# Patient Record
Sex: Female | Born: 1945
Health system: Southern US, Community
[De-identification: ages and names within clinical notes are randomized; demographics above are authoritative.]

## PROBLEM LIST (undated history)

## (undated) DIAGNOSIS — M1990 Unspecified osteoarthritis, unspecified site: Secondary | ICD-10-CM

## (undated) DIAGNOSIS — J449 Chronic obstructive pulmonary disease, unspecified: Secondary | ICD-10-CM

## (undated) DIAGNOSIS — F0150 Vascular dementia without behavioral disturbance: Secondary | ICD-10-CM

## (undated) DIAGNOSIS — I720 Aneurysm of carotid artery: Secondary | ICD-10-CM

## (undated) DIAGNOSIS — K219 Gastro-esophageal reflux disease without esophagitis: Secondary | ICD-10-CM

## (undated) DIAGNOSIS — F32A Depression, unspecified: Secondary | ICD-10-CM

## (undated) DIAGNOSIS — I639 Cerebral infarction, unspecified: Secondary | ICD-10-CM

## (undated) DIAGNOSIS — I10 Essential (primary) hypertension: Secondary | ICD-10-CM

## (undated) DIAGNOSIS — F329 Major depressive disorder, single episode, unspecified: Secondary | ICD-10-CM

## (undated) HISTORY — DX: Unspecified osteoarthritis, unspecified site: M19.90

## (undated) HISTORY — DX: Gastro-esophageal reflux disease without esophagitis: K21.9

## (undated) HISTORY — DX: Aneurysm of carotid artery: I72.0

## (undated) HISTORY — DX: Chronic obstructive pulmonary disease, unspecified: J44.9

## (undated) HISTORY — DX: Cerebral infarction, unspecified: I63.9

## (undated) HISTORY — PX: CATARACT EXTRACTION: SUR2

## (undated) HISTORY — PX: TUBAL LIGATION: SHX77

## (undated) HISTORY — PX: TONSILLECTOMY: SUR1361

## (undated) HISTORY — DX: Essential (primary) hypertension: I10

---

## 1997-05-21 ENCOUNTER — Ambulatory Visit (HOSPITAL_COMMUNITY): Admission: RE | Admit: 1997-05-21 | Discharge: 1997-05-21 | Payer: Self-pay | Admitting: *Deleted

## 1998-06-08 ENCOUNTER — Ambulatory Visit (HOSPITAL_COMMUNITY): Admission: RE | Admit: 1998-06-08 | Discharge: 1998-06-08 | Payer: Self-pay | Admitting: *Deleted

## 1998-11-07 ENCOUNTER — Ambulatory Visit (HOSPITAL_COMMUNITY): Admission: RE | Admit: 1998-11-07 | Discharge: 1998-11-07 | Payer: Self-pay | Admitting: Internal Medicine

## 1998-11-07 ENCOUNTER — Encounter: Payer: Self-pay | Admitting: Internal Medicine

## 1998-11-21 ENCOUNTER — Encounter: Payer: Self-pay | Admitting: Gastroenterology

## 1998-11-21 ENCOUNTER — Ambulatory Visit (HOSPITAL_COMMUNITY): Admission: RE | Admit: 1998-11-21 | Discharge: 1998-11-21 | Payer: Self-pay | Admitting: Gastroenterology

## 1998-12-02 ENCOUNTER — Encounter (INDEPENDENT_AMBULATORY_CARE_PROVIDER_SITE_OTHER): Payer: Self-pay | Admitting: Specialist

## 1998-12-02 ENCOUNTER — Ambulatory Visit (HOSPITAL_COMMUNITY): Admission: RE | Admit: 1998-12-02 | Discharge: 1998-12-02 | Payer: Self-pay | Admitting: Gastroenterology

## 1998-12-08 ENCOUNTER — Ambulatory Visit (HOSPITAL_COMMUNITY): Admission: RE | Admit: 1998-12-08 | Discharge: 1998-12-08 | Payer: Self-pay | Admitting: Gastroenterology

## 1998-12-08 ENCOUNTER — Encounter: Payer: Self-pay | Admitting: Gastroenterology

## 1998-12-20 ENCOUNTER — Encounter: Payer: Self-pay | Admitting: Gastroenterology

## 1998-12-20 ENCOUNTER — Ambulatory Visit (HOSPITAL_COMMUNITY): Admission: RE | Admit: 1998-12-20 | Discharge: 1998-12-20 | Payer: Self-pay | Admitting: Gastroenterology

## 1999-10-02 ENCOUNTER — Ambulatory Visit (HOSPITAL_COMMUNITY): Admission: RE | Admit: 1999-10-02 | Discharge: 1999-10-02 | Payer: Self-pay | Admitting: Gastroenterology

## 1999-10-02 ENCOUNTER — Encounter (INDEPENDENT_AMBULATORY_CARE_PROVIDER_SITE_OTHER): Payer: Self-pay

## 2000-04-01 ENCOUNTER — Inpatient Hospital Stay (HOSPITAL_COMMUNITY): Admission: EM | Admit: 2000-04-01 | Discharge: 2000-04-11 | Payer: Self-pay | Admitting: Psychiatry

## 2002-01-06 ENCOUNTER — Encounter: Payer: Self-pay | Admitting: Internal Medicine

## 2002-01-06 ENCOUNTER — Ambulatory Visit (HOSPITAL_COMMUNITY): Admission: RE | Admit: 2002-01-06 | Discharge: 2002-01-06 | Payer: Self-pay | Admitting: Internal Medicine

## 2002-03-02 ENCOUNTER — Ambulatory Visit (HOSPITAL_COMMUNITY): Admission: RE | Admit: 2002-03-02 | Discharge: 2002-03-02 | Payer: Self-pay | Admitting: Internal Medicine

## 2002-03-02 ENCOUNTER — Encounter: Payer: Self-pay | Admitting: Internal Medicine

## 2002-03-14 ENCOUNTER — Emergency Department (HOSPITAL_COMMUNITY): Admission: EM | Admit: 2002-03-14 | Discharge: 2002-03-14 | Payer: Self-pay | Admitting: Emergency Medicine

## 2002-03-14 ENCOUNTER — Encounter: Payer: Self-pay | Admitting: *Deleted

## 2002-03-26 HISTORY — PX: COLONOSCOPY: SHX174

## 2002-04-07 ENCOUNTER — Encounter (INDEPENDENT_AMBULATORY_CARE_PROVIDER_SITE_OTHER): Payer: Self-pay | Admitting: Cardiology

## 2002-04-07 ENCOUNTER — Ambulatory Visit: Admission: RE | Admit: 2002-04-07 | Discharge: 2002-04-07 | Payer: Self-pay | Admitting: Neurology

## 2002-05-26 ENCOUNTER — Inpatient Hospital Stay (HOSPITAL_COMMUNITY): Admission: EM | Admit: 2002-05-26 | Discharge: 2002-05-28 | Payer: Self-pay | Admitting: Emergency Medicine

## 2002-05-26 ENCOUNTER — Encounter: Payer: Self-pay | Admitting: *Deleted

## 2002-05-26 ENCOUNTER — Encounter: Payer: Self-pay | Admitting: Neurology

## 2002-06-02 ENCOUNTER — Encounter: Admission: RE | Admit: 2002-06-02 | Discharge: 2002-08-31 | Payer: Self-pay | Admitting: Neurology

## 2003-06-10 ENCOUNTER — Encounter: Admission: RE | Admit: 2003-06-10 | Discharge: 2003-09-08 | Payer: Self-pay | Admitting: Psychiatry

## 2004-10-30 ENCOUNTER — Ambulatory Visit (HOSPITAL_COMMUNITY): Admission: RE | Admit: 2004-10-30 | Discharge: 2004-10-30 | Payer: Self-pay | Admitting: Internal Medicine

## 2005-01-12 ENCOUNTER — Emergency Department (HOSPITAL_COMMUNITY): Admission: EM | Admit: 2005-01-12 | Discharge: 2005-01-12 | Payer: Self-pay | Admitting: Family Medicine

## 2005-06-17 ENCOUNTER — Encounter: Admission: RE | Admit: 2005-06-17 | Discharge: 2005-06-17 | Payer: Self-pay | Admitting: Neurosurgery

## 2005-06-20 ENCOUNTER — Ambulatory Visit (HOSPITAL_COMMUNITY): Admission: RE | Admit: 2005-06-20 | Discharge: 2005-06-20 | Payer: Self-pay | Admitting: Internal Medicine

## 2005-09-14 ENCOUNTER — Encounter: Admission: RE | Admit: 2005-09-14 | Discharge: 2005-09-14 | Payer: Self-pay | Admitting: Neurosurgery

## 2005-10-11 ENCOUNTER — Encounter
Admission: RE | Admit: 2005-10-11 | Discharge: 2005-11-01 | Payer: Self-pay | Admitting: Physical Medicine and Rehabilitation

## 2005-11-30 ENCOUNTER — Encounter: Admission: RE | Admit: 2005-11-30 | Discharge: 2005-12-07 | Payer: Self-pay | Admitting: Oncology

## 2005-12-03 ENCOUNTER — Encounter
Admission: RE | Admit: 2005-12-03 | Discharge: 2005-12-03 | Payer: Self-pay | Admitting: Physical Medicine and Rehabilitation

## 2006-02-08 ENCOUNTER — Ambulatory Visit (HOSPITAL_COMMUNITY): Admission: RE | Admit: 2006-02-08 | Discharge: 2006-02-08 | Payer: Self-pay | Admitting: Internal Medicine

## 2006-03-25 ENCOUNTER — Ambulatory Visit (HOSPITAL_COMMUNITY): Admission: RE | Admit: 2006-03-25 | Discharge: 2006-03-25 | Payer: Self-pay | Admitting: Internal Medicine

## 2006-06-18 ENCOUNTER — Ambulatory Visit (HOSPITAL_COMMUNITY): Admission: RE | Admit: 2006-06-18 | Discharge: 2006-06-18 | Payer: Self-pay | Admitting: Internal Medicine

## 2006-07-31 ENCOUNTER — Encounter
Admission: RE | Admit: 2006-07-31 | Discharge: 2006-07-31 | Payer: Self-pay | Admitting: Physical Medicine and Rehabilitation

## 2006-08-02 ENCOUNTER — Encounter
Admission: RE | Admit: 2006-08-02 | Discharge: 2006-08-02 | Payer: Self-pay | Admitting: Physical Medicine and Rehabilitation

## 2006-09-07 ENCOUNTER — Emergency Department (HOSPITAL_COMMUNITY): Admission: EM | Admit: 2006-09-07 | Discharge: 2006-09-07 | Payer: Self-pay | Admitting: *Deleted

## 2006-10-26 ENCOUNTER — Ambulatory Visit: Payer: Self-pay | Admitting: Internal Medicine

## 2006-10-26 ENCOUNTER — Inpatient Hospital Stay (HOSPITAL_COMMUNITY): Admission: EM | Admit: 2006-10-26 | Discharge: 2006-10-28 | Payer: Self-pay | Admitting: Emergency Medicine

## 2006-11-04 ENCOUNTER — Ambulatory Visit: Payer: Self-pay | Admitting: Pulmonary Disease

## 2006-11-05 ENCOUNTER — Ambulatory Visit: Payer: Self-pay

## 2006-12-05 ENCOUNTER — Ambulatory Visit: Payer: Self-pay | Admitting: Pulmonary Disease

## 2007-04-07 ENCOUNTER — Ambulatory Visit (HOSPITAL_COMMUNITY): Admission: RE | Admit: 2007-04-07 | Discharge: 2007-04-07 | Payer: Self-pay | Admitting: Internal Medicine

## 2007-07-18 ENCOUNTER — Ambulatory Visit: Payer: Self-pay | Admitting: Vascular Surgery

## 2007-07-18 ENCOUNTER — Ambulatory Visit: Admission: RE | Admit: 2007-07-18 | Discharge: 2007-07-18 | Payer: Self-pay | Admitting: Orthopaedic Surgery

## 2007-07-18 ENCOUNTER — Encounter (INDEPENDENT_AMBULATORY_CARE_PROVIDER_SITE_OTHER): Payer: Self-pay | Admitting: Internal Medicine

## 2008-01-08 ENCOUNTER — Encounter: Admission: RE | Admit: 2008-01-08 | Discharge: 2008-01-08 | Payer: Self-pay | Admitting: Neurology

## 2008-02-12 ENCOUNTER — Ambulatory Visit (HOSPITAL_COMMUNITY): Admission: RE | Admit: 2008-02-12 | Discharge: 2008-02-12 | Payer: Self-pay | Admitting: Internal Medicine

## 2008-03-03 ENCOUNTER — Ambulatory Visit (HOSPITAL_COMMUNITY): Admission: RE | Admit: 2008-03-03 | Discharge: 2008-03-03 | Payer: Self-pay | Admitting: Internal Medicine

## 2008-03-08 ENCOUNTER — Other Ambulatory Visit: Admission: RE | Admit: 2008-03-08 | Discharge: 2008-03-08 | Payer: Self-pay | Admitting: Internal Medicine

## 2008-04-05 ENCOUNTER — Ambulatory Visit (HOSPITAL_COMMUNITY): Admission: RE | Admit: 2008-04-05 | Discharge: 2008-04-05 | Payer: Self-pay | Admitting: Neurosurgery

## 2009-02-16 ENCOUNTER — Other Ambulatory Visit: Admission: RE | Admit: 2009-02-16 | Discharge: 2009-02-16 | Payer: Self-pay | Admitting: Internal Medicine

## 2009-07-21 ENCOUNTER — Encounter (HOSPITAL_BASED_OUTPATIENT_CLINIC_OR_DEPARTMENT_OTHER): Admission: RE | Admit: 2009-07-21 | Discharge: 2009-09-23 | Payer: Self-pay | Admitting: Internal Medicine

## 2009-07-29 ENCOUNTER — Ambulatory Visit: Payer: Self-pay | Admitting: Vascular Surgery

## 2009-08-02 ENCOUNTER — Ambulatory Visit (HOSPITAL_COMMUNITY): Admission: RE | Admit: 2009-08-02 | Discharge: 2009-08-02 | Payer: Self-pay | Admitting: Internal Medicine

## 2009-08-26 ENCOUNTER — Ambulatory Visit (HOSPITAL_COMMUNITY): Admission: RE | Admit: 2009-08-26 | Discharge: 2009-08-26 | Payer: Self-pay | Admitting: Internal Medicine

## 2009-09-19 ENCOUNTER — Ambulatory Visit (HOSPITAL_COMMUNITY): Admission: RE | Admit: 2009-09-19 | Discharge: 2009-09-19 | Payer: Self-pay | Admitting: Internal Medicine

## 2009-10-24 ENCOUNTER — Encounter (HOSPITAL_BASED_OUTPATIENT_CLINIC_OR_DEPARTMENT_OTHER): Admission: RE | Admit: 2009-10-24 | Discharge: 2010-01-22 | Payer: Self-pay | Admitting: General Surgery

## 2009-10-27 ENCOUNTER — Ambulatory Visit (HOSPITAL_COMMUNITY): Admission: RE | Admit: 2009-10-27 | Discharge: 2009-10-27 | Payer: Self-pay | Admitting: Internal Medicine

## 2009-12-28 ENCOUNTER — Ambulatory Visit (HOSPITAL_COMMUNITY): Admission: RE | Admit: 2009-12-28 | Discharge: 2009-12-28 | Payer: Self-pay | Admitting: Internal Medicine

## 2010-02-07 ENCOUNTER — Encounter (HOSPITAL_BASED_OUTPATIENT_CLINIC_OR_DEPARTMENT_OTHER)
Admission: RE | Admit: 2010-02-07 | Discharge: 2010-04-25 | Payer: Self-pay | Source: Home / Self Care | Attending: General Surgery | Admitting: General Surgery

## 2010-02-10 ENCOUNTER — Ambulatory Visit (HOSPITAL_COMMUNITY): Admission: RE | Admit: 2010-02-10 | Discharge: 2010-02-10 | Payer: Self-pay | Admitting: General Surgery

## 2010-02-24 ENCOUNTER — Ambulatory Visit (HOSPITAL_COMMUNITY)
Admission: RE | Admit: 2010-02-24 | Discharge: 2010-02-24 | Payer: Self-pay | Source: Home / Self Care | Admitting: General Surgery

## 2010-03-01 ENCOUNTER — Inpatient Hospital Stay (HOSPITAL_COMMUNITY): Admission: EM | Admit: 2010-03-01 | Discharge: 2010-03-05 | Payer: Self-pay | Source: Home / Self Care

## 2010-03-09 ENCOUNTER — Inpatient Hospital Stay (HOSPITAL_COMMUNITY)
Admission: EM | Admit: 2010-03-09 | Discharge: 2010-03-11 | Payer: Self-pay | Source: Home / Self Care | Attending: Internal Medicine | Admitting: Internal Medicine

## 2010-03-10 ENCOUNTER — Encounter (INDEPENDENT_AMBULATORY_CARE_PROVIDER_SITE_OTHER): Payer: Self-pay | Admitting: Internal Medicine

## 2010-03-14 ENCOUNTER — Ambulatory Visit (HOSPITAL_COMMUNITY): Admission: RE | Admit: 2010-03-14 | Payer: Self-pay | Source: Home / Self Care | Admitting: Orthopedic Surgery

## 2010-04-05 ENCOUNTER — Encounter
Admission: RE | Admit: 2010-04-05 | Discharge: 2010-04-25 | Payer: Self-pay | Source: Home / Self Care | Attending: Physical Medicine & Rehabilitation | Admitting: Physical Medicine & Rehabilitation

## 2010-04-07 ENCOUNTER — Ambulatory Visit: Admit: 2010-04-07 | Payer: Self-pay | Admitting: Physical Medicine & Rehabilitation

## 2010-04-17 ENCOUNTER — Encounter: Payer: Self-pay | Admitting: Neurology

## 2010-04-28 ENCOUNTER — Encounter (HOSPITAL_BASED_OUTPATIENT_CLINIC_OR_DEPARTMENT_OTHER): Payer: Medicare Other | Attending: General Surgery

## 2010-04-28 DIAGNOSIS — L97309 Non-pressure chronic ulcer of unspecified ankle with unspecified severity: Secondary | ICD-10-CM | POA: Insufficient documentation

## 2010-05-26 ENCOUNTER — Encounter (HOSPITAL_BASED_OUTPATIENT_CLINIC_OR_DEPARTMENT_OTHER): Payer: Medicare Other | Attending: General Surgery

## 2010-05-26 DIAGNOSIS — L97309 Non-pressure chronic ulcer of unspecified ankle with unspecified severity: Secondary | ICD-10-CM | POA: Insufficient documentation

## 2010-06-05 LAB — DIFFERENTIAL
Basophils Absolute: 0.1 10*3/uL (ref 0.0–0.1)
Basophils Relative: 0 % (ref 0–1)
Basophils Relative: 1 % (ref 0–1)
Eosinophils Absolute: 0.2 10*3/uL (ref 0.0–0.7)
Eosinophils Absolute: 0.4 10*3/uL (ref 0.0–0.7)
Eosinophils Relative: 1 % (ref 0–5)
Eosinophils Relative: 5 % (ref 0–5)
Eosinophils Relative: 4 % (ref 0–5)
Lymphocytes Relative: 30 % (ref 12–46)
Lymphocytes Relative: 29 % (ref 12–46)
Lymphs Abs: 1.4 10*3/uL (ref 0.7–4.0)
Monocytes Absolute: 1.3 10*3/uL — ABNORMAL HIGH (ref 0.1–1.0)
Monocytes Absolute: 1.6 10*3/uL — ABNORMAL HIGH (ref 0.1–1.0)
Monocytes Relative: 11 % (ref 3–12)
Monocytes Relative: 14 % — ABNORMAL HIGH (ref 3–12)
Neutro Abs: 4.7 10*3/uL (ref 1.7–7.7)
Neutrophils Relative %: 79 % — ABNORMAL HIGH (ref 43–77)

## 2010-06-05 LAB — BASIC METABOLIC PANEL
BUN: 3 mg/dL — ABNORMAL LOW (ref 6–23)
BUN: <1 mg/dL — ABNORMAL LOW (ref 6–23)
CO2: 29 mEq/L (ref 19–32)
Calcium: 8 mg/dL — ABNORMAL LOW (ref 8.4–10.5)
Calcium: 8 mg/dL — ABNORMAL LOW (ref 8.4–10.5)
Chloride: 104 mEq/L (ref 96–112)
Chloride: 102 mEq/L (ref 96–112)
Creatinine, Ser: 0.59 mg/dL (ref 0.4–1.2)
Creatinine, Ser: 0.66 mg/dL (ref 0.4–1.2)
GFR calc Af Amer: >60 mL/min (ref >60)
GFR calc non Af Amer: 22 mL/min — ABNORMAL LOW (ref >60)
GFR calc non Af Amer: >60 mL/min (ref >60)
Glucose, Bld: 94 mg/dL (ref 70–99)
Glucose, Bld: 98 mg/dL (ref 70–99)
Potassium: 3.2 mEq/L — ABNORMAL LOW (ref 3.5–5.1)
Sodium: 135 mEq/L (ref 135–145)
Sodium: 134 mEq/L — ABNORMAL LOW (ref 135–145)

## 2010-06-05 LAB — CK TOTAL AND CKMB (NOT AT ARMC)
Relative Index: INVALID (ref 0.0–2.5)
Total CK: 86 U/L (ref 7–177)

## 2010-06-05 LAB — CBC
HCT: 27.6 % — ABNORMAL LOW (ref 36.0–46.0)
HCT: 29.2 % — ABNORMAL LOW (ref 36.0–46.0)
Hemoglobin: 9.5 g/dL — ABNORMAL LOW (ref 12.0–15.0)
Hemoglobin: 12.1 g/dL (ref 12.0–15.0)
MCH: 31.5 pg (ref 26.0–34.0)
MCH: 31 pg (ref 26.0–34.0)
MCH: 31.3 pg (ref 26.0–34.0)
MCH: 31.5 pg (ref 26.0–34.0)
MCHC: 34.4 g/dL (ref 30.0–36.0)
MCHC: 34.5 g/dL (ref 30.0–36.0)
MCHC: 35.3 g/dL (ref 30.0–36.0)
MCHC: 34.6 g/dL (ref 30.0–36.0)
MCHC: 34.8 g/dL (ref 30.0–36.0)
MCV: 90.2 fL (ref 78.0–100.0)
MCV: 91.1 fL (ref 78.0–100.0)
MCV: 88.9 fL (ref 78.0–100.0)
MCV: 89.7 fL (ref 78.0–100.0)
MCV: 89.7 fL (ref 78.0–100.0)
Platelets: 380 10*3/uL (ref 150–400)
Platelets: 399 10*3/uL (ref 150–400)
Platelets: 404 10*3/uL — ABNORMAL HIGH (ref 150–400)
Platelets: 410 10*3/uL — ABNORMAL HIGH (ref 150–400)
Platelets: 430 10*3/uL — ABNORMAL HIGH (ref 150–400)
RBC: 3.02 MIL/uL — ABNORMAL LOW (ref 3.87–5.11)
RBC: 3.65 MIL/uL — ABNORMAL LOW (ref 3.87–5.11)
RBC: 3.78 MIL/uL — ABNORMAL LOW (ref 3.87–5.11)
RBC: 3.87 MIL/uL (ref 3.87–5.11)
RDW: 13.5 % (ref 11.5–15.5)
RDW: 14.1 % (ref 11.5–15.5)
RDW: 13.2 % (ref 11.5–15.5)
RDW: 13.3 % (ref 11.5–15.5)
RDW: 13.4 % (ref 11.5–15.5)
WBC: 14.7 10*3/uL — ABNORMAL HIGH (ref 4.0–10.5)
WBC: 9.1 10*3/uL (ref 4.0–10.5)
WBC: 10.3 10*3/uL (ref 4.0–10.5)
WBC: 6.5 10*3/uL (ref 4.0–10.5)

## 2010-06-05 LAB — CARDIAC PANEL(CRET KIN+CKTOT+MB+TROPI)
CK, MB: 5.9 ng/mL — ABNORMAL HIGH (ref 0.3–4.0)
CK, MB: 3.8 ng/mL (ref 0.3–4.0)
CK, MB: 5.3 ng/mL — ABNORMAL HIGH (ref 0.3–4.0)
Relative Index: 3.5 — ABNORMAL HIGH (ref 0.0–2.5)
Relative Index: INVALID (ref 0.0–2.5)
Total CK: 132 U/L (ref 7–177)
Total CK: 170 U/L (ref 7–177)
Troponin I: 0.02 ng/mL (ref 0.00–0.06)
Troponin I: 0.02 ng/mL (ref 0.00–0.06)

## 2010-06-05 LAB — URINALYSIS, ROUTINE W REFLEX MICROSCOPIC
Bilirubin Urine: NEGATIVE
Bilirubin Urine: NEGATIVE
Bilirubin Urine: NEGATIVE
Glucose, UA: NEGATIVE mg/dL
Glucose, UA: NEGATIVE mg/dL
Hgb urine dipstick: NEGATIVE
Hgb urine dipstick: NEGATIVE
Ketones, ur: 15 mg/dL — AB
Ketones, ur: NEGATIVE mg/dL
Nitrite: NEGATIVE
Protein, ur: NEGATIVE mg/dL
Protein, ur: NEGATIVE mg/dL
Protein, ur: NEGATIVE mg/dL
Urobilinogen, UA: 0.2 mg/dL (ref 0.0–1.0)
Urobilinogen, UA: 0.2 mg/dL (ref 0.0–1.0)

## 2010-06-05 LAB — LIPID PANEL
Cholesterol: 104 mg/dL (ref 0–200)
HDL: 65 mg/dL (ref >39)
LDL Cholesterol: 27 mg/dL (ref 0–99)
Total CHOL/HDL Ratio: 1.6 RATIO

## 2010-06-05 LAB — COMPREHENSIVE METABOLIC PANEL
ALT: 13 U/L (ref 0–35)
ALT: 10 U/L (ref 0–35)
AST: 20 U/L (ref 0–37)
AST: 23 U/L (ref 0–37)
AST: 19 U/L (ref 0–37)
AST: 24 U/L (ref 0–37)
Albumin: 2.4 g/dL — ABNORMAL LOW (ref 3.5–5.2)
Albumin: 3.2 g/dL — ABNORMAL LOW (ref 3.5–5.2)
Alkaline Phosphatase: 64 U/L (ref 39–117)
BUN: 7 mg/dL (ref 6–23)
CO2: 21 mEq/L (ref 19–32)
Calcium: 7.9 mg/dL — ABNORMAL LOW (ref 8.4–10.5)
Calcium: 8.9 mg/dL (ref 8.4–10.5)
Chloride: 94 mEq/L — ABNORMAL LOW (ref 96–112)
Creatinine, Ser: 1.91 mg/dL — ABNORMAL HIGH (ref 0.4–1.2)
Creatinine, Ser: 0.77 mg/dL (ref 0.4–1.2)
Creatinine, Ser: 1.28 mg/dL — ABNORMAL HIGH (ref 0.4–1.2)
GFR calc Af Amer: 25 mL/min — ABNORMAL LOW (ref >60)
GFR calc Af Amer: 32 mL/min — ABNORMAL LOW (ref >60)
GFR calc Af Amer: 50 mL/min — ABNORMAL LOW (ref >60)
GFR calc non Af Amer: 42 mL/min — ABNORMAL LOW (ref >60)
GFR calc non Af Amer: >60 mL/min (ref >60)
Glucose, Bld: 114 mg/dL — ABNORMAL HIGH (ref 70–99)
Potassium: 3.7 mEq/L (ref 3.5–5.1)
Potassium: 4.1 mEq/L (ref 3.5–5.1)
Sodium: 133 mEq/L — ABNORMAL LOW (ref 135–145)
Sodium: 135 mEq/L (ref 135–145)
Total Bilirubin: 0.4 mg/dL (ref 0.3–1.2)
Total Bilirubin: 0.5 mg/dL (ref 0.3–1.2)
Total Protein: 5.7 g/dL — ABNORMAL LOW (ref 6.0–8.3)

## 2010-06-05 LAB — POCT I-STAT, CHEM 8
Calcium, Ion: 1.02 mmol/L — ABNORMAL LOW (ref 1.12–1.32)
Glucose, Bld: 91 mg/dL (ref 70–99)
HCT: 38 % (ref 36.0–46.0)
Hemoglobin: 12.9 g/dL (ref 12.0–15.0)
Potassium: 3.4 mEq/L — ABNORMAL LOW (ref 3.5–5.1)
TCO2: 23 mmol/L (ref 0–100)

## 2010-06-05 LAB — MAGNESIUM
Magnesium: 1.8 mg/dL (ref 1.5–2.5)
Magnesium: 2.2 mg/dL (ref 1.5–2.5)

## 2010-06-05 LAB — URINE MICROSCOPIC-ADD ON

## 2010-06-05 LAB — LIPASE, BLOOD: Lipase: 17 U/L (ref 11–59)

## 2010-06-05 LAB — T4, FREE: Free T4: 0.73 ng/dL — ABNORMAL LOW (ref 0.80–1.80)

## 2010-06-05 LAB — GLUCOSE, CAPILLARY: Glucose-Capillary: 112 mg/dL — ABNORMAL HIGH (ref 70–99)

## 2010-06-05 LAB — PHOSPHORUS: Phosphorus: 3.7 mg/dL (ref 2.3–4.6)

## 2010-06-23 ENCOUNTER — Ambulatory Visit: Payer: Medicare Other | Admitting: Physical Medicine & Rehabilitation

## 2010-06-23 ENCOUNTER — Encounter: Payer: Medicare Other | Attending: Physical Medicine & Rehabilitation

## 2010-06-23 DIAGNOSIS — M479 Spondylosis, unspecified: Secondary | ICD-10-CM | POA: Insufficient documentation

## 2010-06-23 DIAGNOSIS — M47814 Spondylosis without myelopathy or radiculopathy, thoracic region: Secondary | ICD-10-CM

## 2010-06-23 DIAGNOSIS — F3289 Other specified depressive episodes: Secondary | ICD-10-CM | POA: Insufficient documentation

## 2010-06-23 DIAGNOSIS — M5124 Other intervertebral disc displacement, thoracic region: Secondary | ICD-10-CM | POA: Insufficient documentation

## 2010-06-23 DIAGNOSIS — M5380 Other specified dorsopathies, site unspecified: Secondary | ICD-10-CM | POA: Insufficient documentation

## 2010-06-23 DIAGNOSIS — M546 Pain in thoracic spine: Secondary | ICD-10-CM

## 2010-06-23 DIAGNOSIS — F329 Major depressive disorder, single episode, unspecified: Secondary | ICD-10-CM | POA: Insufficient documentation

## 2010-06-23 DIAGNOSIS — M549 Dorsalgia, unspecified: Secondary | ICD-10-CM | POA: Insufficient documentation

## 2010-06-23 DIAGNOSIS — Z79899 Other long term (current) drug therapy: Secondary | ICD-10-CM | POA: Insufficient documentation

## 2010-06-26 ENCOUNTER — Encounter (HOSPITAL_BASED_OUTPATIENT_CLINIC_OR_DEPARTMENT_OTHER): Payer: Medicare Other | Attending: General Surgery

## 2010-06-26 DIAGNOSIS — L97309 Non-pressure chronic ulcer of unspecified ankle with unspecified severity: Secondary | ICD-10-CM | POA: Insufficient documentation

## 2010-08-08 NOTE — H&P (Signed)
NAME:  [name], [name] NO.:  [id number]   MEDICAL RECORD NO.:  [id number]          Bol[place] TYPE:  EMS   LOCATION:  MAJO                         FACILITY:  [facility]   PHYSICIAN:  Marcellus Scott, MD     DATE OF BIRTH:  12/18/1945   DATE OF ADMISSION:  10/26/2006  DATE OF DISCHARGE:                              HISTORY & PHYSICAL   PRIMARY MEDICAL DOCTOR:  Lovenia Kim, D.O.   NEUROLOGISTMarlan Palau, M.D.   PSYCHIATRIST:  Andee Poles, M.D.   CHIEF COMPLAINT:  Chest pain.   HISTORY OF PRESENTING ILLNESS:  [name] is a pleasant 65 year old  Caucasian female Bolton with a past medical history as indicated below.  She was in her usual state of health until approximately 5 a.m. today  when chest pain woke her up.  The chest pain was fairly sudden in onset.  It is like a band across the lower chest with a pressure-like sensation.  She says it gives her a feeling of a gorilla sitting on her chest.  The  pain was 9/10 at its worst with radiation to the left side of her neck  and the left upper extremity.  The pain was also worsened by taking a  deep breath.  She had associated dyspnea and a sense of doom.  There was  no history of coughing, fever, nausea, vomiting, dizziness,  lightheadedness.  She took some tablet with no real improvement.  She  subsequently after a few hours when the pain persisted decided to call  EMS, was brought to the emergency room.  She has received IV morphine  x2.  She has received a full dose of aspirin en route to the hospital.  She says only the last 30 minutes the pain has subsided to a 6/10 but  still has ongoing chest pain.   PAST MEDICAL HISTORY:  1. Left-sided cerebrovascular accident/infarct in 2004 with no      residual deficits.  2. Hypertension.  3. Chronic obstructive pulmonary disease.  4. Gastroesophageal reflux disease.  5. Chronic low back pain.   PAST SURGICAL HISTORY:  None.   PSYCHIATRIC HISTORY:  1.  Depression.  2. Anxiety.   ALLERGIES:  The Bolton has multiple allergies:   1. AMOXICILLIN.  2. CEPHALEXIN.  3. ERYTHROMYCIN BASE.  4. LIDOCAINE.  5. PENICILLIN.  6. THIAZIDES.  7. ZITHROMAX.  8. SULFA.   CURRENT MEDICATIONS:  1. Accupril 40 mg p.o. daily.  2. Amlodipine 10 mg p.o. daily.  3. Enteric-coated aspirin 81 mg p.o. daily.  4. Boniva one tablet p.o. monthly.  5. Gabapentin 200 mg p.o. q.h.s.  6. Vicodin 7.5/500 mg one p.o. q.6h.  7. Lorazepam 0.5 mg p.o. q.i.d.  8. Plavix 75 mg p.o. daily.  9. Prevacid 30 mg p.o. daily.  10.Vesicare 10 mg p.o. daily.  11.Remeron 30 mg p.o. q.h.s.  12.Seroquel 300 mg p.o. q.h.s.   FAMILY HISTORY:  1. Mother died at 50 of a heart attack.  She also had cancer of the      lung.  2. Father died at  age 51 from alcohol and related complications,      including pancreatitis.  3. A sister at age 85 years is alive and was diagnosed with breast      cancer 6 months ago.   SOCIAL HISTORY:  The Bolton is widowed.  She works as a Corporate investment banker.  She is a current smoker of 2-3 cigarettes per day but prior  to two months she used to be a more heavy smoker.  She has done this for  decades.  Socially drinks alcohol.  No drug abuse.   REVIEW OF SYSTEMS:  There were 10 systems reviewed and noncontributory.   PHYSICAL EXAMINATION:  [name] is a moderately-built and nourished  female Bolton in obvious distress.  VITAL SIGNS:  Temperature 98.4 degrees Fahrenheit, blood pressure  105/54, which was 84/48 on arrival, pulse 106 per minute, respirations  40 per minute.  Saturating at 98% on room air.  HEAD, EYE, ENT:  Bilateral cataracts.  Nontraumatic, normocephalic.  Pupils equally reacting to light and accommodation.  Oral cavity with  missing teeth but no oropharyngeal erythema or thrush.  NECK:  Without JVD, carotid bruit, lymphadenopathy or goiter.  Supple.  LYMPHATIC:  No lymphadenopathy.  RESPIRATORY SYSTEM:  Clear to  auscultation.  No chest wall tenderness.  CARDIOVASCULAR:  First and second heart sounds are heard.  No third or  fourth heart sounds.  No murmurs, rubs, gallops or clicks.  ABDOMEN:  Nondistended, nontender, no organomegaly or mass appreciated.  Bowel sounds are normally heard.  CENTRAL NERVOUS SYSTEM:  The Bolton is awake, alert and oriented x3  with no focal neurologic deficits.  EXTREMITIES:  Peripheral pulses symmetric.  There is no cyanosis,  clubbing or edema.  Peripheries are warm.  SKIN:  Without any rashes.   LAB RESULTS:  Point of care cardiac markers x2 negative.  D-dimer  negative at 0.45.  An I-STAT with pH of 7.406, pCO2 36.8, bicarb 28,  hemoglobin 14.3, hematocrit 42, sodium 133, potassium 3.8, chloride 103,  glucose of 161, BUN of 8.   EKG with sinus tachycardia at 104 beats per minute.  Otherwise no acute  changes.   A chest x-ray which has been removed by me and reported as no acute  cardiopulmonary findings.   ASSESSMENT AND PLAN:  1. Chest pain/unstable angina in a middle-aged female Bolton with      risk factors of hypertension, current smoking, prior      cerebrovascular accident, and a family history positive for      myocardial infarction.  We will admit the Bolton to a step-down      unit.  We will cycle the Bolton's cardiac enzymes.  We will place      the Bolton on oxygen and continue aspirin and Plavix, IV      nitroglycerin drip and unfractionated heparin.  Will use beta      blockers if blood pressure tolerates.  Cardiology has been      consulted.  2. Hypertension.  She had low-ish blood pressure on arrival.  We will      hold her Norvasc at this time and continue other medications.  3. History of cerebrovascular accident.  Will continue aspirin and      Plavix.  4. Chronic obstructive pulmonary disease, stable.  5. Tobacco abuse.  We will obtain a tobacco cessation consult.      Bolton not desirous of nicotine patch.      Marcellus Scott, MD  Electronically  Signed     AH/MEDQ  D:  10/26/2006  T:  10/26/2006  Job:  [id number]   cc:   Lovenia Kim, D.O.  Marlan Palau, M.D.  Andee Poles, M.D.

## 2010-08-08 NOTE — Consult Note (Signed)
[name], [name] NO.:  [id number]   MEDICAL RECORD NO.:  [id number]          PATIENT TYPE:  INP   LOCATION:  4708                         FACILITY:  [facility]   PHYSICIAN:  Doylene Canning. Ladona Ridgel, MD    DATE OF BIRTH:  Nov 21, 1945   DATE OF CONSULTATION:  10/26/2006  DATE OF DISCHARGE:                                 CONSULTATION   REQUESTING PHYSICIAN:  Dr. Donnetta Hutching.   INDICATION FOR CONSULTATION:  Evaluation of chest pain.   HISTORY OF PRESENT ILLNESS:  The patient is a 65 year old woman with  multiple medical problems including hypertension, a history of a stroke,  depression, anxiety and a history of gastroesophageal reflux disease.  Her history dates back approximately 1 month ago when she had  unexplained nausea, which eventually resolved.  The etiology of this was  unknown.  The patient states that she woke up this morning with  substernal chest pain and shortness of breath.  There was no nausea.  There is no vomiting.  There was radiation of pain to the left neck.  It  did not go to the arm or the shoulder.  She described her pain as being  like a band around my chest, but she subsequently called EMS and was  given nitroglycerin which did not help her symptoms.  Ultimately,  morphine and Zofran, in conjunction with nitroglycerin in the emergency  room, reduced her pain.  She states that the pain was worse with deep  inspiration and with certain movements, specifically if she moved  forward.  There was nothing that she could do, to make her pain  relieved.  She is admitted for additional evaluation.  She has had no  syncope.  She denies palpitations.   PAST MEDICAL HISTORY:  Is notable for hypertension, stroke and  gastroesophageal reflux disease, depression and a history of  hemorrhoids.  She also had a history of migraine headaches.   MEDICATIONS:  Include amlodipine 10 a day, aspirin 81 a day, Boniva,  gabapentin 100, two tablets in the evening;  hydrocodone, Lorazepam,  Mirtazapine, Plavix 75 a day, Prevacid 30, quinapril 40, Seroquel and  Vesicare.   FAMILY HISTORY:  Notable for mother dying of coronary disease in her 76s  and father dying of complications of alcohol and pancreatitis.   SOCIAL HISTORY:  The patient is widowed and a smoker.  She has stopped  smoking for awhile but now has started back.  She does note that she  drinks one alcoholic beverage per day on average.   REVIEW OF SYSTEMS:  The rest of review of systems was notable for  anxiety, as previously noted with depression.  She has chronic back  pain.  She has chronic reflux symptoms.  She has had vision loss.   PHYSICAL EXAMINATION:  GENERAL:  On the physical exam, she is a pleasant  middle-aged woman in no acute distress.  VITAL SIGNS:  The temperature was 101.4, blood pressure was 90/50, pulse  was 81 and regular, the respirations were 24.  The patient was on well-  appearing in middle-aged woman  in no acute distress.  HEENT:  Normocephalic, atraumatic.  Pupils equal, round.  Oropharynx was  moist.  Sclerae anicteric.  The patient did wear glasses.  NECK:  There  is a very soft bruit on the right, otherwise the neck exam was normal.  CARDIAC:  Revealed a regular rate and rhythm with normal S1-S2.  There  are no murmurs, rubs, gallops appreciated.  PMI was not enlarged, nor  was it laterally displaced.  LUNGS:  Clear bilaterally auscultation, except for rales in bases  bilaterally.  SKIN:  Exam was normal.  NEUROLOGIC:  Alert and oriented x3, cranial nerves intact.  The strength  is 5/5 and symmetric.  I could not detect any weakness in her right  hand.   LABORATORY:  EKG demonstrates sinus tachycardia with nonspecific T-wave  abnormality.  Hemoglobin was 14.  The rest of her labs were normal.  Her  point care markers were negative.  Her D-dimer was 0.45.  EKG  demonstrates sinus rhythm with no acute ST-T wave abnormalities.   IMPRESSION:  1. Atypical  chest pain with all ongoing symptoms.  He had no EKG or      enzyme abnormalities.  2. Hypertension.  3. History of stroke.  4. Chronic obstructive pulmonary disease.  5. Tobacco abuse.   DISCUSSION:  While the patient has multiple cardiac risk factors, her  symptoms are very atypical, and she has continued have pain with no EKG  changes or enzyme abnormalities, making coronary ischemia very unlikely.  For now, I recommended that she be given morphine and other narcotics as  needed, as well as NSAIDs to help control her pain.  Outpatient stress  test will be a consideration, unless her cardiac enzymes were positive.  Also, would consider ABG make sure she is not having evidence of  hypoxemia.  Will plan to watch the patient with you on telemetry.      Doylene Canning. Ladona Ridgel, MD  Electronically Signed     GWT/MEDQ  D:  10/26/2006  T:  10/27/2006  Job:  [id number]   cc:   Lovenia Kim, D.O.

## 2010-08-08 NOTE — Discharge Summary (Signed)
[name], [name] NO.:  [id number]   MEDICAL RECORD NO.:  [id number]          PATIENT TYPE:  INP   LOCATION:  4708                         FACILITY:  [facility]   PHYSICIAN:  Lonia Blood, M.D.       DATE OF BIRTH:  Feb 05, 1946   DATE OF ADMISSION:  10/26/2006  DATE OF DISCHARGE:  10/28/2006                               DISCHARGE SUMMARY   PRIMARY CARE PHYSICIAN:  Dr. Marisue Brooklyn.   DISCHARGE DIAGNOSES:  1. Chest pain - unclear etiology - resolving.  2. Hypertension.  3. Minor stroke,  4. Gastroesophageal reflux disease.  5. Depression and anxiety.  6. Migraine headaches.  7. Probable pneumonitis - question chemical in nature.   DISCHARGE MEDICATIONS:  1. Prevacid 30 mg daily.  2. Quinapril 40 mg daily.  3. Seroquel 100 mg at bedtime.  4. Lorazepam 0.5 mg 2 times a day.  5. Remeron 30 mg at bedtime.  6. Aspirin 81 mg daily.  7. Plavix 75 mg daily.  8. Neurontin 100 mg at bedtime.  9. Avelox 400 mg for 5 days.  10.Mobic 7.5 mg for 10 days.  11.Hydrocodone as needed for pain.   CONDITION AT DISCHARGE:  [name] was discharged in good condition.  At the time of discharge, the patient was set up for an outpatient  stress test with the [facility].  The patient was also  told to follow up with her primary care physician, Dr. Marisue Brooklyn.   PROCEDURES DURING THIS ADMISSION:  October 26, 2006, the patient underwent  a portable chest x-ray.  Underwent portable chest x-ray with no acute  cardiopulmonary findings.   CONSULTATIONS:  The patient was seen in consultation by Dr. Ladona Ridgel from  cardiology.   HOSPITAL COURSE:  Chest pain.  [name] presented to the emergency  room with complaints of chest pain, which initially sounded like clear  cut angina.  After further evaluation, the patient's chest pain was  classified as atypical.  Her first set of cardiac enzymes were normal  and initially the patient was observed on heparin drip and  nitroglycerin  drip in the CCU.  As it became clearer that the patient's chest pain was  noncardiac in nature, [name] was switched to nonsteroidal  antiinflammatory drugs and her heparin and nitroglycerin drip were  discontinued.  [name]'s chest pain was not fully explained, but it  could have been related to chemical pneumonitis that the patient  suffered after spray painting her bowls.  We did treat with antibiotics  and nonsteroidal antiinflammatory drugs and the patient's chest pain  improved and her white blood cell count normalized to 9.1.  Repeat D-  dimer testing was within normal limits.  The patient was set up for an  outpatient cardiac stress test and was provided with a prescription for  antibiotics and nonsteroidal antiinflammatory drugs and was instructed  to follow up with her primary care physician.      Lonia Blood, M.D.  Electronically Signed     SL/MEDQ  D:  10/29/2006  T:  10/29/2006  Job:  [id number]   cc:   Lovenia Kim, D.O.

## 2010-08-08 NOTE — Letter (Signed)
November 04, 2006    Lovenia Kim, D.O.  [address]  [place], [place] [address]   RE:  [name], [name]  MRN:  [id number]  /  DOB:  January 19, 1946   Dear Dr. Elisabeth Bolton:   As you are aware, [name] was recently hospitalized for acute onset  chest pain.  Three sets of cardiac enzymes were normal.  A chest x-ray  did not show any infiltrates or effusions.  It was felt that her pain  may have been due to chemical pneumonitis; hence, she was referred to  [place].   She describes the pain as substernal, acute constricting kind, like a  gorilla sitting over her chest, that woke her up from sleep.  Aspirin  and nitroglycerin did not seem to relieve this.  Multiple doses of  morphine did not offer any relief and nonsteroidals seemed to help  somewhat.  She states she is now 50% better and is scheduled for a  nuclear stress test tomorrow.  She describes dyspnea associated with the  chest pain which has still not improved.  She report some dyspnea on  walking her dog, denies cough, sputum production and occasional wheezing  with chest colds.   PAST MEDICAL HISTORY:  1. Hypertension.  2. Hyperlipidemia.  3. CVA with no residual deficits (Dr. Anne Hahn).  A recent EEG and MRI      has been done.  4. GERD.  5. Depression.  6. Anxiety (Dr. Nolen Mu).   PAST SURGICAL HISTORY:  None.   ALLERGIES:  None.   SOCIAL HISTORY:  1. She works as a Consulting civil engineer at [employer].  2. She lives with herself with a dog at home.  3. She smokes about 5 cigarettes a day and quit about a year and half      on Wellbutrin but relapsed about a month ago, total of about 30      pack years.  4. She drinks a beer a day.   FAMILY HISTORY:  1. Heart disease in the mother.  2. Lung cancer in her mother.  3. Pancreatic cancer in her father.   REVIEW OF SYSTEMS:  Reports occasional heartburn relieved by Prevacid,  depressive symptoms, shortness of breath with activity.   PHYSICAL EXAM:  Height 5  feet 3 inches, weight 128 pounds, temperature  98.2, blood pressure 112/70, heart rate 80 per minute, oxygen saturation  97% on room air.   CURRENT MEDICATIONS:  1. Accupril 40 mg daily.  2. VESIcare 10 mg daily.  3. Amlodipine 10 mg daily.  4. Plavix 75 mg daily.  5. Gabapentin 100 mg p.o. b.i.d.  6. Senokot 100 mg three tablets daily.  7. Mirtazapine 30 mg q.h.s.  8. Prevacid 30 mg daily.  9. Lorazepam 0.5 mg four times a day.   PHYSICAL EXAM:  HEENT:  No postnasal drip.  NECK:  Supple, no JVD, no lymphadenopathy.  CVS:  S1 S2.  CHEST:  Normal, decreased breath sounds in both bases.  Coarse breath  sounds, no rhonchi.  ABDOMEN:  Soft, nontender.  NEUROLOGICALLY:  Nonfocal.  EXTREMITIES:  No edema.   IMPRESSION:  1. Likely chronic obstructive pulmonary disease in this active smoker.  2. I doubt that the chest pain was related to a pulmonary condition.      She does not seem to have had an asthma exacerbation.  I think a      cardiac etiology is Bolton likely and a stress test has been  scheduled for risk stratification tomorrow.  If her test is      negative I would consider obtaining a spiral CT to rule out      pulmonary embolism.  I do note that the D-dimer testing was within      normal limits during her hospital admission.  3. Spirometry will be obtained on future visits.  4. Smoking cessation was discussed extensively.  She will consult with      Dr. Nolen Mu regarding restarting Wellbutrin since this seems to      have worked before.  I advised her against using a nicotine patch      while her cardiac workup was in progress.    Sincerely,      Oretha Milch, MD  Electronically Signed    RVA/MedQ  DD: 11/04/2006  DT: 11/05/2006  Job #: [id number]   CC:    Doylene Canning. Ladona Ridgel, MD

## 2010-08-08 NOTE — Procedures (Signed)
DUPLEX DEEP VENOUS EXAM - LOWER EXTREMITY   INDICATION:  Questionable venous insufficiency.   HISTORY:  Edema:  No.  Trauma/Surgery:  No.  Pain:  No.  PE:  No.  Previous DVT:  No.  Anticoagulants:  No.  Other:  Right leg ulcer.   DUPLEX EXAM:                CFV   SFV   PopV  PTV    GSV                R  L  R  L  R  L  R   L  R  L  Thrombosis    o  o  o  o  o  o  o   o  o  o  Spontaneous   +  +  +  +  +  +  +   +  +  +  Phasic        +  +  +  +  +  +  +   +  +  +  Augmentation  +  +  +  +  +  +  +   +  +  +  Compressible  +  +  +  +  +  +  +   +  +  +  Competent     +  +  +  +  +  +  +   +  +  +   Legend:  + - yes  o - no  p - partial  D - decreased   IMPRESSION:  1. There did not appear to be any deep venous thrombus noted in      bilateral legs.  2. There is no evidence of venous insufficiency in bilateral legs.    _____________________________  Larina Earthly, M.D.   CB/MEDQ  D:  07/29/2009  T:  07/29/2009  Job:  [id number]

## 2010-08-11 NOTE — Procedures (Signed)
[facility]  Patient:    [name], [name]                        MRN: [id number] Proc. Date: 10/02/99 Attending:  Fayrene Fearing L. Randa Evens, M.D. CC:         Nicky Pugh. Lenon Ahmadi, M.D.                           Procedure Report  PROCEDURE:  Colonoscopy with biopsy.  MEDICATIONS:  Fentanyl 125 mcg, versed 10 mg IV.  SCOPE:  Olympus pediatric video colonoscope.  INDICATIONS FOR PROCEDURE:  Heme positive stool and diarrhea.  DESCRIPTION OF PROCEDURE:  The procedure had been explained to the patient and consent obtained. With the patient in the left lateral decubitus position, the pediatric video colonoscope was inserted and advanced under direct vision. The prep was quite good. We were able to advance to the cecum. The terminal ileum was seen and was entered for a short distance and was normal. The scope was withdrawn and the colonic mucosa carefully examined upon withdrawal. No polyps were seen throughout the entire colon. The mucosa appeared normal with a good vascular pattern. Several biopsies were obtained. No other gross lesions were seen in the colon. The scope was withdrawn. The patient tolerated the procedure well.  ASSESSMENT: 1. Heme positive stool with no obvious explanation other than small internal    hemorrhoids. 2. Diarrhea cause questionable. We have taken biopsies to rule out collagenous    colitis. 3. Small internal hemorrhoids.  PLAN:  Will check path and will follow-up in the office in 6-8 weeks. DD:  10/02/99 TD:  10/02/99 Job: [id number]

## 2010-09-07 ENCOUNTER — Other Ambulatory Visit (HOSPITAL_COMMUNITY): Payer: Self-pay | Admitting: Internal Medicine

## 2010-09-07 DIAGNOSIS — Z1231 Encounter for screening mammogram for malignant neoplasm of breast: Secondary | ICD-10-CM

## 2010-09-19 ENCOUNTER — Ambulatory Visit (HOSPITAL_COMMUNITY)
Admission: RE | Admit: 2010-09-19 | Discharge: 2010-09-19 | Disposition: A | Discharge status: Home / Self Care | Payer: Medicare Other | Source: Ambulatory Visit | Attending: Internal Medicine | Admitting: Internal Medicine

## 2010-09-19 DIAGNOSIS — Z1231 Encounter for screening mammogram for malignant neoplasm of breast: Secondary | ICD-10-CM | POA: Insufficient documentation

## 2010-10-11 ENCOUNTER — Other Ambulatory Visit (HOSPITAL_COMMUNITY): Payer: Self-pay | Admitting: Internal Medicine

## 2010-10-11 ENCOUNTER — Ambulatory Visit (HOSPITAL_COMMUNITY)
Admission: RE | Admit: 2010-10-11 | Discharge: 2010-10-11 | Disposition: A | Discharge status: Home / Self Care | Payer: Medicare Other | Source: Ambulatory Visit | Attending: Internal Medicine | Admitting: Internal Medicine

## 2010-10-11 DIAGNOSIS — S22009A Unspecified fracture of unspecified thoracic vertebra, initial encounter for closed fracture: Secondary | ICD-10-CM | POA: Insufficient documentation

## 2010-10-11 DIAGNOSIS — R071 Chest pain on breathing: Secondary | ICD-10-CM

## 2010-10-11 DIAGNOSIS — J449 Chronic obstructive pulmonary disease, unspecified: Secondary | ICD-10-CM | POA: Insufficient documentation

## 2010-10-11 DIAGNOSIS — I10 Essential (primary) hypertension: Secondary | ICD-10-CM | POA: Insufficient documentation

## 2010-10-11 DIAGNOSIS — J4489 Other specified chronic obstructive pulmonary disease: Secondary | ICD-10-CM | POA: Insufficient documentation

## 2010-10-11 DIAGNOSIS — M549 Dorsalgia, unspecified: Secondary | ICD-10-CM

## 2011-01-08 LAB — URINALYSIS, ROUTINE W REFLEX MICROSCOPIC
Ketones, ur: NEGATIVE
Nitrite: NEGATIVE
Protein, ur: NEGATIVE
pH: 5.5

## 2011-01-08 LAB — COMPREHENSIVE METABOLIC PANEL
ALT: 8
Alkaline Phosphatase: 53
BUN: 8
CO2: 26
Chloride: 105
GFR calc non Af Amer: 55 — ABNORMAL LOW
Glucose, Bld: 110 — ABNORMAL HIGH
Potassium: 4.3
Total Bilirubin: 0.8

## 2011-01-08 LAB — CBC
HCT: 37
Hemoglobin: 12.6
MCHC: 34.1
MCV: 92.9
Platelets: 299
RDW: 12.5
WBC: 9.1

## 2011-01-08 LAB — I-STAT 8, (EC8 V) (CONVERTED LAB)
Acid-base deficit: 1
Chloride: 102
Hemoglobin: 14.3
Potassium: 3.8
pH, Ven: 7.406 — ABNORMAL HIGH

## 2011-01-08 LAB — CARDIAC PANEL(CRET KIN+CKTOT+MB+TROPI)
CK, MB: 1.3
Relative Index: INVALID
Total CK: 33
Troponin I: 0.01

## 2011-01-08 LAB — D-DIMER, QUANTITATIVE
D-Dimer, Quant: 0.37
D-Dimer, Quant: 0.45

## 2011-01-08 LAB — HEMOGLOBIN A1C: Mean Plasma Glucose: 122

## 2011-01-08 LAB — LIPID PANEL
HDL: 58
Triglycerides: 116
VLDL: 23

## 2011-01-08 LAB — DIFFERENTIAL
Basophils Absolute: 0.1
Basophils Relative: 0
Eosinophils Absolute: 0
Neutro Abs: 12.8 — ABNORMAL HIGH
Neutrophils Relative %: 80 — ABNORMAL HIGH

## 2011-01-08 LAB — POCT CARDIAC MARKERS
CKMB, poc: <1 — ABNORMAL LOW
Troponin i, poc: <0.05

## 2011-01-08 LAB — BASIC METABOLIC PANEL
BUN: 7
Creatinine, Ser: 0.9
GFR calc non Af Amer: >60

## 2011-01-08 LAB — CK TOTAL AND CKMB (NOT AT ARMC): Total CK: 48

## 2011-01-08 LAB — PROTIME-INR: Prothrombin Time: 13.5

## 2011-04-03 DIAGNOSIS — C44721 Squamous cell carcinoma of skin of unspecified lower limb, including hip: Secondary | ICD-10-CM | POA: Diagnosis not present

## 2011-04-06 DIAGNOSIS — I10 Essential (primary) hypertension: Secondary | ICD-10-CM | POA: Diagnosis not present

## 2011-04-06 DIAGNOSIS — M545 Low back pain: Secondary | ICD-10-CM | POA: Diagnosis not present

## 2011-04-18 DIAGNOSIS — E559 Vitamin D deficiency, unspecified: Secondary | ICD-10-CM | POA: Diagnosis not present

## 2011-04-18 DIAGNOSIS — I10 Essential (primary) hypertension: Secondary | ICD-10-CM | POA: Diagnosis not present

## 2011-04-18 DIAGNOSIS — R7309 Other abnormal glucose: Secondary | ICD-10-CM | POA: Diagnosis not present

## 2011-04-18 DIAGNOSIS — Z23 Encounter for immunization: Secondary | ICD-10-CM | POA: Diagnosis not present

## 2011-04-18 DIAGNOSIS — Z79899 Other long term (current) drug therapy: Secondary | ICD-10-CM | POA: Diagnosis not present

## 2011-04-18 DIAGNOSIS — E782 Mixed hyperlipidemia: Secondary | ICD-10-CM | POA: Diagnosis not present

## 2011-05-08 DIAGNOSIS — C44711 Basal cell carcinoma of skin of unspecified lower limb, including hip: Secondary | ICD-10-CM | POA: Diagnosis not present

## 2011-05-08 DIAGNOSIS — L570 Actinic keratosis: Secondary | ICD-10-CM | POA: Diagnosis not present

## 2011-05-08 DIAGNOSIS — L821 Other seborrheic keratosis: Secondary | ICD-10-CM | POA: Diagnosis not present

## 2011-05-08 DIAGNOSIS — Z85828 Personal history of other malignant neoplasm of skin: Secondary | ICD-10-CM | POA: Diagnosis not present

## 2011-05-14 DIAGNOSIS — J209 Acute bronchitis, unspecified: Secondary | ICD-10-CM | POA: Diagnosis not present

## 2011-05-17 DIAGNOSIS — M79609 Pain in unspecified limb: Secondary | ICD-10-CM | POA: Diagnosis not present

## 2011-05-17 DIAGNOSIS — M2040 Other hammer toe(s) (acquired), unspecified foot: Secondary | ICD-10-CM | POA: Diagnosis not present

## 2011-05-17 DIAGNOSIS — B351 Tinea unguium: Secondary | ICD-10-CM | POA: Diagnosis not present

## 2011-06-04 DIAGNOSIS — M549 Dorsalgia, unspecified: Secondary | ICD-10-CM | POA: Diagnosis not present

## 2011-06-19 DIAGNOSIS — Z85828 Personal history of other malignant neoplasm of skin: Secondary | ICD-10-CM | POA: Diagnosis not present

## 2011-06-19 DIAGNOSIS — M47814 Spondylosis without myelopathy or radiculopathy, thoracic region: Secondary | ICD-10-CM | POA: Diagnosis not present

## 2011-06-19 DIAGNOSIS — L570 Actinic keratosis: Secondary | ICD-10-CM | POA: Diagnosis not present

## 2011-07-11 DIAGNOSIS — L851 Acquired keratosis [keratoderma] palmaris et plantaris: Secondary | ICD-10-CM | POA: Diagnosis not present

## 2011-07-11 DIAGNOSIS — M2040 Other hammer toe(s) (acquired), unspecified foot: Secondary | ICD-10-CM | POA: Diagnosis not present

## 2011-07-13 DIAGNOSIS — F17200 Nicotine dependence, unspecified, uncomplicated: Secondary | ICD-10-CM | POA: Diagnosis not present

## 2011-07-13 DIAGNOSIS — M549 Dorsalgia, unspecified: Secondary | ICD-10-CM | POA: Diagnosis not present

## 2011-07-16 DIAGNOSIS — Z79899 Other long term (current) drug therapy: Secondary | ICD-10-CM | POA: Diagnosis not present

## 2011-07-16 DIAGNOSIS — R7309 Other abnormal glucose: Secondary | ICD-10-CM | POA: Diagnosis not present

## 2011-07-16 DIAGNOSIS — I10 Essential (primary) hypertension: Secondary | ICD-10-CM | POA: Diagnosis not present

## 2011-07-16 DIAGNOSIS — E782 Mixed hyperlipidemia: Secondary | ICD-10-CM | POA: Diagnosis not present

## 2011-07-16 DIAGNOSIS — E559 Vitamin D deficiency, unspecified: Secondary | ICD-10-CM | POA: Diagnosis not present

## 2011-07-20 DIAGNOSIS — M533 Sacrococcygeal disorders, not elsewhere classified: Secondary | ICD-10-CM | POA: Diagnosis not present

## 2011-07-31 DIAGNOSIS — M2040 Other hammer toe(s) (acquired), unspecified foot: Secondary | ICD-10-CM | POA: Diagnosis not present

## 2011-08-17 DIAGNOSIS — M2040 Other hammer toe(s) (acquired), unspecified foot: Secondary | ICD-10-CM | POA: Diagnosis not present

## 2011-08-17 DIAGNOSIS — D492 Neoplasm of unspecified behavior of bone, soft tissue, and skin: Secondary | ICD-10-CM | POA: Diagnosis not present

## 2011-08-23 DIAGNOSIS — M779 Enthesopathy, unspecified: Secondary | ICD-10-CM | POA: Diagnosis not present

## 2011-08-28 DIAGNOSIS — M2040 Other hammer toe(s) (acquired), unspecified foot: Secondary | ICD-10-CM | POA: Diagnosis not present

## 2011-09-05 DIAGNOSIS — R5383 Other fatigue: Secondary | ICD-10-CM | POA: Diagnosis not present

## 2011-09-05 DIAGNOSIS — I10 Essential (primary) hypertension: Secondary | ICD-10-CM | POA: Diagnosis not present

## 2011-09-05 DIAGNOSIS — R634 Abnormal weight loss: Secondary | ICD-10-CM | POA: Diagnosis not present

## 2011-09-05 DIAGNOSIS — R7309 Other abnormal glucose: Secondary | ICD-10-CM | POA: Diagnosis not present

## 2011-09-05 DIAGNOSIS — J449 Chronic obstructive pulmonary disease, unspecified: Secondary | ICD-10-CM | POA: Diagnosis not present

## 2011-09-05 DIAGNOSIS — Z79899 Other long term (current) drug therapy: Secondary | ICD-10-CM | POA: Diagnosis not present

## 2011-09-19 DIAGNOSIS — I10 Essential (primary) hypertension: Secondary | ICD-10-CM | POA: Diagnosis not present

## 2011-09-19 DIAGNOSIS — J449 Chronic obstructive pulmonary disease, unspecified: Secondary | ICD-10-CM | POA: Diagnosis not present

## 2011-09-24 ENCOUNTER — Other Ambulatory Visit (HOSPITAL_COMMUNITY): Payer: Self-pay | Admitting: Internal Medicine

## 2011-09-24 DIAGNOSIS — IMO0001 Reserved for inherently not codable concepts without codable children: Secondary | ICD-10-CM | POA: Diagnosis not present

## 2011-09-24 DIAGNOSIS — M549 Dorsalgia, unspecified: Secondary | ICD-10-CM | POA: Diagnosis not present

## 2011-09-24 DIAGNOSIS — Z1231 Encounter for screening mammogram for malignant neoplasm of breast: Secondary | ICD-10-CM

## 2011-09-24 DIAGNOSIS — F17200 Nicotine dependence, unspecified, uncomplicated: Secondary | ICD-10-CM | POA: Diagnosis not present

## 2011-10-03 DIAGNOSIS — L84 Corns and callosities: Secondary | ICD-10-CM | POA: Diagnosis not present

## 2011-10-10 DIAGNOSIS — R197 Diarrhea, unspecified: Secondary | ICD-10-CM | POA: Diagnosis not present

## 2011-10-10 DIAGNOSIS — N3000 Acute cystitis without hematuria: Secondary | ICD-10-CM | POA: Diagnosis not present

## 2011-10-16 ENCOUNTER — Other Ambulatory Visit (HOSPITAL_COMMUNITY): Payer: Self-pay | Admitting: Internal Medicine

## 2011-10-16 ENCOUNTER — Ambulatory Visit (HOSPITAL_COMMUNITY)
Admission: RE | Admit: 2011-10-16 | Discharge: 2011-10-16 | Disposition: A | Discharge status: Home / Self Care | Payer: Medicare Other | Source: Ambulatory Visit | Attending: Internal Medicine | Admitting: Internal Medicine

## 2011-10-16 DIAGNOSIS — R059 Cough, unspecified: Secondary | ICD-10-CM | POA: Insufficient documentation

## 2011-10-16 DIAGNOSIS — R05 Cough: Secondary | ICD-10-CM | POA: Diagnosis not present

## 2011-10-16 DIAGNOSIS — F17200 Nicotine dependence, unspecified, uncomplicated: Secondary | ICD-10-CM | POA: Diagnosis not present

## 2011-10-16 DIAGNOSIS — Z1231 Encounter for screening mammogram for malignant neoplasm of breast: Secondary | ICD-10-CM

## 2011-10-23 DIAGNOSIS — Q828 Other specified congenital malformations of skin: Secondary | ICD-10-CM | POA: Diagnosis not present

## 2011-11-02 DIAGNOSIS — D692 Other nonthrombocytopenic purpura: Secondary | ICD-10-CM | POA: Diagnosis not present

## 2011-11-02 DIAGNOSIS — D239 Other benign neoplasm of skin, unspecified: Secondary | ICD-10-CM | POA: Diagnosis not present

## 2011-11-02 DIAGNOSIS — C44721 Squamous cell carcinoma of skin of unspecified lower limb, including hip: Secondary | ICD-10-CM | POA: Diagnosis not present

## 2011-11-02 DIAGNOSIS — L570 Actinic keratosis: Secondary | ICD-10-CM | POA: Diagnosis not present

## 2011-11-13 DIAGNOSIS — M79609 Pain in unspecified limb: Secondary | ICD-10-CM | POA: Diagnosis not present

## 2011-11-13 DIAGNOSIS — B351 Tinea unguium: Secondary | ICD-10-CM | POA: Diagnosis not present

## 2011-11-13 DIAGNOSIS — Q828 Other specified congenital malformations of skin: Secondary | ICD-10-CM | POA: Diagnosis not present

## 2011-11-14 DIAGNOSIS — C44721 Squamous cell carcinoma of skin of unspecified lower limb, including hip: Secondary | ICD-10-CM | POA: Diagnosis not present

## 2011-12-18 DIAGNOSIS — B351 Tinea unguium: Secondary | ICD-10-CM | POA: Diagnosis not present

## 2011-12-18 DIAGNOSIS — M79609 Pain in unspecified limb: Secondary | ICD-10-CM | POA: Diagnosis not present

## 2011-12-20 DIAGNOSIS — Z23 Encounter for immunization: Secondary | ICD-10-CM | POA: Diagnosis not present

## 2011-12-20 DIAGNOSIS — Z79899 Other long term (current) drug therapy: Secondary | ICD-10-CM | POA: Diagnosis not present

## 2011-12-20 DIAGNOSIS — I10 Essential (primary) hypertension: Secondary | ICD-10-CM | POA: Diagnosis not present

## 2011-12-20 DIAGNOSIS — E782 Mixed hyperlipidemia: Secondary | ICD-10-CM | POA: Diagnosis not present

## 2011-12-20 DIAGNOSIS — R7309 Other abnormal glucose: Secondary | ICD-10-CM | POA: Diagnosis not present

## 2011-12-20 DIAGNOSIS — E559 Vitamin D deficiency, unspecified: Secondary | ICD-10-CM | POA: Diagnosis not present

## 2011-12-25 ENCOUNTER — Ambulatory Visit (HOSPITAL_COMMUNITY)
Admission: RE | Admit: 2011-12-25 | Discharge: 2011-12-25 | Disposition: A | Discharge status: Home / Self Care | Payer: Medicare Other | Source: Ambulatory Visit | Attending: Internal Medicine | Admitting: Internal Medicine

## 2011-12-25 ENCOUNTER — Other Ambulatory Visit (HOSPITAL_COMMUNITY): Payer: Self-pay | Admitting: Internal Medicine

## 2011-12-25 DIAGNOSIS — R072 Precordial pain: Secondary | ICD-10-CM

## 2011-12-25 DIAGNOSIS — R079 Chest pain, unspecified: Secondary | ICD-10-CM | POA: Diagnosis not present

## 2011-12-25 DIAGNOSIS — S298XXA Other specified injuries of thorax, initial encounter: Secondary | ICD-10-CM | POA: Diagnosis not present

## 2011-12-25 DIAGNOSIS — S20219A Contusion of unspecified front wall of thorax, initial encounter: Secondary | ICD-10-CM | POA: Diagnosis not present

## 2012-01-08 DIAGNOSIS — M2040 Other hammer toe(s) (acquired), unspecified foot: Secondary | ICD-10-CM | POA: Diagnosis not present

## 2012-01-11 DIAGNOSIS — S20219A Contusion of unspecified front wall of thorax, initial encounter: Secondary | ICD-10-CM | POA: Diagnosis not present

## 2012-01-14 ENCOUNTER — Other Ambulatory Visit (HOSPITAL_COMMUNITY): Payer: Self-pay | Admitting: Internal Medicine

## 2012-01-14 DIAGNOSIS — K469 Unspecified abdominal hernia without obstruction or gangrene: Secondary | ICD-10-CM

## 2012-01-15 DIAGNOSIS — IMO0001 Reserved for inherently not codable concepts without codable children: Secondary | ICD-10-CM | POA: Diagnosis not present

## 2012-01-15 DIAGNOSIS — M549 Dorsalgia, unspecified: Secondary | ICD-10-CM | POA: Diagnosis not present

## 2012-01-16 ENCOUNTER — Ambulatory Visit (HOSPITAL_COMMUNITY)
Admission: RE | Admit: 2012-01-16 | Discharge: 2012-01-16 | Disposition: A | Discharge status: Home / Self Care | Payer: Medicare Other | Source: Ambulatory Visit | Attending: Internal Medicine | Admitting: Internal Medicine

## 2012-01-16 DIAGNOSIS — K4090 Unilateral inguinal hernia, without obstruction or gangrene, not specified as recurrent: Secondary | ICD-10-CM | POA: Diagnosis not present

## 2012-01-16 DIAGNOSIS — K469 Unspecified abdominal hernia without obstruction or gangrene: Secondary | ICD-10-CM

## 2012-01-28 DIAGNOSIS — J209 Acute bronchitis, unspecified: Secondary | ICD-10-CM | POA: Diagnosis not present

## 2012-01-28 DIAGNOSIS — J0100 Acute maxillary sinusitis, unspecified: Secondary | ICD-10-CM | POA: Diagnosis not present

## 2012-01-31 DIAGNOSIS — M47814 Spondylosis without myelopathy or radiculopathy, thoracic region: Secondary | ICD-10-CM | POA: Diagnosis not present

## 2012-02-09 DIAGNOSIS — R0602 Shortness of breath: Secondary | ICD-10-CM | POA: Diagnosis not present

## 2012-02-09 DIAGNOSIS — B370 Candidal stomatitis: Secondary | ICD-10-CM | POA: Diagnosis not present

## 2012-02-09 DIAGNOSIS — R062 Wheezing: Secondary | ICD-10-CM | POA: Diagnosis not present

## 2012-02-09 DIAGNOSIS — R030 Elevated blood-pressure reading, without diagnosis of hypertension: Secondary | ICD-10-CM | POA: Diagnosis not present

## 2012-02-09 DIAGNOSIS — J029 Acute pharyngitis, unspecified: Secondary | ICD-10-CM | POA: Diagnosis not present

## 2012-02-11 ENCOUNTER — Other Ambulatory Visit (HOSPITAL_COMMUNITY): Payer: Self-pay | Admitting: Internal Medicine

## 2012-02-11 ENCOUNTER — Ambulatory Visit (HOSPITAL_COMMUNITY)
Admission: RE | Admit: 2012-02-11 | Discharge: 2012-02-11 | Disposition: A | Discharge status: Home / Self Care | Payer: Medicare Other | Source: Ambulatory Visit | Attending: Internal Medicine | Admitting: Internal Medicine

## 2012-02-11 DIAGNOSIS — J209 Acute bronchitis, unspecified: Secondary | ICD-10-CM | POA: Diagnosis not present

## 2012-02-11 DIAGNOSIS — R062 Wheezing: Secondary | ICD-10-CM

## 2012-02-11 DIAGNOSIS — R05 Cough: Secondary | ICD-10-CM | POA: Insufficient documentation

## 2012-02-11 DIAGNOSIS — R059 Cough, unspecified: Secondary | ICD-10-CM | POA: Diagnosis not present

## 2012-02-11 DIAGNOSIS — R0602 Shortness of breath: Secondary | ICD-10-CM | POA: Insufficient documentation

## 2012-02-11 DIAGNOSIS — J449 Chronic obstructive pulmonary disease, unspecified: Secondary | ICD-10-CM | POA: Diagnosis not present

## 2012-02-11 DIAGNOSIS — J4489 Other specified chronic obstructive pulmonary disease: Secondary | ICD-10-CM | POA: Diagnosis not present

## 2012-02-11 DIAGNOSIS — Z79899 Other long term (current) drug therapy: Secondary | ICD-10-CM | POA: Diagnosis not present

## 2012-02-12 DIAGNOSIS — M2040 Other hammer toe(s) (acquired), unspecified foot: Secondary | ICD-10-CM | POA: Diagnosis not present

## 2012-02-20 DIAGNOSIS — J449 Chronic obstructive pulmonary disease, unspecified: Secondary | ICD-10-CM | POA: Diagnosis not present

## 2012-02-20 DIAGNOSIS — J029 Acute pharyngitis, unspecified: Secondary | ICD-10-CM | POA: Diagnosis not present

## 2012-03-03 DIAGNOSIS — M549 Dorsalgia, unspecified: Secondary | ICD-10-CM | POA: Diagnosis not present

## 2012-03-03 DIAGNOSIS — F17200 Nicotine dependence, unspecified, uncomplicated: Secondary | ICD-10-CM | POA: Diagnosis not present

## 2012-03-04 ENCOUNTER — Encounter (INDEPENDENT_AMBULATORY_CARE_PROVIDER_SITE_OTHER): Payer: Self-pay | Admitting: Surgery

## 2012-03-04 ENCOUNTER — Ambulatory Visit (INDEPENDENT_AMBULATORY_CARE_PROVIDER_SITE_OTHER): Payer: Medicare Other | Admitting: Surgery

## 2012-03-04 VITALS — BP 160/80 | HR 76 | Resp 25 | Ht 63.0 in | Wt 93.0 lb

## 2012-03-04 DIAGNOSIS — I10 Essential (primary) hypertension: Secondary | ICD-10-CM | POA: Diagnosis not present

## 2012-03-04 DIAGNOSIS — K4090 Unilateral inguinal hernia, without obstruction or gangrene, not specified as recurrent: Secondary | ICD-10-CM

## 2012-03-04 DIAGNOSIS — R490 Dysphonia: Secondary | ICD-10-CM | POA: Diagnosis not present

## 2012-03-04 DIAGNOSIS — I720 Aneurysm of carotid artery: Secondary | ICD-10-CM

## 2012-03-04 DIAGNOSIS — F17200 Nicotine dependence, unspecified, uncomplicated: Secondary | ICD-10-CM

## 2012-03-04 DIAGNOSIS — Z720 Tobacco use: Secondary | ICD-10-CM

## 2012-03-04 DIAGNOSIS — M2040 Other hammer toe(s) (acquired), unspecified foot: Secondary | ICD-10-CM | POA: Diagnosis not present

## 2012-03-04 DIAGNOSIS — J40 Bronchitis, not specified as acute or chronic: Secondary | ICD-10-CM | POA: Diagnosis not present

## 2012-03-04 HISTORY — DX: Aneurysm of carotid artery: I72.0

## 2012-03-04 NOTE — Progress Notes (Signed)
Subjective:     Patient ID: [name], female   DOB: [birth year], 66 y.o.   MRN: [id number]  HPI  [name]  Feb 17, 1946 [id number]  Patient Care Team: Lucky Cowboy, MD as PCP - General (Internal Medicine)  This patient is a 66 y.o.female who presents today for surgical evaluation at the request of Dr. Oneta Rack.   Reason for evaluation: Painful lump in groin.  Probable hernia  Pleasant female.  History of stroke in distant past.  Has been on Plavix.  She is off for now.  She does not know why.  History of COPD but does not use oxygen.  Claims to be active in the summertime.  Noticed burning and a lump in her right groin about three months ago.  It has gotten worse.  It bothers her when she picks anything up.  Even straining to urinate is annoying.  His become frustrating to her.  She pointed this out to her primary care physician.  He sent the patient to me for surgical evaluation.  No problems with constipation.  No history of skin infections nor abscesses.  No MRSA.  She can walk about 10 minutes before she has to stop due to DOE.  She has a bowel movement about every other day.  Patient Active Problem List  Diagnosis  . Inguinal hernia, right  . Carotid artery aneurysm  . Hoarseness of voice  . Tobacco abuse    Past Medical History  Diagnosis Date  . Asthma   . Hypertension   . GERD (gastroesophageal reflux disease)   . Stroke     was on Plavix - not now  . Carotid artery aneurysm 03/04/2012    No past surgical history on file.  History   Social History  . Marital Status: Single    Spouse Name: N/A    Number of Children: N/A  . Years of Education: N/A   Occupational History  . Not on file.   Social History Main Topics  . Smoking status: Current Every Day Smoker -- 0.5 packs/day for 55 years  . Smokeless tobacco: Not on file  . Alcohol Use: No  . Drug Use: No  . Sexually Active: Not on file   Other Topics Concern  . Not on file   Social History  Narrative  . No narrative on file    Family History  Problem Relation Age of Onset  . Breast cancer Mother   . Breast cancer Sister     Current Outpatient Prescriptions  Medication Sig Dispense Refill  . albuterol (PROVENTIL) (2.5 MG/3ML) 0.083% nebulizer solution Take 2.5 mg by nebulization every 6 (six) hours as needed.      Marland Kitchen AMLODIPINE BESYLATE PO Take by mouth daily.      . cephALEXin (KEFLEX) 500 MG capsule Take 500 mg by mouth 4 (four) times daily.      . Fluticasone-Salmeterol (ADVAIR) 250-50 MCG/DOSE AEPB Inhale 1 puff into the lungs every 12 (twelve) hours.      . gabapentin (NEURONTIN) 300 MG capsule       . HYDROcodone-acetaminophen (NORCO/VICODIN) 5-325 MG per tablet       . lansoprazole (PREVACID) 30 MG capsule       . levofloxacin (LEVAQUIN) 500 MG tablet       . LORazepam (ATIVAN) 0.5 MG tablet       . montelukast (SINGULAIR) 10 MG tablet       . nystatin (MYCOSTATIN) 100000 UNIT/ML suspension       .  oxybutynin (DITROPAN) 5 MG tablet       . QUEtiapine (SEROQUEL) 400 MG tablet       . QUINAPRIL HCL PO Take by mouth.      . tiotropium (SPIRIVA) 18 MCG inhalation capsule Place 18 mcg into inhaler and inhale daily.      . traMADol (ULTRAM) 50 MG tablet          Allergies  Allergen Reactions  . Sulfa Antibiotics     BP 160/80  Pulse 76  Resp 25  Ht 5\' 3"  (1.6 m)  Wt 93 lb (42.185 kg)  BMI 16.47 kg/m2  Dg Chest 2 View  02/11/2012  *RADIOLOGY REPORT*  Clinical Data: Wheezing, cough, shortness of breath  CHEST - 2 VIEW  Comparison: Chest x-ray of 12/25/2011  Findings: The lungs are clear but hyperaerated consistent with COPD.  Mediastinal contours are stable.  The heart is within upper limits of normal and stable.  No bony abnormality is seen.  IMPRESSION: COPD.  No active lung disease   Original Report Authenticated By: Dwyane Dee, M.D.      Review of Systems  Constitutional: Negative for fever, chills, diaphoresis, activity change, appetite change, fatigue  and unexpected weight change.  HENT: Negative for ear pain, sore throat, rhinorrhea, drooling, mouth sores, trouble swallowing, neck pain, postnasal drip and ear discharge.   Eyes: Negative for photophobia, discharge and visual disturbance.  Respiratory: Negative for cough, choking, chest tightness and shortness of breath.   Cardiovascular: Negative for chest pain and palpitations.  Gastrointestinal: Negative for nausea, vomiting, abdominal pain, diarrhea, constipation, anal bleeding and rectal pain.  Genitourinary: Negative for dysuria, frequency and difficulty urinating.  Musculoskeletal: Negative for back pain and gait problem.  Skin: Negative for color change, pallor and rash.  Neurological: Negative for dizziness, speech difficulty, weakness and numbness.  Hematological: Negative for adenopathy.  Psychiatric/Behavioral: Negative for confusion and agitation. The patient is not nervous/anxious.        Objective:   Physical Exam  Constitutional: She is oriented to person, place, and time. She appears well-developed and well-nourished. No distress.  HENT:  Head: Normocephalic.  Mouth/Throat: Oropharynx is clear and moist. Mucous membranes are dry. She does not have dentures. Normal dentition. No uvula swelling. No oropharyngeal exudate.       Very scratchy/hoarse throat - claims this has been normal for years.  No choking, difficulty swallowing  Eyes: Conjunctivae normal and EOM are normal. Pupils are equal, round, and reactive to light. No scleral icterus.  Neck: Normal range of motion. Neck supple. No tracheal deviation present.  Cardiovascular: Normal rate, regular rhythm and intact distal pulses.   Pulmonary/Chest: Effort normal and breath sounds normal. No respiratory distress. She exhibits no tenderness.  Abdominal: Soft. She exhibits no distension and no mass. There is no tenderness. No hernia. Hernia confirmed negative in the left inguinal area.    Genitourinary: No vaginal  discharge found.  Musculoskeletal: Normal range of motion. She exhibits no tenderness.  Lymphadenopathy:    She has no cervical adenopathy.       Right: No inguinal adenopathy present.       Left: No inguinal adenopathy present.  Neurological: She is alert and oriented to person, place, and time. No cranial nerve deficit. She exhibits normal muscle tone. Coordination normal.  Skin: Skin is warm and dry. No rash noted. She is not diaphoretic. No erythema.  Psychiatric: She has a normal mood and affect. Her behavior is normal. Judgment and thought content normal.  Assessment:     Small RIH in smoking pt w COPD & h/o stroke but somewhat decent activity level    Plan:     This hernia seems rather symptomatic and bothersome to her.  Becoming more burning/larger..  I think he would benefit from repair.    The anatomy & physiology of the abdominal wall and pelvic floor was discussed.  The pathophysiology of hernias in the inguinal and pelvic region was discussed.  Natural history risks such as progressive enlargement, pain, incarceration & strangulation was discussed.   Contributors to complications such as smoking, obesity, diabetes, prior surgery, etc were discussed.    I feel the risks of no intervention will lead to serious problems that outweigh the operative risks; therefore, I recommended surgery to reduce and repair the hernia.  I explained laparoscopic techniques with possible need for an open approach.  I noted usual use of mesh to patch and/or buttress hernia repair  Risks such as bleeding, infection, abscess, need for further treatment, heart attack, death, and other risks were discussed.  I noted a good likelihood this will help address the problem.   Goals of post-operative recovery were discussed as well.  Possibility that this will not correct all symptoms was explained.  I stressed the importance of low-impact activity, aggressive pain control, avoiding constipation, & not  pushing through pain to minimize risk of post-operative chronic pain or injury. Possibility of reherniation was discussed.  We will work to minimize complications.     An educational handout further explaining the pathology & treatment options was given as well.  Questions were answered.  The patient expresses understanding & wishes to proceed with surgery.  However, I am concerned about her operative risk given her history of stroke and persistent smoking and COPD with DOE.  I am concerned about the health of the patient and the ability to tolerate the operation.  Therefore, we will request clearance by cardiology to better assess operative risk & see if a reevaluation, further workup, adjustment to medications, etc is needed.  I recommend she consider an ENT evaluation to make sure there is no surprises with her throat.  She tells me this has been stable for years.  Energy level and weight stable in good.  She declined suggested that at this time.

## 2012-03-04 NOTE — Patient Instructions (Signed)
See the Handout(s) we gave you.  Consider surgery.    We would like to have a cardiologist clear you to make sure we can do your surgery as safely as possible.  Please call our office at [phone] if you wish to schedule surgery or if you have further questions / concerns.    Hernia A hernia occurs when an internal organ pushes out through a weak spot in the abdominal wall. Hernias most commonly occur in the groin and around the navel. Hernias often can be pushed back into place (reduced). Most hernias tend to get worse over time. Some abdominal hernias can get stuck in the opening (irreducible or incarcerated hernia) and cannot be reduced. An irreducible abdominal hernia which is tightly squeezed into the opening is at risk for impaired blood supply (strangulated hernia). A strangulated hernia is a medical emergency. Because of the risk for an irreducible or strangulated hernia, surgery may be recommended to repair a hernia. CAUSES   Heavy lifting.  Prolonged coughing.  Straining to have a bowel movement.  A cut (incision) made during an abdominal surgery. HOME CARE INSTRUCTIONS   Bed rest is not required. You may continue your normal activities.  Avoid lifting more than 10 pounds (4.5 kg) or straining.  Cough gently. If you are a smoker it is best to stop. Even the best hernia repair can break down with the continual strain of coughing. Even if you do not have your hernia repaired, a cough will continue to aggravate the problem.  Do not wear anything tight over your hernia. Do not try to keep it in with an outside bandage or truss. These can damage abdominal contents if they are trapped within the hernia sac.  Eat a normal diet.  Avoid constipation. Straining over long periods of time will increase hernia size and encourage breakdown of repairs. If you cannot do this with diet alone, stool softeners may be used. SEEK IMMEDIATE MEDICAL CARE IF:   You have a fever.  You  develop increasing abdominal pain.  You feel nauseous or vomit.  Your hernia is stuck outside the abdomen, looks discolored, feels hard, or is tender.  You have any changes in your bowel habits or in the hernia that are unusual for you.  You have increased pain or swelling around the hernia.  You cannot push the hernia back in place by applying gentle pressure while lying down. MAKE SURE YOU:   Understand these instructions.  Will watch your condition.  Will get help right away if you are not doing well or get worse. Document Released: 03/12/2005 Document Revised: 06/04/2011 Document Reviewed: 10/30/2007 [facility] Patient Information 2013 [place], [place].  HERNIA REPAIR: POST OP INSTRUCTIONS  1. DIET: Follow a light bland diet the first 24 hours after arrival home, such as soup, liquids, crackers, etc.  Be sure to include lots of fluids daily.  Avoid fast food or heavy meals as your are more likely to get nauseated.  Eat a low fat the next few days after surgery. 2. Take your usually prescribed home medications unless otherwise directed. 3. PAIN CONTROL: a. Pain is best controlled by a usual combination of three different methods TOGETHER: i. Ice/Heat ii. Over the counter pain medication iii. Prescription pain medication b. Most patients will experience some swelling and bruising around the hernia(s) such as the bellybutton, groins, or old incisions.  Ice packs or heating pads (30-60 minutes up to 6 times a day) will help. Use ice for the first  few days to help decrease swelling and bruising, then switch to heat to help relax tight/sore spots and speed recovery.  Some people prefer to use ice alone, heat alone, alternating between ice & heat.  Experiment to what works for you.  Swelling and bruising can take several weeks to resolve.   c. It is helpful to take an over-the-counter pain medication regularly for the first few weeks.  Choose one of the following that works best for  you: i. Naproxen (Aleve, etc)  Two 220mg  tabs twice a day ii. Ibuprofen (Advil, etc) Three 200mg  tabs four times a day (every meal & bedtime) iii. Acetaminophen (Tylenol, etc) 325-650mg  four times a day (every meal & bedtime) d. A  prescription for pain medication should be given to you upon discharge.  Take your pain medication as prescribed.  i. If you are having problems/concerns with the prescription medicine (does not control pain, nausea, vomiting, rash, itching, etc), please call us [phone] to see if we need to switch you to a different pain medicine that will work better for you and/or control your side effect better. ii. If you need a refill on your pain medication, please contact your pharmacy.  They will contact our office to request authorization. Prescriptions will not be filled after 5 pm or on week-ends. 4. Avoid getting constipated.  Between the surgery and the pain medications, it is common to experience some constipation.  Increasing fluid intake and taking a fiber supplement (such as Metamucil, Citrucel, FiberCon, MiraLax, etc) 1-2 times a day regularly will usually help prevent this problem from occurring.  A mild laxative (prune juice, Milk of Magnesia, MiraLax, etc) should be taken according to package directions if there are no bowel movements after 48 hours.   5. Wash / shower every day.  You may shower over the dressings as they are waterproof.   6. Remove your waterproof bandages 5 days after surgery.  You may leave the incision open to air.  You may replace a dressing/Band-Aid to cover the incision for comfort if you wish.  Continue to shower over incision(s) after the dressing is off.    7. ACTIVITIES as tolerated:   a. You may resume regular (light) daily activities beginning the next day-such as daily self-care, walking, climbing stairs-gradually increasing activities as tolerated.  If you can walk 30 minutes without difficulty, it is safe to try more intense  activity such as jogging, treadmill, bicycling, low-impact aerobics, swimming, etc. b. Save the most intensive and strenuous activity for last such as sit-ups, heavy lifting, contact sports, etc  Refrain from any heavy lifting or straining until you are off narcotics for pain control.   c. DO NOT PUSH THROUGH PAIN.  Let pain be your guide: If it hurts to do something, don't do it.  Pain is your body warning you to avoid that activity for another week until the pain goes down. d. You may drive when you are no longer taking prescription pain medication, you can comfortably wear a seatbelt, and you can safely maneuver your car and apply brakes. e. Bonita Quin may have sexual intercourse when it is comfortable.  8. FOLLOW UP in our office a. Please call [facility] at [phone] to set up an appointment to see your surgeon in the office for a follow-up appointment approximately 2-3 weeks after your surgery. b. Make sure that you call for this appointment the day you arrive home to insure a convenient appointment time. 9.  IF YOU HAVE  DISABILITY OR FAMILY LEAVE FORMS, BRING THEM TO THE OFFICE FOR PROCESSING.  DO NOT GIVE THEM TO YOUR DOCTOR.  WHEN TO CALL us [phone]: 1. Poor pain control 2. Reactions / problems with new medications (rash/itching, nausea, etc)  3. Fever over 101.5 F (38.5 C) 4. Inability to urinate 5. Nausea and/or vomiting 6. Worsening swelling or bruising 7. Continued bleeding from incision. 8. Increased pain, redness, or drainage from the incision   The clinic staff is available to answer your questions during regular business hours (8:30am-5pm).  Please don't hesitate to call and ask to speak to one of our nurses for clinical concerns.   If you have a medical emergency, go to the nearest emergency room or call 911.  A surgeon from [facility] is always on call at the hospitals in [facility], [place] [address], [address], [place],  [place]  [address] ?  [address] [address], [place], [place]   [address] MAIN: [phone] ? TOLL FREE: [phone] ? FAX: [phone] [email or web address]

## 2012-03-04 NOTE — Addendum Note (Signed)
Addended byLiliana Cline on: 03/04/2012 04:08 PM   Modules accepted: Orders

## 2012-03-05 ENCOUNTER — Other Ambulatory Visit: Payer: Self-pay | Admitting: Internal Medicine

## 2012-03-05 DIAGNOSIS — J449 Chronic obstructive pulmonary disease, unspecified: Secondary | ICD-10-CM

## 2012-03-05 DIAGNOSIS — R0602 Shortness of breath: Secondary | ICD-10-CM

## 2012-03-06 ENCOUNTER — Telehealth (INDEPENDENT_AMBULATORY_CARE_PROVIDER_SITE_OTHER): Payer: Self-pay | Admitting: General Surgery

## 2012-03-06 DIAGNOSIS — R490 Dysphonia: Secondary | ICD-10-CM | POA: Diagnosis not present

## 2012-03-06 DIAGNOSIS — J029 Acute pharyngitis, unspecified: Secondary | ICD-10-CM | POA: Diagnosis not present

## 2012-03-06 NOTE — Telephone Encounter (Signed)
Left message on machine to call back and ask for me. To make her aware no cardiology groups have appts in December for new patients. Appt made with [facility] 04/04/11 @ 11:00 am.

## 2012-03-06 NOTE — Telephone Encounter (Signed)
The pt called back and I gave her the message about her appointment.

## 2012-03-07 ENCOUNTER — Other Ambulatory Visit: Payer: Medicare Other

## 2012-03-12 ENCOUNTER — Other Ambulatory Visit: Payer: Medicare Other

## 2012-03-13 ENCOUNTER — Other Ambulatory Visit: Payer: Medicare Other

## 2012-03-14 ENCOUNTER — Other Ambulatory Visit: Payer: Medicare Other

## 2012-03-17 ENCOUNTER — Ambulatory Visit
Admission: RE | Admit: 2012-03-17 | Discharge: 2012-03-17 | Disposition: A | Discharge status: Home / Self Care | Payer: Medicare Other | Source: Ambulatory Visit | Attending: Internal Medicine | Admitting: Internal Medicine

## 2012-03-17 DIAGNOSIS — I2510 Atherosclerotic heart disease of native coronary artery without angina pectoris: Secondary | ICD-10-CM | POA: Diagnosis not present

## 2012-03-17 DIAGNOSIS — J438 Other emphysema: Secondary | ICD-10-CM | POA: Diagnosis not present

## 2012-03-17 DIAGNOSIS — R0602 Shortness of breath: Secondary | ICD-10-CM

## 2012-03-17 DIAGNOSIS — J449 Chronic obstructive pulmonary disease, unspecified: Secondary | ICD-10-CM

## 2012-03-26 HISTORY — PX: HERNIA REPAIR: SHX51

## 2012-03-28 ENCOUNTER — Ambulatory Visit (INDEPENDENT_AMBULATORY_CARE_PROVIDER_SITE_OTHER): Payer: Medicare Other | Admitting: Pulmonary Disease

## 2012-03-28 ENCOUNTER — Encounter: Payer: Self-pay | Admitting: Pulmonary Disease

## 2012-03-28 VITALS — BP 150/82 | HR 100 | Temp 98.4°F | Ht 62.0 in | Wt 90.4 lb

## 2012-03-28 DIAGNOSIS — J398 Other specified diseases of upper respiratory tract: Secondary | ICD-10-CM

## 2012-03-28 DIAGNOSIS — J449 Chronic obstructive pulmonary disease, unspecified: Secondary | ICD-10-CM

## 2012-03-28 DIAGNOSIS — J383 Other diseases of vocal cords: Secondary | ICD-10-CM | POA: Insufficient documentation

## 2012-03-28 DIAGNOSIS — R222 Localized swelling, mass and lump, trunk: Secondary | ICD-10-CM | POA: Diagnosis not present

## 2012-03-28 NOTE — Progress Notes (Signed)
  Subjective:    Patient ID: [name], female    DOB: 07/16/1945, 67 y.o.   MRN: [id number]  HPI 67-year-old one pack per day smoker referred for evaluation of dyspnea and abnormal CT scan. She had a recent pharyngitis and bronchitic episode the was treated with prednisone, Levaquin And ENT referral was advised for throat pain. Chest x-ray showed hyperinflation Ct chest 12/23 showed Moderately severe diffuse centrilobular emphysema & Stable scarring in both lung apices.  A polypoid lesion was seen within the tracheal air shadow. Coronary artery calcifications were noted. Note, CT scan from 8/11 had shown a smaller polypoid lesion in the left wall of her trachea. She reports hoarseness all her life and dyspnea and exertion. She reports mostly dry cough with occasional clear sputum. Albuterol nebulizer 2-3 times daily is helping her breathing better. She's lost about 10 pounds in the last one year. Spirometry showed severe airway obstruction with FEV1 of 39%- 0.82 with FVC of 1.39 (52%) Labs showed WBC count of 10.1, hemoglobin 14, BUN/creatinine of 9/0.8, normal LFTs and TSH, albumin level is surprisingly 4.8.   Past Medical History  Diagnosis Date  . Asthma   . Hypertension   . GERD (gastroesophageal reflux disease)   . Stroke     was on Plavix - not now  . Carotid artery aneurysm 03/04/2012  . COPD (chronic obstructive pulmonary disease)   . DJD (degenerative joint disease)     No past surgical history on file.  Allergies  Allergen Reactions  . Sulfa Antibiotics     History   Social History  . Marital Status: Single    Spouse Name: N/A    Number of Children: N/A  . Years of Education: N/A   Occupational History  . retired    Social History Main Topics  . Smoking status: Current Every Day Smoker -- 0.5 packs/day for 30 years  . Smokeless tobacco: Not on file  . Alcohol Use: No  . Drug Use: No  . Sexually Active: Not on file   Other Topics Concern  . Not on file    Social History Narrative  . No narrative on file      Review of Systems  Constitutional: Negative for appetite change and unexpected weight change.  HENT: Negative for ear pain, congestion, sneezing, trouble swallowing and dental problem.   Respiratory: Positive for cough and shortness of breath.   Cardiovascular: Negative for chest pain, palpitations and leg swelling.  Gastrointestinal: Negative for abdominal pain.  Skin: Negative for rash.  Neurological: Negative for headaches.  Psychiatric/Behavioral: Negative for dysphoric mood. The patient is not nervous/anxious.        Objective:   Physical Exam        Assessment & Plan:   

## 2012-03-28 NOTE — Patient Instructions (Addendum)
You have severe COPD - lung capacity is at 39% You have to quit smoking  You have a polyp in your trachea We will set up for bronchoscopy on Wednesday 15 th am Stay on advair & spiriva

## 2012-03-28 NOTE — Assessment & Plan Note (Signed)
You have to quit smoking  Stay on advair & spiriva

## 2012-03-28 NOTE — Assessment & Plan Note (Addendum)
1/14 Larger compared to 8/11 Official diagnosis include cylindroma less likely lung malignancy. The various options of biopsy including bronchoscopy and surgical biopsy were discussed.The risks of each procedure including coughing, bleeding and the  chances of lung puncture requiring chest tube were discussed in great detail. The benefits & alternatives including serial follow up were also discussed. Based on appearance we will decide about referral to ENT or thoracic surgery

## 2012-04-02 ENCOUNTER — Encounter (HOSPITAL_COMMUNITY): Payer: Medicare Other

## 2012-04-02 DIAGNOSIS — I10 Essential (primary) hypertension: Secondary | ICD-10-CM | POA: Diagnosis not present

## 2012-04-03 ENCOUNTER — Encounter: Payer: Self-pay | Admitting: Cardiovascular Disease

## 2012-04-03 ENCOUNTER — Telehealth (INDEPENDENT_AMBULATORY_CARE_PROVIDER_SITE_OTHER): Payer: Self-pay | Admitting: General Surgery

## 2012-04-03 ENCOUNTER — Ambulatory Visit (INDEPENDENT_AMBULATORY_CARE_PROVIDER_SITE_OTHER): Payer: Medicare PPO | Admitting: Cardiovascular Disease

## 2012-04-03 VITALS — BP 152/94 | HR 91 | Ht 62.0 in | Wt 91.0 lb

## 2012-04-03 DIAGNOSIS — Z01810 Encounter for preprocedural cardiovascular examination: Secondary | ICD-10-CM

## 2012-04-03 NOTE — Patient Instructions (Addendum)
Your physician recommends that you schedule a follow-up appointment in: AS NEEDED BASIS  

## 2012-04-03 NOTE — Assessment & Plan Note (Addendum)
[name] is a 67 yo who was referred here for preoperative evaluation prior to having hernia surgery. She has end-stage COPD. She also has had a 10 pound weight loss and has been found to have a nodule on her trachea. She has been seen by the pulmonologist and is scheduled for bronchoscopy.  She does have a history of a remote TIA but has no residual ischemic problems.  She had an echocardiogram performed 2 years ago and had normal left ventricular systolic function.  In my opinion she is at significant risk from pulmonary issues but I really don't see any specific cardiology issues. She's not a candidate for stress testing-I don't think that she would be able to walk on a treadmill and she does not complain of any episodes of chest pain. She does have chronic dyspnea but her FEV1 is 39% of predicted.  In my opinion her significant risk factors surrounding surgery would involve her COPD and pulmonary issues.   She has a right inguinal hernia and states that it is not symptomatic at this time.  I think that her prognosis from a pulmonary standpoint is quite limited.   We will not schedule her for a return appointment but would be happy to see her in the future if needed.

## 2012-04-03 NOTE — Progress Notes (Signed)
Forde Dandy Date of Birth  [birth year]       [facility] [address], [address]  [address], [address] [place], [place]  [address]   [place], [place]  [address] [phone]     [phone]   Fax  [phone]    Fax [phone]  Problem List: 1. Hernia 2. Hypertension 3. COPD  History of Present Illness:  Ms Mentel is a 67 yo with a long hx of smoking - she has very severe COPD (Spirometry showed severe airway obstruction with FEV1 of 39%- 0.82 with FVC of 1.39 (52%) She denies any chest pain.  She has had 3 mini- strokes in the past.  She has no residual deficits.    She was recently found to have a right inguinal hernia and was asked to see [place] for pre-op evaluation.  She states it really hasn't bothered her much. She has a history of mild hypertension which is controlled by her medical doctor.  She's never had any episodes of angina. She's quite limited because of her severe, end-stage COPD.  Current Outpatient Prescriptions on File Prior to Visit  Medication Sig Dispense Refill  . albuterol (PROVENTIL HFA;VENTOLIN HFA) 108 (90 BASE) MCG/ACT inhaler Inhale 2 puffs into the lungs every 6 (six) hours as needed.      Marland Kitchen albuterol (PROVENTIL) (2.5 MG/3ML) 0.083% nebulizer solution Take 2.5 mg by nebulization every 6 (six) hours as needed.      Marland Kitchen AMLODIPINE BESYLATE PO Take 0.5 tablets by mouth daily.       . Fluticasone-Salmeterol (ADVAIR) 250-50 MCG/DOSE AEPB Inhale 1 puff into the lungs every 12 (twelve) hours.      . gabapentin (NEURONTIN) 300 MG capsule 1 tablet twice a day      . HYDROcodone-acetaminophen (NORCO/VICODIN) 5-325 MG per tablet Take 1 tablet by mouth every 6 (six) hours as needed.       . lansoprazole (PREVACID) 30 MG capsule Take 30 mg by mouth daily.       Marland Kitchen LORazepam (ATIVAN) 0.5 MG tablet 3-4 daily as needed      . MIRTAZAPINE PO Take by mouth as needed.      . montelukast (SINGULAIR) 10 MG tablet as needed.       Marland Kitchen  oxybutynin (DITROPAN) 5 MG tablet Take 5 mg by mouth daily.       . QUEtiapine (SEROQUEL) 400 MG tablet Take 400 mg by mouth at bedtime.       . QUINAPRIL HCL PO Take 0.5 tablets by mouth as needed.       . tiotropium (SPIRIVA) 18 MCG inhalation capsule Place 18 mcg into inhaler and inhale daily.      . traMADol (ULTRAM) 50 MG tablet as needed.         Allergies  Allergen Reactions  . Sulfa Antibiotics     Past Medical History  Diagnosis Date  . Asthma   . Hypertension   . GERD (gastroesophageal reflux disease)   . Stroke     was on Plavix - not now  . Carotid artery aneurysm 03/04/2012  . COPD (chronic obstructive pulmonary disease)   . DJD (degenerative joint disease)     No past surgical history on file.  History  Smoking status  . Current Every Day Smoker -- 0.5 packs/day for 30 years  Smokeless tobacco  . Not on file    History  Alcohol Use No    Family  History  Problem Relation Age of Onset  . Breast cancer Mother   . Breast cancer Sister     Reviw of Systems:  Reviewed in the HPI.  All other systems are negative.  Physical Exam: Blood pressure 152/94, pulse 91, height 5\' 2"  (1.575 m), weight 91 lb (41.277 kg). General: Well developed, she is very thin and chronically ill appearing  Head: Normocephalic, atraumatic, sclera non-icteric, mucus membranes are moist, her voice is very hoarse  Neck: Supple. Carotids are 2 + without bruits. No JVD   Lungs: reduced breath sounds  Heart: RR, no murmurs  Abdomen: Soft, non-tender, non-distended with normal bowel sounds.  Msk:  Strength and tone are normal   Extremities: No clubbing or cyanosis. No edema.  Distal pedal pulses are 2+ and equal    Neuro: CN II - XII intact.  Alert and oriented X 3.   Psych:  Normal   ECG: April 03, 2012:  NSR at 90.  No ST or T wave changes  Assessment / Plan:

## 2012-04-03 NOTE — Telephone Encounter (Signed)
Receptionist called because pt was seen by dr. Elease Hashimoto today for cardiac clearance and Dr. Elease Hashimoto is not sure why she needs it? Please call Dr. Elease Hashimoto at [phone] re this, Dr. Michaell Cowing was not available to speak with him/ thanks/gy

## 2012-04-04 DIAGNOSIS — M2040 Other hammer toe(s) (acquired), unspecified foot: Secondary | ICD-10-CM | POA: Diagnosis not present

## 2012-04-08 ENCOUNTER — Telehealth: Payer: Self-pay | Admitting: Pulmonary Disease

## 2012-04-08 MED ORDER — CEFAZOLIN SODIUM-DEXTROSE 2-3 GM-% IV SOLR
2.0000 g | INTRAVENOUS | Status: DC
  Filled 2012-04-08: qty 50

## 2012-04-08 NOTE — Telephone Encounter (Signed)
[email or web address].Jennifer Castillo, CMA  

## 2012-04-09 ENCOUNTER — Inpatient Hospital Stay (HOSPITAL_COMMUNITY): Admission: RE | Admit: 2012-04-09 | Payer: Medicare Other | Source: Ambulatory Visit

## 2012-04-09 ENCOUNTER — Ambulatory Visit (HOSPITAL_COMMUNITY): Admission: RE | Admit: 2012-04-09 | Payer: Medicare HMO | Source: Ambulatory Visit | Admitting: Pulmonary Disease

## 2012-04-09 ENCOUNTER — Encounter (HOSPITAL_COMMUNITY): Admission: RE | Payer: Self-pay | Source: Ambulatory Visit

## 2012-04-09 ENCOUNTER — Encounter (INDEPENDENT_AMBULATORY_CARE_PROVIDER_SITE_OTHER): Payer: Self-pay

## 2012-04-09 SURGERY — VIDEO BRONCHOSCOPY WITHOUT FLUORO
Anesthesia: Moderate Sedation | Laterality: Bilateral

## 2012-04-10 NOTE — Telephone Encounter (Signed)
LMTCB x2  

## 2012-04-11 NOTE — Telephone Encounter (Signed)
lmomtcb -- looks like bronch was cancelled but it hasn't been rescheduled.

## 2012-04-14 ENCOUNTER — Telehealth: Payer: Self-pay | Admitting: Pulmonary Disease

## 2012-04-14 NOTE — Telephone Encounter (Signed)
I agree that the tracheal lesion/nodule takes priority.  This is why he tried to encourage her to see ENT/pulmonary as well.  Hold off on any hernia surgery until that is addressed.

## 2012-04-14 NOTE — Telephone Encounter (Signed)
She has what appears to be a tracheal lesion that has grown in size. She cancelled her bronchoscopy to further evaluate this & decide whether she needs ENT evaluation for this. I cannot clear her for hernia surgery until we evaluate this airway problem. She needs to reschedule bronchoscopy or make another OV to discuss plan. Pl let her know. I am also forwarding this note to her surgeon.

## 2012-04-14 NOTE — Telephone Encounter (Signed)
LMTCB x 1 

## 2012-04-15 NOTE — Telephone Encounter (Signed)
Called, spoke with pt.  Pt states she would like to talk with Dr. Vassie Loll prior to have the bronch rescheduled.  Pt states she is "scared" about having the sedation and would like to know exactly what he is going to do.  Also, states she has a hard time getting up at 6 am and would like to know if the bronch could be done later in the day.  She would like to know if RA could call her about this (she doesn't have a pending appt).  Dr. Vassie Loll, pls advise.  Thank you.  Also, pt wants you to know that you made her day.  States you were the "Sweetest, nicest dr that I have ran into in years and years and years."

## 2012-04-15 NOTE — Telephone Encounter (Signed)
I spoke to pt - I have left message to reschedule bronchoscopy on Thursday at 11am, [place], no fluoro. Pl confirm in am

## 2012-04-16 ENCOUNTER — Telehealth: Payer: Self-pay | Admitting: Pulmonary Disease

## 2012-04-16 NOTE — Telephone Encounter (Signed)
I spoke with [name] and she did receive message and bronch is scheduled for tomorrow at 11 am. Pt is aware of this as well. Will forward to RA so he is aware as Lorain Childes

## 2012-04-16 NOTE — Telephone Encounter (Signed)
THANKS

## 2012-04-16 NOTE — Telephone Encounter (Signed)
Per pt's chart, the bronch was just scheduled this morning.  Documentation shows that pt was "scared" about the anesthesia.  ATC patient to discuss why the bronch needs to be rescheduled, no answer.  LMTCB x1, asked that she call back ASAP.  Due to the short notice, will forward to RA to make him aware that pt is wanting to reschedule the bronch.

## 2012-04-17 ENCOUNTER — Encounter (HOSPITAL_COMMUNITY): Admission: RE | Payer: Self-pay | Source: Ambulatory Visit

## 2012-04-17 ENCOUNTER — Inpatient Hospital Stay (HOSPITAL_COMMUNITY): Admission: RE | Admit: 2012-04-17 | Discharge: 2012-04-17 | Payer: Self-pay | Source: Ambulatory Visit

## 2012-04-17 ENCOUNTER — Ambulatory Visit (HOSPITAL_COMMUNITY): Admission: RE | Admit: 2012-04-17 | Payer: Medicare PPO | Source: Ambulatory Visit | Admitting: Pulmonary Disease

## 2012-04-17 SURGERY — VIDEO BRONCHOSCOPY WITHOUT FLUORO
Anesthesia: Moderate Sedation | Laterality: Bilateral

## 2012-04-17 NOTE — Telephone Encounter (Signed)
Can do on Thursday 1/30 /14 at 10 am - pl schedule at [facility] if she is willing

## 2012-04-17 NOTE — Telephone Encounter (Signed)
Per pt. She stated she just received a call the day before the bronch and we could not expect her to drop her plans she already had planned to do. Also she stated she is slightly scared to do the procedure. She stated if Dr. Vassie Loll still wants to do the procedure she is available 04/21/12, 04/24/12, 04/25/12 at 10 AM next week. I spoke with Dr. Vassie Loll and he will see. Please advise thanks

## 2012-04-17 NOTE — Telephone Encounter (Signed)
I spoke with pt. Will call and speak with RA regarding pt.

## 2012-04-18 NOTE — Telephone Encounter (Signed)
I spoke with pt. She stated this is a good date for her. I have called [phone] to schedule this. Had to leave message for michelle tcb.

## 2012-04-19 NOTE — Telephone Encounter (Signed)
ok 

## 2012-04-22 DIAGNOSIS — M2040 Other hammer toe(s) (acquired), unspecified foot: Secondary | ICD-10-CM | POA: Diagnosis not present

## 2012-04-22 DIAGNOSIS — L84 Corns and callosities: Secondary | ICD-10-CM | POA: Diagnosis not present

## 2012-04-22 NOTE — Telephone Encounter (Signed)
ok 

## 2012-04-22 NOTE — Telephone Encounter (Signed)
I spoke with [name] and confirmed appt for bronch is for 04/24/12 at 10:00 at [facility]. They will call pt the day before with instructions and directions. Pt is already aware of this. Will forward back to RA so he is aware of this.

## 2012-04-23 DIAGNOSIS — Z1212 Encounter for screening for malignant neoplasm of rectum: Secondary | ICD-10-CM | POA: Diagnosis not present

## 2012-04-23 DIAGNOSIS — R7309 Other abnormal glucose: Secondary | ICD-10-CM | POA: Diagnosis not present

## 2012-04-23 DIAGNOSIS — E782 Mixed hyperlipidemia: Secondary | ICD-10-CM | POA: Diagnosis not present

## 2012-04-23 DIAGNOSIS — E559 Vitamin D deficiency, unspecified: Secondary | ICD-10-CM | POA: Diagnosis not present

## 2012-04-23 DIAGNOSIS — I10 Essential (primary) hypertension: Secondary | ICD-10-CM | POA: Diagnosis not present

## 2012-04-23 MED ORDER — CHLORHEXIDINE GLUCONATE 4 % EX LIQD
1.0000 "application " | Freq: Once | CUTANEOUS | Status: DC
  Filled 2012-04-23: qty 15

## 2012-04-24 ENCOUNTER — Encounter (HOSPITAL_COMMUNITY)
Admission: RE | Disposition: A | Discharge status: Home / Self Care | Payer: Self-pay | Source: Ambulatory Visit | Attending: Pulmonary Disease

## 2012-04-24 ENCOUNTER — Ambulatory Visit (HOSPITAL_COMMUNITY)
Admission: RE | Admit: 2012-04-24 | Discharge: 2012-04-24 | Disposition: A | Discharge status: Home / Self Care | Payer: Medicare PPO | Source: Ambulatory Visit | Attending: Pulmonary Disease | Admitting: Pulmonary Disease

## 2012-04-24 DIAGNOSIS — J449 Chronic obstructive pulmonary disease, unspecified: Secondary | ICD-10-CM | POA: Insufficient documentation

## 2012-04-24 DIAGNOSIS — R0989 Other specified symptoms and signs involving the circulatory and respiratory systems: Secondary | ICD-10-CM | POA: Insufficient documentation

## 2012-04-24 DIAGNOSIS — J4489 Other specified chronic obstructive pulmonary disease: Secondary | ICD-10-CM | POA: Insufficient documentation

## 2012-04-24 DIAGNOSIS — R490 Dysphonia: Secondary | ICD-10-CM | POA: Insufficient documentation

## 2012-04-24 DIAGNOSIS — R222 Localized swelling, mass and lump, trunk: Secondary | ICD-10-CM

## 2012-04-24 DIAGNOSIS — I10 Essential (primary) hypertension: Secondary | ICD-10-CM | POA: Insufficient documentation

## 2012-04-24 DIAGNOSIS — R0609 Other forms of dyspnea: Secondary | ICD-10-CM | POA: Insufficient documentation

## 2012-04-24 DIAGNOSIS — J398 Other specified diseases of upper respiratory tract: Secondary | ICD-10-CM

## 2012-04-24 DIAGNOSIS — R942 Abnormal results of pulmonary function studies: Secondary | ICD-10-CM | POA: Insufficient documentation

## 2012-04-24 HISTORY — PX: VIDEO BRONCHOSCOPY: SHX5072

## 2012-04-24 SURGERY — VIDEO BRONCHOSCOPY WITHOUT FLUORO
Anesthesia: Moderate Sedation | Laterality: Bilateral

## 2012-04-24 MED ORDER — MIDAZOLAM HCL 10 MG/2ML IJ SOLN
INTRAMUSCULAR | Status: DC | PRN
  Administered 2012-04-24 (×3): 1 mg via INTRAVENOUS

## 2012-04-24 MED ORDER — SODIUM CHLORIDE 0.9 % IV SOLN
INTRAVENOUS | Status: DC
  Administered 2012-04-24: 10:00:00 via INTRAVENOUS

## 2012-04-24 MED ORDER — MIDAZOLAM HCL 5 MG/ML IJ SOLN
INTRAMUSCULAR | Status: AC
  Filled 2012-04-24: qty 2

## 2012-04-24 MED ORDER — LIDOCAINE HCL 2 % EX GEL
CUTANEOUS | Status: DC | PRN
  Administered 2012-04-24: 1

## 2012-04-24 MED ORDER — LIDOCAINE HCL 1 % IJ SOLN
INTRAMUSCULAR | Status: DC | PRN
  Administered 2012-04-24: 6 mL via RESPIRATORY_TRACT

## 2012-04-24 MED ORDER — FENTANYL CITRATE 0.05 MG/ML IJ SOLN
INTRAMUSCULAR | Status: DC | PRN
  Administered 2012-04-24 (×3): 25 ug via INTRAVENOUS

## 2012-04-24 MED ORDER — PHENYLEPHRINE HCL 0.25 % NA SOLN
NASAL | Status: DC | PRN
  Administered 2012-04-24: 1 via NASAL

## 2012-04-24 MED ORDER — FENTANYL CITRATE 0.05 MG/ML IJ SOLN
INTRAMUSCULAR | Status: AC
  Filled 2012-04-24: qty 4

## 2012-04-24 NOTE — Op Note (Signed)
Indication: Appearance of tracheal polyp in this 67 year old with chronic hoarseness of voice  Written informed consent was obtained from the patient prior to the procedure. The risks of the procedure including coughing, bleeding and a small chance of lung cancer requiring a chest tube were discussed with the patient in great detail and evidenced understanding.  3 mg of Versed and  of fentanyl were used in divided doses during the procedure. Bronchoscope was inserted from the right Nare. The upper airway appeared normal. Vocal cord showed bowing & complete occlusion of the aperture during expiration (see picture). The trachea bronchial tree was then examined to the subsegmental level. Mild white secretions were noted. No endotracheal or endobronchial lesions were noted.  She was awake and alert in the end of the procedure.  ALVA,RAKESH V.

## 2012-04-24 NOTE — H&P (View-Only) (Signed)
  Subjective:    Patient ID: [name], female    DOB: May 03, 1945, 67 y.o.   MRN: [id number]  HPI 67 year old one pack per day smoker referred for evaluation of dyspnea and abnormal CT scan. She had a recent pharyngitis and bronchitic episode the was treated with prednisone, Levaquin And ENT referral was advised for throat pain. Chest x-ray showed hyperinflation Ct chest 12/23 showed Moderately severe diffuse centrilobular emphysema & Stable scarring in both lung apices.  A polypoid lesion was seen within the tracheal air shadow. Coronary artery calcifications were noted. Note, CT scan from 8/11 had shown a smaller polypoid lesion in the left wall of her trachea. She reports hoarseness all her life and dyspnea and exertion. She reports mostly dry cough with occasional clear sputum. Albuterol nebulizer 2-3 times daily is helping her breathing better. She's lost about 10 pounds in the last one year. Spirometry showed severe airway obstruction with FEV1 of 39%- 0.82 with FVC of 1.39 (52%) Labs showed WBC count of 10.1, hemoglobin 14, BUN/creatinine of 9/0.8, normal LFTs and TSH, albumin level is surprisingly 4.8.   Past Medical History  Diagnosis Date  . Asthma   . Hypertension   . GERD (gastroesophageal reflux disease)   . Stroke     was on Plavix - not now  . Carotid artery aneurysm 03/04/2012  . COPD (chronic obstructive pulmonary disease)   . DJD (degenerative joint disease)     No past surgical history on file.  Allergies  Allergen Reactions  . Sulfa Antibiotics     History   Social History  . Marital Status: Single    Spouse Name: N/A    Number of Children: N/A  . Years of Education: N/A   Occupational History  . retired    Social History Main Topics  . Smoking status: Current Every Day Smoker -- 0.5 packs/day for 30 years  . Smokeless tobacco: Not on file  . Alcohol Use: No  . Drug Use: No  . Sexually Active: Not on file   Other Topics Concern  . Not on file    Social History Narrative  . No narrative on file      Review of Systems  Constitutional: Negative for appetite change and unexpected weight change.  HENT: Negative for ear pain, congestion, sneezing, trouble swallowing and dental problem.   Respiratory: Positive for cough and shortness of breath.   Cardiovascular: Negative for chest pain, palpitations and leg swelling.  Gastrointestinal: Negative for abdominal pain.  Skin: Negative for rash.  Neurological: Negative for headaches.  Psychiatric/Behavioral: Negative for dysphoric mood. The patient is not nervous/anxious.        Objective:   Physical Exam        Assessment & Plan:

## 2012-04-24 NOTE — Interval H&P Note (Signed)
Ms Sokolow has presented today for procedure, with the diagnosis of tracheal lesion. The various methods of treatment have been discussed with the patient and family. After consideration of risks, benefits and other options for treatment, the patient has consented to Procedure(s) :  Videobronchoscopy .  The patient's history has been reviewed, patient examined, no change in status, stable for surgery. I have reviewed the patient's chart and labs. Questions were answered to the patient's satisfaction

## 2012-04-24 NOTE — Progress Notes (Signed)
B/P taken again  144/95

## 2012-04-25 ENCOUNTER — Encounter (HOSPITAL_COMMUNITY): Payer: Self-pay | Admitting: Pulmonary Disease

## 2012-04-30 ENCOUNTER — Telehealth: Payer: Self-pay | Admitting: Pulmonary Disease

## 2012-04-30 NOTE — Telephone Encounter (Signed)
lmomtcb x1 for pt 

## 2012-04-30 NOTE — Telephone Encounter (Signed)
I have reviewed dr Allene Pyo note. SHe has vocal cord polyps - she should keep her appt with him & have this taken care of-  It will help her voice &  Breathing, in my opinion

## 2012-05-02 NOTE — Telephone Encounter (Signed)
I spoke with pt and is aware of RA Recs. She voiced her understanding and needed nothing further

## 2012-07-31 DIAGNOSIS — L819 Disorder of pigmentation, unspecified: Secondary | ICD-10-CM | POA: Diagnosis not present

## 2012-07-31 DIAGNOSIS — D239 Other benign neoplasm of skin, unspecified: Secondary | ICD-10-CM | POA: Diagnosis not present

## 2012-07-31 DIAGNOSIS — Z85828 Personal history of other malignant neoplasm of skin: Secondary | ICD-10-CM | POA: Diagnosis not present

## 2012-07-31 DIAGNOSIS — L820 Inflamed seborrheic keratosis: Secondary | ICD-10-CM | POA: Diagnosis not present

## 2012-07-31 DIAGNOSIS — L570 Actinic keratosis: Secondary | ICD-10-CM | POA: Diagnosis not present

## 2012-10-28 DIAGNOSIS — M2040 Other hammer toe(s) (acquired), unspecified foot: Secondary | ICD-10-CM | POA: Diagnosis not present

## 2012-10-28 DIAGNOSIS — M216X9 Other acquired deformities of unspecified foot: Secondary | ICD-10-CM | POA: Diagnosis not present

## 2012-10-31 DIAGNOSIS — M6530 Trigger finger, unspecified finger: Secondary | ICD-10-CM | POA: Diagnosis not present

## 2012-11-18 DIAGNOSIS — M2040 Other hammer toe(s) (acquired), unspecified foot: Secondary | ICD-10-CM | POA: Diagnosis not present

## 2012-11-25 ENCOUNTER — Encounter (INDEPENDENT_AMBULATORY_CARE_PROVIDER_SITE_OTHER): Payer: Medicare Other | Admitting: Surgery

## 2012-12-01 ENCOUNTER — Encounter (INDEPENDENT_AMBULATORY_CARE_PROVIDER_SITE_OTHER): Payer: Medicare Other | Admitting: Surgery

## 2012-12-02 DIAGNOSIS — R300 Dysuria: Secondary | ICD-10-CM | POA: Diagnosis not present

## 2012-12-02 DIAGNOSIS — R197 Diarrhea, unspecified: Secondary | ICD-10-CM | POA: Diagnosis not present

## 2012-12-03 ENCOUNTER — Encounter (HOSPITAL_COMMUNITY): Payer: Self-pay

## 2012-12-03 ENCOUNTER — Emergency Department (HOSPITAL_COMMUNITY)
Admission: EM | Admit: 2012-12-03 | Discharge: 2012-12-03 | Discharge status: Against Medical Advice | Payer: Medicare Other | Attending: Emergency Medicine | Admitting: Emergency Medicine

## 2012-12-03 DIAGNOSIS — J449 Chronic obstructive pulmonary disease, unspecified: Secondary | ICD-10-CM | POA: Diagnosis not present

## 2012-12-03 DIAGNOSIS — J4489 Other specified chronic obstructive pulmonary disease: Secondary | ICD-10-CM | POA: Insufficient documentation

## 2012-12-03 DIAGNOSIS — R5381 Other malaise: Secondary | ICD-10-CM | POA: Diagnosis not present

## 2012-12-03 DIAGNOSIS — R110 Nausea: Secondary | ICD-10-CM | POA: Insufficient documentation

## 2012-12-03 DIAGNOSIS — F17200 Nicotine dependence, unspecified, uncomplicated: Secondary | ICD-10-CM | POA: Diagnosis not present

## 2012-12-03 DIAGNOSIS — I10 Essential (primary) hypertension: Secondary | ICD-10-CM | POA: Insufficient documentation

## 2012-12-03 DIAGNOSIS — R300 Dysuria: Secondary | ICD-10-CM | POA: Diagnosis not present

## 2012-12-03 LAB — URINALYSIS, ROUTINE W REFLEX MICROSCOPIC
Bilirubin Urine: NEGATIVE
Glucose, UA: NEGATIVE mg/dL
Ketones, ur: NEGATIVE mg/dL
pH: 6 (ref 5.0–8.0)

## 2012-12-03 LAB — CBC WITH DIFFERENTIAL/PLATELET
Basophils Relative: 0 % (ref 0–1)
Eosinophils Absolute: 0 10*3/uL (ref 0.0–0.7)
Hemoglobin: 13.4 g/dL (ref 12.0–15.0)
Lymphs Abs: 0.3 10*3/uL — ABNORMAL LOW (ref 0.7–4.0)
MCH: 32 pg (ref 26.0–34.0)
MCHC: 35.6 g/dL (ref 30.0–36.0)
Neutro Abs: 7.1 10*3/uL (ref 1.7–7.7)
Neutrophils Relative %: 91 % — ABNORMAL HIGH (ref 43–77)
Platelets: 233 10*3/uL (ref 150–400)
RBC: 4.19 MIL/uL (ref 3.87–5.11)

## 2012-12-03 LAB — URINE MICROSCOPIC-ADD ON

## 2012-12-03 LAB — COMPREHENSIVE METABOLIC PANEL
ALT: 14 U/L (ref 0–35)
Albumin: 4 g/dL (ref 3.5–5.2)
Alkaline Phosphatase: 66 U/L (ref 39–117)
Chloride: 89 mEq/L — ABNORMAL LOW (ref 96–112)
Potassium: 3.6 mEq/L (ref 3.5–5.1)
Sodium: 127 mEq/L — ABNORMAL LOW (ref 135–145)
Total Bilirubin: 0.2 mg/dL — ABNORMAL LOW (ref 0.3–1.2)
Total Protein: 7.3 g/dL (ref 6.0–8.3)

## 2012-12-03 NOTE — ED Notes (Signed)
Pt reports she fell 0230 this am unable to get off the floor until 1100 this, pt reports her daughter called and she told her she fell. Pt reports tremors, nausea, weakness, burning with urination. Pt had a UA yesterday ather pcp, they gave her a prescription for ABX, UC is concerned for dehydration

## 2012-12-03 NOTE — ED Notes (Signed)
Daughter has returned and states pt does not want to wait any longer.  Pt c/o back pain from sitting and daughter states that pt will follow-up with PCP tomorrow.  Encouraged pt to stay to get lab results and see MD.  Pt asked if she can return tomorrow.  Extensive conversation with pt and daughter encouraging pt to stay for treatment.  Pt states she is leaving.

## 2012-12-03 NOTE — ED Notes (Addendum)
[name], Daughter, [phone]- states she is going to run errands and then come back

## 2012-12-07 ENCOUNTER — Encounter (HOSPITAL_COMMUNITY): Payer: Self-pay | Admitting: Nurse Practitioner

## 2012-12-07 ENCOUNTER — Emergency Department (HOSPITAL_COMMUNITY): Payer: Medicare Other

## 2012-12-07 ENCOUNTER — Emergency Department (HOSPITAL_COMMUNITY)
Admission: EM | Admit: 2012-12-07 | Discharge: 2012-12-07 | Disposition: A | Discharge status: Home / Self Care | Payer: Medicare Other | Attending: Emergency Medicine | Admitting: Emergency Medicine

## 2012-12-07 DIAGNOSIS — R5381 Other malaise: Secondary | ICD-10-CM | POA: Insufficient documentation

## 2012-12-07 DIAGNOSIS — J45909 Unspecified asthma, uncomplicated: Secondary | ICD-10-CM | POA: Diagnosis not present

## 2012-12-07 DIAGNOSIS — Z8739 Personal history of other diseases of the musculoskeletal system and connective tissue: Secondary | ICD-10-CM | POA: Insufficient documentation

## 2012-12-07 DIAGNOSIS — J438 Other emphysema: Secondary | ICD-10-CM | POA: Diagnosis not present

## 2012-12-07 DIAGNOSIS — Y92009 Unspecified place in unspecified non-institutional (private) residence as the place of occurrence of the external cause: Secondary | ICD-10-CM | POA: Insufficient documentation

## 2012-12-07 DIAGNOSIS — K219 Gastro-esophageal reflux disease without esophagitis: Secondary | ICD-10-CM | POA: Insufficient documentation

## 2012-12-07 DIAGNOSIS — J449 Chronic obstructive pulmonary disease, unspecified: Secondary | ICD-10-CM | POA: Insufficient documentation

## 2012-12-07 DIAGNOSIS — F17200 Nicotine dependence, unspecified, uncomplicated: Secondary | ICD-10-CM | POA: Diagnosis not present

## 2012-12-07 DIAGNOSIS — IMO0002 Reserved for concepts with insufficient information to code with codable children: Secondary | ICD-10-CM | POA: Insufficient documentation

## 2012-12-07 DIAGNOSIS — Z8673 Personal history of transient ischemic attack (TIA), and cerebral infarction without residual deficits: Secondary | ICD-10-CM | POA: Insufficient documentation

## 2012-12-07 DIAGNOSIS — I10 Essential (primary) hypertension: Secondary | ICD-10-CM | POA: Insufficient documentation

## 2012-12-07 DIAGNOSIS — J4489 Other specified chronic obstructive pulmonary disease: Secondary | ICD-10-CM | POA: Insufficient documentation

## 2012-12-07 DIAGNOSIS — R531 Weakness: Secondary | ICD-10-CM

## 2012-12-07 DIAGNOSIS — Y9389 Activity, other specified: Secondary | ICD-10-CM | POA: Insufficient documentation

## 2012-12-07 DIAGNOSIS — Z79899 Other long term (current) drug therapy: Secondary | ICD-10-CM | POA: Insufficient documentation

## 2012-12-07 DIAGNOSIS — R296 Repeated falls: Secondary | ICD-10-CM | POA: Insufficient documentation

## 2012-12-07 LAB — COMPREHENSIVE METABOLIC PANEL
AST: 151 U/L — ABNORMAL HIGH (ref 0–37)
BUN: 13 mg/dL (ref 6–23)
CO2: 28 mEq/L (ref 19–32)
Calcium: 8.9 mg/dL (ref 8.4–10.5)
Chloride: 89 mEq/L — ABNORMAL LOW (ref 96–112)
Creatinine, Ser: 0.99 mg/dL (ref 0.50–1.10)
GFR calc Af Amer: 66 mL/min — ABNORMAL LOW (ref >90)
GFR calc non Af Amer: 57 mL/min — ABNORMAL LOW (ref >90)
Glucose, Bld: 119 mg/dL — ABNORMAL HIGH (ref 70–99)
Total Bilirubin: 0.4 mg/dL (ref 0.3–1.2)

## 2012-12-07 LAB — CBC WITH DIFFERENTIAL/PLATELET
Eosinophils Relative: 2 % (ref 0–5)
HCT: 34.5 % — ABNORMAL LOW (ref 36.0–46.0)
Hemoglobin: 12.2 g/dL (ref 12.0–15.0)
Lymphocytes Relative: 15 % (ref 12–46)
Lymphs Abs: 0.8 10*3/uL (ref 0.7–4.0)
MCV: 86.7 fL (ref 78.0–100.0)
Monocytes Absolute: 1.7 10*3/uL — ABNORMAL HIGH (ref 0.1–1.0)
Monocytes Relative: 30 % — ABNORMAL HIGH (ref 3–12)
Neutro Abs: 3 10*3/uL (ref 1.7–7.7)
RDW: 12.9 % (ref 11.5–15.5)
WBC: 5.7 10*3/uL (ref 4.0–10.5)

## 2012-12-07 LAB — URINALYSIS, ROUTINE W REFLEX MICROSCOPIC
Bilirubin Urine: NEGATIVE
Ketones, ur: NEGATIVE mg/dL
Nitrite: NEGATIVE
Protein, ur: NEGATIVE mg/dL
pH: 6 (ref 5.0–8.0)

## 2012-12-07 LAB — TROPONIN I: Troponin I: <0.3 ng/mL (ref <0.30)

## 2012-12-07 LAB — POCT I-STAT, CHEM 8
Calcium, Ion: 1.14 mmol/L (ref 1.13–1.30)
Creatinine, Ser: 1.2 mg/dL — ABNORMAL HIGH (ref 0.50–1.10)
Glucose, Bld: 141 mg/dL — ABNORMAL HIGH (ref 70–99)
Hemoglobin: 12.2 g/dL (ref 12.0–15.0)
Potassium: 3.4 mEq/L — ABNORMAL LOW (ref 3.5–5.1)

## 2012-12-07 LAB — LIPASE, BLOOD: Lipase: 14 U/L (ref 11–59)

## 2012-12-07 MED ORDER — SODIUM CHLORIDE 0.9 % IV BOLUS (SEPSIS)
1000.0000 mL | Freq: Once | INTRAVENOUS | Status: AC
  Administered 2012-12-07: 1000 mL via INTRAVENOUS

## 2012-12-07 MED ORDER — PREDNISONE 20 MG PO TABS
40.0000 mg | ORAL_TABLET | Freq: Once | ORAL | Status: AC
  Administered 2012-12-07: 40 mg via ORAL
  Filled 2012-12-07: qty 2

## 2012-12-07 MED ORDER — PREDNISONE 20 MG PO TABS: 40.0000 mg | ORAL_TABLET | Freq: Every day | ORAL | Status: AC

## 2012-12-07 NOTE — ED Notes (Signed)
Family states pt fell at home Wednesday and had a very hard time getting up. Family took pt to PCP for eval on Wednesday and he sent her to ER for workup but the pt got here Wednesday and "didnt feel like waiting so she went home." family states she has been increasingly weak since and they want her checked out today. Pt reports pain in her back where she fell on it Wednesday. Denies any other complaints. A&Ox4, resp e/u.

## 2012-12-07 NOTE — ED Provider Notes (Signed)
CSN: [id number]     Arrival date & time 12/07/12  1130 History   First MD Initiated Contact with Patient 12/07/12 1217     Chief Complaint  Patient presents with  . Weakness   (Consider location/radiation/quality/duration/timing/severity/associated sxs/prior Treatment) HPI Patient presents with progressive weakness, and following a fall that occurred earlier this week. The fall occurred 4 days ago, without precipitant.  It seems to have been mechanical. The patient states that she was leaning against a countertop, fell to the floor with no head trauma, no loss of consciousness.  She did suffer multiple abrasions to her back. Since that time she has been ambulatory, with no new headache, confusion, disorientation or new falls. However, the patient notes that since the fall she has been progressively weak with pain in her lower back.  No unilateral weakness, numbness speech difficulty, no dyspnea, no abdominal pain. Patient denies to medications, new activities, new diet. There is a friend with the patient to corroborate this history of present illness.  Past Medical History  Diagnosis Date  . Asthma   . Hypertension   . GERD (gastroesophageal reflux disease)   . Stroke     was on Plavix - not now  . Carotid artery aneurysm 03/04/2012  . COPD (chronic obstructive pulmonary disease)   . DJD (degenerative joint disease)    Past Surgical History  Procedure Laterality Date  . Video bronchoscopy  04/24/2012    Procedure: VIDEO BRONCHOSCOPY WITHOUT FLUORO;  Surgeon: Oretha Milch, MD;  Location: [facility] ENDOSCOPY;  Service: Cardiopulmonary;  Laterality: Bilateral;   Family History  Problem Relation Age of Onset  . Breast cancer Mother   . Breast cancer Sister    History  Substance Use Topics  . Smoking status: Current Every Day Smoker -- 0.50 packs/day for 30 years  . Smokeless tobacco: Not on file  . Alcohol Use: No   OB History   Grav Para Term Preterm Abortions TAB SAB Ect Mult  Living                 Review of Systems  Constitutional:       Per HPI, otherwise negative  HENT:       Per HPI, otherwise negative  Respiratory:       Per HPI, otherwise negative  Cardiovascular:       Per HPI, otherwise negative  Gastrointestinal: Negative for nausea and vomiting.  Endocrine:       Negative aside from HPI  Genitourinary:       Neg aside from HPI   Musculoskeletal:       Per HPI, otherwise negative  Skin: Positive for pallor.  Neurological: Positive for speech difficulty (history of vocal cord polyp) and weakness. Negative for tremors, seizures and syncope.  Hematological: Bruises/bleeds easily.    Allergies  Sulfa antibiotics  Home Medications   Current Outpatient Rx  Name  Route  Sig  Dispense  Refill  . albuterol (PROVENTIL HFA;VENTOLIN HFA) 108 (90 BASE) MCG/ACT inhaler   Inhalation   Inhale 2 puffs into the lungs every 6 (six) hours as needed for shortness of breath.          Marland Kitchen albuterol (PROVENTIL) (2.5 MG/3ML) 0.083% nebulizer solution   Nebulization   Take 2.5 mg by nebulization every 6 (six) hours as needed.         Marland Kitchen AMLODIPINE BESYLATE PO   Oral   Take 0.5 tablets by mouth daily.          Marland Kitchen  Fluticasone-Salmeterol (ADVAIR) 250-50 MCG/DOSE AEPB   Inhalation   Inhale 1 puff into the lungs every 12 (twelve) hours.         . gabapentin (NEURONTIN) 300 MG capsule   Oral   Take 300 mg by mouth 3 (three) times daily. 1 tablet twice a day         . HYDROcodone-acetaminophen (NORCO/VICODIN) 5-325 MG per tablet   Oral   Take 1 tablet by mouth every 6 (six) hours as needed.          . lansoprazole (PREVACID) 30 MG capsule   Oral   Take 30 mg by mouth daily.          Marland Kitchen LORazepam (ATIVAN) 0.5 MG tablet   Oral   Take 0.5 mg by mouth every 6 (six) hours as needed for anxiety. 3-4 daily as needed         . MIRTAZAPINE PO   Oral   Take 1 tablet by mouth daily as needed. For dizzy         . montelukast (SINGULAIR) 10 MG  tablet   Oral   Take 10 mg by mouth daily.          . nitrofurantoin, macrocrystal-monohydrate, (MACROBID) 100 MG capsule   Oral   Take 100 mg by mouth 2 (two) times daily.         Marland Kitchen oxybutynin (DITROPAN) 5 MG tablet   Oral   Take 5 mg by mouth daily.          . QUEtiapine (SEROQUEL) 400 MG tablet   Oral   Take 400 mg by mouth at bedtime.          . QUINAPRIL HCL PO   Oral   Take 0.5 tablets by mouth daily as needed. High blood pressure         . tiotropium (SPIRIVA) 18 MCG inhalation capsule   Inhalation   Place 18 mcg into inhaler and inhale daily.         . traMADol (ULTRAM) 50 MG tablet   Oral   Take 50 mg by mouth 3 (three) times daily.           BP 116/66  Pulse 109  Temp(Src) 98.2 F (36.8 C) (Oral)  Resp 18  SpO2 100% Physical Exam  Nursing note and vitals reviewed. Constitutional: She is oriented to person, place, and time. She appears well-developed and well-nourished. No distress.  HENT:  Head: Normocephalic and atraumatic.  Eyes: Conjunctivae and EOM are normal.  Neck: Full passive range of motion without pain. Neck supple.  Cardiovascular: Normal rate and regular rhythm.   Pulmonary/Chest: Effort normal and breath sounds normal. No stridor. No respiratory distress.  Abdominal: She exhibits no distension. There is no tenderness. There is no rebound.  Musculoskeletal: She exhibits no edema.  No gross deformities, the patient has multiple areas of abrasion, ecchymosis, hematoma scattered  Neurological: She is alert and oriented to person, place, and time. No cranial nerve deficit. She exhibits abnormal muscle tone. Coordination normal.  Skin: Skin is warm and dry. There is pallor.  On the right lateral paraspinal surface there are multiple abrasions, no surrounding or confluent erythema. On both forearms are multiple areas of ecchymosis.   Psychiatric: She has a normal mood and affect.    ED Course  Procedures (including critical care  time) Labs Review Labs Reviewed  CBC WITH DIFFERENTIAL  COMPREHENSIVE METABOLIC PANEL  LIPASE, BLOOD  TROPONIN I  URINALYSIS, ROUTINE W REFLEX  MICROSCOPIC  CK   Imaging Review No results found.  Pulse ox 99% room air normal Cardiac 110 sinus tachycardia abnormal  3:41 PM Patient substantially better in appearance.  She is eating food.   EKG sinus rhythm, rate 83, unremarkable.    4:38 PM Patient is improved in appearance, states that she feels better. There is trace wheezing in the left lung. Patient now states that she does not take her respiratory therapy medication with any frequency.  MDM  No diagnosis found. This patient presents with weakness following a fall that occurred several days ago.  On exam she is neurologically intact, listless.  Patient presents initially with IV fluid rehydration.  Patient's labs suggest dehydration.  However, there is little suggestion of significant ongoing infection.  Following significant improvement, tolerance of oral intake, and with hemodynamic improvement, patient was discharged in stable condition to follow up with her primary care physician.    Gerhard Munch, MD 12/07/12 1640

## 2012-12-09 DIAGNOSIS — L578 Other skin changes due to chronic exposure to nonionizing radiation: Secondary | ICD-10-CM | POA: Diagnosis not present

## 2012-12-09 DIAGNOSIS — Z85828 Personal history of other malignant neoplasm of skin: Secondary | ICD-10-CM | POA: Diagnosis not present

## 2012-12-09 DIAGNOSIS — M2040 Other hammer toe(s) (acquired), unspecified foot: Secondary | ICD-10-CM | POA: Diagnosis not present

## 2012-12-09 DIAGNOSIS — L570 Actinic keratosis: Secondary | ICD-10-CM | POA: Diagnosis not present

## 2012-12-09 DIAGNOSIS — L820 Inflamed seborrheic keratosis: Secondary | ICD-10-CM | POA: Diagnosis not present

## 2012-12-10 ENCOUNTER — Encounter (INDEPENDENT_AMBULATORY_CARE_PROVIDER_SITE_OTHER): Payer: Medicare Other | Admitting: Surgery

## 2012-12-22 ENCOUNTER — Encounter (INDEPENDENT_AMBULATORY_CARE_PROVIDER_SITE_OTHER): Payer: Medicare Other | Admitting: Surgery

## 2012-12-22 ENCOUNTER — Encounter (INDEPENDENT_AMBULATORY_CARE_PROVIDER_SITE_OTHER): Payer: Self-pay

## 2012-12-23 DIAGNOSIS — B351 Tinea unguium: Secondary | ICD-10-CM | POA: Diagnosis not present

## 2012-12-23 DIAGNOSIS — L570 Actinic keratosis: Secondary | ICD-10-CM | POA: Diagnosis not present

## 2012-12-23 DIAGNOSIS — Z85828 Personal history of other malignant neoplasm of skin: Secondary | ICD-10-CM | POA: Diagnosis not present

## 2012-12-23 DIAGNOSIS — M79609 Pain in unspecified limb: Secondary | ICD-10-CM | POA: Diagnosis not present

## 2012-12-24 ENCOUNTER — Encounter: Payer: Self-pay | Admitting: General Surgery

## 2012-12-24 ENCOUNTER — Ambulatory Visit (INDEPENDENT_AMBULATORY_CARE_PROVIDER_SITE_OTHER): Payer: Medicare Other | Admitting: General Surgery

## 2012-12-24 VITALS — BP 140/82 | HR 80 | Resp 14 | Ht 62.0 in | Wt 90.0 lb

## 2012-12-24 DIAGNOSIS — K4090 Unilateral inguinal hernia, without obstruction or gangrene, not specified as recurrent: Secondary | ICD-10-CM

## 2012-12-24 NOTE — Patient Instructions (Addendum)

## 2012-12-24 NOTE — Progress Notes (Signed)
Patient ID: [name], female   DOB: May 08, 1945, 67 y.o.   MRN: [id number]  No chief complaint on file.   HPI [name] is a 67 y.o. female here today for an evaluation of an right inguinal hernia. Patient states she noticed it about an year and four months. Patient states last week had a lot of pain.  HPI  Past Medical History  Diagnosis Date  . Asthma   . Hypertension   . GERD (gastroesophageal reflux disease)   . Stroke     was on Plavix - not now  . Carotid artery aneurysm 03/04/2012  . COPD (chronic obstructive pulmonary disease)   . DJD (degenerative joint disease)     Past Surgical History  Procedure Laterality Date  . Video bronchoscopy  04/24/2012    Procedure: VIDEO BRONCHOSCOPY WITHOUT FLUORO;  Surgeon: Oretha Milch, MD;  Location: [facility] ENDOSCOPY;  Service: Cardiopulmonary;  Laterality: Bilateral;  . Tubal ligation  30 years  . Tonsillectomy    . Colonoscopy  2004    Family History  Problem Relation Age of Onset  . Breast cancer Mother   . Breast cancer Sister     Social History History  Substance Use Topics  . Smoking status: Current Every Day Smoker -- 0.50 packs/day for 30 years  . Smokeless tobacco: Never Used  . Alcohol Use: No    Allergies  Allergen Reactions  . Amoxapine And Related Nausea And Vomiting  . Ciprocinonide [Fluocinolone]   . Doxycycline Nausea And Vomiting  . Erythromycin Hives  . Sulfa Antibiotics Other (See Comments)    Unknown-reaction as a child  . Zofran [Ondansetron Hcl] Nausea And Vomiting    Current Outpatient Prescriptions  Medication Sig Dispense Refill  . albuterol (PROVENTIL HFA;VENTOLIN HFA) 108 (90 BASE) MCG/ACT inhaler Inhale 2 puffs into the lungs every 6 (six) hours as needed for shortness of breath.       Marland Kitchen albuterol (PROVENTIL) (2.5 MG/3ML) 0.083% nebulizer solution Take 2.5 mg by nebulization every 6 (six) hours as needed.      Marland Kitchen aspirin EC 81 MG tablet Take 81 mg by mouth daily.      .  Fluticasone-Salmeterol (ADVAIR) 250-50 MCG/DOSE AEPB Inhale 1 puff into the lungs every 12 (twelve) hours as needed. For asthma/copd      . gabapentin (NEURONTIN) 300 MG capsule Take 300 mg by mouth 3 (three) times daily.       . lansoprazole (PREVACID) 30 MG capsule Take 30 mg by mouth daily.       Marland Kitchen LORazepam (ATIVAN) 0.5 MG tablet Take 0.5 mg by mouth every 6 (six) hours as needed for anxiety. For anxiety      . metoCLOPramide (REGLAN) 5 MG tablet Take 1 tablet by mouth daily.      . mirtazapine (REMERON) 45 MG tablet Take 45 mg by mouth at bedtime.      . montelukast (SINGULAIR) 10 MG tablet Take 10 mg by mouth daily.       . nitrofurantoin, macrocrystal-monohydrate, (MACROBID) 100 MG capsule Take 100 mg by mouth 2 (two) times daily. Filled on 12/02/12 x7 days      . omeprazole (PRILOSEC) 20 MG capsule Take 1 capsule by mouth daily.      Marland Kitchen oxybutynin (DITROPAN) 5 MG tablet Take 5 mg by mouth daily.       . QUEtiapine (SEROQUEL) 400 MG tablet Take 400 mg by mouth at bedtime.       . quinapril (ACCUPRIL)  40 MG tablet Take 40 mg by mouth daily.      Marland Kitchen tiotropium (SPIRIVA) 18 MCG inhalation capsule Place 18 mcg into inhaler and inhale daily as needed. For copd      . traMADol (ULTRAM) 50 MG tablet Take 50 mg by mouth 3 (three) times daily as needed. For pain       No current facility-administered medications for this visit.    Review of Systems Review of Systems  Constitutional: Negative.   Respiratory: Negative.   Cardiovascular: Negative.   Gastrointestinal: Negative.     Blood pressure 140/82, pulse 80, resp. rate 14, height 5\' 2"  (1.575 m), weight 90 lb (40.824 kg).  Physical Exam Physical Exam  Constitutional: She is oriented to person, place, and time. She appears well-developed and well-nourished.  Eyes: Conjunctivae are normal. No scleral icterus.  Neck: Neck supple. No mass and no thyromegaly present.  Cardiovascular: Normal rate, regular rhythm and normal heart sounds.    Pulmonary/Chest: Breath sounds normal.  Abdominal: Soft. Normal appearance and bowel sounds are normal. There is no hepatomegaly. There is no tenderness. A hernia is present. Hernia confirmed positive in the right inguinal area.  Lymphadenopathy:    She has no cervical adenopathy.  Neurological: She is alert and oriented to person, place, and time.  Skin: Skin is warm and dry.    Data Reviewed None   Assessment   Right inguinal hernia, reducible, symptomatic. She has moderate to severe COPD., and has hx of strokes in past.     Plan    Patient to have an right inguinal hernia repair. Pt advised fully and she is agreeable. This can be done with local anesthesia and sedation.     This patient's surgery has been scheduled for 01-08-13 at [facility].  SANKAR,SEEPLAPUTHUR G 12/24/2012, 6:46 PM

## 2012-12-25 LAB — LAB REPORT - SCANNED
BUN: 9
Calcium: 8.7 mg/dL (ref 8.7–10.7)
Chloride: 102 mmol/L (ref 99–108)
Glucose: 85 mg/dL
HCT: 36.2
MCH: 31.4 pg (ref 26.0–34.0)
MCHC: 34 g/dL (ref 30–37)
Platelets: 253
Potassium: 3.2 mmol/L — AB (ref 3.4–5.3)
RDW: 14.7 % — AB (ref 11.5–14.5)
Sodium: 137 mmol/L (ref 137–147)

## 2012-12-29 ENCOUNTER — Other Ambulatory Visit: Payer: Self-pay | Admitting: General Surgery

## 2012-12-29 DIAGNOSIS — K4090 Unilateral inguinal hernia, without obstruction or gangrene, not specified as recurrent: Secondary | ICD-10-CM

## 2013-01-01 ENCOUNTER — Ambulatory Visit: Payer: Self-pay | Admitting: General Surgery

## 2013-01-01 DIAGNOSIS — K4090 Unilateral inguinal hernia, without obstruction or gangrene, not specified as recurrent: Secondary | ICD-10-CM | POA: Diagnosis not present

## 2013-01-01 DIAGNOSIS — I119 Hypertensive heart disease without heart failure: Secondary | ICD-10-CM | POA: Diagnosis not present

## 2013-01-01 DIAGNOSIS — Z01812 Encounter for preprocedural laboratory examination: Secondary | ICD-10-CM | POA: Diagnosis not present

## 2013-01-01 DIAGNOSIS — Z01810 Encounter for preprocedural cardiovascular examination: Secondary | ICD-10-CM | POA: Diagnosis not present

## 2013-01-01 LAB — CBC WITH DIFFERENTIAL/PLATELET
Basophil #: 0.1 10*3/uL (ref 0.0–0.1)
Eosinophil #: 0.2 10*3/uL (ref 0.0–0.7)
HGB: 12.4 g/dL (ref 12.0–16.0)
Lymphocyte #: 2.5 10*3/uL (ref 1.0–3.6)
Lymphocyte %: 28 %
MCH: 31.4 pg (ref 26.0–34.0)
MCV: 91 fL (ref 80–100)
Monocyte #: 1.2 x10 3/mm — ABNORMAL HIGH (ref 0.2–0.9)
Monocyte %: 13.9 %
Neutrophil #: 4.9 10*3/uL (ref 1.4–6.5)
Neutrophil %: 54.7 %
RBC: 3.96 10*6/uL (ref 3.80–5.20)
RDW: 14.7 % — ABNORMAL HIGH (ref 11.5–14.5)
WBC: 8.9 10*3/uL (ref 3.6–11.0)

## 2013-01-01 LAB — BASIC METABOLIC PANEL
Anion Gap: 4 — ABNORMAL LOW (ref 7–16)
Chloride: 102 mmol/L (ref 98–107)
Co2: 31 mmol/L (ref 21–32)
Creatinine: 0.85 mg/dL (ref 0.60–1.30)
EGFR (African American): >60
EGFR (Non-African Amer.): >60
Osmolality: 272 (ref 275–301)
Potassium: 3.2 mmol/L — ABNORMAL LOW (ref 3.5–5.1)

## 2013-01-02 ENCOUNTER — Other Ambulatory Visit: Payer: Self-pay | Admitting: General Surgery

## 2013-01-02 ENCOUNTER — Telehealth: Payer: Self-pay | Admitting: *Deleted

## 2013-01-02 NOTE — Progress Notes (Signed)
This encounter was created in error - please disregard.

## 2013-01-02 NOTE — Telephone Encounter (Signed)
Patient's potassium level was low when checked at [facility] so a prescription has been called in to the patient's pharmacy for potassium chloride 20 meq #15 one po BID (no refills). This patient is aware of all instructions.

## 2013-01-02 NOTE — Telephone Encounter (Signed)
Patient's daughter called wanting to see if patient could ride to [place] to be at the daughter's home immediately after surgery. Also, she is concerned because daughter has stairs at her house and patient needs to go up the stairs to eat. What kind of restrictions will she have after surgery? Thanks.

## 2013-01-03 ENCOUNTER — Telehealth: Payer: Self-pay | Admitting: General Surgery

## 2013-01-03 NOTE — Telephone Encounter (Signed)
The patient called to request information regarding continuation of aspirin prior to planned hernia surgery. In light of the low-dose and past history of TIAs/strokes, she was encouraged to continue this medication.

## 2013-01-05 ENCOUNTER — Encounter: Payer: Self-pay | Admitting: General Surgery

## 2013-01-08 ENCOUNTER — Ambulatory Visit: Payer: Self-pay | Admitting: General Surgery

## 2013-01-08 ENCOUNTER — Encounter: Payer: Self-pay | Admitting: General Surgery

## 2013-01-08 DIAGNOSIS — Z882 Allergy status to sulfonamides status: Secondary | ICD-10-CM | POA: Diagnosis not present

## 2013-01-08 DIAGNOSIS — Z8679 Personal history of other diseases of the circulatory system: Secondary | ICD-10-CM | POA: Diagnosis not present

## 2013-01-08 DIAGNOSIS — K219 Gastro-esophageal reflux disease without esophagitis: Secondary | ICD-10-CM | POA: Diagnosis not present

## 2013-01-08 DIAGNOSIS — I10 Essential (primary) hypertension: Secondary | ICD-10-CM | POA: Diagnosis not present

## 2013-01-08 DIAGNOSIS — M1990 Unspecified osteoarthritis, unspecified site: Secondary | ICD-10-CM | POA: Diagnosis not present

## 2013-01-08 DIAGNOSIS — Z888 Allergy status to other drugs, medicaments and biological substances status: Secondary | ICD-10-CM | POA: Diagnosis not present

## 2013-01-08 DIAGNOSIS — G43909 Migraine, unspecified, not intractable, without status migrainosus: Secondary | ICD-10-CM | POA: Diagnosis not present

## 2013-01-08 DIAGNOSIS — Z79899 Other long term (current) drug therapy: Secondary | ICD-10-CM | POA: Diagnosis not present

## 2013-01-08 DIAGNOSIS — Z803 Family history of malignant neoplasm of breast: Secondary | ICD-10-CM | POA: Diagnosis not present

## 2013-01-08 DIAGNOSIS — Z7982 Long term (current) use of aspirin: Secondary | ICD-10-CM | POA: Diagnosis not present

## 2013-01-08 DIAGNOSIS — Z8673 Personal history of transient ischemic attack (TIA), and cerebral infarction without residual deficits: Secondary | ICD-10-CM | POA: Diagnosis not present

## 2013-01-08 DIAGNOSIS — J449 Chronic obstructive pulmonary disease, unspecified: Secondary | ICD-10-CM | POA: Diagnosis not present

## 2013-01-08 DIAGNOSIS — K4090 Unilateral inguinal hernia, without obstruction or gangrene, not specified as recurrent: Secondary | ICD-10-CM | POA: Diagnosis not present

## 2013-01-08 DIAGNOSIS — Z881 Allergy status to other antibiotic agents status: Secondary | ICD-10-CM | POA: Diagnosis not present

## 2013-01-08 DIAGNOSIS — Z9981 Dependence on supplemental oxygen: Secondary | ICD-10-CM | POA: Diagnosis not present

## 2013-01-08 DIAGNOSIS — F17200 Nicotine dependence, unspecified, uncomplicated: Secondary | ICD-10-CM | POA: Diagnosis not present

## 2013-01-13 ENCOUNTER — Telehealth: Payer: Self-pay | Admitting: *Deleted

## 2013-01-13 ENCOUNTER — Ambulatory Visit: Payer: Self-pay | Admitting: Podiatry

## 2013-01-13 NOTE — Telephone Encounter (Signed)
I called and talked with the patient regarding her pain medications. (right inguinal hernia done 01-08-13) She has 6 tablets left.  She is willing to try tramadol and ibuprofen for pain as a supplement.  Aware unable to call in hydrocodone to pharmacy per Dr. Evette Cristal.  She will try using a heating pad as well for comfort. Follow up appt is 01-15-13.

## 2013-01-15 ENCOUNTER — Encounter: Payer: Self-pay | Admitting: General Surgery

## 2013-01-15 ENCOUNTER — Ambulatory Visit (INDEPENDENT_AMBULATORY_CARE_PROVIDER_SITE_OTHER): Payer: Medicare Other | Admitting: General Surgery

## 2013-01-15 VITALS — BP 120/78 | HR 78 | Resp 14 | Ht 62.0 in | Wt 91.0 lb

## 2013-01-15 DIAGNOSIS — K4090 Unilateral inguinal hernia, without obstruction or gangrene, not specified as recurrent: Secondary | ICD-10-CM

## 2013-01-15 NOTE — Patient Instructions (Addendum)
Patient to return in 1 month for follow up. Advance activity as tolerated after one more week.

## 2013-01-15 NOTE — Progress Notes (Signed)
Patient ID: [name], female   DOB: 1945/09/09, 67 y.o.   MRN: [id number]   The patient presents for a post op right inguinal hernia repair. The procedure was performed on 01/08/13.  The patient is still having occasional pain. She has not had to use any pain medications today for pain.    The incision site right inguinal area looks clean and well healing. Repair appears to be intact. No evidences of hematoma, seroma, or infection.   Patient to return in 1 month for follow up appointment.

## 2013-01-16 ENCOUNTER — Telehealth: Payer: Self-pay | Admitting: *Deleted

## 2013-01-16 NOTE — Telephone Encounter (Signed)
Just an FYI Pt called and just wanted to let you know that from the ride here she is pretty sure that it caused her to get chills and a fever, she also stated that she had leg cramps from 10:30 to 12:30 lastnight.

## 2013-01-20 ENCOUNTER — Encounter: Payer: Self-pay | Admitting: Podiatry

## 2013-01-20 ENCOUNTER — Ambulatory Visit (INDEPENDENT_AMBULATORY_CARE_PROVIDER_SITE_OTHER): Payer: Medicare Other | Admitting: Podiatry

## 2013-01-20 VITALS — BP 100/81 | HR 79 | Resp 16 | Ht 61.0 in | Wt 91.0 lb

## 2013-01-20 DIAGNOSIS — M2040 Other hammer toe(s) (acquired), unspecified foot: Secondary | ICD-10-CM

## 2013-01-20 NOTE — Progress Notes (Signed)
[name] presents today for followup of painful fifth digit of the right foot.  Objective: Pulses remain palpable bilateral lower extremity. Hammertoe deformity fifth right results and reactive hyperkeratosis right. No ulceration.  Assessment: Hammertoe deformity with distal clavus.  Plan: Debridement reactive hyperkeratosis reappoint for 3 weeks.

## 2013-02-02 ENCOUNTER — Other Ambulatory Visit: Payer: Self-pay | Admitting: Internal Medicine

## 2013-02-02 ENCOUNTER — Ambulatory Visit: Payer: Medicare Other | Admitting: Physician Assistant

## 2013-02-02 ENCOUNTER — Telehealth: Payer: Self-pay | Admitting: Physician Assistant

## 2013-02-02 ENCOUNTER — Encounter: Payer: Self-pay | Admitting: Physician Assistant

## 2013-02-02 VITALS — BP 122/70 | HR 96 | Temp 98.8°F | Resp 16 | Wt 92.2 lb

## 2013-02-02 DIAGNOSIS — J209 Acute bronchitis, unspecified: Secondary | ICD-10-CM

## 2013-02-02 DIAGNOSIS — Z1231 Encounter for screening mammogram for malignant neoplasm of breast: Secondary | ICD-10-CM

## 2013-02-02 DIAGNOSIS — J449 Chronic obstructive pulmonary disease, unspecified: Secondary | ICD-10-CM | POA: Diagnosis not present

## 2013-02-02 DIAGNOSIS — N3000 Acute cystitis without hematuria: Secondary | ICD-10-CM

## 2013-02-02 MED ORDER — MIRTAZAPINE 45 MG PO TABS: 45.0000 mg | ORAL_TABLET | Freq: Every day | ORAL | Status: DC

## 2013-02-02 MED ORDER — PROMETHAZINE-CODEINE 6.25-10 MG/5ML PO SYRP: 5.0000 mL | ORAL_SOLUTION | Freq: Four times a day (QID) | ORAL | Status: DC | PRN

## 2013-02-02 MED ORDER — PREDNISONE 10 MG PO TABS: 10.0000 mg | ORAL_TABLET | ORAL | Status: DC

## 2013-02-02 MED ORDER — AZITHROMYCIN 250 MG PO TABS: 250.0000 mg | ORAL_TABLET | Freq: Every day | ORAL | Status: DC

## 2013-02-02 NOTE — Patient Instructions (Signed)
Place upper respiratory infection patient instructions here.  

## 2013-02-02 NOTE — Progress Notes (Addendum)
Subjective:     [name] is a 67 y.o. female who presents for evaluation of symptoms of a URI. Symptoms include achiness, congestion, facial pain, headache described as across right eye, nasal congestion, post nasal drip, productive cough with  clear colored sputum, shortness of breath, sinus pressure and sore throat. Onset of symptoms was 3 days ago, and has been gradually worsening since that time. Treatment to date: antihistamines without relief.  Denies chest pain, nausea, abdominal pain, wheezing.   The following portions of the patient's history were reviewed and updated as appropriate: allergies, current medications, past medical history, past social history and problem list.  Review of Systems Pertinent items are noted in HPI.   Objective:    BP 122/70  Pulse 96  Temp(Src) 98.8 F (37.1 C) (Oral)  Resp 16  Wt 92 lb 3.2 oz (41.822 kg)  SpO2 98%  General Appearance:    Alert, cooperative, no distress, appears stated age  Head:    Normocephalic, without obvious abnormality, atraumatic  Eyes:    PERRL, conjunctiva/corneas clear, EOM's intact, fundi    benign, both eyes  Ears:    Normal TM's and external ear canals, both ears  Nose:   + Sinus tenderness on right side with clear drainage. Nares normal, septum midline, mucosa normal  Throat:   Lips, mucosa, and tongue normal; teeth and gums normal  Neck:   Supple, symmetrical, trachea midline    + cervical adenopathy with tenderness     Lungs:     Clear to auscultation bilaterally, respirations unlabored, decreased breath sounds  Chest Wall:    No tenderness or deformity   Heart:    Regular rate and rhythm, S1 and S2 normal, no murmur, rub   or gallop     Abdomen:     Soft, non-tender, bowel sounds active all four quadrants,    no masses, no organomegaly  Genitalia:    Normal female without lesion, discharge or tenderness  Rectal:    Normal tone, normal prostate, no masses or tenderness;   guaiac negative stool   Extremities:   Extremities normal, atraumatic, no cyanosis or edema  Pulses:   2+ and symmetric all extremities  Skin:   Skin color, texture, turgor normal, no rashes or lesions  Lymph nodes:   Cervical, supraclavicular, and axillary nodes normal  Neurologic:   CNII-XII intact, normal strength, sensation and reflexes    throughout     Assessment:    bronchitis and viral upper respiratory illness   Plan:    Discussed diagnosis and treatment of URI. Suggested symptomatic OTC remedies. Zithromax per orders. Prednisone per orders and Phenergan with codeine cough syrup per orders.  Follow up as needed.   While patient was walking out, she informed my nurse that she has been having burning with urination for 3 days. No CVA tenderness, nausea, vomiting. Will check a U/A and culture.

## 2013-02-02 NOTE — Telephone Encounter (Signed)
Pt needs refill on mirtazatine 45mg  1 daily

## 2013-02-02 NOTE — Addendum Note (Signed)
Addended by: Quentin Mulling R on: 02/02/2013 12:38 PM   Modules accepted: Orders

## 2013-02-03 ENCOUNTER — Telehealth: Payer: Self-pay | Admitting: *Deleted

## 2013-02-03 ENCOUNTER — Ambulatory Visit: Payer: Medicare Other | Admitting: Podiatry

## 2013-02-03 LAB — URINALYSIS, ROUTINE W REFLEX MICROSCOPIC
Bilirubin Urine: NEGATIVE
Glucose, UA: NEGATIVE mg/dL
Hgb urine dipstick: NEGATIVE
Protein, ur: NEGATIVE mg/dL
pH: 6 (ref 5.0–8.0)

## 2013-02-03 LAB — URINE CULTURE: Organism ID, Bacteria: NO GROWTH

## 2013-02-03 NOTE — Telephone Encounter (Signed)
I talked with the patient and she states she lifted some coke cans yesterday around 4pm and now she feels like " a knife stabbing" in her incision on the right groin area. She is voiding and bowels are moving as normal. She went to see her primary doctor yesterday and is being treated for bronchitis ( Zithromax, prednisone and cough syrup). Use ice to the area on and off for the rest of the evening.  Use Percocet as needed and call in the AM with status update per Dr. Lemar Livings.

## 2013-02-03 NOTE — Telephone Encounter (Signed)
Pt called and stated that she is in pain she said he lifted something heavy and now she thinks that she tore something on the inside. Pt sees Dr.Sankar and had surgery a month ago for inguinal hernia. The best number to reach her is [phone]

## 2013-02-04 ENCOUNTER — Telehealth: Payer: Self-pay | Admitting: *Deleted

## 2013-02-04 NOTE — Telephone Encounter (Signed)
Pt is calling says phenergan w/ codine med is causing her to itch. Pt states shes not any better is asking for specific rx that she says works for her, asking for tamiflu rx .  Pharm = [facility]  [place] ph = [phone]

## 2013-02-04 NOTE — Telephone Encounter (Signed)
daughterMarcelino Duster) states she felt like she was better when she talked to her last night but was still in pain. Pt to call back after 1:30.

## 2013-02-04 NOTE — Telephone Encounter (Signed)
appt with Dr Evette Cristal 02-12-13.  The patient feels like she is better than yesterday.  She did use an ice pack and she plans on using it some more today. Appreciates our office staff.

## 2013-02-05 NOTE — Telephone Encounter (Signed)
Patient advised per Marchelle Folks , will not prescribe Tamiflu, advised patient to try OTC Delsym

## 2013-02-08 ENCOUNTER — Encounter: Payer: Self-pay | Admitting: Internal Medicine

## 2013-02-09 ENCOUNTER — Ambulatory Visit: Payer: Medicare Other | Admitting: Emergency Medicine

## 2013-02-09 ENCOUNTER — Encounter: Payer: Self-pay | Admitting: Emergency Medicine

## 2013-02-09 VITALS — BP 138/80 | HR 100 | Temp 98.2°F | Resp 18 | Ht 61.5 in | Wt 91.0 lb

## 2013-02-09 DIAGNOSIS — I10 Essential (primary) hypertension: Secondary | ICD-10-CM | POA: Insufficient documentation

## 2013-02-09 DIAGNOSIS — R05 Cough: Secondary | ICD-10-CM | POA: Diagnosis not present

## 2013-02-09 DIAGNOSIS — R0602 Shortness of breath: Secondary | ICD-10-CM

## 2013-02-09 LAB — BASIC METABOLIC PANEL WITH GFR
CO2: 31 mEq/L (ref 19–32)
Calcium: 10 mg/dL (ref 8.4–10.5)
Creat: 1.04 mg/dL (ref 0.50–1.10)
GFR, Est African American: 63 mL/min
GFR, Est Non African American: 55 mL/min — ABNORMAL LOW
Glucose, Bld: 109 mg/dL — ABNORMAL HIGH (ref 70–99)
Sodium: 135 mEq/L (ref 135–145)

## 2013-02-09 LAB — CBC WITH DIFFERENTIAL/PLATELET
Basophils Absolute: 0 10*3/uL (ref 0.0–0.1)
Eosinophils Relative: 2 % (ref 0–5)
Lymphocytes Relative: 20 % (ref 12–46)
Lymphs Abs: 2.6 10*3/uL (ref 0.7–4.0)
MCV: 90.6 fL (ref 78.0–100.0)
Neutro Abs: 8.5 10*3/uL — ABNORMAL HIGH (ref 1.7–7.7)
Neutrophils Relative %: 65 % (ref 43–77)
Platelets: 404 10*3/uL — ABNORMAL HIGH (ref 150–400)
RBC: 4.58 MIL/uL (ref 3.87–5.11)
RDW: 14.4 % (ref 11.5–15.5)
WBC: 13 10*3/uL — ABNORMAL HIGH (ref 4.0–10.5)

## 2013-02-09 MED ORDER — CEPHALEXIN 250 MG PO CAPS: 250.0000 mg | ORAL_CAPSULE | Freq: Four times a day (QID) | ORAL | Status: AC

## 2013-02-09 MED ORDER — BENZONATATE 100 MG PO CAPS: 100.0000 mg | ORAL_CAPSULE | Freq: Three times a day (TID) | ORAL | Status: DC | PRN

## 2013-02-09 NOTE — Progress Notes (Addendum)
Subjective:    Patient ID: [name], female    DOB: 19-Jun-1945, 67 y.o.   MRN: [id number]  HPI Comments: 67 yo female wants to get re-eval for cough with occasional SOB, chest tightness, sweating  and production with cough. She was treated with Zpak, Pred, cough syrup at last OV but she denies filling ZPAK. She had CXR 2 months ago with some questionable consolidation recommended repeat 4 months. She is concerned because she feels sweaty, but has not been drinking any H2o only Coke. She has a long HX of abnormal labs due to this problem. She also wants to d/c Tramadol for pain currently on 3 tabs a day, she takes for her chronic back pain. She is uncertain if she is taking Neurontin TID. She has multiple intolerances to antibiotics and medications.  Gastrophageal Reflux She complains of coughing. Associated symptoms include fatigue.  Hypertension Associated symptoms include shortness of breath.  Hyperlipidemia Associated symptoms include shortness of breath.    Current Outpatient Prescriptions on File Prior to Visit  Medication Sig Dispense Refill  . albuterol (PROVENTIL HFA;VENTOLIN HFA) 108 (90 BASE) MCG/ACT inhaler Inhale 2 puffs into the lungs every 6 (six) hours as needed for shortness of breath.       Marland Kitchen albuterol (PROVENTIL) (2.5 MG/3ML) 0.083% nebulizer solution Take 2.5 mg by nebulization every 6 (six) hours as needed.      Marland Kitchen aspirin EC 81 MG tablet Take 81 mg by mouth daily.      . Fluticasone-Salmeterol (ADVAIR) 250-50 MCG/DOSE AEPB Inhale 1 puff into the lungs every 12 (twelve) hours as needed. For asthma/copd      . gabapentin (NEURONTIN) 300 MG capsule Take 300 mg by mouth 3 (three) times daily.       . lansoprazole (PREVACID) 30 MG capsule Take 30 mg by mouth daily.       Marland Kitchen LORazepam (ATIVAN) 0.5 MG tablet Take 0.5 mg by mouth every 6 (six) hours as needed for anxiety. For anxiety      . metoCLOPramide (REGLAN) 5 MG tablet Take 1 tablet by mouth daily.      . mirtazapine  (REMERON) 45 MG tablet Take 1 tablet (45 mg total) by mouth at bedtime.  90 tablet  1  . montelukast (SINGULAIR) 10 MG tablet Take 10 mg by mouth daily.       . nitrofurantoin, macrocrystal-monohydrate, (MACROBID) 100 MG capsule Take 100 mg by mouth 2 (two) times daily. Filled on 12/02/12 x7 days      . omeprazole (PRILOSEC) 20 MG capsule Take 1 capsule by mouth daily.      Marland Kitchen oxybutynin (DITROPAN) 5 MG tablet Take 5 mg by mouth daily.       Marland Kitchen oxyCODONE-acetaminophen (PERCOCET/ROXICET) 5-325 MG per tablet Take 0.5 tablets by mouth as needed.      . promethazine-codeine (PHENERGAN WITH CODEINE) 6.25-10 MG/5ML syrup Take 5 mLs by mouth every 6 (six) hours as needed for cough.  240 mL  0  . QUEtiapine (SEROQUEL) 400 MG tablet Take 400 mg by mouth at bedtime.       . quinapril (ACCUPRIL) 40 MG tablet Take 40 mg by mouth daily.      Marland Kitchen tiotropium (SPIRIVA) 18 MCG inhalation capsule Place 18 mcg into inhaler and inhale daily as needed. For copd      . traMADol (ULTRAM) 50 MG tablet Take 50 mg by mouth 3 (three) times daily as needed. For pain       No current facility-administered  medications on file prior to visit.   ALLERGIES Amoxapine and related; Amoxicillin; Ciprocinonide; Ciprofloxacin; Doxycycline; Erythromycin; Sulfa antibiotics; and Zofran  Past Medical History  Diagnosis Date  . Asthma   . Hypertension   . GERD (gastroesophageal reflux disease)   . Stroke     was on Plavix - not now  . Carotid artery aneurysm 03/04/2012  . DJD (degenerative joint disease)   . COPD (chronic obstructive pulmonary disease)     Review of Systems  Constitutional: Positive for fatigue.  Respiratory: Positive for cough, chest tightness and shortness of breath.   Musculoskeletal: Positive for back pain.  All other systems reviewed and are negative.   BP 138/80  Pulse 100  Temp(Src) 98.2 F (36.8 C) (Temporal)  Resp 18  Ht 5' 1.5" (1.562 m)  Wt 91 lb (41.277 kg)  BMI 16.92 kg/m2     Objective:    Physical Exam  Nursing note and vitals reviewed. Constitutional: She appears well-developed.  HENT:  Head: Normocephalic and atraumatic.  Right Ear: External ear normal.  Left Ear: External ear normal.  Nose: Nose normal.  Mouth/Throat: Oropharynx is clear and moist.  Eyes: Pupils are equal, round, and reactive to light.  Neck: Normal range of motion. Neck supple.  Cardiovascular: Normal rate, regular rhythm, normal heart sounds and intact distal pulses.   Pulmonary/Chest: Effort normal and breath sounds normal.  Abdominal: Soft. Bowel sounds are normal. She exhibits no distension. There is no tenderness.  Musculoskeletal: Normal range of motion.  Lymphadenopathy:    She has no cervical adenopathy.  Neurological: She is alert.  Skin: Skin is warm. No rash noted. No erythema.  Clammy,   Psychiatric: She has a normal mood and affect.  Question if judgement slightly impaired          Assessment & Plan:  1. Cough/ SOB/ Chest tightness with recent treatment, check labs, repeat CXR, if SX increase ER. Keflex 250 QID AD given advised to hold pending labs with multiple intolerances and chronic lung condition she needs to try to decrease ABX over use.  2. Chronic back pain- advised should f/u at ortho for pain management options, but can try to decrease Tramadol by 1 pill QD until Stops. She will verify if she is on Neurontin or not.  3. Difficulty to evaluate patient with multiple complaints, advised she needs to improve diet/ hydration/ exercise Over 30 minute OV

## 2013-02-09 NOTE — Patient Instructions (Signed)
Allergic Rhinitis Allergic rhinitis is when the mucous membranes in the nose respond to allergens. Allergens are particles in the air that cause your body to have an allergic reaction. This causes you to release allergic antibodies. Through a chain of events, these eventually cause you to release histamine into the blood stream (hence the use of antihistamines). Although meant to be protective to the body, it is this release that causes your discomfort, such as frequent sneezing, congestion and an itchy runny nose.  CAUSES  The pollen allergens may come from grasses, trees, and weeds. This is seasonal allergic rhinitis, or "hay fever." Other allergens cause year-round allergic rhinitis (perennial allergic rhinitis) such as house dust mite allergen, pet dander and mold spores.  SYMPTOMS   Nasal stuffiness (congestion).  Runny, itchy nose with sneezing and tearing of the eyes.  There is often an itching of the mouth, eyes and ears. It cannot be cured, but it can be controlled with medications. DIAGNOSIS  If you are unable to determine the offending allergen, skin or blood testing may find it. TREATMENT   Avoid the allergen.  Medications and allergy shots (immunotherapy) can help.  Hay fever may often be treated with antihistamines in pill or nasal spray forms. Antihistamines block the effects of histamine. There are over-the-counter medicines that may help with nasal congestion and swelling around the eyes. Check with your caregiver before taking or giving this medicine. If the treatment above does not work, there are many new medications your caregiver can prescribe. Stronger medications may be used if initial measures are ineffective. Desensitizing injections can be used if medications and avoidance fails. Desensitization is when a patient is given ongoing shots until the body becomes less sensitive to the allergen. Make sure you follow up with your caregiver if problems continue. SEEK MEDICAL  CARE IF:   You develop fever (more than 100.5 F (38.1 C).  You develop a cough that does not stop easily (persistent).  You have shortness of breath.  You start wheezing.  Symptoms interfere with normal daily activities. Document Released: 12/05/2000 Document Revised: 06/04/2011 Document Reviewed: 06/16/2008 [employer] Patient Information 2014 [employer].  

## 2013-02-10 ENCOUNTER — Other Ambulatory Visit: Payer: Self-pay | Admitting: Emergency Medicine

## 2013-02-10 ENCOUNTER — Emergency Department (HOSPITAL_COMMUNITY): Payer: Medicare Other

## 2013-02-10 ENCOUNTER — Ambulatory Visit: Payer: Medicare Other | Admitting: Podiatry

## 2013-02-10 ENCOUNTER — Encounter (HOSPITAL_COMMUNITY): Payer: Self-pay | Admitting: Emergency Medicine

## 2013-02-10 ENCOUNTER — Emergency Department (HOSPITAL_COMMUNITY)
Admission: EM | Admit: 2013-02-10 | Discharge: 2013-02-10 | Disposition: A | Discharge status: Home / Self Care | Payer: Medicare Other | Attending: Emergency Medicine | Admitting: Emergency Medicine

## 2013-02-10 DIAGNOSIS — Z880 Allergy status to penicillin: Secondary | ICD-10-CM | POA: Diagnosis not present

## 2013-02-10 DIAGNOSIS — IMO0002 Reserved for concepts with insufficient information to code with codable children: Secondary | ICD-10-CM | POA: Insufficient documentation

## 2013-02-10 DIAGNOSIS — J40 Bronchitis, not specified as acute or chronic: Secondary | ICD-10-CM

## 2013-02-10 DIAGNOSIS — J441 Chronic obstructive pulmonary disease with (acute) exacerbation: Secondary | ICD-10-CM | POA: Insufficient documentation

## 2013-02-10 DIAGNOSIS — J440 Chronic obstructive pulmonary disease with acute lower respiratory infection: Secondary | ICD-10-CM | POA: Diagnosis not present

## 2013-02-10 DIAGNOSIS — K219 Gastro-esophageal reflux disease without esophagitis: Secondary | ICD-10-CM | POA: Insufficient documentation

## 2013-02-10 DIAGNOSIS — J449 Chronic obstructive pulmonary disease, unspecified: Secondary | ICD-10-CM | POA: Diagnosis not present

## 2013-02-10 DIAGNOSIS — Z79899 Other long term (current) drug therapy: Secondary | ICD-10-CM | POA: Insufficient documentation

## 2013-02-10 DIAGNOSIS — I10 Essential (primary) hypertension: Secondary | ICD-10-CM | POA: Insufficient documentation

## 2013-02-10 DIAGNOSIS — Z7982 Long term (current) use of aspirin: Secondary | ICD-10-CM | POA: Diagnosis not present

## 2013-02-10 DIAGNOSIS — F17200 Nicotine dependence, unspecified, uncomplicated: Secondary | ICD-10-CM | POA: Insufficient documentation

## 2013-02-10 DIAGNOSIS — Z8673 Personal history of transient ischemic attack (TIA), and cerebral infarction without residual deficits: Secondary | ICD-10-CM | POA: Diagnosis not present

## 2013-02-10 LAB — COMPREHENSIVE METABOLIC PANEL
ALT: 9 U/L (ref 0–35)
Albumin: 4.1 g/dL (ref 3.5–5.2)
BUN: 6 mg/dL (ref 6–23)
CO2: 26 mEq/L (ref 19–32)
Calcium: 9.3 mg/dL (ref 8.4–10.5)
GFR calc Af Amer: 64 mL/min — ABNORMAL LOW (ref >90)
GFR calc non Af Amer: 55 mL/min — ABNORMAL LOW (ref >90)
Glucose, Bld: 112 mg/dL — ABNORMAL HIGH (ref 70–99)
Potassium: 3.9 mEq/L (ref 3.5–5.1)
Sodium: 132 mEq/L — ABNORMAL LOW (ref 135–145)
Total Protein: 6.9 g/dL (ref 6.0–8.3)

## 2013-02-10 LAB — BRAIN NATRIURETIC PEPTIDE: Brain Natriuretic Peptide: 38.4 pg/mL (ref 0.0–100.0)

## 2013-02-10 LAB — CBC
Hemoglobin: 13.1 g/dL (ref 12.0–15.0)
MCH: 31.4 pg (ref 26.0–34.0)
MCHC: 35.2 g/dL (ref 30.0–36.0)
MCV: 89.2 fL (ref 78.0–100.0)
RDW: 13.6 % (ref 11.5–15.5)
WBC: 11 10*3/uL — ABNORMAL HIGH (ref 4.0–10.5)

## 2013-02-10 LAB — PRO B NATRIURETIC PEPTIDE: Pro B Natriuretic peptide (BNP): 305.9 pg/mL — ABNORMAL HIGH (ref 0–125)

## 2013-02-10 LAB — POCT I-STAT TROPONIN I: Troponin i, poc: 0 ng/mL (ref 0.00–0.08)

## 2013-02-10 MED ORDER — IPRATROPIUM BROMIDE 0.02 % IN SOLN
0.5000 mg | Freq: Once | RESPIRATORY_TRACT | Status: AC
  Administered 2013-02-10: 0.5 mg via RESPIRATORY_TRACT
  Filled 2013-02-10: qty 2.5

## 2013-02-10 MED ORDER — ALBUTEROL SULFATE (5 MG/ML) 0.5% IN NEBU
5.0000 mg | INHALATION_SOLUTION | Freq: Once | RESPIRATORY_TRACT | Status: AC
  Administered 2013-02-10: 5 mg via RESPIRATORY_TRACT
  Filled 2013-02-10: qty 1

## 2013-02-10 MED ORDER — DEXTROSE 5 % IV SOLN
1.0000 g | Freq: Once | INTRAVENOUS | Status: AC
  Administered 2013-02-10: 1 g via INTRAVENOUS
  Filled 2013-02-10: qty 10

## 2013-02-10 MED ORDER — PREDNISONE 20 MG PO TABS: ORAL_TABLET | ORAL | Status: DC

## 2013-02-10 MED ORDER — METHYLPREDNISOLONE SODIUM SUCC 125 MG IJ SOLR
125.0000 mg | Freq: Once | INTRAMUSCULAR | Status: AC
  Administered 2013-02-10: 125 mg via INTRAVENOUS
  Filled 2013-02-10: qty 2

## 2013-02-10 MED ORDER — GABAPENTIN 300 MG PO CAPS: 300.0000 mg | ORAL_CAPSULE | Freq: Three times a day (TID) | ORAL | Status: DC

## 2013-02-10 NOTE — ED Notes (Signed)
Pt c/o lower-mid chest tightness 4/10 post breathing tx. EKG captured and given to Dr. Estell Harpin. Dr. Estell Harpin made aware of pt. Pain.

## 2013-02-10 NOTE — ED Notes (Addendum)
Pt reports shortness of breath and productive cough since Friday. Sent by Dr. Florina Ou office for chest x-ray.

## 2013-02-10 NOTE — ED Notes (Signed)
Pt sts does not have ride home- RN to assist pt to call family members

## 2013-02-10 NOTE — ED Provider Notes (Signed)
CSN: [id number]     Arrival date & time 02/10/13  1017 History   First MD Initiated Contact with Patient 02/10/13 1113     Chief Complaint  Patient presents with  . Shortness of Breath  . Cough   (Consider location/radiation/quality/duration/timing/severity/associated sxs/prior Treatment) Patient is a 67 y.o. female presenting with shortness of breath and cough. The history is provided by the patient (pt complains of a cough and sob).  Shortness of Breath Severity:  Moderate Onset quality:  Gradual Timing:  Intermittent Progression:  Waxing and waning Chronicity:  Recurrent Context: activity   Relieved by:  Inhaler Associated symptoms: cough   Associated symptoms: no abdominal pain, no chest pain, no headaches and no rash   Cough Associated symptoms: shortness of breath   Associated symptoms: no chest pain, no eye discharge, no headaches and no rash     Past Medical History  Diagnosis Date  . Asthma   . Hypertension   . GERD (gastroesophageal reflux disease)   . Stroke     was on Plavix - not now  . Carotid artery aneurysm 03/04/2012  . DJD (degenerative joint disease)   . COPD (chronic obstructive pulmonary disease)    Past Surgical History  Procedure Laterality Date  . Video bronchoscopy  04/24/2012    Procedure: VIDEO BRONCHOSCOPY WITHOUT FLUORO;  Surgeon: Oretha Milch, MD;  Location: [facility] ENDOSCOPY;  Service: Cardiopulmonary;  Laterality: Bilateral;  . Tubal ligation  30 years  . Tonsillectomy    . Colonoscopy  2004  . Hernia repair Right 2014    inguinal   Family History  Problem Relation Age of Onset  . Breast cancer Mother   . Breast cancer Sister    History  Substance Use Topics  . Smoking status: Current Every Day Smoker -- 0.50 packs/day for 30 years  . Smokeless tobacco: Never Used  . Alcohol Use: No   OB History   Grav Para Term Preterm Abortions TAB SAB Ect Mult Living                 Review of Systems  Constitutional: Negative for appetite  change and fatigue.  HENT: Negative for congestion, ear discharge and sinus pressure.   Eyes: Negative for discharge.  Respiratory: Positive for cough and shortness of breath.   Cardiovascular: Negative for chest pain.  Gastrointestinal: Negative for abdominal pain and diarrhea.  Genitourinary: Negative for frequency and hematuria.  Musculoskeletal: Negative for back pain.  Skin: Negative for rash.  Neurological: Negative for seizures and headaches.  Psychiatric/Behavioral: Negative for hallucinations.    Allergies  Amoxapine and related; Amoxicillin; Ciprocinonide; Ciprofloxacin; Doxycycline; Erythromycin; Sulfa antibiotics; and Zofran  Home Medications   Current Outpatient Rx  Name  Route  Sig  Dispense  Refill  . Aclidinium Bromide (TUDORZA PRESSAIR) 400 MCG/ACT AEPB   Inhalation   Inhale 1 application into the lungs.         Marland Kitchen albuterol (PROVENTIL HFA;VENTOLIN HFA) 108 (90 BASE) MCG/ACT inhaler   Inhalation   Inhale 2 puffs into the lungs every 6 (six) hours as needed for shortness of breath.          Marland Kitchen albuterol (PROVENTIL) (2.5 MG/3ML) 0.083% nebulizer solution   Nebulization   Take 2.5 mg by nebulization every 6 (six) hours as needed.         Marland Kitchen aspirin EC 81 MG tablet   Oral   Take 81 mg by mouth every other day.          Marland Kitchen  diltiazem (CARDIZEM CD) 240 MG 24 hr capsule   Oral   Take 240 mg by mouth daily.         . Fluticasone-Salmeterol (ADVAIR) 250-50 MCG/DOSE AEPB   Inhalation   Inhale 1 puff into the lungs every 12 (twelve) hours as needed. For asthma/copd         . gabapentin (NEURONTIN) 300 MG capsule   Oral   Take 300 mg by mouth 3 (three) times daily.          Marland Kitchen LORazepam (ATIVAN) 0.5 MG tablet   Oral   Take 0.5 mg by mouth every 6 (six) hours as needed for anxiety. For anxiety         . Magnesium 250 MG TABS   Oral   Take 250 mg by mouth daily.         . mirtazapine (REMERON) 45 MG tablet   Oral   Take 1 tablet (45 mg total)  by mouth at bedtime.   90 tablet   1   . montelukast (SINGULAIR) 10 MG tablet   Oral   Take 10 mg by mouth daily.          Marland Kitchen omeprazole (PRILOSEC) 40 MG capsule   Oral   Take 40 mg by mouth every other day.         . oxybutynin (DITROPAN) 5 MG tablet   Oral   Take 5 mg by mouth daily.          . QUEtiapine (SEROQUEL) 400 MG tablet   Oral   Take 400 mg by mouth at bedtime.          . quinapril (ACCUPRIL) 40 MG tablet   Oral   Take 20 mg by mouth daily.          Marland Kitchen tiotropium (SPIRIVA) 18 MCG inhalation capsule   Inhalation   Place 18 mcg into inhaler and inhale daily as needed. For copd         . traMADol (ULTRAM) 50 MG tablet   Oral   Take 50 mg by mouth 3 (three) times daily as needed. For pain         . vitamin B-12 (CYANOCOBALAMIN) 1000 MCG tablet   Oral   Take 1,000 mcg by mouth daily.         . benzonatate (TESSALON PERLES) 100 MG capsule   Oral   Take 1 capsule (100 mg total) by mouth 3 (three) times daily as needed for cough.   30 capsule   1   . cephALEXin (KEFLEX) 250 MG capsule   Oral   Take 1 capsule (250 mg total) by mouth 4 (four) times daily.   40 capsule   0   . predniSONE (DELTASONE) 20 MG tablet      1 po bid   10 tablet   0    BP 158/72  Pulse 88  Temp(Src) 98.4 F (36.9 C) (Oral)  Resp 18  Ht 5\' 2"  (1.575 m)  Wt 92 lb (41.731 kg)  BMI 16.82 kg/m2  SpO2 98% Physical Exam  Constitutional: She is oriented to person, place, and time. She appears well-developed.  HENT:  Head: Normocephalic.  Eyes: Conjunctivae and EOM are normal. No scleral icterus.  Neck: Neck supple. No thyromegaly present.  Cardiovascular: Normal rate and regular rhythm.  Exam reveals no gallop and no friction rub.   No murmur heard. Pulmonary/Chest: No stridor. She has no wheezes. She has no rales. She exhibits no tenderness.  Abdominal: She exhibits no distension. There is no tenderness. There is no rebound.  Musculoskeletal: Normal range of  motion. She exhibits no edema.  Lymphadenopathy:    She has no cervical adenopathy.  Neurological: She is oriented to person, place, and time. She exhibits normal muscle tone. Coordination normal.  Skin: No rash noted. No erythema.  Psychiatric: She has a normal mood and affect. Her behavior is normal.    ED Course  Procedures (including critical care time) Labs Review Labs Reviewed  PRO B NATRIURETIC PEPTIDE - Abnormal; Notable for the following:    Pro B Natriuretic peptide (BNP) 305.9 (*)    All other components within normal limits  CBC - Abnormal; Notable for the following:    WBC 11.0 (*)    All other components within normal limits  COMPREHENSIVE METABOLIC PANEL - Abnormal; Notable for the following:    Sodium 132 (*)    Chloride 94 (*)    Glucose, Bld 112 (*)    GFR calc non Af Amer 55 (*)    GFR calc Af Amer 64 (*)    All other components within normal limits  POCT I-STAT TROPONIN I   Imaging Review Dg Chest 2 View  02/10/2013   CLINICAL DATA:  Cough.  EXAM: CHEST  2 VIEW  COMPARISON:  12/07/2012.  FINDINGS: Mediastinum and hilar structures are normal. The lungs are clear. COPD cannot be excluded. No evidence of pneumothorax. Heart size normal with normal pulmonary vascularity. Stable calcific density projected over the left shoulder. The previously questioned parenchymal density of the posterior upper chest seen on lateral view only is less well visualized on today's exam and most likely represents overlapping shadows. Stable mild mid thoracic spine compression fracture. Osteopenia. Degenerative changes thoracic spine.  IMPRESSION: No active cardiopulmonary disease.  COPD.   Electronically Signed   By: Maisie Fus  Register   On: 02/10/2013 11:15    EKG Interpretation   None       MDM   1. COPD (chronic obstructive pulmonary disease)   2. Bronchitis        Benny Lennert, MD 02/10/13 [phone]

## 2013-02-12 ENCOUNTER — Ambulatory Visit: Payer: Medicare Other | Admitting: General Surgery

## 2013-02-16 ENCOUNTER — Ambulatory Visit: Payer: Medicare PPO | Admitting: Emergency Medicine

## 2013-02-16 ENCOUNTER — Ambulatory Visit: Payer: Self-pay | Admitting: Emergency Medicine

## 2013-02-17 ENCOUNTER — Encounter: Payer: Self-pay | Admitting: Podiatry

## 2013-02-17 ENCOUNTER — Ambulatory Visit (INDEPENDENT_AMBULATORY_CARE_PROVIDER_SITE_OTHER): Payer: Medicare Other | Admitting: Podiatry

## 2013-02-17 ENCOUNTER — Ambulatory Visit (HOSPITAL_COMMUNITY)
Admission: RE | Admit: 2013-02-17 | Discharge: 2013-02-17 | Disposition: A | Discharge status: Home / Self Care | Payer: Medicare Other | Source: Ambulatory Visit | Attending: Internal Medicine | Admitting: Internal Medicine

## 2013-02-17 VITALS — BP 117/72 | HR 97 | Resp 16

## 2013-02-17 DIAGNOSIS — M79609 Pain in unspecified limb: Secondary | ICD-10-CM

## 2013-02-17 DIAGNOSIS — B351 Tinea unguium: Secondary | ICD-10-CM

## 2013-02-17 DIAGNOSIS — Z1231 Encounter for screening mammogram for malignant neoplasm of breast: Secondary | ICD-10-CM | POA: Insufficient documentation

## 2013-02-17 NOTE — Progress Notes (Signed)
[name] presents today chief complaint of painful toenails one through 5 bilateral.  Objective: Pulses remain palpable bilateral. Hammertoe deformities are present bilateral. Nails are thick yellow dystrophic onychomycotic and painful palpation as well as debridement.  Assessment: Pain in limb secondary to onychomycosis 1 through 5 bilateral.  Plan: Debridement of nails in thickness and length as a covered service secondary to pain.

## 2013-02-18 ENCOUNTER — Ambulatory Visit: Payer: Medicare Other | Admitting: General Surgery

## 2013-02-24 ENCOUNTER — Encounter: Payer: Self-pay | Admitting: General Surgery

## 2013-02-24 ENCOUNTER — Ambulatory Visit (INDEPENDENT_AMBULATORY_CARE_PROVIDER_SITE_OTHER): Payer: Medicare Other | Admitting: General Surgery

## 2013-02-24 ENCOUNTER — Ambulatory Visit: Payer: Medicare Other | Admitting: General Surgery

## 2013-02-24 VITALS — BP 92/68 | HR 88 | Resp 20 | Ht 62.0 in | Wt 95.0 lb

## 2013-02-24 DIAGNOSIS — K4090 Unilateral inguinal hernia, without obstruction or gangrene, not specified as recurrent: Secondary | ICD-10-CM

## 2013-02-24 NOTE — Patient Instructions (Signed)
Patient to return as needed. No restrictions. She is advised to call with any new questions or concerns.

## 2013-02-24 NOTE — Progress Notes (Signed)
Patient ID: [name], female   DOB: 01-Aug-1945, 67 y.o.   MRN: [id number]  The patient presents for a 1 month post op right inguinal hernia repair. The procedure was performed on 01/08/13. She states she is still having a little bit of abdominal pain but overall is doing well.   Well healed incision site. Hernia repair is intact. No signs of infection, hematoma, or other problems.  Lungs are clear. Abdomen is soft, nontender. Advised f/u as needed.

## 2013-03-03 ENCOUNTER — Encounter: Payer: Self-pay | Admitting: Podiatry

## 2013-03-03 ENCOUNTER — Ambulatory Visit (INDEPENDENT_AMBULATORY_CARE_PROVIDER_SITE_OTHER): Payer: Medicare Other | Admitting: Podiatry

## 2013-03-03 VITALS — BP 113/66 | HR 89 | Resp 16 | Ht 62.0 in | Wt 97.6 lb

## 2013-03-03 DIAGNOSIS — M2040 Other hammer toe(s) (acquired), unspecified foot: Secondary | ICD-10-CM

## 2013-03-04 NOTE — Progress Notes (Signed)
[name] presents today for chief complaint of a painful hammertoe fifth digit right foot with reactive hyperkeratosis.  Objective: Pulses remain palpable right lower extremity I debrided reactive hyperkeratosis medial aspect of second digit right and the plantar lateral aspect of the fifth digit right.  Assessment: Reactive hyperkeratosis.  Plan: Debridement of reactive hyperkeratosis followup with her as needed.

## 2013-03-05 ENCOUNTER — Ambulatory Visit (INDEPENDENT_AMBULATORY_CARE_PROVIDER_SITE_OTHER): Payer: Medicare Other | Admitting: Physician Assistant

## 2013-03-05 ENCOUNTER — Encounter: Payer: Self-pay | Admitting: Physician Assistant

## 2013-03-05 VITALS — BP 110/62 | HR 80 | Temp 98.6°F | Resp 16 | Ht 61.5 in | Wt 99.6 lb

## 2013-03-05 DIAGNOSIS — J441 Chronic obstructive pulmonary disease with (acute) exacerbation: Secondary | ICD-10-CM

## 2013-03-05 DIAGNOSIS — N760 Acute vaginitis: Secondary | ICD-10-CM

## 2013-03-05 MED ORDER — FLUCONAZOLE 150 MG PO TABS: 150.0000 mg | ORAL_TABLET | Freq: Every day | ORAL | Status: DC

## 2013-03-05 NOTE — Patient Instructions (Signed)
VAGINAL DRYNESS OVERVIEW  Vaginal dryness, also known as atrophic vaginitis, is a common condition in postmenopausal women. This condition is also common in women who have had both ovaries removed at the time of hysterectomy.   Some women have uncomfortable symptoms of vaginal dryness, such as pain with sex, burning vaginal discomfort or itching, or abnormal vaginal discharge, while others have no symptoms at all.  VAGINAL DRYNESS CAUSES   Estrogen helps to keep the vagina moist and to maintain thickness of the vaginal lining. Vaginal dryness occurs when the ovaries produce a decreased amount of estrogen. This can occur at certain times in a woman's life, and may be permanent or temporary. Times when less estrogen is made include: ?At the time of menopause. ?After surgical removal of the ovaries, chemotherapy, or radiation therapy of the pelvis for cancer. ?After having a baby, particularly in women who breastfeed. ?While using certain medications, such as danazol, medroxyprogesterone (brand names: Provera or DepoProvera), leuprolide (brand name: Lupron), or nafarelin. When these medications are stopped, estrogen production resumes.  Women who smoke cigarettes have been shown to have an increased risk of an earlier menopause transition as compared to non-smokers. Therefore, atrophic vaginitis symptoms may appear at a younger age in this population.  VAGINAL DRYNESS TREATMENT   There are three treatment options for women with vaginal dryness:  Vaginal lubricants and moisturizers - Vaginal lubricants and moisturizers can be purchased without a prescription. These products do not contain any hormones and have virtually no side effects. - Albolene is found in the facial cleanser section at [facility], [facility], or [employer]. It is a large jar with a blue top. This is the best lubricant for women because it is hypoallergenic. -Natural lubricants, such as olive, avocado or peanut oil, are easily available  products that may be used as a lubricant with sex.  -Vaginal moisturizes (eg, Replens, Moist Again, Vagisil, K-Y Silk-E, and Feminease) are formulated to allow water to be retained in the vaginal tissues. Moisturizers are applied into the vagina three times weekly to allow a continued moisturizing effect. These should not be used just before having sex, as they can be irritating.  Vaginal estrogen - Vaginal estrogen is the most effective treatment option for women with vaginal dryness. Vaginal estrogen must be prescribed by a healthcare provider. Very low doses of vaginal estrogen can be used when it is put into the vagina to treat vaginal dryness. A small amount of estrogen is absorbed into the bloodstream, but only about 100 times less than when using estrogen pills or tablets. As a result, there is a much lower risk of side effects, such as blood clots, breast cancer, and heart attack, compared with other estrogen-containing products (birth control pills, menopausal hormone therapy).   Ospemifene - Ospemifene is a prescription medication that is similar to estrogen, but is not estrogen. In the vaginal tissue, it acts similarly to estrogen. In the breast tissue, it acts as an estrogen blocker. It comes in a pill, and is prescribed for women who want to use an estrogen-like medication for vaginal dryness or painful sex associated with vaginal dryness, but prefer not to use a vaginal medication. The medication may cause hot flashes as a side effect. This type of medication may increase the risk of blood clots or uterine cancer. Further study of ospemifene is needed to evaluate the risk of these complications. This medication has not been tested in women who have had breast cancer or are at a high risk of developing   breast cancer.    Sexual activity - Vaginal estrogen improves vaginal dryness quickly, usually within a few weeks. You may continue to have sex as you treat vaginal dryness because sex itself  can help to keep the vaginal tissues healthy. Vaginal intercourse may help the vaginal tissues by keeping them soft and stretchable and preventing the tissues from shrinking.  If sex continues to be painful despite treatment for vaginal dryness, talk to your healthcare provider.    

## 2013-03-05 NOTE — Progress Notes (Addendum)
   Subjective:    Patient ID: [name], female    DOB: February 21, 1946, 67 y.o.   MRN: [id number]  HPI Patient states for 1-2 weeks she has had stinging when she is wet/peeing posterior vagina and has used diaper cream on it which helped. She just recently completed an ABX for 10 days for bronchitis. Denies vaginal discharge, frequency, urgency.   Review of Systems  Constitutional: Negative.   HENT: Negative.   Respiratory: Positive for wheezing (chronic). Negative for chest tightness and shortness of breath.   Cardiovascular: Negative.   Genitourinary: Positive for dysuria and vaginal pain. Negative for urgency, frequency, hematuria, decreased urine volume, difficulty urinating, genital sores, pelvic pain and dyspareunia.       Objective:   Physical Exam  Constitutional: She is oriented to person, place, and time. She appears well-developed and well-nourished.  HENT:  Head: Normocephalic and atraumatic.  Right Ear: External ear normal.  Left Ear: External ear normal.  Mouth/Throat: Oropharynx is clear and moist.  Eyes: Conjunctivae and EOM are normal. Pupils are equal, round, and reactive to light.  Neck: Normal range of motion. Neck supple. No thyromegaly present.  Cardiovascular: Normal rate, regular rhythm and normal heart sounds.  Exam reveals no gallop and no friction rub.   No murmur heard. Pulmonary/Chest: Effort normal and breath sounds normal. No respiratory distress. She has no wheezes.  Abdominal: Soft. Bowel sounds are normal. She exhibits no distension and no mass. There is no tenderness. There is no rebound and no guarding.  Genitourinary: There is rash on the right labia. There is rash on the left labia. There is erythema around the vagina. No tenderness around the vagina. Vaginal discharge found.  Erythema, white thick discharge, stage 1 cystocele, likely vaginal atrophy.   Musculoskeletal: Normal range of motion.  Lymphadenopathy:    She has no cervical adenopathy.   Neurological: She is alert and oriented to person, place, and time. She displays normal reflexes. No cranial nerve deficit. Coordination normal.  Skin: Skin is warm and dry.  Psychiatric: She has a normal mood and affect.       Assessment & Plan:  Vaginitis and vulvovaginitis, unspecified - Plan: WET PREP BY MOLECULAR PROBE, fluconazole (DIFLUCAN) 150 MG tablet  Discussed possibility of the discomfort being from vaginal atrophy, information given to the patient. If the wet prep comes back negative we will discuss treating the vaginal atrophy.   Negative wet prep- called in estrogen cream for the patient for vaginal atropy.

## 2013-03-06 DIAGNOSIS — M542 Cervicalgia: Secondary | ICD-10-CM | POA: Diagnosis not present

## 2013-03-06 DIAGNOSIS — F17200 Nicotine dependence, unspecified, uncomplicated: Secondary | ICD-10-CM | POA: Diagnosis not present

## 2013-03-06 LAB — WET PREP BY MOLECULAR PROBE
Candida species: NEGATIVE
Trichomonas vaginosis: NEGATIVE

## 2013-03-06 MED ORDER — ESTROGENS, CONJUGATED 0.625 MG/GM VA CREA: TOPICAL_CREAM | VAGINAL | Status: DC

## 2013-03-06 NOTE — Addendum Note (Signed)
Addended by: Quentin Mulling R on: 03/06/2013 12:44 PM   Modules accepted: Orders

## 2013-03-17 ENCOUNTER — Encounter: Payer: Self-pay | Admitting: Podiatry

## 2013-03-17 ENCOUNTER — Ambulatory Visit (INDEPENDENT_AMBULATORY_CARE_PROVIDER_SITE_OTHER): Payer: Medicare Other | Admitting: Podiatry

## 2013-03-17 VITALS — BP 139/70 | HR 76 | Resp 19 | Ht 62.0 in | Wt 100.0 lb

## 2013-03-17 DIAGNOSIS — M79609 Pain in unspecified limb: Secondary | ICD-10-CM

## 2013-03-17 DIAGNOSIS — L84 Corns and callosities: Secondary | ICD-10-CM

## 2013-03-17 DIAGNOSIS — M2040 Other hammer toe(s) (acquired), unspecified foot: Secondary | ICD-10-CM

## 2013-03-17 NOTE — Progress Notes (Signed)
Pt states the areas are not as tender as they usually are but I would like to have been trimmed down if at all possible.  Objective: Vital signs are stable she is alert and oriented x3. She has hammertoe deformities with distal clavi noted. Pulses are palpable bilateral.  Assessment: Hammertoe deformities with reactive hyperkeratosis bilateral.  Plan: Debridement of reactive hyperkeratosis followup with me 3 weeks

## 2013-03-24 ENCOUNTER — Telehealth: Payer: Self-pay | Admitting: *Deleted

## 2013-03-24 MED ORDER — QUINAPRIL HCL 40 MG PO TABS: 40.0000 mg | ORAL_TABLET | Freq: Every day | ORAL | Status: DC

## 2013-03-24 NOTE — Telephone Encounter (Signed)
Patient called and requested refill on quinapril 40 mg and changing pharmacy to [facility] on [address]([phone])

## 2013-04-07 ENCOUNTER — Ambulatory Visit: Payer: Medicare Other | Admitting: Podiatry

## 2013-04-07 DIAGNOSIS — M47812 Spondylosis without myelopathy or radiculopathy, cervical region: Secondary | ICD-10-CM | POA: Diagnosis not present

## 2013-04-09 ENCOUNTER — Encounter: Payer: Self-pay | Admitting: Podiatry

## 2013-04-09 ENCOUNTER — Ambulatory Visit (INDEPENDENT_AMBULATORY_CARE_PROVIDER_SITE_OTHER): Payer: Medicare Other | Admitting: Podiatry

## 2013-04-09 VITALS — BP 116/76 | HR 80 | Resp 20

## 2013-04-09 DIAGNOSIS — M2040 Other hammer toe(s) (acquired), unspecified foot: Secondary | ICD-10-CM

## 2013-04-09 NOTE — Progress Notes (Signed)
He checks my toes and he can trim them if he wants to.  Objective: Vital signs are stable she is alert and oriented x3. Reactive hyperkeratosis to the plantar aspect of the fifth digit of the right foot is painful as well as a sharp incurvated nail margins to the fibular border of the hallux right.  Assessment: Ingrown nail and reactive hyperkeratosis right foot.  Plan: Debridement of all reactive hyperkeratosis and ingrown margin.

## 2013-04-29 ENCOUNTER — Ambulatory Visit (INDEPENDENT_AMBULATORY_CARE_PROVIDER_SITE_OTHER): Payer: Medicare Other | Admitting: Internal Medicine

## 2013-04-29 ENCOUNTER — Encounter: Payer: Self-pay | Admitting: Internal Medicine

## 2013-04-29 VITALS — BP 128/76 | HR 92 | Temp 98.1°F | Resp 20 | Ht 61.5 in | Wt 99.8 lb

## 2013-04-29 DIAGNOSIS — E559 Vitamin D deficiency, unspecified: Secondary | ICD-10-CM | POA: Insufficient documentation

## 2013-04-29 DIAGNOSIS — E782 Mixed hyperlipidemia: Secondary | ICD-10-CM | POA: Diagnosis not present

## 2013-04-29 DIAGNOSIS — I10 Essential (primary) hypertension: Secondary | ICD-10-CM | POA: Diagnosis not present

## 2013-04-29 DIAGNOSIS — R7309 Other abnormal glucose: Secondary | ICD-10-CM

## 2013-04-29 DIAGNOSIS — Z79899 Other long term (current) drug therapy: Secondary | ICD-10-CM | POA: Diagnosis not present

## 2013-04-29 DIAGNOSIS — Z0000 Encounter for general adult medical examination without abnormal findings: Secondary | ICD-10-CM

## 2013-04-29 DIAGNOSIS — Z1212 Encounter for screening for malignant neoplasm of rectum: Secondary | ICD-10-CM

## 2013-04-29 LAB — CBC WITH DIFFERENTIAL/PLATELET
BASOS PCT: 1 % (ref 0–1)
Basophils Absolute: 0.1 10*3/uL (ref 0.0–0.1)
EOS ABS: 0.4 10*3/uL (ref 0.0–0.7)
Eosinophils Relative: 4 % (ref 0–5)
HCT: 37 % (ref 36.0–46.0)
HEMOGLOBIN: 12.6 g/dL (ref 12.0–15.0)
LYMPHS ABS: 2 10*3/uL (ref 0.7–4.0)
Lymphocytes Relative: 21 % (ref 12–46)
MCH: 31.3 pg (ref 26.0–34.0)
MCHC: 34.1 g/dL (ref 30.0–36.0)
MCV: 91.8 fL (ref 78.0–100.0)
MONOS PCT: 13 % — AB (ref 3–12)
Monocytes Absolute: 1.2 10*3/uL — ABNORMAL HIGH (ref 0.1–1.0)
Neutro Abs: 5.8 10*3/uL (ref 1.7–7.7)
Neutrophils Relative %: 61 % (ref 43–77)
PLATELETS: 384 10*3/uL (ref 150–400)
RBC: 4.03 MIL/uL (ref 3.87–5.11)
RDW: 13.9 % (ref 11.5–15.5)
WBC: 9.5 10*3/uL (ref 4.0–10.5)

## 2013-04-29 NOTE — Patient Instructions (Signed)
Chronic Obstructive Pulmonary Disease Chronic obstructive pulmonary disease (COPD) is a common lung problem. In COPD, the flow of air from the lungs is limited. The way your lungs work will probably never return to normal, but there are things you can do to improve you lungs and make yourself feel better. HOME CARE  Take all medicines as told by your doctor.  Only take over-the-counter or prescription medicines as told by your doctor.  Avoid medicines or cough syrups that dry up your airway (such as antihistamines) and do not allow you to get rid of thick spit. You do not need to avoid them if told differently by your doctor.  If you smoke, stop. Smoking makes the problem worse.  Avoid being around things that make your breathing worse (like smoke, chemicals, and fumes).  Use oxygen therapy and therapy to help improve your lungs (pulmonary rehabilitation) if told by your doctor. If you need home oxygen therapy, ask your doctor if you should buy a tool to measure your oxygen level (oximeter).  Avoid people who have a sickness you can catch (contagious).  Avoid going outside when it is very hot, cold, or humid.  Eat healthy foods. Eat smaller meals more often. Rest before meals.  Stay active, but remember to also rest.  Make sure to get all the shots (vaccines) your doctor recommends. Ask your doctor if you need a pneumonia shot.  Learn and use tips on how to relax.  Learn and use tips on how to control your breathing as told by your doctor. Try:  Breathing in (inhaling) through your nose for 1 second. Then, pucker your lips and breath out (exhale) through your lips for 2 seconds.  Putting one hand on your belly (abdomen). Breathe in slowly through your nose for 1 second. Your hand on your belly should move out. Pucker your lips and breathe out slowly through your lips. Your hand on your belly should move in as you breathe out.  Learn and use controlled coughing to clear thick spit  from your lungs. 1. Lean your head a little forward. 2. Breathe in deeply. 3. Try to hold your breath for 3 seconds. 4. Keep your mouth slightly open while coughing 2 times. 5. Spit any thick spit out into a tissue. 6. Rest and do the steps again 1 or 2 times as needed. GET HELP IF:  You cough up more thick spit than usual.  There is a change in the color or thickness of the spit.  It is harder to breathe than usual.  Your breathing is faster than usual. GET HELP RIGHT AWAY IF:   You have shortness of breath while resting.  You have shortness of breath that stops you from:  Being able to talk.  Doing normal activities.  You chest hurts for longer than 5 minutes.  Your skin color is more blue than usual.  Your pulse oximeter shows that you have low oxygen for longer than 5 minutes. MAKE SURE YOU:   Understand these instructions.  Will watch your condition.  Will get help right away if you are not doing well or get worse. Document Released: 08/29/2007 Document Revised: 12/31/2012 Document Reviewed: 11/06/2012 [facility] Patient Information 2014 [place], [place].   Hypertension As your heart beats, it forces blood through your arteries. This force is your blood pressure. If the pressure is too high, it is called hypertension (HTN) or high blood pressure. HTN is dangerous because you may have it and not know it. High  blood pressure may mean that your heart has to work harder to pump blood. Your arteries may be narrow or stiff. The extra work puts you at risk for heart disease, stroke, and other problems.  Blood pressure consists of two numbers, a higher number over a lower, 110/72, for example. It is stated as "110 over 72." The ideal is below 120 for the top number (systolic) and under 80 for the bottom (diastolic). Write down your blood pressure today. You should pay close attention to your blood pressure if you have certain conditions such as:  Heart failure.  Prior heart  attack.  Diabetes  Chronic kidney disease.  Prior stroke.  Multiple risk factors for heart disease. To see if you have HTN, your blood pressure should be measured while you are seated with your arm held at the level of the heart. It should be measured at least twice. A one-time elevated blood pressure reading (especially in the Emergency Department) does not mean that you need treatment. There may be conditions in which the blood pressure is different between your right and left arms. It is important to see your caregiver soon for a recheck. Most people have essential hypertension which means that there is not a specific cause. This type of high blood pressure may be lowered by changing lifestyle factors such as:  Stress.  Smoking.  Lack of exercise.  Excessive weight.  Drug/tobacco/alcohol use.  Eating less salt. Most people do not have symptoms from high blood pressure until it has caused damage to the body. Effective treatment can often prevent, delay or reduce that damage. TREATMENT  When a cause has been identified, treatment for high blood pressure is directed at the cause. There are a large number of medications to treat HTN. These fall into several categories, and your caregiver will help you select the medicines that are best for you. Medications may have side effects. You should review side effects with your caregiver. If your blood pressure stays high after you have made lifestyle changes or started on medicines,   Your medication(s) may need to be changed.  Other problems may need to be addressed.  Be certain you understand your prescriptions, and know how and when to take your medicine.  Be sure to follow up with your caregiver within the time frame advised (usually within two weeks) to have your blood pressure rechecked and to review your medications.  If you are taking more than one medicine to lower your blood pressure, make sure you know how and at what times they  should be taken. Taking two medicines at the same time can result in blood pressure that is too low. SEEK IMMEDIATE MEDICAL CARE IF:  You develop a severe headache, blurred or changing vision, or confusion.  You have unusual weakness or numbness, or a faint feeling.  You have severe chest or abdominal pain, vomiting, or breathing problems. MAKE SURE YOU:   Understand these instructions.  Will watch your condition.  Will get help right away if you are not doing well or get worse.   Diabetes and Exercise Exercising regularly is important. It is not just about losing weight. It has many health benefits, such as:  Improving your overall fitness, flexibility, and endurance.  Increasing your bone density.  Helping with weight control.  Decreasing your body fat.  Increasing your muscle strength.  Reducing stress and tension.  Improving your overall health. People with diabetes who exercise gain additional benefits because exercise:  Reduces appetite.  Improves the body's use of blood sugar (glucose).  Helps lower or control blood glucose.  Decreases blood pressure.  Helps control blood lipids (such as cholesterol and triglycerides).  Improves the body's use of the hormone insulin by:  Increasing the body's insulin sensitivity.  Reducing the body's insulin needs.  Decreases the risk for heart disease because exercising:  Lowers cholesterol and triglycerides levels.  Increases the levels of good cholesterol (such as high-density lipoproteins [HDL]) in the body.  Lowers blood glucose levels. YOUR ACTIVITY PLAN  Choose an activity that you enjoy and set realistic goals. Your health care provider or diabetes educator can help you make an activity plan that works for you. You can break activities into 2 or 3 sessions throughout the day. Doing so is as good as one long session. Exercise ideas include:  Taking the dog for a walk.  Taking the stairs instead of the  elevator.  Dancing to your favorite song.  Doing your favorite exercise with a friend. RECOMMENDATIONS FOR EXERCISING WITH TYPE 1 OR TYPE 2 DIABETES   Check your blood glucose before exercising. If blood glucose levels are greater than 240 mg/dL, check for urine ketones. Do not exercise if ketones are present.  Avoid injecting insulin into areas of the body that are going to be exercised. For example, avoid injecting insulin into:  The arms when playing tennis.  The legs when jogging.  Keep a record of:  Food intake before and after you exercise.  Expected peak times of insulin action.  Blood glucose levels before and after you exercise.  The type and amount of exercise you have done.  Review your records with your health care provider. Your health care provider will help you to develop guidelines for adjusting food intake and insulin amounts before and after exercising.  If you take insulin or oral hypoglycemic agents, watch for signs and symptoms of hypoglycemia. They include:  Dizziness.  Shaking.  Sweating.  Chills.  Confusion.  Drink plenty of water while you exercise to prevent dehydration or heat stroke. Body water is lost during exercise and must be replaced.  Talk to your health care provider before starting an exercise program to make sure it is safe for you. Remember, almost any type of activity is better than none.    Cholesterol Cholesterol is a white, waxy, fat-like protein needed by your body in small amounts. The liver makes all the cholesterol you need. It is carried from the liver by the blood through the blood vessels. Deposits (plaque) may build up on blood vessel walls. This makes the arteries narrower and stiffer. Plaque increases the risk for heart attack and stroke. You cannot feel your cholesterol level even if it is very high. The only way to know is by a blood test to check your lipid (fats) levels. Once you know your cholesterol levels, you  should keep a record of the test results. Work with your caregiver to to keep your levels in the desired range. WHAT THE RESULTS MEAN:  Total cholesterol is a rough measure of all the cholesterol in your blood.  LDL is the so-called bad cholesterol. This is the type that deposits cholesterol in the walls of the arteries. You want this level to be low.  HDL is the good cholesterol because it cleans the arteries and carries the LDL away. You want this level to be high.  Triglycerides are fat that the body can either burn for energy or store. High levels are closely  linked to heart disease. DESIRED LEVELS:  Total cholesterol below 200.  LDL below 100 for people at risk, below 70 for very high risk.  HDL above 50 is good, above 60 is best.  Triglycerides below 150. HOW TO LOWER YOUR CHOLESTEROL:  Diet.  Choose fish or white meat chicken and [place], roasted or baked. Limit fatty cuts of red meat, fried foods, and processed meats, such as sausage and lunch meat.  Eat lots of fresh fruits and vegetables. Choose whole grains, beans, pasta, potatoes and cereals.  Use only small amounts of olive, corn or canola oils. Avoid butter, mayonnaise, shortening or palm kernel oils. Avoid foods with trans-fats.  Use skim/nonfat milk and low-fat/nonfat yogurt and cheeses. Avoid whole milk, cream, ice cream, egg yolks and cheeses. Healthy desserts include angel food cake, ginger snaps, animal crackers, hard candy, popsicles, and low-fat/nonfat frozen yogurt. Avoid pastries, cakes, pies and cookies.  Exercise.  A regular program helps decrease LDL and raises HDL.  Helps with weight control.  Do things that increase your activity level like gardening, walking, or taking the stairs.  Medication.  May be prescribed by your caregiver to help lowering cholesterol and the risk for heart disease.  You may need medicine even if your levels are normal if you have several risk factors. HOME CARE  INSTRUCTIONS   Follow your diet and exercise programs as suggested by your caregiver.  Take medications as directed.  Have blood work done when your caregiver feels it is necessary. MAKE SURE YOU:   Understand these instructions.  Will watch your condition.  Will get help right away if you are not doing well or get worse.      Vitamin D Deficiency Vitamin D is an important vitamin that your body needs. Having too little of it in your body is called a deficiency. A very bad deficiency can make your bones soft and can cause a condition called rickets.  Vitamin D is important to your body for different reasons, such as:   It helps your body absorb 2 minerals called calcium and phosphorus.  It helps make your bones healthy.  It may prevent some diseases, such as diabetes and multiple sclerosis.  It helps your muscles and heart. You can get vitamin D in several ways. It is a natural part of some foods. The vitamin is also added to some dairy products and cereals. Some people take vitamin D supplements. Also, your body makes vitamin D when you are in the sun. It changes the sun's rays into a form of the vitamin that your body can use. CAUSES   Not eating enough foods that contain vitamin D.  Not getting enough sunlight.  Having certain digestive system diseases that make it hard to absorb vitamin D. These diseases include Crohn's disease, chronic pancreatitis, and cystic fibrosis.  Having a surgery in which part of the stomach or small intestine is removed.  Being obese. Fat cells pull vitamin D out of your blood. That means that obese people may not have enough vitamin D left in their blood and in other body tissues.  Having chronic kidney or liver disease. RISK FACTORS Risk factors are things that make you more likely to develop a vitamin D deficiency. They include: 7. Being older. 8. Not being able to get outside very much. 9. Living in a nursing home. 10. Having had  broken bones. 11. Having weak or thin bones (osteoporosis). 12. Having a disease or condition that changes how your body  absorbs vitamin D. 13. Having dark skin. 14. Some medicines such as seizure medicines or steroids. 43. Being overweight or obese. SYMPTOMS Mild cases of vitamin D deficiency may not have any symptoms. If you have a very bad case, symptoms may include:  Bone pain.  Muscle pain.  Falling often.  Broken bones caused by a minor injury, due to osteoporosis. DIAGNOSIS A blood test is the best way to tell if you have a vitamin D deficiency. TREATMENT Vitamin D deficiency can be treated in different ways. Treatment for vitamin D deficiency depends on what is causing it. Options include:  Taking vitamin D supplements.  Taking a calcium supplement. Your caregiver will suggest what dose is best for you. HOME CARE INSTRUCTIONS  Take any supplements that your caregiver prescribes. Follow the directions carefully. Take only the suggested amount.  Have your blood tested 2 months after you start taking supplements.  Eat foods that contain vitamin D. Healthy choices include:  Fortified dairy products, cereals, or juices. Fortified means vitamin D has been added to the food. Check the label on the package to be sure.  Fatty fish like salmon or trout.  Eggs.  Oysters.  Do not use a tanning bed.  Keep your weight at a healthy level. Lose weight if you need to.  Keep all follow-up appointments. Your caregiver will need to perform blood tests to make sure your vitamin D deficiency is going away. SEEK MEDICAL CARE IF:  You have any questions about your treatment.  You continue to have symptoms of vitamin D deficiency.  You have nausea or vomiting.  You are constipated.  You feel confused.  You have severe abdominal or back pain. MAKE SURE YOU:  Understand these instructions.  Will watch your condition.  Will get help right away if you are not doing well or  get worse.

## 2013-04-29 NOTE — Progress Notes (Signed)
Patient ID: [name], female   DOB: 1945-06-20, 68 y.o.   MRN: [id number]  Annual Screening Comprehensive Examination  This very nice 68 y.o. DWF presents for complete physical.  Patient has been followed for HTN, COPD,Chronic Anxiety and Depression,  Prediabetes, Hyperlipidemia, and Vitamin D Deficiency.    HTN predates since  1998. Patient's BP has been controlled at home. Today's BP: 128/76 mmHg.  She has Hx/o 2 strokes w/o sequelae in 2003 and 2004 . In 2004 she had a negative Cardiolyte Scan.Patient denies any cardiac symptoms as chest pain, palpitations, dizziness or ankle swelling.She does have COPD and denies any recent smoking Hx. She has been treated over the years with a variety of inhalers by different providers. Current she reports dyspnea with minimal activities. She has a nonproductive cough.    Patient's hyperlipidemia is controlled with diet. Patient denies myalgias or other medication SE's. Last cholesterol last visit was  116, triglycerides  76, HDL 63 and LDL 38 in July 2014.Marland Kitchen     Patient has prediabetes with last A1c 5.9% in Jan 2013 and more recently 5.3% in July 2014. Patient denies reactive hypoglycemic symptoms, visual blurring, diabetic polys, or paresthesias.    Finally, patient has history of Vitamin D Deficiency with last vitamin D 85 in July 2014.      Medication List         albuterol (2.5 MG/3ML) 0.083% nebulizer solution  Commonly known as:  PROVENTIL  Take 2.5 mg by nebulization every 6 (six) hours as needed.     albuterol 108 (90 BASE) MCG/ACT inhaler  Commonly known as:  PROVENTIL HFA;VENTOLIN HFA  Inhale 2 puffs into the lungs every 6 (six) hours as needed for shortness of breath.     aspirin EC 81 MG tablet  Take 81 mg by mouth every other day.     diltiazem 240 MG 24 hr capsule  Commonly known as:  CARDIZEM CD  Take 240 mg by mouth daily.     Fluticasone-Salmeterol 250-50 MCG/DOSE Aepb  Commonly known as:  ADVAIR  Inhale 1 puff into the  lungs every 12 (twelve) hours as needed. For asthma/copd     gabapentin 300 MG capsule  Commonly known as:  NEURONTIN  Take 1 capsule (300 mg total) by mouth 3 (three) times daily.     LORazepam 0.5 MG tablet  Commonly known as:  ATIVAN  Take 0.5 mg by mouth every 6 (six) hours as needed for anxiety. For anxiety     Magnesium 250 MG Tabs  Take 250 mg by mouth daily.     mirtazapine 45 MG tablet  Commonly known as:  REMERON  Take 1 tablet (45 mg total) by mouth at bedtime.     montelukast 10 MG tablet  Commonly known as:  SINGULAIR  Take 10 mg by mouth daily.     omeprazole 40 MG capsule  Commonly known as:  PRILOSEC  Take 40 mg by mouth every other day.     OVER THE COUNTER MEDICATION  Z-quil at bedtime     oxybutynin 5 MG tablet  Commonly known as:  DITROPAN  Take 5 mg by mouth daily.     predniSONE 20 MG tablet  Commonly known as:  DELTASONE  1 po bid     QUEtiapine 400 MG tablet  Commonly known as:  SEROQUEL  Take 400 mg by mouth at bedtime.     quinapril 40 MG tablet  Commonly known as:  ACCUPRIL  Take 1  tablet (40 mg total) by mouth at bedtime.     tiotropium 18 MCG inhalation capsule  Commonly known as:  SPIRIVA  Place 18 mcg into inhaler and inhale daily as needed. For copd     traMADol 50 MG tablet  Commonly known as:  ULTRAM  Take 50 mg by mouth 3 (three) times daily as needed. For pain     TUDORZA PRESSAIR 400 MCG/ACT Aepb  Generic drug:  Aclidinium Bromide  Inhale 1 application into the lungs.     vitamin B-12 1000 MCG tablet  Commonly known as:  CYANOCOBALAMIN  Take 1,000 mcg by mouth daily.        Allergies  Allergen Reactions  . Amoxapine And Related Nausea And Vomiting  . Amoxicillin   . Ciprocinonide [Fluocinolone]   . Ciprofloxacin   . Doxycycline Nausea And Vomiting  . Erythromycin Hives  . Sulfa Antibiotics Other (See Comments)    Unknown-reaction as a child  . Zofran [Ondansetron Hcl] Nausea And Vomiting    Past Medical  History  Diagnosis Date  . Asthma   . Hypertension   . GERD (gastroesophageal reflux disease)   . Stroke     was on Plavix - not now  . Carotid artery aneurysm 03/04/2012  . DJD (degenerative joint disease)   . COPD (chronic obstructive pulmonary disease)     Past Surgical History  Procedure Laterality Date  . Video bronchoscopy  04/24/2012    Procedure: VIDEO BRONCHOSCOPY WITHOUT FLUORO;  Surgeon: Rigoberto Noel, MD;  Location: [facility];  Service: Cardiopulmonary;  Laterality: Bilateral;  . Tubal ligation  30 years  . Tonsillectomy    . Colonoscopy  2004  . Hernia repair Right 2014    inguinal    Family History  Problem Relation Age of Onset  . Breast cancer Mother   . Breast cancer Sister     History  Substance Use Topics  . Smoking status: Current Every Day Smoker -- 0.50 packs/day for 30 years  . Smokeless tobacco: Never Used  . Alcohol Use: No    ROS Constitutional: Denies fever, chills, weight loss/gain, headaches, insomnia, fatigue, night sweats, and change in appetite. Eyes: Denies redness, blurred vision, diplopia, discharge, itchy, watery eyes.  ENT: Denies discharge, congestion, post nasal drip, epistaxis, sore throat, earache, hearing loss, dental pain, Tinnitus, Vertigo, Sinus pain, snoring.  Cardio: Denies chest pain, palpitations, irregular heartbeat, syncope, dyspnea, diaphoresis, orthopnea, PND, claudication, edema Respiratory: denies cough, dyspnea, DOE, pleurisy, hoarseness, laryngitis, wheezing.  Gastrointestinal: Denies dysphagia, heartburn, reflux, water brash, pain, cramps, nausea, vomiting, bloating, diarrhea, constipation, hematemesis, melena, hematochezia, jaundice, hemorrhoids Genitourinary: Denies dysuria, frequency, urgency, nocturia, hesitancy, discharge, hematuria, flank pain Breast:Breast lumps, nipple discharge, bleeding.  Musculoskeletal: Denies arthralgia, myalgia, stiffness, Jt. Swelling, pain, limp, and strain/sprain. Skin: Denies  puritis, rash, hives, warts, acne, eczema, changing in skin lesion Neuro: No weakness, tremor, incoordination, spasms, paresthesia, pain Psychiatric: Denies confusion, memory loss, sensory loss Endocrine: Denies change in weight, skin, hair change, nocturia, and paresthesia, diabetic polys, visual blurring, hyper / hypo glycemic episodes.  Heme/Lymph: No excessive bleeding, bruising, enlarged lymph nodes.    Estimated body mass index is 18.55 kg/(m^2) as calculated from the following:   Height as of this encounter: 5' 1.5" (1.562 m).   Weight as of this encounter: 99 lb 12.8 oz (45.269 kg).  Physical Exam General Appearance: Well nourished, in no apparent distress. Speech is hoarse.  Eyes: PERRLA, EOMs, conjunctiva no swelling or erythema, normal fundi and vessels. Sinuses:  No frontal/maxillary tenderness ENT/Mouth: EACs patent / TMs  nl. Nares clear without erythema, swelling, mucoid exudates. Oral hygiene is good. No erythema, swelling, or exudate. Tongue normal, non-obstructing. Tonsils not swollen or erythematous. Hearing normal.  Neck: Supple, thyroid normal. No bruits, nodes or JVD. Respiratory: Respiratory effort normal.  BS are very distant w/o rales, rhonci, wheezing or stridor. Cardio: Heart sounds are normal with regular rate and rhythm and no murmurs, rubs or gallops. Peripheral pulses are normal and equal bilaterally without edema. No aortic or femoral bruits. Chest: symmetric with normal excursions and percussion. Breasts: Symmetric, without lumps, nipple discharge, retractions, or fibrocystic changes.  Abdomen: Flat, soft, with bowl sounds. Nontender, no guarding, rebound, hernias, masses, or organomegaly.  Lymphatics: Non tender without lymphadenopathy.  Genitourinary:  Musculoskeletal: Full ROM all peripheral extremities, joint stability, 5/5 strength, and normal gait. Skin: Warm and dry without rashes, lesions, cyanosis, clubbing or  ecchymosis.  Neuro: Cranial nerves  intact, reflexes equal bilaterally. Normal muscle tone, no cerebellar symptoms. Sensation intact.  Pysch: Awake and oriented X 3, normal affect, Insight and Judgment appropriate.   Assessment and Plan  1. Annual Screening Examination 2. Hypertension  3. Hyperlipidemia 4. Pre Diabetes 5. Vitamin D Deficiency 6. COPD 7. Chronic Anxiety and Depression  Continue prudent diet as discussed, weight control, BP monitoring, regular exercise, and medications. Discussed med's effects and SE's. Screening labs and tests as requested with regular follow-up as recommended.

## 2013-04-30 ENCOUNTER — Other Ambulatory Visit: Payer: Self-pay

## 2013-04-30 DIAGNOSIS — J441 Chronic obstructive pulmonary disease with (acute) exacerbation: Secondary | ICD-10-CM

## 2013-04-30 DIAGNOSIS — R0602 Shortness of breath: Secondary | ICD-10-CM

## 2013-04-30 LAB — HEPATIC FUNCTION PANEL
ALK PHOS: 70 U/L (ref 39–117)
ALT: 8 U/L (ref 0–35)
AST: 18 U/L (ref 0–37)
Albumin: 4.3 g/dL (ref 3.5–5.2)
BILIRUBIN DIRECT: 0.1 mg/dL (ref 0.0–0.3)
BILIRUBIN INDIRECT: 0.1 mg/dL — AB (ref 0.2–1.2)
BILIRUBIN TOTAL: 0.2 mg/dL (ref 0.2–1.2)
Total Protein: 6.7 g/dL (ref 6.0–8.3)

## 2013-04-30 LAB — BASIC METABOLIC PANEL WITH GFR
BUN: 10 mg/dL (ref 6–23)
CALCIUM: 9.8 mg/dL (ref 8.4–10.5)
CO2: 28 meq/L (ref 19–32)
Chloride: 99 mEq/L (ref 96–112)
Creat: 0.85 mg/dL (ref 0.50–1.10)
GFR, EST AFRICAN AMERICAN: 81 mL/min
GFR, Est Non African American: 70 mL/min
Glucose, Bld: 83 mg/dL (ref 70–99)
Potassium: 5.2 mEq/L (ref 3.5–5.3)
Sodium: 134 mEq/L — ABNORMAL LOW (ref 135–145)

## 2013-04-30 LAB — LIPID PANEL
CHOLESTEROL: 147 mg/dL (ref 0–200)
HDL: 83 mg/dL (ref >39)
LDL Cholesterol: 45 mg/dL (ref 0–99)
TRIGLYCERIDES: 97 mg/dL (ref <150)
Total CHOL/HDL Ratio: 1.8 Ratio
VLDL: 19 mg/dL (ref 0–40)

## 2013-04-30 LAB — TSH: TSH: 1.947 u[IU]/mL (ref 0.350–4.500)

## 2013-04-30 LAB — URINALYSIS, MICROSCOPIC ONLY
Bacteria, UA: NONE SEEN
CASTS: NONE SEEN
Crystals: NONE SEEN
SQUAMOUS EPITHELIAL / LPF: NONE SEEN

## 2013-04-30 LAB — HEMOGLOBIN A1C
HEMOGLOBIN A1C: 5.8 % — AB (ref <5.7)
MEAN PLASMA GLUCOSE: 120 mg/dL — AB (ref <117)

## 2013-04-30 LAB — MICROALBUMIN / CREATININE URINE RATIO
Creatinine, Urine: 56.8 mg/dL
Microalb Creat Ratio: 8.8 mg/g (ref 0.0–30.0)
Microalb, Ur: 0.5 mg/dL (ref 0.00–1.89)

## 2013-04-30 LAB — VITAMIN D 25 HYDROXY (VIT D DEFICIENCY, FRACTURES): VIT D 25 HYDROXY: 44 ng/mL (ref 30–89)

## 2013-04-30 LAB — INSULIN, FASTING: INSULIN FASTING, SERUM: 7 u[IU]/mL (ref 3–28)

## 2013-04-30 LAB — MAGNESIUM: Magnesium: 1.8 mg/dL (ref 1.5–2.5)

## 2013-05-05 ENCOUNTER — Other Ambulatory Visit: Payer: Self-pay | Admitting: Internal Medicine

## 2013-05-05 ENCOUNTER — Ambulatory Visit: Payer: Medicare Other | Admitting: Podiatry

## 2013-05-06 ENCOUNTER — Other Ambulatory Visit: Payer: Self-pay | Admitting: Internal Medicine

## 2013-05-06 DIAGNOSIS — F418 Other specified anxiety disorders: Secondary | ICD-10-CM

## 2013-05-06 DIAGNOSIS — J449 Chronic obstructive pulmonary disease, unspecified: Secondary | ICD-10-CM

## 2013-05-06 MED ORDER — MIRTAZAPINE 45 MG PO TABS: 45.0000 mg | ORAL_TABLET | Freq: Every day | ORAL | Status: DC

## 2013-05-06 MED ORDER — ACLIDINIUM BROMIDE 400 MCG/ACT IN AEPB: 1.0000 "application " | INHALATION_SPRAY | Freq: Two times a day (BID) | RESPIRATORY_TRACT | Status: DC

## 2013-05-08 ENCOUNTER — Encounter: Payer: Self-pay | Admitting: Internal Medicine

## 2013-05-08 ENCOUNTER — Ambulatory Visit (INDEPENDENT_AMBULATORY_CARE_PROVIDER_SITE_OTHER): Payer: Medicare Other | Admitting: Internal Medicine

## 2013-05-08 ENCOUNTER — Institutional Professional Consult (permissible substitution): Payer: Medicare Other | Admitting: Emergency Medicine

## 2013-05-08 ENCOUNTER — Ambulatory Visit (INDEPENDENT_AMBULATORY_CARE_PROVIDER_SITE_OTHER)
Admission: RE | Admit: 2013-05-08 | Discharge: 2013-05-08 | Disposition: A | Discharge status: Home / Self Care | Payer: Medicare Other | Source: Ambulatory Visit | Attending: Internal Medicine | Admitting: Internal Medicine

## 2013-05-08 VITALS — BP 112/70 | HR 86 | Temp 97.9°F | Ht 62.0 in | Wt 100.0 lb

## 2013-05-08 DIAGNOSIS — J383 Other diseases of vocal cords: Secondary | ICD-10-CM | POA: Diagnosis not present

## 2013-05-08 DIAGNOSIS — J449 Chronic obstructive pulmonary disease, unspecified: Secondary | ICD-10-CM | POA: Diagnosis not present

## 2013-05-08 DIAGNOSIS — F17200 Nicotine dependence, unspecified, uncomplicated: Secondary | ICD-10-CM

## 2013-05-08 DIAGNOSIS — I10 Essential (primary) hypertension: Secondary | ICD-10-CM

## 2013-05-08 MED ORDER — VALSARTAN 80 MG PO TABS: 80.0000 mg | ORAL_TABLET | Freq: Every day | ORAL | Status: DC

## 2013-05-08 MED ORDER — PREDNISONE 10 MG PO TABS: ORAL_TABLET | ORAL | Status: DC

## 2013-05-08 NOTE — Progress Notes (Signed)
Subjective:     Patient ID: [name], female   DOB: Oct 09, 1945   MRN: [id number]  HPI   46 yowf active smoker with hoarseness all her life with fob 04/24/12 with VC dysfunction and GOLD III COPD documented 03/28/12 referred by Dr Lawanna Kobus 05/09/2013 to pulmonary clinic for new pattern sob.    05/08/2013 consultation  Melvyn Novas / maint on ACEi, Advair/ tudorza:  Chief Complaint  Patient presents with  . acute office visit    [name]. Pt c/o inc SOB x 1 mo. C/o occ cough, chest tx, pain in chest and RU back and wheezing.   no purulent sputum, mod dysphagia but no sore throat or odynophagia. Onset was insidious, pattern progressive to point where doe across the room.  No obvious day to day or daytime variabilty or assoc chest tightness, subjective wheeze overt sinus or hb symptoms. No unusual exp hx or h/o childhood pna/ asthma or knowledge of premature birth.  Sleeping ok without nocturnal  or early am exacerbation  of respiratory  c/o's or need for noct saba. Also denies any obvious fluctuation of symptoms with weather or environmental changes or other aggravating or alleviating factors except as outlined above   Current Medications, Allergies, Complete Past Medical History, Past Surgical History, Family History, and Social History were reviewed in [employer] record.  ROS  The following are not active complaints unless bolded sore throat, dysphagia, dental problems, itching, sneezing,  nasal congestion or excess/ purulent secretions, ear ache,   fever, chills, sweats, unintended wt loss, pleuritic or exertional cp, hemoptysis,  orthopnea pnd or leg swelling, presyncope, palpitations, heartburn, abdominal pain, anorexia, nausea, vomiting, diarrhea  or change in bowel or urinary habits, change in stools or urine, dysuria,hematuria,  rash, arthralgias, visual complaints, headache, numbness weakness or ataxia or problems with walking or coordination,  change in mood/affect or  memory.         Review of Systems     Objective:   Physical Exam    hoarse amb wf nad very prominent pseudowheeze  Wt Readings from Last 3 Encounters:  05/08/13 100 lb (45.36 kg)  04/29/13 99 lb 12.8 oz (45.269 kg)  03/17/13 100 lb (45.36 kg)     HEENT mild turbinate edema.  Oropharynx no thrush or excess pnd or cobblestoning.  No JVD or cervical adenopathy. Mild accessory muscle hypertrophy. Trachea midline, nl thryroid. Chest was hyperinflated by percussion with diminished breath sounds and moderate increased exp time without wheeze. Hoover sign positive at mid inspiration. Regular rate and rhythm without murmur gallop or rub or increase P2 or edema.  Abd: no hsm, nl excursion. Ext warm without cyanosis or clubbing.    CXR  05/08/2013 : COPD without evidence of acute cardiopulmonary disease.     Assessment:

## 2013-05-08 NOTE — Patient Instructions (Addendum)
Stop quinapril, advair, tudorza Start diovan (valsartan) 160 mg one daily   Change omeprazole to 40 mg Take 30- 60 min before your first and last meals of the day   Prednisone 10 mg take  4 each am x 2 days,  2 each am x 2 days,  1 each am x 2 days and stop   Continue the nebulizer up to every 4 hours if needed   Please remember to go to the  x-ray department downstairs for your tests - we will call you with the results when they are available.     See Tammy NP w/in 2 weeks with all your medications, even over the counter meds, separated in two separate bags, the ones you take no matter what vs the ones you stop once you feel better and take only as needed when you feel you need them.   Tammy  will generate for you a new user friendly medication calendar that will put [place] all on the same page re: your medication use.     Without this process, it simply isn't possible to assure that we are providing  your outpatient care  with  the attention to detail we feel you deserve.   If we cannot assure that you're getting that kind of care,  then we cannot manage your problem effectively from this clinic.  Once you have seen Tammy and we are sure that we're all on the same page with your medication use she will arrange follow up with me.

## 2013-05-09 NOTE — Assessment & Plan Note (Signed)

## 2013-05-09 NOTE — Assessment & Plan Note (Signed)

## 2013-05-09 NOTE — Assessment & Plan Note (Addendum)
Spirometry 03/28/2012 fev1 0.8 -39% with ratio 59   DDX of  difficult airways managment all start with A and  include Adherence, Ace Inhibitors, Acid Reflux, Active Sinus Disease, Alpha 1 Antitripsin deficiency, Anxiety masquerading as Airways dz,  ABPA,  allergy(esp in young), Aspiration (esp in elderly), Adverse effects of DPI,  Active smokers, plus two Bs  = Bronchiectasis and Beta blocker use..and one C= CHF  ACEi and Advair at the top of the oist of usual suspects > needs trial off both and just use the neb for now  Active smoking another big concern > discussed separately  ? Acid (or non-acid) GERD > always difficult to exclude as up to 75% of pts in some series report no assoc GI/ Heartburn symptoms> rec max (24h)  acid suppression and diet restrictions/ reviewed and instructions given in writing.   Needs med reconciliation before further steps > To keep things simple, I have asked the patient to first separate medicines that are perceived as maintenance, that is to be taken daily "no matter what", from those medicines that are taken on only on an as-needed basis and I have given the patient examples of both, and then return to see our NP to generate a  detailed  medication calendar which should be followed until the next physician sees the patient and updates it.

## 2013-05-09 NOTE — Assessment & Plan Note (Signed)
See FOB 04/24/12   ? Needs ent eval p sort out the effects of acei and advair on her symptoms

## 2013-05-10 ENCOUNTER — Other Ambulatory Visit: Payer: Self-pay | Admitting: Emergency Medicine

## 2013-05-11 NOTE — Progress Notes (Signed)
Quick Note:  LMTCB ______ 

## 2013-05-12 ENCOUNTER — Ambulatory Visit: Payer: Medicare Other | Admitting: Podiatry

## 2013-05-14 ENCOUNTER — Other Ambulatory Visit: Payer: Self-pay | Admitting: *Deleted

## 2013-05-14 ENCOUNTER — Telehealth: Payer: Self-pay | Admitting: Internal Medicine

## 2013-05-14 MED ORDER — OMEPRAZOLE 40 MG PO CPDR: 40.0000 mg | DELAYED_RELEASE_CAPSULE | Freq: Every day | ORAL | Status: DC

## 2013-05-14 NOTE — Telephone Encounter (Signed)
Advised pt cxr result per MW.  Pt verbalized understanding

## 2013-05-18 ENCOUNTER — Other Ambulatory Visit: Payer: Self-pay | Admitting: Emergency Medicine

## 2013-05-19 ENCOUNTER — Ambulatory Visit: Payer: Medicare Other | Admitting: Pulmonary Disease

## 2013-05-19 ENCOUNTER — Ambulatory Visit: Payer: Medicare Other | Admitting: Podiatry

## 2013-05-20 ENCOUNTER — Other Ambulatory Visit: Payer: Self-pay | Admitting: *Deleted

## 2013-05-20 ENCOUNTER — Other Ambulatory Visit: Payer: Self-pay | Admitting: Internal Medicine

## 2013-05-20 DIAGNOSIS — L570 Actinic keratosis: Secondary | ICD-10-CM | POA: Diagnosis not present

## 2013-05-20 DIAGNOSIS — L988 Other specified disorders of the skin and subcutaneous tissue: Secondary | ICD-10-CM | POA: Diagnosis not present

## 2013-05-20 DIAGNOSIS — D485 Neoplasm of uncertain behavior of skin: Secondary | ICD-10-CM | POA: Diagnosis not present

## 2013-05-20 DIAGNOSIS — Z85828 Personal history of other malignant neoplasm of skin: Secondary | ICD-10-CM | POA: Diagnosis not present

## 2013-05-20 DIAGNOSIS — D239 Other benign neoplasm of skin, unspecified: Secondary | ICD-10-CM | POA: Diagnosis not present

## 2013-05-20 MED ORDER — FLUTICASONE-SALMETEROL 250-50 MCG/DOSE IN AEPB: 1.0000 | INHALATION_SPRAY | Freq: Two times a day (BID) | RESPIRATORY_TRACT | Status: DC

## 2013-05-20 MED ORDER — ACLIDINIUM BROMIDE 400 MCG/ACT IN AEPB: 400.0000 ug | INHALATION_SPRAY | Freq: Two times a day (BID) | RESPIRATORY_TRACT | Status: DC

## 2013-05-21 ENCOUNTER — Encounter: Payer: Medicare Other | Admitting: Adult Health

## 2013-05-25 ENCOUNTER — Ambulatory Visit (INDEPENDENT_AMBULATORY_CARE_PROVIDER_SITE_OTHER): Payer: Medicare Other | Admitting: Adult Health

## 2013-05-25 ENCOUNTER — Encounter: Payer: Self-pay | Admitting: Adult Health

## 2013-05-25 VITALS — BP 112/66 | HR 80 | Temp 98.8°F | Ht 62.0 in | Wt 104.9 lb

## 2013-05-25 DIAGNOSIS — R490 Dysphonia: Secondary | ICD-10-CM

## 2013-05-25 DIAGNOSIS — J381 Polyp of vocal cord and larynx: Secondary | ICD-10-CM | POA: Diagnosis not present

## 2013-05-25 DIAGNOSIS — J449 Chronic obstructive pulmonary disease, unspecified: Secondary | ICD-10-CM

## 2013-05-25 MED ORDER — PREDNISONE 10 MG PO TABS: ORAL_TABLET | ORAL | Status: DC

## 2013-05-25 NOTE — Patient Instructions (Addendum)
Prednisone taper over next week.  Mucinex DM Twice daily  As needed  Cough, congestion  Follow Medication calendar closely and bring to each visit.  Make sure you rinse after Advair and Tudorza.  We are setting you up for a ENT evaluation  Follow up Dr. Elsworth Soho  In 2-3 weeks and As needed   MUST STOP SMOKING COMPLETELY Please contact office for sooner follow up if symptoms do not improve or worsen or seek emergency care

## 2013-05-25 NOTE — Progress Notes (Signed)
Subjective:     Patient ID: [name], female   DOB: 10/02/1945   MRN: [id number]  HPI   26 yowf active smoker with hoarseness all her life with fob 04/24/12 with VC dysfunction and GOLD III COPD documented 03/28/12 referred by Dr [name] 05/09/2013 to pulmonary clinic for new pattern sob.    05/08/2013 consultation  [name] / maint on ACEi, Advair/ tudorza:  Chief Complaint  Patient presents with  . acute office visit    [name] pt. Pt c/o inc SOB x 1 mo. C/o occ cough, chest tx, pain in chest and RU back and wheezing.   no purulent sputum, mod dysphagia but no sore throat or odynophagia. Onset was insidious, pattern progressive to point where doe across the room.   05/25/2013 Follow up and Med review  Patient returns for a two-week followup and medication review. Reviewed all her medications and organized them into a medication calendar with patient education. Patient is very unclear of her medications that she is taking. She depends on samples for her inhalers. She says that she will not be on take any inhalers because they cost too much money. She has to depend on samples. We talked about patient assistance program, however, she says, that she doesn't qualify. We also discussed changing over to nebulizers. However, she says she does not want to do this.  Patient was seen last visit for worsening shortness, of breath and cough. Patient was taken off of her ACE inhibitor. She was also taken off of her Advair and [place].   Pt was seen last year with FOB and ? Vocal cord polyps.  She was referred to ENT. He does not appear that she went for this referral. She continues to have chronic daily Hoarseness. Patient says that she was unable to remain off. Her inhalers and has restarted on Advair and Tudorza.  Says that she does not feel any better and that she now has more wheezing and SOB.  She denies any hemoptysis, orthopnea, PND, leg swelling, or fever. Chest x-ray last visit showed no acute  process appear  Current Medications, Allergies, Complete Past Medical History, Past Surgical History, Family History, and Social History were reviewed in [employer] record.  ROS  The following are not active complaints unless bolded sore throat, dysphagia, dental problems, itching, sneezing,  nasal congestion or excess/ purulent secretions, ear ache,   fever, chills, sweats, unintended wt loss, pleuritic or exertional cp, hemoptysis,  orthopnea pnd or leg swelling, presyncope, palpitations, heartburn, abdominal pain, anorexia, nausea, vomiting, diarrhea  or change in bowel or urinary habits, change in stools or urine, dysuria,hematuria,  rash, arthralgias, visual complaints, headache, numbness weakness or ataxia or problems with walking or coordination,  change in mood/affect or memory.         Review of Systems .Constitutional:   No  weight loss, night sweats,  Fevers, chills, fatigue, or  lassitude.  HEENT:   No headaches,  Difficulty swallowing,  Tooth/dental problems, or  Sore throat, ++hoarseness                No sneezing, itching, ear ache, nasal congestion, post nasal drip,   CV:  No chest pain,  Orthopnea, PND, swelling in lower extremities, anasarca, dizziness, palpitations, syncope.   GI  No heartburn, indigestion, abdominal pain, nausea, vomiting, diarrhea, change in bowel habits, loss of appetite, bloody stools.   Resp:    No chest wall deformity  Skin: no rash or lesions.  GU:  no dysuria, change in color of urine, no urgency or frequency.  No flank pain, no hematuria   MS:  No joint pain or swelling.  No decreased range of motion.  No back pain.  Psych:  No change in mood or affect. No depression or anxiety.  No memory loss.          Objective:   Physical Exam  Very hoarse amb wf nad   HEENT mild turbinate edema.  Oropharynx no thrush or excess pnd or cobblestoning.  No JVD or cervical adenopathy. Mild accessory muscle hypertrophy. Trachea  midline, nl thryroid. Chest was hyperinflated by percussion with diminished breath sounds and moderate increased exp time without wheeze.   Regular rate and rhythm without murmur gallop or rub or increase P2 or edema.  Abd: no hsm, nl excursion. Ext warm without cyanosis or clubbing.    CXR  05/08/2013 : COPD without evidence of acute cardiopulmonary disease.     Assessment:

## 2013-05-26 ENCOUNTER — Ambulatory Visit: Payer: Medicare Other | Admitting: Internal Medicine

## 2013-05-26 ENCOUNTER — Encounter: Payer: Medicare Other | Admitting: Adult Health

## 2013-05-26 ENCOUNTER — Telehealth: Payer: Self-pay | Admitting: Adult Health

## 2013-05-26 NOTE — Assessment & Plan Note (Signed)
Chronic Hoarseness  FOB 2014 showed vocal cord bowing & complete occlusion of the aperture during expiration . Suspected vocal cords polyps.  She did not go for her referral from last year to ENT  Plan  Refer to ENT for evaluation  Pt aware of importance of this referral

## 2013-05-26 NOTE — Assessment & Plan Note (Signed)
Mild flare  Patient's medications were reviewed today and patient education was given. Computerized medication calendar was adjusted/completed   Plan  Prednisone taper over next week.  Mucinex DM Twice daily  As needed  Cough, congestion  Follow Medication calendar closely and bring to each visit.  Make sure you rinse after Advair and Tudorza.  We are setting you up for a ENT evaluation  Follow up Dr. Elsworth Soho  In 2-3 weeks and As needed   MUST STOP SMOKING COMPLETELY Please contact office for sooner follow up if symptoms do not improve or worsen or seek emergency care

## 2013-05-26 NOTE — Telephone Encounter (Signed)
Pt states on medication calander tramadol and gabepentin are not listed. She wants to know why they are not listed

## 2013-05-27 NOTE — Telephone Encounter (Signed)
Jess please call her  She did not bring these to visit Figure out when she takes and we can update her med list/calendar and send her a new one.

## 2013-05-27 NOTE — Telephone Encounter (Signed)
Called spoke with patient, she is taking:  Gabapentin 300mg  1 capsule by mouth three times daily >> taken morning, afternoon and bedtime Tramadol 50mg  1 tablet by mouth three times daily as needed for pain >> taken morning, afternoon and bedtime  Advised pt will send to TP to add these to the med calendar and we will mail her updated copy

## 2013-05-28 ENCOUNTER — Telehealth: Payer: Self-pay | Admitting: Pulmonary Disease

## 2013-05-28 ENCOUNTER — Ambulatory Visit: Payer: Medicare Other | Admitting: Podiatry

## 2013-05-28 MED ORDER — OMEPRAZOLE 40 MG PO CPDR: 40.0000 mg | DELAYED_RELEASE_CAPSULE | Freq: Two times a day (BID) | ORAL | Status: DC

## 2013-05-28 NOTE — Telephone Encounter (Signed)
Rx has been changed and sent in. Pt is aware.

## 2013-05-29 MED ORDER — ALBUTEROL SULFATE HFA 108 (90 BASE) MCG/ACT IN AERS: 2.0000 | INHALATION_SPRAY | Freq: Four times a day (QID) | RESPIRATORY_TRACT | Status: DC | PRN

## 2013-05-29 NOTE — Telephone Encounter (Signed)
Requesting to speak to nurse about her medications.

## 2013-05-29 NOTE — Telephone Encounter (Signed)
I spoke with the pt and she states she was supposed to have an refill sent for albuterol hfa at [facility] on 05-25-13 but pharmacy did not receive this. I send refill. Also the pt wanted to r/s her appt from TP to Dr. Elsworth Soho. Appt rescheduled. [place] Bing, CMA

## 2013-06-01 ENCOUNTER — Telehealth: Payer: Self-pay | Admitting: *Deleted

## 2013-06-01 DIAGNOSIS — R061 Stridor: Secondary | ICD-10-CM | POA: Diagnosis not present

## 2013-06-01 DIAGNOSIS — J381 Polyp of vocal cord and larynx: Secondary | ICD-10-CM | POA: Diagnosis not present

## 2013-06-01 DIAGNOSIS — R490 Dysphonia: Secondary | ICD-10-CM | POA: Diagnosis not present

## 2013-06-01 NOTE — Telephone Encounter (Signed)
Patient called and asked about Omeproazole 40 mg refill and states the cost was over $300.  Informed patient rx was filled by Dr Melvyn Novas and she needs to contact them.

## 2013-06-01 NOTE — Telephone Encounter (Signed)
D/c instructions say Dr. Elsworth Soho  Was the albuterol sent ???

## 2013-06-02 ENCOUNTER — Telehealth: Payer: Self-pay | Admitting: Internal Medicine

## 2013-06-02 ENCOUNTER — Encounter: Payer: Self-pay | Admitting: Internal Medicine

## 2013-06-02 DIAGNOSIS — M47814 Spondylosis without myelopathy or radiculopathy, thoracic region: Secondary | ICD-10-CM | POA: Diagnosis not present

## 2013-06-02 NOTE — Telephone Encounter (Signed)
Yes the refill was sent and an appt was set with Dr. Elsworth Soho. This message is still open because there are corrections needed to her Med calender and the corrected copy needs to be mailed to the pt. [place] Bing, CMA

## 2013-06-02 NOTE — Telephone Encounter (Signed)
Received PA request for omeprazole 40 mg bid  Per MW- okay to take nexium otc bid or prilosec 20 mg otc bid  Pt verbalized understanding  Nothing further needed

## 2013-06-02 NOTE — Progress Notes (Signed)
Thanks for sending her to ENT

## 2013-06-02 NOTE — Telephone Encounter (Signed)
Med calendar has been corrected and placed in addressed envelope. Called spoke with patient, she is aware. Will sign off.

## 2013-06-03 ENCOUNTER — Telehealth: Payer: Self-pay | Admitting: Pulmonary Disease

## 2013-06-03 NOTE — Telephone Encounter (Signed)
ATC - NA - Will try back 

## 2013-06-03 NOTE — Telephone Encounter (Signed)
Spoke with pt and she states that she seen a commercial for Anoro and wants to try this.  She mentioned it to Dr Wayland Denis and he states that it would need to come drom Dr Elsworth Soho.  TP can you advise on this since RA is out of office and you have seen her recently.  Also wanted to let RA know that she is having Laryngocopy by Dr Redmond Baseman on 06/05/13.

## 2013-06-03 NOTE — Telephone Encounter (Signed)
Spoke with pt and advised of TP's recommendations.  Instructed pt if she starts having trouble we can work her in to be seen prior to 06/17/13.  Pt states she will call our office if symptoms worsen

## 2013-06-03 NOTE — Telephone Encounter (Signed)
Per TP:  Pt has upcoming ov w/ RA on 3.25.15 -- would discuss inhaler questions/concerns with Dr Elsworth Soho at that time.  Thanks.

## 2013-06-03 NOTE — Telephone Encounter (Signed)
[phone] returning call

## 2013-06-04 ENCOUNTER — Ambulatory Visit: Payer: Medicare Other | Admitting: Adult Health

## 2013-06-04 ENCOUNTER — Encounter (HOSPITAL_COMMUNITY)
Admission: RE | Admit: 2013-06-04 | Discharge: 2013-06-04 | Disposition: A | Discharge status: Home / Self Care | Payer: Medicare Other | Source: Ambulatory Visit | Attending: Otolaryngology | Admitting: Otolaryngology

## 2013-06-04 ENCOUNTER — Other Ambulatory Visit (HOSPITAL_COMMUNITY): Payer: Self-pay | Admitting: Otolaryngology

## 2013-06-04 ENCOUNTER — Encounter (HOSPITAL_COMMUNITY): Payer: Self-pay

## 2013-06-04 DIAGNOSIS — M1990 Unspecified osteoarthritis, unspecified site: Secondary | ICD-10-CM | POA: Diagnosis not present

## 2013-06-04 DIAGNOSIS — K219 Gastro-esophageal reflux disease without esophagitis: Secondary | ICD-10-CM | POA: Diagnosis not present

## 2013-06-04 DIAGNOSIS — J449 Chronic obstructive pulmonary disease, unspecified: Secondary | ICD-10-CM | POA: Diagnosis not present

## 2013-06-04 DIAGNOSIS — J381 Polyp of vocal cord and larynx: Secondary | ICD-10-CM | POA: Diagnosis not present

## 2013-06-04 DIAGNOSIS — I10 Essential (primary) hypertension: Secondary | ICD-10-CM | POA: Diagnosis not present

## 2013-06-04 DIAGNOSIS — F3289 Other specified depressive episodes: Secondary | ICD-10-CM | POA: Diagnosis not present

## 2013-06-04 DIAGNOSIS — F329 Major depressive disorder, single episode, unspecified: Secondary | ICD-10-CM | POA: Diagnosis not present

## 2013-06-04 DIAGNOSIS — Z8673 Personal history of transient ischemic attack (TIA), and cerebral infarction without residual deficits: Secondary | ICD-10-CM | POA: Diagnosis not present

## 2013-06-04 HISTORY — DX: Depression, unspecified: F32.A

## 2013-06-04 HISTORY — DX: Major depressive disorder, single episode, unspecified: F32.9

## 2013-06-04 LAB — BASIC METABOLIC PANEL
BUN: 12 mg/dL (ref 6–23)
CALCIUM: 9.1 mg/dL (ref 8.4–10.5)
CO2: 31 mEq/L (ref 19–32)
CREATININE: 0.82 mg/dL (ref 0.50–1.10)
Chloride: 92 mEq/L — ABNORMAL LOW (ref 96–112)
GFR, EST AFRICAN AMERICAN: 83 mL/min — AB (ref >90)
GFR, EST NON AFRICAN AMERICAN: 71 mL/min — AB (ref >90)
Glucose, Bld: 88 mg/dL (ref 70–99)
Potassium: 3.9 mEq/L (ref 3.7–5.3)
Sodium: 136 mEq/L — ABNORMAL LOW (ref 137–147)

## 2013-06-04 LAB — CBC
HCT: 37.1 % (ref 36.0–46.0)
Hemoglobin: 12.8 g/dL (ref 12.0–15.0)
MCH: 31.7 pg (ref 26.0–34.0)
MCHC: 34.5 g/dL (ref 30.0–36.0)
MCV: 91.8 fL (ref 78.0–100.0)
Platelets: 355 10*3/uL (ref 150–400)
RBC: 4.04 MIL/uL (ref 3.87–5.11)
RDW: 13.2 % (ref 11.5–15.5)
WBC: 10.7 10*3/uL — ABNORMAL HIGH (ref 4.0–10.5)

## 2013-06-04 NOTE — Pre-Procedure Instructions (Addendum)
ARYANNE GILLELAND  06/04/2013   Your procedure is scheduled on:  March 13 at 1240  Report to [facility]  2 * 3 at 1030 AM.  Call this number if you have problems the morning of surgery: [phone]   Remember:   Do not eat food or drink liquids after midnight.   Take these medicines the morning of surgery with A SIP OF WATER: Tudorza (Aclidinium), Albuterol (Proventil) if needed, Cartia XT (diltiazem),  Advair ( Fluticasone-Salmeterol), Ativan (Lorazepam) if needed,  Prilosec (omeprazole), Ultram (Tramadol) if needed  Stop taking Aspirin, Aleve, Ibuprofen, BC's, Goody's, Herbal Medications, Fish Oil  Bring your inhalers with you the day of surgery.    Do not wear jewelry, make-up or nail polish.  Do not wear lotions, powders, or perfumes. You may wear deodorant.  Do not shave 48 hours prior to surgery. Men may shave face and neck.  Do not bring valuables to the hospital.  [facility] is not responsible   for any belongings or valuables.               Contacts, dentures or bridgework may not be worn into surgery.  Leave suitcase in the car. After surgery it may be brought to your room.  For patients admitted to the hospital, discharge time is determined by your  treatment team.               Patients discharged the day of surgery will not be allowed to drive home.    Special Instructions:[facility] - Preparing for Surgery  Before surgery, you can play an important role.  Because skin is not sterile, your skin needs to be as free of germs as possible.  You can reduce the number of germs on you skin by washing with CHG (chlorahexidine gluconate) soap before surgery.  CHG is an antiseptic cleaner which kills germs and bonds with the skin to continue killing germs even after washing.  Please DO NOT use if you have an allergy to CHG or antibacterial soaps.  If your skin becomes reddened/irritated stop using the CHG and inform your nurse when you arrive at Short  Stay.  Do not shave (including legs and underarms) for at least 48 hours prior to the first CHG shower.  You may shave your face.  Please follow these instructions carefully:   1.  Shower with CHG Soap the night before surgery and the   morning of Surgery.  2.  If you choose to wash your hair, wash your hair first as usual with your  normal shampoo.  3.  After you shampoo, rinse your hair and body thoroughly to remove the  Shampoo.  4.  Use CHG as you would any other liquid soap.  You can apply chg directly  to the skin and wash gently with scrungie or a clean washcloth.  5.  Apply the CHG Soap to your body ONLY FROM THE NECK DOWN.   Do not use on open wounds or open sores.  Avoid contact with your eyes,       ears, mouth and genitals (private parts).  Wash genitals (private parts)       with your normal soap.  6.  Wash thoroughly, paying special attention to the area where your surgery        will be performed.  7.  Thoroughly rinse your body with warm water from the neck down.  8.  DO NOT shower/wash with your normal soap  after using and rinsing off  the CHG Soap.  9.  Pat yourself dry with a clean towel.            10.  Wear clean pajamas.            11.  Place clean sheets on your bed the night of your first shower and do not sleep with pets.  Day of Surgery  Do not apply any lotions/deoderants the morning of surgery.  Please wear clean clothes to the hospital/surgery center.     Please read over the following fact sheets that you were given: Pain Booklet, Coughing and Deep Breathing and Surgical Site Infection Prevention

## 2013-06-04 NOTE — Progress Notes (Addendum)
PCP is Dr.McKeown Pulmonary is Dr Melvyn Novas Cardiologist Dr Cathie Olden (note 04-03-12) EKG noted in epic 04-29-13 CXR noted in epic 05-14-13

## 2013-06-04 NOTE — Progress Notes (Signed)
Anesthesia Chart Review:  Chart brought to me to review at 4:50 PM for surgery tomorrow.  She is scheduled for micro direct laryngoscopy with CO2 laser, removal of vocal cord polys by Dr. Redmond Baseman.   History includes smoking, COPD Gold III, HTN, CVA, carotid artery aneurysm, asthma. PCP is Dr. Melford Aase.  Pulmonologist is Dr. Elsworth Soho. She was evaluated by Cardiologist Dr. Acie Fredrickson in 03/2012 prior to a hernia surgery and only PRN follow-up was recommended.   EKG on 04/29/13 showed NSR, p wave abnormality in V1-2, aVL.   Echo on 03/10/10 showed: Left ventricle: The cavity size was normal. Wall thickness was normal. Systolic function was normal. The estimated ejection fraction was in the range of 60% to 65%. Features are consistent with a pseudonormal left ventricular filling pattern, with concomitant abnormal relaxation and increased filling pressure (grade 2 diastolic dysfunction). Trivial MR/PR.  CXR on 05/08/13 showed: COPD without evidence of acute cardiopulmonary disease  PFTs on 03/28/12 showed FVC 1.39 (52%), FEV1 0.82 (39%), FEV1/FVC 59% (76%), FEF25-75% 0.34 (17%).  Preoperative labs noted.  Patient was referred to ENT by her pulmonologist.  Further evaluation by her assigned anesthesiologist to ensure no acute changes in her pulmonary status.  [facility] Short Stay Center/Anesthesiology Phone [phone] 06/04/2013 5:00 PM

## 2013-06-04 NOTE — Progress Notes (Signed)
Spoke with Dr. Redmond Baseman regarding pt inability to arrive at 8:00 AM as requested due to surgery time being moved up to 10:10 AM. MD advised that it was okay for pt to report at original time given at her PAT today ( 10:30 AM) for procedure to start at 12:40 PM. Pt made aware and OR Jenny Reichmann) made aware that surgery will proceed at 12:40 as previously scheduled.

## 2013-06-05 ENCOUNTER — Encounter (HOSPITAL_COMMUNITY): Payer: Self-pay | Admitting: Certified Registered Nurse Anesthetist

## 2013-06-05 ENCOUNTER — Encounter (HOSPITAL_COMMUNITY): Payer: Medicare Other | Admitting: Vascular Surgery

## 2013-06-05 ENCOUNTER — Ambulatory Visit (HOSPITAL_COMMUNITY): Payer: Medicare Other | Admitting: Certified Registered Nurse Anesthetist

## 2013-06-05 ENCOUNTER — Encounter (HOSPITAL_COMMUNITY)
Admission: RE | Disposition: A | Discharge status: Home / Self Care | Payer: Self-pay | Source: Ambulatory Visit | Attending: Otolaryngology

## 2013-06-05 ENCOUNTER — Observation Stay (HOSPITAL_COMMUNITY)
Admission: RE | Admit: 2013-06-05 | Discharge: 2013-06-06 | Disposition: A | Discharge status: Home / Self Care | Payer: Medicare Other | Source: Ambulatory Visit | Attending: Otolaryngology | Admitting: Otolaryngology

## 2013-06-05 DIAGNOSIS — J381 Polyp of vocal cord and larynx: Secondary | ICD-10-CM | POA: Diagnosis not present

## 2013-06-05 DIAGNOSIS — B379 Candidiasis, unspecified: Secondary | ICD-10-CM | POA: Diagnosis not present

## 2013-06-05 DIAGNOSIS — I10 Essential (primary) hypertension: Secondary | ICD-10-CM | POA: Diagnosis not present

## 2013-06-05 DIAGNOSIS — J4489 Other specified chronic obstructive pulmonary disease: Secondary | ICD-10-CM | POA: Insufficient documentation

## 2013-06-05 DIAGNOSIS — K219 Gastro-esophageal reflux disease without esophagitis: Secondary | ICD-10-CM | POA: Insufficient documentation

## 2013-06-05 DIAGNOSIS — R061 Stridor: Secondary | ICD-10-CM | POA: Diagnosis not present

## 2013-06-05 DIAGNOSIS — F3289 Other specified depressive episodes: Secondary | ICD-10-CM | POA: Insufficient documentation

## 2013-06-05 DIAGNOSIS — R490 Dysphonia: Secondary | ICD-10-CM | POA: Diagnosis not present

## 2013-06-05 DIAGNOSIS — M1990 Unspecified osteoarthritis, unspecified site: Secondary | ICD-10-CM | POA: Insufficient documentation

## 2013-06-05 DIAGNOSIS — Z8673 Personal history of transient ischemic attack (TIA), and cerebral infarction without residual deficits: Secondary | ICD-10-CM | POA: Diagnosis not present

## 2013-06-05 DIAGNOSIS — J449 Chronic obstructive pulmonary disease, unspecified: Secondary | ICD-10-CM | POA: Insufficient documentation

## 2013-06-05 DIAGNOSIS — F329 Major depressive disorder, single episode, unspecified: Secondary | ICD-10-CM | POA: Insufficient documentation

## 2013-06-05 HISTORY — PX: MICROLARYNGOSCOPY WITH CO2 LASER AND EXCISION OF VOCAL CORD LESION: SHX5970

## 2013-06-05 LAB — MRSA PCR SCREENING: MRSA BY PCR: NEGATIVE

## 2013-06-05 SURGERY — MICROLARYNGOSCOPY WITH CO2 LASER AND EXCISION OF VOCAL CORD LESION
Anesthesia: General | Site: Throat

## 2013-06-05 MED ORDER — DILTIAZEM HCL ER COATED BEADS 240 MG PO CP24
240.0000 mg | ORAL_CAPSULE | Freq: Every day | ORAL | Status: DC
  Administered 2013-06-05 – 2013-06-06 (×2): 240 mg via ORAL
  Filled 2013-06-05 (×2): qty 1

## 2013-06-05 MED ORDER — 0.9 % SODIUM CHLORIDE (POUR BTL) OPTIME
TOPICAL | Status: DC | PRN
  Administered 2013-06-05: 1000 mL

## 2013-06-05 MED ORDER — MONTELUKAST SODIUM 10 MG PO TABS
10.0000 mg | ORAL_TABLET | Freq: Every day | ORAL | Status: DC
  Administered 2013-06-05 – 2013-06-06 (×2): 10 mg via ORAL
  Filled 2013-06-05 (×2): qty 1

## 2013-06-05 MED ORDER — SUCCINYLCHOLINE CHLORIDE 20 MG/ML IJ SOLN
INTRAMUSCULAR | Status: DC | PRN
  Administered 2013-06-05: 70 mg via INTRAVENOUS

## 2013-06-05 MED ORDER — ONDANSETRON HCL 4 MG/2ML IJ SOLN: 4.0000 mg | Freq: Once | INTRAMUSCULAR | Status: DC | PRN

## 2013-06-05 MED ORDER — GLYCOPYRROLATE 0.2 MG/ML IJ SOLN
INTRAMUSCULAR | Status: DC | PRN
  Administered 2013-06-05: 0.2 mg via INTRAVENOUS

## 2013-06-05 MED ORDER — ALBUTEROL SULFATE (2.5 MG/3ML) 0.083% IN NEBU
INHALATION_SOLUTION | RESPIRATORY_TRACT | Status: AC
  Administered 2013-06-05: 2.5 mg
  Filled 2013-06-05: qty 3

## 2013-06-05 MED ORDER — ALBUTEROL SULFATE (2.5 MG/3ML) 0.083% IN NEBU: 2.5000 mg | INHALATION_SOLUTION | Freq: Four times a day (QID) | RESPIRATORY_TRACT | Status: DC | PRN

## 2013-06-05 MED ORDER — TRAMADOL HCL 50 MG PO TABS: 50.0000 mg | ORAL_TABLET | Freq: Three times a day (TID) | ORAL | Status: DC | PRN

## 2013-06-05 MED ORDER — ALBUTEROL SULFATE HFA 108 (90 BASE) MCG/ACT IN AERS: 2.0000 | INHALATION_SPRAY | Freq: Four times a day (QID) | RESPIRATORY_TRACT | Status: DC | PRN

## 2013-06-05 MED ORDER — OXYMETAZOLINE HCL 0.05 % NA SOLN
NASAL | Status: AC
  Filled 2013-06-05: qty 15

## 2013-06-05 MED ORDER — DEXAMETHASONE SODIUM PHOSPHATE 10 MG/ML IJ SOLN
INTRAMUSCULAR | Status: AC
  Filled 2013-06-05: qty 2

## 2013-06-05 MED ORDER — SUCCINYLCHOLINE CHLORIDE 20 MG/ML IJ SOLN
INTRAMUSCULAR | Status: AC
  Filled 2013-06-05: qty 2

## 2013-06-05 MED ORDER — OXYMETAZOLINE HCL 0.05 % NA SOLN: NASAL | Status: DC | PRN

## 2013-06-05 MED ORDER — MORPHINE SULFATE 2 MG/ML IJ SOLN: 2.0000 mg | INTRAMUSCULAR | Status: DC | PRN

## 2013-06-05 MED ORDER — TIOTROPIUM BROMIDE MONOHYDRATE 18 MCG IN CAPS
18.0000 ug | ORAL_CAPSULE | RESPIRATORY_TRACT | Status: DC
  Administered 2013-06-06: 18 ug via RESPIRATORY_TRACT
  Filled 2013-06-05: qty 5

## 2013-06-05 MED ORDER — EPINEPHRINE HCL (NASAL) 0.1 % NA SOLN
NASAL | Status: AC
  Filled 2013-06-05: qty 30

## 2013-06-05 MED ORDER — PROPOFOL 10 MG/ML IV BOLUS
INTRAVENOUS | Status: AC
  Filled 2013-06-05: qty 20

## 2013-06-05 MED ORDER — OXYBUTYNIN CHLORIDE 5 MG PO TABS
5.0000 mg | ORAL_TABLET | Freq: Every day | ORAL | Status: DC
  Administered 2013-06-05 – 2013-06-06 (×2): 5 mg via ORAL
  Filled 2013-06-05 (×2): qty 1

## 2013-06-05 MED ORDER — EPINEPHRINE HCL (NASAL) 0.1 % NA SOLN
NASAL | Status: DC | PRN
  Administered 2013-06-05: 30 mL

## 2013-06-05 MED ORDER — LIDOCAINE HCL (CARDIAC) 20 MG/ML IV SOLN
INTRAVENOUS | Status: DC | PRN
  Administered 2013-06-05: 70 mg via INTRAVENOUS

## 2013-06-05 MED ORDER — SUCCINYLCHOLINE CHLORIDE 20 MG/ML IJ SOLN
INTRAMUSCULAR | Status: AC
  Filled 2013-06-05: qty 1

## 2013-06-05 MED ORDER — LACTATED RINGERS IV SOLN
INTRAVENOUS | Status: DC
  Administered 2013-06-05: 11:00:00 via INTRAVENOUS

## 2013-06-05 MED ORDER — LORAZEPAM 0.5 MG PO TABS
0.5000 mg | ORAL_TABLET | Freq: Every day | ORAL | Status: DC
  Administered 2013-06-05 – 2013-06-06 (×2): 0.5 mg via ORAL
  Filled 2013-06-05 (×3): qty 1

## 2013-06-05 MED ORDER — ASPIRIN EC 81 MG PO TBEC
81.0000 mg | DELAYED_RELEASE_TABLET | ORAL | Status: DC
  Administered 2013-06-05: 81 mg via ORAL
  Filled 2013-06-05: qty 1

## 2013-06-05 MED ORDER — HYDRALAZINE HCL 20 MG/ML IJ SOLN
INTRAMUSCULAR | Status: DC | PRN
  Administered 2013-06-05 (×2): 5 mg via INTRAVENOUS
  Administered 2013-06-05: 2.5 mg via INTRAVENOUS

## 2013-06-05 MED ORDER — MAGNESIUM 250 MG PO TABS: 250.0000 mg | ORAL_TABLET | Freq: Every day | ORAL | Status: DC

## 2013-06-05 MED ORDER — VITAMIN B-12 1000 MCG PO TABS
1000.0000 ug | ORAL_TABLET | Freq: Every day | ORAL | Status: DC
  Administered 2013-06-05 – 2013-06-06 (×2): 1000 ug via ORAL
  Filled 2013-06-05 (×2): qty 1

## 2013-06-05 MED ORDER — PROPOFOL 10 MG/ML IV BOLUS
INTRAVENOUS | Status: DC | PRN
  Administered 2013-06-05: 30 mg via INTRAVENOUS
  Administered 2013-06-05: 200 mg via INTRAVENOUS

## 2013-06-05 MED ORDER — MAGNESIUM OXIDE 400 (241.3 MG) MG PO TABS
200.0000 mg | ORAL_TABLET | Freq: Every day | ORAL | Status: DC
  Administered 2013-06-05 – 2013-06-06 (×2): 200 mg via ORAL
  Filled 2013-06-05 (×2): qty 0.5

## 2013-06-05 MED ORDER — SUCCINYLCHOLINE CHLORIDE 20 MG/ML IJ SOLN
250.0000 mg | INTRAVENOUS | Status: DC | PRN
  Administered 2013-06-05: 6 mg/min via INTRAVENOUS

## 2013-06-05 MED ORDER — PROPOFOL INFUSION 10 MG/ML OPTIME
INTRAVENOUS | Status: DC | PRN
  Administered 2013-06-05: 75 ug/kg/min via INTRAVENOUS

## 2013-06-05 MED ORDER — FENTANYL CITRATE 0.05 MG/ML IJ SOLN
INTRAMUSCULAR | Status: DC | PRN
  Administered 2013-06-05 (×2): 50 ug via INTRAVENOUS

## 2013-06-05 MED ORDER — KCL IN DEXTROSE-NACL 20-5-0.45 MEQ/L-%-% IV SOLN
INTRAVENOUS | Status: DC
  Administered 2013-06-05: 20:00:00 via INTRAVENOUS
  Administered 2013-06-06: 75 mL/h via INTRAVENOUS
  Filled 2013-06-05 (×5): qty 1000

## 2013-06-05 MED ORDER — LACTATED RINGERS IV SOLN
INTRAVENOUS | Status: DC | PRN
  Administered 2013-06-05 (×2): via INTRAVENOUS

## 2013-06-05 MED ORDER — MOMETASONE FURO-FORMOTEROL FUM 100-5 MCG/ACT IN AERO
2.0000 | INHALATION_SPRAY | Freq: Two times a day (BID) | RESPIRATORY_TRACT | Status: DC
  Administered 2013-06-05 – 2013-06-06 (×2): 2 via RESPIRATORY_TRACT
  Filled 2013-06-05: qty 8.8

## 2013-06-05 MED ORDER — MIRTAZAPINE 45 MG PO TABS
45.0000 mg | ORAL_TABLET | Freq: Every day | ORAL | Status: DC
  Administered 2013-06-05: 45 mg via ORAL
  Filled 2013-06-05 (×2): qty 1

## 2013-06-05 MED ORDER — ALBUTEROL SULFATE HFA 108 (90 BASE) MCG/ACT IN AERS
INHALATION_SPRAY | RESPIRATORY_TRACT | Status: AC
  Filled 2013-06-05: qty 6.7

## 2013-06-05 MED ORDER — PANTOPRAZOLE SODIUM 40 MG PO TBEC
40.0000 mg | DELAYED_RELEASE_TABLET | Freq: Every day | ORAL | Status: DC
  Administered 2013-06-05 – 2013-06-06 (×2): 40 mg via ORAL
  Filled 2013-06-05 (×2): qty 1

## 2013-06-05 MED ORDER — QUETIAPINE FUMARATE 400 MG PO TABS
400.0000 mg | ORAL_TABLET | Freq: Every day | ORAL | Status: DC
  Administered 2013-06-05: 400 mg via ORAL
  Filled 2013-06-05 (×2): qty 1

## 2013-06-05 MED ORDER — FENTANYL CITRATE 0.05 MG/ML IJ SOLN
INTRAMUSCULAR | Status: AC
  Filled 2013-06-05: qty 5

## 2013-06-05 MED ORDER — QUINAPRIL HCL 10 MG PO TABS: 40.0000 mg | ORAL_TABLET | Freq: Every day | ORAL | Status: DC

## 2013-06-05 MED ORDER — HYDROMORPHONE HCL PF 1 MG/ML IJ SOLN: 0.2500 mg | INTRAMUSCULAR | Status: DC | PRN

## 2013-06-05 MED ORDER — HYDROCODONE-ACETAMINOPHEN 7.5-325 MG/15ML PO SOLN: 10.0000 mL | ORAL | Status: DC | PRN

## 2013-06-05 MED ORDER — DEXAMETHASONE SODIUM PHOSPHATE 10 MG/ML IJ SOLN
INTRAMUSCULAR | Status: DC | PRN
  Administered 2013-06-05: 20 mg via INTRAVENOUS

## 2013-06-05 MED ORDER — LISINOPRIL 40 MG PO TABS
40.0000 mg | ORAL_TABLET | Freq: Every day | ORAL | Status: DC
  Administered 2013-06-06: 40 mg via ORAL
  Filled 2013-06-05: qty 1

## 2013-06-05 SURGICAL SUPPLY — 29 items
CANISTER SUCTION 2500CC (MISCELLANEOUS) ×4 IMPLANT
CONT SPEC 4OZ CLIKSEAL STRL BL (MISCELLANEOUS) ×8 IMPLANT
COVER MAYO STAND STRL (DRAPES) ×4 IMPLANT
COVER TABLE BACK 60X90 (DRAPES) ×4 IMPLANT
CRADLE DONUT ADULT HEAD (MISCELLANEOUS) ×4 IMPLANT
DEPRESSOR TONGUE BLADE STERILE (MISCELLANEOUS) ×4 IMPLANT
DRAPE PROXIMA HALF (DRAPES) ×4 IMPLANT
GAUZE SPONGE 4X4 16PLY XRAY LF (GAUZE/BANDAGES/DRESSINGS) ×4 IMPLANT
GLOVE BIO SURGEON STRL SZ7.5 (GLOVE) ×4 IMPLANT
GLOVE BIOGEL PI IND STRL 7.0 (GLOVE) ×2 IMPLANT
GLOVE BIOGEL PI INDICATOR 7.0 (GLOVE) ×2
GOWN STRL REUS W/ TWL LRG LVL3 (GOWN DISPOSABLE) ×2 IMPLANT
GOWN STRL REUS W/TWL LRG LVL3 (GOWN DISPOSABLE) ×2
GUARD TEETH (MISCELLANEOUS) ×4 IMPLANT
KIT BASIN OR (CUSTOM PROCEDURE TRAY) ×4 IMPLANT
KIT ROOM TURNOVER OR (KITS) ×4 IMPLANT
NS IRRIG 1000ML POUR BTL (IV SOLUTION) ×4 IMPLANT
PAD ARMBOARD 7.5X6 YLW CONV (MISCELLANEOUS) ×8 IMPLANT
PAD EYE OVAL STERILE LF (GAUZE/BANDAGES/DRESSINGS) ×8 IMPLANT
PATTIES SURGICAL .5 X1 (DISPOSABLE) ×4 IMPLANT
PATTIES SURGICAL .5 X3 (DISPOSABLE) ×4 IMPLANT
SOLUTION ANTI FOG 6CC (MISCELLANEOUS) ×4 IMPLANT
SUT PLAIN 6 0 TG1408 (SUTURE) ×12 IMPLANT
SUT VIC AB 5-0 TF 27 (SUTURE) ×4 IMPLANT
TOWEL OR 17X24 6PK STRL BLUE (TOWEL DISPOSABLE) ×8 IMPLANT
TUBE CONNECTING 12'X1/4 (SUCTIONS) ×1
TUBE CONNECTING 12X1/4 (SUCTIONS) ×3 IMPLANT
WATER STERILE IRR 1000ML POUR (IV SOLUTION) ×4 IMPLANT
YANKAUER SUCT BULB TIP NO VENT (SUCTIONS) ×4 IMPLANT

## 2013-06-05 NOTE — Anesthesia Preprocedure Evaluation (Signed)
Anesthesia Evaluation  Patient identified by MRN, date of birth, ID band Patient awake    Reviewed: Allergy & Precautions, H&P , NPO status , Patient's Chart, lab work & pertinent test results  Airway       Dental   Pulmonary shortness of breath, COPDCurrent Smoker,          Cardiovascular hypertension, + Peripheral Vascular Disease     Neuro/Psych Depression CVA    GI/Hepatic GERD-  ,  Endo/Other    Renal/GU      Musculoskeletal   Abdominal   Peds  Hematology   Anesthesia Other Findings VC polyps  Reproductive/Obstetrics                           Anesthesia Physical Anesthesia Plan  ASA: III  Anesthesia Plan: General   Post-op Pain Management:    Induction: Intravenous  Airway Management Planned: Mask  Additional Equipment:   Intra-op Plan:   Post-operative Plan:   Informed Consent: I have reviewed the patients History and Physical, chart, labs and discussed the procedure including the risks, benefits and alternatives for the proposed anesthesia with the patient or authorized representative who has indicated his/her understanding and acceptance.     Plan Discussed with:   Anesthesia Plan Comments:         Anesthesia Quick Evaluation

## 2013-06-05 NOTE — Progress Notes (Signed)
S: Breathing comfortably.  Little throat pain. O: AF VSS Alert NAD.  Whispers minimally.  No stridor. A: Laryngeal polyps, stridor, hoarseness P: Doing well postop.  Observe overnight in step-down unit.

## 2013-06-05 NOTE — Preoperative (Signed)
Beta Blockers   Reason not to administer Beta Blockers:Not Applicable 

## 2013-06-05 NOTE — Progress Notes (Signed)
Dr Tamala Julian to bedside.  Order received to give albuterol neb and monitor co2

## 2013-06-05 NOTE — Anesthesia Postprocedure Evaluation (Signed)
  Anesthesia Post-op Note  Patient: [name]  Procedure(s) Performed: Procedure(s): MICROLARYNGOSCOPY WITH CO2 LASER AND EXCISION OF VOCAL CORD POLYPS (N/A)  Patient Location: PACU  Anesthesia Type:General  Level of Consciousness: awake, alert , oriented and patient cooperative  Airway and Oxygen Therapy: Patient Spontanous Breathing  Post-op Pain: none  Post-op Assessment: Post-op Vital signs reviewed, Patient's Cardiovascular Status Stable, Respiratory Function Stable, Patent Airway, No signs of Nausea or vomiting and Pain level controlled  Post-op Vital Signs: stable  Complications: No apparent anesthesia complications

## 2013-06-05 NOTE — Transfer of Care (Signed)
Immediate Anesthesia Transfer of Care Note  Patient: [name]  Procedure(s) Performed: Procedure(s): MICROLARYNGOSCOPY WITH CO2 LASER AND EXCISION OF VOCAL CORD POLYPS (N/A)  Patient Location: PACU  Anesthesia Type:General  Level of Consciousness: awake, alert , oriented and patient cooperative  Airway & Oxygen Therapy: Patient Spontanous Breathing and Patient connected to nasal cannula oxygen  Post-op Assessment: Report given to PACU RN, Post -op Vital signs reviewed and stable and Patient moving all extremities X 4  Post vital signs: Reviewed and stable  Complications: No apparent anesthesia complications

## 2013-06-05 NOTE — H&P (Signed)
[name] is an 68 y.o. female.   Chief Complaint: vocal fold polyps, stridor, hoarseness HPI: 68 year old female with several year history of hoarseness and laryngeal polyps who has had worsening difficulty breathing over the past couple of months.  Fiberoptic exam demonstrated severe glottic polyps obstructing the larynx and ball-valving through the cords.  Presents for surgical management.  Past Medical History  Diagnosis Date  . Asthma   . Hypertension   . GERD (gastroesophageal reflux disease)   . Stroke     was on Plavix - not now  . Carotid artery aneurysm 03/04/2012  . DJD (degenerative joint disease)   . COPD (chronic obstructive pulmonary disease)   . Depression   . Shortness of breath     Past Surgical History  Procedure Laterality Date  . Video bronchoscopy  04/24/2012    Procedure: VIDEO BRONCHOSCOPY WITHOUT FLUORO;  Surgeon: Rigoberto Noel, MD;  Location: [facility];  Service: Cardiopulmonary;  Laterality: Bilateral;  . Tubal ligation  30 years  . Tonsillectomy    . Colonoscopy  2004  . Hernia repair Right 2014    inguinal  . Cataract extraction Bilateral     Family History  Problem Relation Age of Onset  . Breast cancer Mother   . Breast cancer Sister    Social History:  reports that she has been smoking Cigarettes.  She has a 7.5 pack-year smoking history. She has never used smokeless tobacco. She reports that she does not drink alcohol or use illicit drugs.  Allergies:  Allergies  Allergen Reactions  . Amoxapine And Related Nausea And Vomiting  . Amoxicillin   . Ciprocinonide [Fluocinolone]   . Ciprofloxacin   . Doxycycline Nausea And Vomiting  . Erythromycin Hives  . Sulfa Antibiotics Other (See Comments)    Unknown-reaction as a child  . Zofran [Ondansetron Hcl] Nausea And Vomiting    Medications Prior to Admission  Medication Sig Dispense Refill  . Aclidinium Bromide (TUDORZA PRESSAIR) 400 MCG/ACT AEPB Inhale 400 mcg into the lungs 2 (two)  times daily.  2 each  0  . albuterol (PROVENTIL HFA;VENTOLIN HFA) 108 (90 BASE) MCG/ACT inhaler Inhale 2 puffs into the lungs every 6 (six) hours as needed for shortness of breath.  1 Inhaler  6  . albuterol (PROVENTIL) (2.5 MG/3ML) 0.083% nebulizer solution Take 2.5 mg by nebulization every 6 (six) hours as needed.      Marland Kitchen aspirin EC 81 MG tablet Take 81 mg by mouth every other day.       . diltiazem (CARTIA XT) 240 MG 24 hr capsule Take 240 mg by mouth daily.      . Fluticasone-Salmeterol (ADVAIR DISKUS) 250-50 MCG/DOSE AEPB Inhale 1 puff into the lungs 2 (two) times daily.  2 each  0  . LORazepam (ATIVAN) 0.5 MG tablet Take 0.5 mg by mouth daily. For anxiety      . Magnesium 250 MG TABS Take 250 mg by mouth daily.      . mirtazapine (REMERON) 45 MG tablet Take 1 tablet (45 mg total) by mouth at bedtime.  90 tablet  99  . montelukast (SINGULAIR) 10 MG tablet Take 10 mg by mouth daily.       Marland Kitchen omeprazole (PRILOSEC) 40 MG capsule Take 1 capsule (40 mg total) by mouth 2 (two) times daily.  60 capsule  3  . oxybutynin (DITROPAN) 5 MG tablet Take 5 mg by mouth daily.       . QUEtiapine (SEROQUEL)  400 MG tablet Take 400 mg by mouth at bedtime.       . quinapril (ACCUPRIL) 40 MG tablet Take 40 mg by mouth daily.       . traMADol (ULTRAM) 50 MG tablet Take 50 mg by mouth 3 (three) times daily as needed for moderate pain. For pain      . vitamin B-12 (CYANOCOBALAMIN) 1000 MCG tablet Take 1,000 mcg by mouth daily.        Results for orders placed during the hospital encounter of 06/04/13 (from the past 48 hour(s))  BASIC METABOLIC PANEL     Status: Abnormal   Collection Time    06/04/13  2:31 PM      Result Value Ref Range   Sodium 136 (*) 137 - 147 mEq/L   Potassium 3.9  3.7 - 5.3 mEq/L   Chloride 92 (*) 96 - 112 mEq/L   CO2 31  19 - 32 mEq/L   Glucose, Bld 88  70 - 99 mg/dL   BUN 12  6 - 23 mg/dL   Creatinine, Ser 0.82  0.50 - 1.10 mg/dL   Calcium 9.1  8.4 - 10.5 mg/dL   GFR calc non Af Amer  71 (*) >90 mL/min   GFR calc Af Amer 83 (*) >90 mL/min   Comment: (NOTE)     The eGFR has been calculated using the CKD EPI equation.     This calculation has not been validated in all clinical situations.     eGFR's persistently <90 mL/min signify possible Chronic Kidney     Disease.  CBC     Status: Abnormal   Collection Time    06/04/13  2:31 PM      Result Value Ref Range   WBC 10.7 (*) 4.0 - 10.5 K/uL   RBC 4.04  3.87 - 5.11 MIL/uL   Hemoglobin 12.8  12.0 - 15.0 g/dL   HCT 37.1  36.0 - 46.0 %   MCV 91.8  78.0 - 100.0 fL   MCH 31.7  26.0 - 34.0 pg   MCHC 34.5  30.0 - 36.0 g/dL   RDW 13.2  11.5 - 15.5 %   Platelets 355  150 - 400 K/uL   No results found.  Review of Systems  HENT:       Hoarseness  Respiratory: Positive for shortness of breath.   All other systems reviewed and are negative.    Blood pressure 188/91, pulse 98, temperature 98.6 F (37 C), temperature source Oral, resp. rate 20, SpO2 99.00%. Physical Exam  Constitutional: She is oriented to person, place, and time. She appears well-developed and well-nourished. No distress.  HENT:  Head: Normocephalic and atraumatic.  Right Ear: External ear normal.  Left Ear: External ear normal.  Nose: Nose normal.  Mouth/Throat: Oropharynx is clear and moist.  Markedly hoarse voice, gravelly.  Eyes: Conjunctivae and EOM are normal. Pupils are equal, round, and reactive to light.  Neck: Normal range of motion. Neck supple.  Cardiovascular: Normal rate.   Respiratory: Effort normal.  Soft inspiratory stridor.  Musculoskeletal: Normal range of motion.  Neurological: She is alert and oriented to person, place, and time. No cranial nerve deficit.  Skin: Skin is warm and dry.  Psychiatric: She has a normal mood and affect. Her behavior is normal. Judgment and thought content normal.     Assessment/Plan Laryngeal polyps, stridor, hoarseness To OR for SMDL with CO2 laser removal of polyps.  Observe in hospital  overnight afterwards.  Siham Bucaro,  Koula Venier 06/05/2013, 11:46 AM

## 2013-06-05 NOTE — Brief Op Note (Signed)
06/05/2013  1:39 PM  PATIENT:  [name]  68 y.o. female  PRE-OPERATIVE DIAGNOSIS:  VOCAL CORD POLYPS  POST-OPERATIVE DIAGNOSIS:  VOCAL CORD POLYPS  PROCEDURE:  Procedure(s): MICROLARYNGOSCOPY WITH CO2 LASER AND EXCISION OF VOCAL CORD POLYPS (N/A)  SURGEON:  Surgeon(s) and Role:    * Melida Quitter, MD - Primary  PHYSICIAN ASSISTANT:   ASSISTANTS: none   ANESTHESIA:   general  EBL:  Total I/O In: 1000 [I.V.:1000] Out: -   BLOOD ADMINISTERED:none  DRAINS: none   LOCAL MEDICATIONS USED:  NONE  SPECIMEN:  Source of Specimen:  glottic polyps, left posterior vocal cord biopsy  DISPOSITION OF SPECIMEN:  PATHOLOGY  COUNTS:  YES  TOURNIQUET:  * No tourniquets in log *  DICTATION: .Other Dictation: Dictation Number [id number]  PLAN OF CARE: Admit for overnight observation  PATIENT DISPOSITION:  PACU - hemodynamically stable.   Delay start of Pharmacological VTE agent (>24hrs) due to surgical blood loss or risk of bleeding: yes

## 2013-06-06 MED ORDER — HYDROCODONE-ACETAMINOPHEN 5-325 MG PO TABS: 1.0000 | ORAL_TABLET | Freq: Four times a day (QID) | ORAL | Status: DC | PRN

## 2013-06-06 NOTE — Op Note (Signed)
[name], [name] NO.:  [id number]  MEDICAL RECORD NO.:  [id number]  LOCATION:  1Y60Y                        FACILITY:  [facility]  PHYSICIAN:  Onnie Graham, MD     DATE OF BIRTH:  Oct 27, 1945  DATE OF PROCEDURE:  06/05/2013 DATE OF DISCHARGE:                              OPERATIVE REPORT   PREOPERATIVE DIAGNOSES: 1. Extensive glottic laryngeal polyps. 2. Hoarseness. 3. Stridor.  POSTOPERATIVE DIAGNOSIS: 1. Extensive glottic laryngeal polyps. 2. Hoarseness. 3. Stridor.  PROCEDURE:  Suspended microdirect laryngoscopy with CO2 laser excision of the glottic polyps.  SURGEON:  Onnie Graham, MD  ANESTHESIA:  General, endotracheal anesthesia.  COMPLICATIONS:  None.  INDICATION:  The patient is a 68 year old female who has a several year history of laryngeal polyps with associated hoarseness.  More recently, in the last few couple of months, she has had worsening trouble breathing and was found by fiberoptic exam to have extensive polyps of the glottis that were obstructing the airway.  She presents to the operating room for surgical management.  FINDINGS:  The largest polyp extending from the under surface of the right vocal fold and was a ball-valve polyp filling the glottis.  There was a large, billowy polyp on the superior surface of the left vocal fold.  Both of these polyps were removed with CO2 laser.  The posterior left vocal fold had a granular area on the superior surface that was biopsied as a separate specimen.  DESCRIPTION OF PROCEDURE:  The patient was identified in the holding room, informed consent having been obtained after discussion of risks, benefits, alternatives, the patient was brought to the operative suite and put on the table in supine position.  The bed was turned 90 degrees from anesthesia and anesthesia was induced.  She was in ease mask airway and was laid flat.  The eyes taped closed and tooth guard placed over the upper  teeth.  A Miller laryngoscope was then used to expose the larynx and a #5 laser safe endotracheal tube was then passed through the polyps into the upper trachea.  The cuff was inflated and the patient was successfully ventilated.  The tube was taped to the left side.  Damp eye pads were placed over the eyes and taped in place and a damp towel was placed over the face.  This was done after first exposing the larynx with a large Storz laser laryngoscope that was placed in suspension on the Mayo stand using a Lewy arm.  A 0-degree telescope was used to make a photograph of the larynx.  The large polyp was pulled through the glottis superiorly and another photograph was made.  The large polyp was grasped with a cup forceps and the CO2 laser on a setting of 8 watts was then used to make an incision along the natural vocal fold and contour of removing the polyp.  This passed to nursing for pathology.  This allowed the glottic opening to be examined.  The left side superior surface polyp was then grasped with cup forceps and removed with same CO2 laser setting.  This was sent for pathology as well.  Some remaining right-sided polyp was  still at the anterior commissure and was removed further with cup forceps and laser.  At this point, some attempts were made to try to suture the left-sided mucosal edge laterally to the other edge using 6-0 plain gut.  However, the suture did not hold in the mucosa well and this was abandoned.  At this point, the airway was suctioned.  The cuff from endotracheal tube was deflated and the tube was removed with the laryngoscope still in the glottic position.  Jet Venturi ventilation was then initiated.  The airway was then carefully examined and no additional obstructing polyps were seen. Thus, a 6-0 regular endotracheal tube was passed through the laser laryngoscope and held in place while laryngoscope was taken out of suspension and removed from the patient's  mouth.  The tube was then attached to the anesthesia circuit and patient was ventilated successfully.  The patient returned to Anesthesia for wake-up, was extubated and moved to recovery room in stable condition.     Onnie Graham, MD     DDB/MEDQ  D:  06/05/2013  T:  06/06/2013  Job:  [id number]

## 2013-06-06 NOTE — Progress Notes (Signed)
Pt discharged from facility free of pain/discomfort. Pt is a/o, left with all of her listed belongings which she verified as such. Pt was taken home by her daughter. Discharge instructions and Rx were discussed and given to pt. She is aware of her follow-up appointments and her medication list. She was escorted out of the hospital accompanied by the NT via [place], wearing street clothing.

## 2013-06-06 NOTE — Discharge Summary (Signed)
Physician Discharge Summary  Patient ID: [name] MRN: [id number] DOB/AGE: 1945/10/10 68 y.o.  Admit date: 06/05/2013 Discharge date: 06/06/2013  Admission Diagnoses: Laryngeal polyps, stridor, hoarseness  Discharge Diagnoses:  Active Problems:   Larynx polyp   Discharged Condition: good  Hospital Course: 68 year old female with severe laryngeal polyps causing hoarseness and stridor presented to the OR for SMDL with CO2 laser excision of polyps.  See operative note.  She was observed overnight in step down unit and did well with no difficulty breathing.  Tolerating po's.  Slept well.  Little pain this morning.  Felt stable for discharge.  Consults: None  Significant Diagnostic Studies: None  Treatments: surgery: SMDL with CO2 laser excision of laryngeal polyps  Discharge Exam: Blood pressure 136/74, pulse 86, temperature 98 F (36.7 C), temperature source Axillary, resp. rate 17, height 5\' 2"  (1.575 m), weight 43.4 kg (95 lb 10.9 oz), SpO2 98.00%. General appearance: alert, cooperative and no distress Throat: no stridor, whisper voice  Disposition: 01-Home or Self Care  Discharge Orders   Future Appointments Provider Department Dept Phone   06/09/2013 3:15 PM [place], [place] [facility]   06/17/2013 2:15 PM Rigoberto Noel, MD [facility] [phone]   08/18/2013 2:30 PM Unk Pinto, MD [facility] [phone]   11/19/2013 2:30 PM Unk Pinto, MD [facility] [phone]   04/29/2014 3:00 PM Unk Pinto, MD [facility] [phone]   Future Orders Complete By Expires   Diet - low sodium heart healthy  As directed    Discharge instructions  As directed    Comments:     Resume regular diet as able.  No talking until Monday and then use gentle voice only.  Call Dr. Redmond Baseman or 911 immediately with difficulty breathing.   Increase activity slowly  As directed        Medication List         Aclidinium Bromide 400 MCG/ACT Aepb  Commonly known as:  TUDORZA PRESSAIR  Inhale 400 mcg into the lungs 2 (two) times daily.     albuterol 108 (90 BASE) MCG/ACT inhaler  Commonly known as:  PROVENTIL HFA;VENTOLIN HFA  Inhale 2 puffs into the lungs every 6 (six) hours as needed for shortness of breath.     albuterol (2.5 MG/3ML) 0.083% nebulizer solution  Commonly known as:  PROVENTIL  Take 2.5 mg by nebulization every 6 (six) hours as needed.     aspirin EC 81 MG tablet  Take 81 mg by mouth every other day.     CARTIA XT 240 MG 24 hr capsule  Generic drug:  diltiazem  Take 240 mg by mouth daily.     Fluticasone-Salmeterol 250-50 MCG/DOSE Aepb  Commonly known as:  ADVAIR DISKUS  Inhale 1 puff into the lungs 2 (two) times daily.     HYDROcodone-acetaminophen 5-325 MG per tablet  Commonly known as:  NORCO/VICODIN  Take 1-2 tablets by mouth every 6 (six) hours as needed for moderate pain.     LORazepam 0.5 MG tablet  Commonly known as:  ATIVAN  Take 0.5 mg by mouth daily. For anxiety     Magnesium 250 MG Tabs  Take 250 mg by mouth daily.     mirtazapine 45 MG tablet  Commonly known as:  REMERON  Take 1 tablet (45 mg total) by mouth at bedtime.     montelukast 10 MG tablet  Commonly known  as:  SINGULAIR  Take 10 mg by mouth daily.     omeprazole 40 MG capsule  Commonly known as:  PRILOSEC  Take 1 capsule (40 mg total) by mouth 2 (two) times daily.     oxybutynin 5 MG tablet  Commonly known as:  DITROPAN  Take 5 mg by mouth daily.     QUEtiapine 400 MG tablet  Commonly known as:  SEROQUEL  Take 400 mg by mouth at bedtime.     quinapril 40 MG tablet  Commonly known as:  ACCUPRIL  Take 40 mg by mouth daily.     traMADol 50 MG tablet  Commonly known as:  ULTRAM  Take 50 mg by mouth 3 (three) times daily as needed for moderate pain. For pain     vitamin B-12 1000 MCG tablet  Commonly  known as:  CYANOCOBALAMIN  Take 1,000 mcg by mouth daily.           Follow-up Information   Follow up with Maryl Blalock, MD. Schedule an appointment as soon as possible for a visit in 2 weeks.   Specialty:  Otolaryngology   Contact information:   [address] [place] [address] [phone]       Signed: Melida Quitter 06/06/2013, 10:44 AM

## 2013-06-06 NOTE — Progress Notes (Signed)
Utilization review completed.  P.J. Hadassah Rana,RN,BSN Case Manager [phone]  

## 2013-06-08 NOTE — Addendum Note (Signed)
Addended by: Parke Poisson E on: 06/08/2013 12:41 PM   Modules accepted: Orders, Medications

## 2013-06-09 ENCOUNTER — Ambulatory Visit (INDEPENDENT_AMBULATORY_CARE_PROVIDER_SITE_OTHER): Payer: Medicare Other | Admitting: Podiatry

## 2013-06-09 ENCOUNTER — Encounter (HOSPITAL_COMMUNITY): Payer: Self-pay | Admitting: Otolaryngology

## 2013-06-09 VITALS — BP 104/59 | HR 97 | Resp 18

## 2013-06-09 DIAGNOSIS — M2040 Other hammer toe(s) (acquired), unspecified foot: Secondary | ICD-10-CM

## 2013-06-09 NOTE — Progress Notes (Signed)
She presents today chief complaint of a painful lesion to the plantar lateral aspect of the fifth digit of the right foot. She also states that she had 2 large tumors removed from her tear next.  Objective: Vital signs are stable she is alert and oriented x3 pulses are palpable bilateral. Reactive hyperkeratosis to the plantar lateral aspect fifth digit of the right foot painful in nature.  Assessment: Hammertoe deformity fifth right with reactive hyperkeratosis and exostosis.  Plan: Debridement reactive hyperkeratosis followup with her in 3 weeks. Remember to ask her about her diagnosis

## 2013-06-12 ENCOUNTER — Telehealth: Payer: Self-pay | Admitting: Adult Health

## 2013-06-12 MED ORDER — FLUTICASONE-SALMETEROL 250-50 MCG/DOSE IN AEPB: 1.0000 | INHALATION_SPRAY | Freq: Two times a day (BID) | RESPIRATORY_TRACT | Status: DC

## 2013-06-12 MED ORDER — ACLIDINIUM BROMIDE 400 MCG/ACT IN AEPB: 400.0000 ug | INHALATION_SPRAY | Freq: Two times a day (BID) | RESPIRATORY_TRACT | Status: DC

## 2013-06-12 NOTE — Telephone Encounter (Signed)
Called and spoke w/ pt. She needed a refill on tudorza and advair. i have sent this to the pharmacy. Nothing further needed

## 2013-06-15 NOTE — Progress Notes (Signed)
Reviewed & agree with plan  

## 2013-06-16 ENCOUNTER — Encounter: Payer: Self-pay | Admitting: Physician Assistant

## 2013-06-16 ENCOUNTER — Ambulatory Visit (INDEPENDENT_AMBULATORY_CARE_PROVIDER_SITE_OTHER): Payer: Medicare Other | Admitting: Physician Assistant

## 2013-06-16 VITALS — BP 110/62 | HR 80 | Temp 98.6°F | Resp 16 | Wt 96.0 lb

## 2013-06-16 DIAGNOSIS — R42 Dizziness and giddiness: Secondary | ICD-10-CM

## 2013-06-16 DIAGNOSIS — E039 Hypothyroidism, unspecified: Secondary | ICD-10-CM

## 2013-06-16 DIAGNOSIS — I10 Essential (primary) hypertension: Secondary | ICD-10-CM | POA: Diagnosis not present

## 2013-06-16 DIAGNOSIS — K219 Gastro-esophageal reflux disease without esophagitis: Secondary | ICD-10-CM

## 2013-06-16 DIAGNOSIS — N3000 Acute cystitis without hematuria: Secondary | ICD-10-CM | POA: Diagnosis not present

## 2013-06-16 LAB — HEPATIC FUNCTION PANEL
ALT: 14 U/L (ref 0–35)
AST: 21 U/L (ref 0–37)
Albumin: 4 g/dL (ref 3.5–5.2)
Alkaline Phosphatase: 58 U/L (ref 39–117)
BILIRUBIN DIRECT: 0.1 mg/dL (ref 0.0–0.3)
BILIRUBIN TOTAL: 0.3 mg/dL (ref 0.2–1.2)
Indirect Bilirubin: 0.2 mg/dL (ref 0.2–1.2)
Total Protein: 6.2 g/dL (ref 6.0–8.3)

## 2013-06-16 LAB — CBC WITH DIFFERENTIAL/PLATELET
BASOS PCT: 1 % (ref 0–1)
Basophils Absolute: 0.1 10*3/uL (ref 0.0–0.1)
Eosinophils Absolute: 0.2 10*3/uL (ref 0.0–0.7)
Eosinophils Relative: 2 % (ref 0–5)
HCT: 36.7 % (ref 36.0–46.0)
Hemoglobin: 12.4 g/dL (ref 12.0–15.0)
LYMPHS ABS: 2.1 10*3/uL (ref 0.7–4.0)
Lymphocytes Relative: 23 % (ref 12–46)
MCH: 30.5 pg (ref 26.0–34.0)
MCHC: 33.8 g/dL (ref 30.0–36.0)
MCV: 90.4 fL (ref 78.0–100.0)
Monocytes Absolute: 1.1 10*3/uL — ABNORMAL HIGH (ref 0.1–1.0)
Monocytes Relative: 12 % (ref 3–12)
NEUTROS PCT: 62 % (ref 43–77)
Neutro Abs: 5.8 10*3/uL (ref 1.7–7.7)
PLATELETS: 330 10*3/uL (ref 150–400)
RBC: 4.06 MIL/uL (ref 3.87–5.11)
RDW: 13.8 % (ref 11.5–15.5)
WBC: 9.3 10*3/uL (ref 4.0–10.5)

## 2013-06-16 LAB — BASIC METABOLIC PANEL WITH GFR
BUN: 14 mg/dL (ref 6–23)
CALCIUM: 9.1 mg/dL (ref 8.4–10.5)
CO2: 27 meq/L (ref 19–32)
Chloride: 100 mEq/L (ref 96–112)
Creat: 0.99 mg/dL (ref 0.50–1.10)
GFR, EST AFRICAN AMERICAN: 67 mL/min
GFR, Est Non African American: 58 mL/min — ABNORMAL LOW
Glucose, Bld: 99 mg/dL (ref 70–99)
Potassium: 4.5 mEq/L (ref 3.5–5.3)
SODIUM: 133 meq/L — AB (ref 135–145)

## 2013-06-16 LAB — TSH: TSH: 1.469 u[IU]/mL (ref 0.350–4.500)

## 2013-06-16 MED ORDER — PREDNISONE 20 MG PO TABS: ORAL_TABLET | ORAL | Status: DC

## 2013-06-16 MED ORDER — LORAZEPAM 0.5 MG PO TABS: 0.5000 mg | ORAL_TABLET | Freq: Every day | ORAL | Status: DC

## 2013-06-16 MED ORDER — DILTIAZEM HCL ER COATED BEADS 240 MG PO CP24: 240.0000 mg | ORAL_CAPSULE | Freq: Every day | ORAL | Status: DC

## 2013-06-16 MED ORDER — OMEPRAZOLE 40 MG PO CPDR
40.0000 mg | DELAYED_RELEASE_CAPSULE | Freq: Two times a day (BID) | ORAL | Status: DC
Start: 2013-06-16 — End: 2013-07-22

## 2013-06-16 NOTE — Patient Instructions (Signed)
Vertigo  Vertigo means you feel like you are moving when you are not. Vertigo can make you feel like things around you are moving when they are not. This problem often goes away on its own.   HOME CARE   · Follow your doctor's instructions.  · Avoid driving.  · Avoid using heavy machinery.  · Avoid doing any activity that could be dangerous if you have a vertigo attack.  · Tell your doctor if a medicine seems to cause your vertigo.  GET HELP RIGHT AWAY IF:   · Your medicines do not help or make you feel worse.  · You have trouble talking or walking.  · You feel weak or have trouble using your arms, hands, or legs.  · You have bad headaches.  · You keep feeling sick to your stomach (nauseous) or throwing up (vomiting).  · Your vision changes.  · A family member notices changes in your behavior.  · Your problems get worse.  MAKE SURE YOU:  · Understand these instructions.  · Will watch your condition.  · Will get help right away if you are not doing well or get worse.  Document Released: 12/20/2007 Document Revised: 06/04/2011 Document Reviewed: 09/28/2010  Exit[employer]® Patient Information ©2014 [employer].

## 2013-06-16 NOTE — Progress Notes (Signed)
   Subjective:    Patient ID: [name], female    DOB: Sep 05, 1945, 68 y.o.   MRN: [id number]  HPI 68 y.o. female with history of COPD, smoker, CAD, prediabetes, s/p larynx polyp removal on the 13th. States she has been dizzy, shaky, and has fallen twice both times with walking. One time she states she stood up from the couch, was walking around a corner and fell, no LOC. She states she has had some blurry vision the last 3 days but denies AF, double vision. She has had it for one week, states it is around time for her to eat, She states that a long time ago Dr. Melford Aase gave her prednisone for sores in her mouth that has helped, she has them now, magic mouth wash has not helped.   Patient was on quinapril but this was stopped due to possible cough and she was started on valsartan which she claims caused decrease appetitie.   Review of Systems  Constitutional: Negative.   HENT: Positive for mouth sores and voice change. Negative for drooling, ear discharge, ear pain, sneezing, sore throat, tinnitus and trouble swallowing.   Eyes: Negative.   Respiratory: Positive for cough. Negative for chest tightness, shortness of breath and wheezing.   Cardiovascular: Negative.   Gastrointestinal: Negative.   Genitourinary: Negative.   Neurological: Positive for dizziness. Negative for tremors, seizures, syncope, facial asymmetry, speech difficulty, weakness, light-headedness, numbness and headaches.  Hematological: Negative.   Psychiatric/Behavioral: Negative.        Objective:   Physical Exam  Constitutional: She is oriented to person, place, and time. She appears cachectic.  HENT:  Right Ear: Hearing normal. Tympanic membrane is bulging. A middle ear effusion is present.  Left Ear: Tympanic membrane is bulging. A middle ear effusion is present.  Eyes: Conjunctivae and EOM are normal. Pupils are equal, round, and reactive to light.  Neck: Normal range of motion. Neck supple.  Cardiovascular:  Normal rate and regular rhythm.   Pulmonary/Chest: Effort normal. She has wheezes.  Abdominal: Soft. Bowel sounds are normal. She exhibits no distension. There is no tenderness.  Musculoskeletal: Normal range of motion.  Lymphadenopathy:    She has no cervical adenopathy.  Neurological: She is alert and oriented to person, place, and time. No cranial nerve deficit.  Skin: Skin is warm and dry.  Right arm from fall- two scrapes, areas were cleaned with peroxide and redressed. Some erythema, tenderness.  The one proximal had some minor discharge but no warmth.       Assessment & Plan:  Dizziness- Normal neuro exam- suggest seeing eye doctor- ? Infection/dehydration, hypotension- do not restart valsartan- check CBC, BMP, LFTS, UA/C&S- if worse go to ER- especially any weakness, changes in vision/speech.   Mouth ulcers- prednisone 20mg  # 10 taper  Arm wounds- replaced bandages, no bony tenderness  OVER 30 minutes of exam, counseling, chart review, referral performed

## 2013-06-17 ENCOUNTER — Encounter: Payer: Self-pay | Admitting: Pulmonary Disease

## 2013-06-17 ENCOUNTER — Ambulatory Visit (INDEPENDENT_AMBULATORY_CARE_PROVIDER_SITE_OTHER): Payer: Medicare Other | Admitting: Pulmonary Disease

## 2013-06-17 ENCOUNTER — Encounter (INDEPENDENT_AMBULATORY_CARE_PROVIDER_SITE_OTHER): Payer: Self-pay

## 2013-06-17 VITALS — BP 134/58 | HR 87 | Temp 97.2°F | Ht 62.0 in | Wt 95.2 lb

## 2013-06-17 DIAGNOSIS — J449 Chronic obstructive pulmonary disease, unspecified: Secondary | ICD-10-CM | POA: Diagnosis not present

## 2013-06-17 DIAGNOSIS — J383 Other diseases of vocal cords: Secondary | ICD-10-CM

## 2013-06-17 LAB — URINALYSIS, ROUTINE W REFLEX MICROSCOPIC
Bilirubin Urine: NEGATIVE
Glucose, UA: NEGATIVE mg/dL
Hgb urine dipstick: NEGATIVE
Ketones, ur: NEGATIVE mg/dL
LEUKOCYTES UA: NEGATIVE
NITRITE: NEGATIVE
Protein, ur: NEGATIVE mg/dL
SPECIFIC GRAVITY, URINE: 1.014 (ref 1.005–1.030)
Urobilinogen, UA: 0.2 mg/dL (ref 0.0–1.0)
pH: 5.5 (ref 5.0–8.0)

## 2013-06-17 LAB — URINE CULTURE
Colony Count: NO GROWTH
Organism ID, Bacteria: NO GROWTH

## 2013-06-17 NOTE — Patient Instructions (Signed)
Congratulations on cutting down cigarettes - I am sure that you can QUIT STOP spiriva for a week, until mouth sores better Can try respimat in the future Refills on dulera 100 - Rinse mouth after use

## 2013-06-17 NOTE — Progress Notes (Signed)
   Subjective:    Patient ID: [name], female    DOB: 11/29/1945, 68 y.o.   MRN: [id number]  HPI  41 yowf active smoker with hoarseness all her life with fob 04/24/12 with VC polypsand GOLD III COPD documented 03/28/12 referred by Dr Lawanna Kobus 05/09/2013 to pulmonary clinic for new pattern sob.   06/17/2013 She depends on samples for her inhalers. She says that she will not be on take any inhalers because they cost too much money. We  discussed changing over to nebulizers. However, she says she does not want to do this.  Patient was seen last visit for worsening shortness, of breath and cough. Patient was taken off of her ACE inhibitor. Started on dulera & spiriva last visit - not sure she is able to use handihaler Pt was seen in 2014 with FOB and Vocal cord polyps.   She underwent polypectomy - She continues to have chronic daily Hoarseness.  Patient says that she was unable to remain off. Her inhalers and has restarted on Advair and Tudorza.  She denies any hemoptysis, orthopnea, PND, leg swelling, or fever.  Smokes 2 cigs/d C/o mouth sores Valsartan caused jitters, loss of appetite  Review of Systems neg for any significant sore throat, dysphagia, itching, sneezing, nasal congestion or excess/ purulent secretions, fever, chills, sweats, unintended wt loss, pleuritic or exertional cp, hempoptysis, orthopnea pnd or change in chronic leg swelling. Also denies presyncope, palpitations, heartburn, abdominal pain, nausea, vomiting, diarrhea or change in bowel or urinary habits, dysuria,hematuria, rash, arthralgias, visual complaints, headache, numbness weakness or ataxia.     Objective:   Physical Exam  Gen. Pleasant, thin woman, in no distress ENT - no lesions, no post nasal drip Neck: No JVD, no thyromegaly, no carotid bruits Lungs: no use of accessory muscles, no dullness to percussion, clear without rales or rhonchi  Cardiovascular: Rhythm regular, heart sounds  normal, no murmurs or  gallops, no peripheral edema Musculoskeletal: No deformities, no cyanosis or clubbing         Assessment & Plan:

## 2013-06-17 NOTE — Assessment & Plan Note (Addendum)
Congratulations on cutting down cigarettes - I am sure that you can QUIT STOP spiriva for a week, until mouth sores better Can try respimat in the future, doubt she  Can use handihaler well Refills on dulera 100 - Rinse mouth after use

## 2013-06-17 NOTE — Assessment & Plan Note (Signed)
Expect improvement post polypectomy - benign lesions

## 2013-06-18 DIAGNOSIS — J381 Polyp of vocal cord and larynx: Secondary | ICD-10-CM | POA: Diagnosis not present

## 2013-06-18 DIAGNOSIS — R490 Dysphonia: Secondary | ICD-10-CM | POA: Diagnosis not present

## 2013-06-19 ENCOUNTER — Telehealth: Payer: Self-pay | Admitting: Pulmonary Disease

## 2013-06-19 MED ORDER — MOMETASONE FURO-FORMOTEROL FUM 100-5 MCG/ACT IN AERO: 2.0000 | INHALATION_SPRAY | Freq: Two times a day (BID) | RESPIRATORY_TRACT | Status: DC

## 2013-06-19 NOTE — Telephone Encounter (Signed)
Spoke with pt. She reports she needs her dulera sent to the pharmacy.  Also she wants to know if nicotine patches were called in. I advised her this is OTC. She voiced her understanding. RX called in

## 2013-06-22 ENCOUNTER — Other Ambulatory Visit: Payer: Self-pay | Admitting: Internal Medicine

## 2013-06-23 ENCOUNTER — Ambulatory Visit (INDEPENDENT_AMBULATORY_CARE_PROVIDER_SITE_OTHER): Payer: Medicare Other | Admitting: Podiatry

## 2013-06-23 ENCOUNTER — Encounter: Payer: Self-pay | Admitting: Podiatry

## 2013-06-23 VITALS — BP 106/62 | HR 98 | Resp 18

## 2013-06-23 DIAGNOSIS — B351 Tinea unguium: Secondary | ICD-10-CM | POA: Diagnosis not present

## 2013-06-23 DIAGNOSIS — L84 Corns and callosities: Secondary | ICD-10-CM

## 2013-06-23 DIAGNOSIS — M79609 Pain in unspecified limb: Secondary | ICD-10-CM | POA: Diagnosis not present

## 2013-06-23 NOTE — Progress Notes (Signed)
She presents today with a chief complaint of painful toenails one through 5 bilaterally. She's also complaining of a painful callus to the fifth digit of the right foot.  Objective: Pulses are barely palpable, capillary fill time to digits one through 5 is immediate. Nails are thick yellow dystrophic onychomycotic and painful palpation.  Assessment: Pain in limb secondary to onychomycosis and porokeratotic lesion.  Plan: Debridement of nails 1 through 5 bilaterally debridement of reactive hyperkeratosis fifth right

## 2013-06-24 ENCOUNTER — Telehealth: Payer: Self-pay

## 2013-06-24 NOTE — Telephone Encounter (Signed)
Received hand written note from front office staff, patient called to advise that she still has mouth sores and she is unable to sleep, per Vicie Mutters, PA returned call to patient and advised her to see her dentist and add a sublingual vitamin B12  regarding the mouth sore issues, regarding her insomnia issues Estill Bamberg recommends that she try taking 2 of her Ativan 0.5 mg tablets instead of 1 at bedtime. Patient verbalized understanding.

## 2013-06-25 ENCOUNTER — Other Ambulatory Visit: Payer: Self-pay | Admitting: Physician Assistant

## 2013-06-26 ENCOUNTER — Telehealth: Payer: Self-pay | Admitting: Internal Medicine

## 2013-06-26 NOTE — Telephone Encounter (Signed)
Spoke with patient and advised her that there is no salt tablet to take, also advised it is not 2 tablespoons it is 2 Teaspoons, she verbalized understanding, she will do 2 teaspoon of salt substitute daily

## 2013-06-26 NOTE — Telephone Encounter (Signed)
PATIENT WOULD LIKE TO KNOW IF INSTEAD OF 2 TABLESPOONS OF SALT INTAKE, IF SHE COULD HAVE A SALT TABLET? PLEASE ADVISE PT  THANKS, [name]

## 2013-06-30 ENCOUNTER — Other Ambulatory Visit: Payer: Self-pay | Admitting: Internal Medicine

## 2013-06-30 ENCOUNTER — Telehealth: Payer: Self-pay

## 2013-06-30 ENCOUNTER — Ambulatory Visit: Payer: Medicare Other | Admitting: Podiatry

## 2013-06-30 DIAGNOSIS — F419 Anxiety disorder, unspecified: Secondary | ICD-10-CM

## 2013-06-30 DIAGNOSIS — R42 Dizziness and giddiness: Secondary | ICD-10-CM

## 2013-06-30 DIAGNOSIS — F323 Major depressive disorder, single episode, severe with psychotic features: Secondary | ICD-10-CM

## 2013-06-30 DIAGNOSIS — F325 Major depressive disorder, single episode, in full remission: Secondary | ICD-10-CM | POA: Insufficient documentation

## 2013-06-30 DIAGNOSIS — M1990 Unspecified osteoarthritis, unspecified site: Secondary | ICD-10-CM

## 2013-06-30 MED ORDER — QUETIAPINE FUMARATE 400 MG PO TABS: 400.0000 mg | ORAL_TABLET | Freq: Every day | ORAL | Status: DC

## 2013-06-30 MED ORDER — LORAZEPAM 0.5 MG PO TABS
ORAL_TABLET | ORAL | Status: DC
Start: 2013-06-30 — End: 2013-07-01

## 2013-06-30 MED ORDER — TRAMADOL HCL 50 MG PO TABS: 50.0000 mg | ORAL_TABLET | Freq: Three times a day (TID) | ORAL | Status: DC

## 2013-06-30 NOTE — Telephone Encounter (Signed)
Pt called c/o dizziness. Per MS, pt needs to be taken to ER. Pt aware.

## 2013-07-01 ENCOUNTER — Emergency Department (HOSPITAL_COMMUNITY): Payer: Medicare Other

## 2013-07-01 ENCOUNTER — Inpatient Hospital Stay (HOSPITAL_COMMUNITY)
Admission: EM | Admit: 2013-07-01 | Discharge: 2013-07-03 | DRG: 641 | Disposition: A | Discharge status: Home / Self Care | Payer: Medicare Other | Attending: Internal Medicine | Admitting: Internal Medicine

## 2013-07-01 ENCOUNTER — Inpatient Hospital Stay (HOSPITAL_COMMUNITY): Payer: Medicare Other

## 2013-07-01 ENCOUNTER — Encounter (HOSPITAL_COMMUNITY): Payer: Self-pay | Admitting: Emergency Medicine

## 2013-07-01 DIAGNOSIS — N183 Chronic kidney disease, stage 3 unspecified: Secondary | ICD-10-CM | POA: Diagnosis present

## 2013-07-01 DIAGNOSIS — Z8673 Personal history of transient ischemic attack (TIA), and cerebral infarction without residual deficits: Secondary | ICD-10-CM | POA: Diagnosis not present

## 2013-07-01 DIAGNOSIS — R5383 Other fatigue: Secondary | ICD-10-CM | POA: Diagnosis not present

## 2013-07-01 DIAGNOSIS — F325 Major depressive disorder, single episode, in full remission: Secondary | ICD-10-CM | POA: Diagnosis present

## 2013-07-01 DIAGNOSIS — IMO0002 Reserved for concepts with insufficient information to code with codable children: Secondary | ICD-10-CM | POA: Diagnosis not present

## 2013-07-01 DIAGNOSIS — Z7982 Long term (current) use of aspirin: Secondary | ICD-10-CM | POA: Diagnosis not present

## 2013-07-01 DIAGNOSIS — F3289 Other specified depressive episodes: Secondary | ICD-10-CM | POA: Diagnosis present

## 2013-07-01 DIAGNOSIS — R110 Nausea: Secondary | ICD-10-CM | POA: Diagnosis not present

## 2013-07-01 DIAGNOSIS — E860 Dehydration: Secondary | ICD-10-CM | POA: Diagnosis not present

## 2013-07-01 DIAGNOSIS — N189 Chronic kidney disease, unspecified: Secondary | ICD-10-CM

## 2013-07-01 DIAGNOSIS — R29898 Other symptoms and signs involving the musculoskeletal system: Secondary | ICD-10-CM | POA: Diagnosis not present

## 2013-07-01 DIAGNOSIS — W19XXXA Unspecified fall, initial encounter: Secondary | ICD-10-CM | POA: Diagnosis present

## 2013-07-01 DIAGNOSIS — D638 Anemia in other chronic diseases classified elsewhere: Secondary | ICD-10-CM | POA: Diagnosis present

## 2013-07-01 DIAGNOSIS — K219 Gastro-esophageal reflux disease without esophagitis: Secondary | ICD-10-CM | POA: Diagnosis present

## 2013-07-01 DIAGNOSIS — N179 Acute kidney failure, unspecified: Secondary | ICD-10-CM | POA: Diagnosis present

## 2013-07-01 DIAGNOSIS — F17200 Nicotine dependence, unspecified, uncomplicated: Secondary | ICD-10-CM | POA: Diagnosis present

## 2013-07-01 DIAGNOSIS — J4489 Other specified chronic obstructive pulmonary disease: Secondary | ICD-10-CM | POA: Diagnosis present

## 2013-07-01 DIAGNOSIS — F329 Major depressive disorder, single episode, unspecified: Secondary | ICD-10-CM | POA: Diagnosis present

## 2013-07-01 DIAGNOSIS — J449 Chronic obstructive pulmonary disease, unspecified: Secondary | ICD-10-CM | POA: Diagnosis present

## 2013-07-01 DIAGNOSIS — I129 Hypertensive chronic kidney disease with stage 1 through stage 4 chronic kidney disease, or unspecified chronic kidney disease: Secondary | ICD-10-CM | POA: Diagnosis present

## 2013-07-01 DIAGNOSIS — I959 Hypotension, unspecified: Secondary | ICD-10-CM | POA: Diagnosis present

## 2013-07-01 DIAGNOSIS — Y92009 Unspecified place in unspecified non-institutional (private) residence as the place of occurrence of the external cause: Secondary | ICD-10-CM

## 2013-07-01 DIAGNOSIS — S0990XA Unspecified injury of head, initial encounter: Secondary | ICD-10-CM | POA: Diagnosis not present

## 2013-07-01 DIAGNOSIS — E871 Hypo-osmolality and hyponatremia: Principal | ICD-10-CM | POA: Diagnosis present

## 2013-07-01 DIAGNOSIS — R5381 Other malaise: Secondary | ICD-10-CM | POA: Diagnosis not present

## 2013-07-01 DIAGNOSIS — I369 Nonrheumatic tricuspid valve disorder, unspecified: Secondary | ICD-10-CM | POA: Diagnosis not present

## 2013-07-01 DIAGNOSIS — R404 Transient alteration of awareness: Secondary | ICD-10-CM | POA: Diagnosis not present

## 2013-07-01 DIAGNOSIS — I10 Essential (primary) hypertension: Secondary | ICD-10-CM | POA: Diagnosis present

## 2013-07-01 LAB — CBC WITH DIFFERENTIAL/PLATELET
BASOS PCT: 0 % (ref 0–1)
Basophils Absolute: 0 10*3/uL (ref 0.0–0.1)
EOS ABS: 0.1 10*3/uL (ref 0.0–0.7)
Eosinophils Relative: 1 % (ref 0–5)
HCT: 31.6 % — ABNORMAL LOW (ref 36.0–46.0)
Hemoglobin: 11.3 g/dL — ABNORMAL LOW (ref 12.0–15.0)
Lymphocytes Relative: 17 % (ref 12–46)
Lymphs Abs: 1.7 10*3/uL (ref 0.7–4.0)
MCH: 31.7 pg (ref 26.0–34.0)
MCHC: 35.8 g/dL (ref 30.0–36.0)
MCV: 88.5 fL (ref 78.0–100.0)
MONOS PCT: 16 % — AB (ref 3–12)
Monocytes Absolute: 1.6 10*3/uL — ABNORMAL HIGH (ref 0.1–1.0)
Neutro Abs: 6.2 10*3/uL (ref 1.7–7.7)
Neutrophils Relative %: 66 % (ref 43–77)
PLATELETS: 232 10*3/uL (ref 150–400)
RBC: 3.57 MIL/uL — ABNORMAL LOW (ref 3.87–5.11)
RDW: 13.5 % (ref 11.5–15.5)
WBC: 9.6 10*3/uL (ref 4.0–10.5)

## 2013-07-01 LAB — COMPREHENSIVE METABOLIC PANEL
ALBUMIN: 3.1 g/dL — AB (ref 3.5–5.2)
ALT: 13 U/L (ref 0–35)
AST: 21 U/L (ref 0–37)
Alkaline Phosphatase: 71 U/L (ref 39–117)
BUN: 25 mg/dL — AB (ref 6–23)
CALCIUM: 8.4 mg/dL (ref 8.4–10.5)
CO2: 23 mEq/L (ref 19–32)
Chloride: 90 mEq/L — ABNORMAL LOW (ref 96–112)
Creatinine, Ser: 1.55 mg/dL — ABNORMAL HIGH (ref 0.50–1.10)
GFR calc Af Amer: 38 mL/min — ABNORMAL LOW (ref >90)
GFR calc non Af Amer: 33 mL/min — ABNORMAL LOW (ref >90)
Glucose, Bld: 104 mg/dL — ABNORMAL HIGH (ref 70–99)
POTASSIUM: 4.7 meq/L (ref 3.7–5.3)
Sodium: 126 mEq/L — ABNORMAL LOW (ref 137–147)
TOTAL PROTEIN: 5.7 g/dL — AB (ref 6.0–8.3)
Total Bilirubin: <0.2 mg/dL — ABNORMAL LOW (ref 0.3–1.2)

## 2013-07-01 LAB — I-STAT CG4 LACTIC ACID, ED: Lactic Acid, Venous: 0.53 mmol/L (ref 0.5–2.2)

## 2013-07-01 LAB — TROPONIN I: Troponin I: <0.3 ng/mL (ref <0.30)

## 2013-07-01 LAB — CK: CK TOTAL: 97 U/L (ref 7–177)

## 2013-07-01 MED ORDER — SODIUM CHLORIDE 0.9 % IV SOLN
INTRAVENOUS | Status: DC
  Administered 2013-07-01: 19:00:00 via INTRAVENOUS

## 2013-07-01 NOTE — ED Provider Notes (Signed)
CSN: [id number]     Arrival date & time 07/01/13  1821 History   First MD Initiated Contact with Patient 07/01/13 1826     Chief Complaint  Patient presents with  . Hypotension     (Consider location/radiation/quality/duration/timing/severity/associated sxs/prior Treatment) The history is provided by the patient and the EMS personnel.   patient here complaining of bilateral lower extremity weakness that came on suddenly when she was outside today. Patient states that her legs suddenly gave out on her and she fell to the ground. Did not strike her head no loss of consciousness. Denies any chest pain or chest pressure. No dyspnea. No diaphoresis. Denies any recent illnesses. No black or bloody stools. No fever or chills. Called EMS and blood pressure at the scene was 70 over palp. Given IV fluids and blood pressure improved and she was transported here. Denies any new medication changes. Feels better.  Past Medical History  Diagnosis Date  . Asthma   . Hypertension   . GERD (gastroesophageal reflux disease)   . Stroke     was on Plavix - not now  . Carotid artery aneurysm 03/04/2012  . DJD (degenerative joint disease)   . COPD (chronic obstructive pulmonary disease)   . Depression   . Shortness of breath    Past Surgical History  Procedure Laterality Date  . Video bronchoscopy  04/24/2012    Procedure: VIDEO BRONCHOSCOPY WITHOUT FLUORO;  Surgeon: Rigoberto Noel, MD;  Location: [facility];  Service: Cardiopulmonary;  Laterality: Bilateral;  . Tubal ligation  30 years  . Tonsillectomy    . Colonoscopy  2004  . Hernia repair Right 2014    inguinal  . Cataract extraction Bilateral   . Microlaryngoscopy with co2 laser and excision of vocal cord lesion N/A 06/05/2013    Procedure: MICROLARYNGOSCOPY WITH CO2 LASER AND EXCISION OF VOCAL CORD POLYPS;  Surgeon: Melida Quitter, MD;  Location: [facility] OR;  Service: ENT;  Laterality: N/A;   Family History  Problem Relation Age of Onset  . Breast  cancer Mother   . Breast cancer Sister    History  Substance Use Topics  . Smoking status: Current Every Day Smoker -- 0.25 packs/day for 30 years    Types: Cigarettes  . Smokeless tobacco: Never Used     Comment: pt smokes 2 cigs daily 06/17/13  . Alcohol Use: No   OB History   Grav Para Term Preterm Abortions TAB SAB Ect Mult Living                 Review of Systems  All other systems reviewed and are negative.     Allergies  Amoxapine and related; Amoxicillin; Ciprocinonide; Ciprofloxacin; Doxycycline; Erythromycin; Sulfa antibiotics; and Zofran  Home Medications   Current Outpatient Rx  Name  Route  Sig  Dispense  Refill  . albuterol (PROVENTIL HFA;VENTOLIN HFA) 108 (90 BASE) MCG/ACT inhaler   Inhalation   Inhale 2 puffs into the lungs every 4 (four) hours as needed for wheezing or shortness of breath (((PLAN B))).         Marland Kitchen albuterol (PROVENTIL) (2.5 MG/3ML) 0.083% nebulizer solution   Nebulization   Take 2.5 mg by nebulization every 4 (four) hours as needed for wheezing or shortness of breath (((PLAN C))).          Marland Kitchen aspirin EC 81 MG tablet   Oral   Take 81 mg by mouth every other day.          Marland Kitchen  diltiazem (CARTIA XT) 240 MG 24 hr capsule   Oral   Take 1 capsule (240 mg total) by mouth daily.   90 capsule   2   . gabapentin (NEURONTIN) 300 MG capsule   Oral   Take 300 mg by mouth. Does not take when on tramadol         . HYDROcodone-acetaminophen (NORCO/VICODIN) 5-325 MG per tablet   Oral   Take 1-2 tablets by mouth every 6 (six) hours as needed for moderate pain.   20 tablet   0   . loperamide (IMODIUM A-D) 2 MG tablet      Per box as needed for diarrhea         . LORazepam (ATIVAN) 0.5 MG tablet      1/2 to 1 tablet up to 3 x daily as need ed for anxietyFor anxiety   90 tablet   5   . Magnesium 500 MG TABS      1/2 tab twice daily         . mirtazapine (REMERON) 45 MG tablet   Oral   Take 1 tablet (45 mg total) by mouth at  bedtime.   90 tablet   99   . mometasone-formoterol (DULERA) 100-5 MCG/ACT AERO   Inhalation   Inhale 2 puffs into the lungs 2 (two) times daily.   1 Inhaler   3   . montelukast (SINGULAIR) 10 MG tablet   Oral   Take 10 mg by mouth daily.          Marland Kitchen omeprazole (PRILOSEC) 40 MG capsule   Oral   Take 1 capsule (40 mg total) by mouth 2 (two) times daily.   60 capsule   3   . oxybutynin (DITROPAN) 5 MG tablet   Oral   Take 5 mg by mouth at bedtime.          . predniSONE (DELTASONE) 20 MG tablet      Take one tablet three times daily with food for 3 days, take one tablet two times daily with food for 3 days, take one tablet daily for 5 days.   20 tablet   0   . QUEtiapine (SEROQUEL) 400 MG tablet   Oral   Take 1 tablet (400 mg total) by mouth at bedtime.   30 tablet   5   . simethicone (MYLICON) 80 MG chewable tablet      Per box as needed         . tiotropium (SPIRIVA) 18 MCG inhalation capsule   Inhalation   Place 18 mcg into inhaler and inhale daily.         . traMADol (ULTRAM) 50 MG tablet   Oral   Take 1 tablet (50 mg total) by mouth 3 (three) times daily. For pain   30 tablet   0   . TUDORZA PRESSAIR 400 MCG/ACT AEPB               . vitamin B-12 (CYANOCOBALAMIN) 1000 MCG tablet   Oral   Take 1,000 mcg by mouth daily.         . vitamin C (ASCORBIC ACID) 500 MG tablet   Oral   Take 500 mg by mouth daily.          BP 107/61  Pulse 77  Temp(Src) 98.8 F (37.1 C) (Oral)  Resp 22  SpO2 96% Physical Exam  Nursing note and vitals reviewed. Constitutional: She is oriented to person, place, and time. She  appears well-developed and well-nourished.  Non-toxic appearance. No distress.  HENT:  Head: Normocephalic and atraumatic.  Eyes: Conjunctivae, EOM and lids are normal. Pupils are equal, round, and reactive to light.  Neck: Normal range of motion. Neck supple. No tracheal deviation present. No mass present.  Cardiovascular: Normal rate,  regular rhythm and normal heart sounds.  Exam reveals no gallop.   No murmur heard. Pulmonary/Chest: Effort normal and breath sounds normal. No stridor. No respiratory distress. She has no decreased breath sounds. She has no wheezes. She has no rhonchi. She has no rales.  Abdominal: Soft. Normal appearance and bowel sounds are normal. She exhibits no distension. There is no tenderness. There is no rebound and no CVA tenderness.  Musculoskeletal: Normal range of motion. She exhibits no edema and no tenderness.  Neurological: She is alert and oriented to person, place, and time. She has normal strength. No cranial nerve deficit or sensory deficit. GCS eye subscore is 4. GCS verbal subscore is 5. GCS motor subscore is 6.  Skin: Skin is warm and dry. No abrasion and no rash noted.  Psychiatric: She has a normal mood and affect. Her speech is normal and behavior is normal.    ED Course  Procedures (including critical care time) Labs Review Labs Reviewed  CBC WITH DIFFERENTIAL - Abnormal; Notable for the following:    RBC 3.57 (*)    Hemoglobin 11.3 (*)    HCT 31.6 (*)    Monocytes Relative 16 (*)    Monocytes Absolute 1.6 (*)    All other components within normal limits  COMPREHENSIVE METABOLIC PANEL - Abnormal; Notable for the following:    Sodium 126 (*)    Chloride 90 (*)    Glucose, Bld 104 (*)    BUN 25 (*)    Creatinine, Ser 1.55 (*)    Total Protein 5.7 (*)    Albumin 3.1 (*)    Total Bilirubin <0.2 (*)    GFR calc non Af Amer 33 (*)    GFR calc Af Amer 38 (*)    All other components within normal limits  TROPONIN I  CK  I-STAT CG4 LACTIC ACID, ED   Imaging Review No results found.   EKG Interpretation   Date/Time:  Wednesday July 01 2013 18:36:26 EDT Ventricular Rate:  77 PR Interval:  158 QRS Duration: 89 QT Interval:  373 QTC Calculation: 422 R Axis:   73 Text Interpretation:  Sinus rhythm Low voltage, extremity leads Minimal ST  elevation, inferior leads No  significant change since last tracing  Confirmed by Kendarius Vigen  MD, Miliana Gangwer ([id number]) on 07/01/2013 6:41:17 PM      MDM   Final diagnoses:  None    patient given IV fluids here and does have a bump in her creatinine. She is also mildly hyponatremic and is symptomatic with this. She is not orthostatic at this time. Be admitted for IV hydration.     Leota Jacobsen, MD 07/01/13 2034

## 2013-07-01 NOTE — ED Notes (Signed)
Attempted call to floor; RN will call right back

## 2013-07-01 NOTE — ED Notes (Signed)
Pt to ED via [facility] after pt reported falling in yard.  Pt st's she felt weak.

## 2013-07-01 NOTE — H&P (Addendum)
[facility] History and Physical  [name] RXV:[id number] DOB: January 24, 1946 DOA: 07/01/2013  Referring physician: ER physician PCP: Alesia Richards, MD   Chief Complaint: dizziness, fall  HPI:  68 year old female with past medical history of COPD, hypertension, depression, GERD who presented to [facility] ED 07/01/2013 with complaints of lower extremity weakness and fall at home. Patient reported feeling dizzy and very weak prior to the fall . She did not lose consciousness. There was no loss of sensation. No other prodromal symptoms such as chest pain, shortness of breath or palpitations. This was not witnessed fall. No abdominal pain, nausea and vomiting. No blood in stool or urine. No reports of blurry vision or double vision prior or after the fall. No urinary incontinence.  In ED, BP was 90/55 and with IV fluids given in ED BP improved to 113/53. HR was 69, Tmax was 98.15F and oxygen saturation was 96% on room air. Blood work was significant for hemoglobin of 11.3, sodium of 126, creatinine 1.55 (in 04/2013 creatinine was 1.2). The initial troponin was <0.30 and her 12 lead EKG showed normal sinus rhythm. Her last 2 D ECHO was in 2011 with normal EF. CXR showed changes consistent with COPD.   Assessment and Plan:  Principal Problem: Fall, weakness - possibly due to dehydration. BP was 90/55 on admission and it has improved with IV fluids. She had normal CBG on admission, glucose 104. - would obtain CT head because of report of fall - continue IV fluids - follow up BMP in am - PT eval in am Active Problems: Hyponatremia - perhaps secondary to dehydration. Would continue IV fluids for now and recheck BMP in am   COPD GOLD III - stable respiratory status - may continue her usual bronchodilators per home regimen (spiriva, dulera, albuterol inh)   HTN (hypertension) - hold Cardizem due to soft BP   Depression - continue Seroquel    CKD (chronic kidney disease) stage 3, GFR 30-59  ml/min - creatinine in 04/2013 was 1.2 and this admission 1.55 which is likely due to prerenal azotemia/ dehydration - may continue IV fluids - follow up BMP in am   Anemia of chronic disease - secondary to chronic kidney disease - hemoglobin 11.3 on this admission - no current indications for transfusion     Radiological Exams on Admission: Dg Chest 2 View 07/01/2013  IMPRESSION: Findings consistent with chronic obstructive pulmonary disease. No acute cardiopulmonary abnormality seen.     EKG: Normal sinus rhythm, no ST/T wave changes  Code Status: Full Family Communication: Pt at bedside Disposition Plan: Admit for further evaluation  Robbie Lis, MD  [facility] Pager [phone]  Review of Systems:  Constitutional: Negative for fever, chills and malaise/fatigue. Negative for diaphoresis.  HENT: Negative for hearing loss, ear pain, nosebleeds, congestion, sore throat, neck pain, tinnitus and ear discharge.   Eyes: Negative for blurred vision, double vision, photophobia, pain, discharge and redness.  Respiratory: Negative for cough, hemoptysis, sputum production, shortness of breath, wheezing and stridor.   Cardiovascular: Negative for chest pain, palpitations, orthopnea, claudication and leg swelling.  Gastrointestinal: Negative for nausea, vomiting and abdominal pain. Negative for heartburn, constipation, blood in stool and melena.  Genitourinary: Negative for dysuria, urgency, frequency, hematuria and flank pain.  Musculoskeletal: Negative for myalgias, back pain, joint pain  Skin: Negative for itching and rash.  Neurological: pozitive for dizziness and weakness. Negative for tingling, tremors, sensory change, speech change, focal weakness, loss of consciousness and headaches.  Endo/Heme/Allergies: Negative for environmental allergies and polydipsia. Does not bruise/bleed easily.  Psychiatric/Behavioral: Negative for suicidal ideas. The patient is not nervous/anxious.       Past Medical History  Diagnosis Date  . Asthma   . Hypertension   . GERD (gastroesophageal reflux disease)   . Stroke     was on Plavix - not now  . Carotid artery aneurysm 03/04/2012  . DJD (degenerative joint disease)   . COPD (chronic obstructive pulmonary disease)   . Depression   . Shortness of breath    Past Surgical History  Procedure Laterality Date  . Video bronchoscopy  04/24/2012    Procedure: VIDEO BRONCHOSCOPY WITHOUT FLUORO;  Surgeon: Rigoberto Noel, MD;  Location: [place];  Service: Cardiopulmonary;  Laterality: Bilateral;  . Tubal ligation  30 years  . Tonsillectomy    . Colonoscopy  2004  . Hernia repair Right 2014    inguinal  . Cataract extraction Bilateral   . Microlaryngoscopy with co2 laser and excision of vocal cord lesion N/A 06/05/2013    Procedure: MICROLARYNGOSCOPY WITH CO2 LASER AND EXCISION OF VOCAL CORD POLYPS;  Surgeon: Melida Quitter, MD;  Location: [place];  Service: ENT;  Laterality: N/A;   Social History:  reports that she has been smoking Cigarettes.  She has a 7.5 pack-year smoking history. She has never used smokeless tobacco. She reports that she does not drink alcohol or use illicit drugs.  Allergies  Allergen Reactions  . Amoxapine And Related Nausea And Vomiting  . Amoxicillin     itching  . Ciprocinonide [Fluocinolone]     Pt unsure  . Ciprofloxacin     Pt unsure  . Doxycycline Nausea And Vomiting  . Erythromycin Hives  . Sulfa Antibiotics Other (See Comments)    Unknown-reaction as a child  . Zofran [Ondansetron Hcl] Nausea And Vomiting    Family History  Problem Relation Age of Onset  . Breast cancer Mother   . Breast cancer Sister      Prior to Admission medications   Medication Sig Start Date End Date Taking? Authorizing Provider  albuterol (PROVENTIL HFA;VENTOLIN HFA) 108 (90 BASE) MCG/ACT inhaler Inhale 2 puffs into the lungs every 4 (four) hours as needed for wheezing or shortness of breath (((PLAN B))).  05/29/13  Yes Tammy S Parrett, NP  aspirin EC 81 MG tablet Take 81 mg by mouth daily.    Yes Historical Provider, MD  diltiazem (CARTIA XT) 240 MG 24 hr capsule Take 1 capsule (240 mg total) by mouth daily. 06/16/13  Yes Vicie Mutters, PA-C  fluconazole (DIFLUCAN) 100 MG tablet Take 100 mg by mouth daily.  06/18/13  Yes Historical Provider, MD  gabapentin (NEURONTIN) 300 MG capsule Take 300 mg by mouth. Does not take when on tramadol   Yes Historical Provider, MD  HYDROcodone-acetaminophen (NORCO/VICODIN) 5-325 MG per tablet Take 1-2 tablets by mouth every 6 (six) hours as needed for moderate pain. 06/06/13  Yes Melida Quitter, MD  loperamide (IMODIUM A-D) 2 MG tablet Take 2 mg by mouth as needed for diarrhea or loose stools. Per box as needed for diarrhea   Yes Historical Provider, MD  LORazepam (ATIVAN) 0.5 MG tablet Take 1 mg by mouth at bedtime.   Yes Historical Provider, MD  Magnesium 500 MG TABS Take 500 mg by mouth daily.    Yes Historical Provider, MD  mirtazapine (REMERON) 45 MG tablet Take 1 tablet (45 mg total) by mouth at bedtime. 05/06/13  Yes Unk Pinto, MD  mometasone-formoterol (DULERA) 100-5 MCG/ACT AERO Inhale 2 puffs into the lungs 2 (two) times daily. 06/19/13  Yes Rigoberto Noel, MD  montelukast (SINGULAIR) 10 MG tablet Take 10 mg by mouth daily.  01/11/12  Yes Historical Provider, MD  omeprazole (PRILOSEC) 40 MG capsule Take 1 capsule (40 mg total) by mouth 2 (two) times daily. 06/16/13  Yes Vicie Mutters, PA-C  oxybutynin (DITROPAN) 5 MG tablet Take 5 mg by mouth at bedtime.  01/16/12  Yes Historical Provider, MD  QUEtiapine (SEROQUEL) 400 MG tablet Take 1 tablet (400 mg total) by mouth at bedtime. 06/30/13 12/30/13 Yes Unk Pinto, MD  tiotropium (SPIRIVA) 18 MCG inhalation capsule Place 18 mcg into inhaler and inhale daily.   Yes Historical Provider, MD  traMADol (ULTRAM) 50 MG tablet Take 1 tablet (50 mg total) by mouth 3 (three) times daily. For pain 06/30/13 07/30/13 Yes Unk Pinto, MD  TUDORZA PRESSAIR 400 MCG/ACT AEPB Take 1 puff by mouth daily.  05/20/13  Yes Historical Provider, MD  vitamin B-12 (CYANOCOBALAMIN) 1000 MCG tablet Take 1,000 mcg by mouth daily.   Yes Historical Provider, MD  vitamin C (ASCORBIC ACID) 500 MG tablet Take 500 mg by mouth daily.   Yes Historical Provider, MD   Physical Exam: Filed Vitals:   07/01/13 1854 07/01/13 1855 07/01/13 1915 07/01/13 2021  BP: 90/55 101/53 113/53 109/53  Pulse: 70 72 69 73  Temp:      TempSrc:      Resp:   13   SpO2:   97% 96%    Physical Exam  Constitutional: Appears well-developed and well-nourished. No distress.  HENT: Normocephalic. External right and left ear normal. Oropharynx is clear and moist.  Eyes: Conjunctivae and EOM are normal. PERRLA, no scleral icterus.  Neck: Normal ROM. Neck supple. No JVD. No tracheal deviation. No thyromegaly.  CVS: RRR, S1/S2 +, no murmurs, no gallops, no carotid bruit.  Pulmonary: Effort and breath sounds normal, no stridor, rhonchi, wheezes, rales.  Abdominal: Soft. BS +,  no distension, tenderness, rebound or guarding.  Musculoskeletal: Normal range of motion. No edema and no tenderness.  Lymphadenopathy: No lymphadenopathy noted, cervical, inguinal. Neuro: Alert. Normal reflexes, muscle tone coordination. No cranial nerve deficit. Skin: Skin is warm and dry. No rash noted. Not diaphoretic. No erythema. No pallor.  Psychiatric: Normal mood and affect. Behavior, judgment, thought content normal.   Labs on Admission:  Basic Metabolic Panel:  Recent Labs Lab 07/01/13 1841  NA 126*  K 4.7  CL 90*  CO2 23  GLUCOSE 104*  BUN 25*  CREATININE 1.55*  CALCIUM 8.4   Liver Function Tests:  Recent Labs Lab 07/01/13 1841  AST 21  ALT 13  ALKPHOS 71  BILITOT <0.2*  PROT 5.7*  ALBUMIN 3.1*   No results found for this basename: LIPASE, AMYLASE,  in the last 168 hours No results found for this basename: AMMONIA,  in the last 168 hours CBC:  Recent  Labs Lab 07/01/13 1841  WBC 9.6  NEUTROABS 6.2  HGB 11.3*  HCT 31.6*  MCV 88.5  PLT 232   Cardiac Enzymes:  Recent Labs Lab 07/01/13 1841  CKTOTAL 97  TROPONINI <0.30   BNP: No components found with this basename: POCBNP,  CBG: No results found for this basename: GLUCAP,  in the last 168 hours  If 7PM-7AM, please contact night-coverage [email or web address] Password [facility] 07/01/2013, 9:32 PM

## 2013-07-01 NOTE — ED Notes (Signed)
Patient transported to X-ray 

## 2013-07-01 NOTE — ED Notes (Signed)
CG-4 shown to Dr. Christy Gentles

## 2013-07-01 NOTE — ED Notes (Signed)
Wasn't able to perform standing orthostatic vitals, pt unable to stand.

## 2013-07-02 DIAGNOSIS — I369 Nonrheumatic tricuspid valve disorder, unspecified: Secondary | ICD-10-CM | POA: Diagnosis not present

## 2013-07-02 DIAGNOSIS — I959 Hypotension, unspecified: Secondary | ICD-10-CM

## 2013-07-02 DIAGNOSIS — E871 Hypo-osmolality and hyponatremia: Secondary | ICD-10-CM | POA: Diagnosis not present

## 2013-07-02 DIAGNOSIS — R29898 Other symptoms and signs involving the musculoskeletal system: Secondary | ICD-10-CM

## 2013-07-02 DIAGNOSIS — N179 Acute kidney failure, unspecified: Secondary | ICD-10-CM | POA: Diagnosis not present

## 2013-07-02 DIAGNOSIS — N189 Chronic kidney disease, unspecified: Secondary | ICD-10-CM

## 2013-07-02 DIAGNOSIS — E860 Dehydration: Secondary | ICD-10-CM | POA: Diagnosis not present

## 2013-07-02 LAB — CBC WITH DIFFERENTIAL/PLATELET
Basophils Absolute: 0 10*3/uL (ref 0.0–0.1)
Basophils Relative: 0 % (ref 0–1)
EOS ABS: 0.3 10*3/uL (ref 0.0–0.7)
EOS PCT: 3 % (ref 0–5)
HCT: 31.7 % — ABNORMAL LOW (ref 36.0–46.0)
Hemoglobin: 11 g/dL — ABNORMAL LOW (ref 12.0–15.0)
LYMPHS ABS: 2.1 10*3/uL (ref 0.7–4.0)
Lymphocytes Relative: 25 % (ref 12–46)
MCH: 31.3 pg (ref 26.0–34.0)
MCHC: 34.7 g/dL (ref 30.0–36.0)
MCV: 90.3 fL (ref 78.0–100.0)
MONO ABS: 1.5 10*3/uL — AB (ref 0.1–1.0)
Monocytes Relative: 18 % — ABNORMAL HIGH (ref 3–12)
Neutro Abs: 4.4 10*3/uL (ref 1.7–7.7)
Neutrophils Relative %: 54 % (ref 43–77)
Platelets: 221 10*3/uL (ref 150–400)
RBC: 3.51 MIL/uL — ABNORMAL LOW (ref 3.87–5.11)
RDW: 13.9 % (ref 11.5–15.5)
WBC: 8.4 10*3/uL (ref 4.0–10.5)

## 2013-07-02 LAB — PROTIME-INR
INR: 0.98 (ref 0.00–1.49)
Prothrombin Time: 12.8 seconds (ref 11.6–15.2)

## 2013-07-02 LAB — COMPREHENSIVE METABOLIC PANEL
ALK PHOS: 64 U/L (ref 39–117)
ALT: 12 U/L (ref 0–35)
AST: 19 U/L (ref 0–37)
Albumin: 2.8 g/dL — ABNORMAL LOW (ref 3.5–5.2)
BUN: 18 mg/dL (ref 6–23)
CO2: 23 meq/L (ref 19–32)
Calcium: 8.4 mg/dL (ref 8.4–10.5)
Chloride: 99 mEq/L (ref 96–112)
Creatinine, Ser: 1.18 mg/dL — ABNORMAL HIGH (ref 0.50–1.10)
GFR calc Af Amer: 53 mL/min — ABNORMAL LOW (ref >90)
GFR calc non Af Amer: 46 mL/min — ABNORMAL LOW (ref >90)
GLUCOSE: 118 mg/dL — AB (ref 70–99)
POTASSIUM: 4 meq/L (ref 3.7–5.3)
SODIUM: 134 meq/L — AB (ref 137–147)
TOTAL PROTEIN: 5.2 g/dL — AB (ref 6.0–8.3)
Total Bilirubin: <0.2 mg/dL — ABNORMAL LOW (ref 0.3–1.2)

## 2013-07-02 LAB — GLUCOSE, CAPILLARY: GLUCOSE-CAPILLARY: 163 mg/dL — AB (ref 70–99)

## 2013-07-02 LAB — MAGNESIUM: Magnesium: 2 mg/dL (ref 1.5–2.5)

## 2013-07-02 LAB — TSH: TSH: 0.441 u[IU]/mL (ref 0.350–4.500)

## 2013-07-02 LAB — PHOSPHORUS: Phosphorus: 2.8 mg/dL (ref 2.3–4.6)

## 2013-07-02 LAB — APTT: APTT: 32 s (ref 24–37)

## 2013-07-02 MED ORDER — MOMETASONE FURO-FORMOTEROL FUM 100-5 MCG/ACT IN AERO
2.0000 | INHALATION_SPRAY | Freq: Two times a day (BID) | RESPIRATORY_TRACT | Status: DC
  Administered 2013-07-02 – 2013-07-03 (×3): 2 via RESPIRATORY_TRACT
  Filled 2013-07-02: qty 8.8

## 2013-07-02 MED ORDER — MAGNESIUM 500 MG PO TABS: 500.0000 mg | ORAL_TABLET | Freq: Every day | ORAL | Status: DC

## 2013-07-02 MED ORDER — TIOTROPIUM BROMIDE MONOHYDRATE 18 MCG IN CAPS
18.0000 ug | ORAL_CAPSULE | Freq: Every day | RESPIRATORY_TRACT | Status: DC
  Administered 2013-07-02 – 2013-07-03 (×2): 18 ug via RESPIRATORY_TRACT
  Filled 2013-07-02: qty 5

## 2013-07-02 MED ORDER — GABAPENTIN 300 MG PO CAPS
300.0000 mg | ORAL_CAPSULE | Freq: Two times a day (BID) | ORAL | Status: DC
Start: 2013-07-02 — End: 2013-07-03
  Administered 2013-07-02 – 2013-07-03 (×3): 300 mg via ORAL
  Filled 2013-07-02 (×5): qty 1

## 2013-07-02 MED ORDER — MORPHINE SULFATE 2 MG/ML IJ SOLN: 1.0000 mg | INTRAMUSCULAR | Status: DC | PRN

## 2013-07-02 MED ORDER — QUETIAPINE FUMARATE 400 MG PO TABS
400.0000 mg | ORAL_TABLET | Freq: Every day | ORAL | Status: DC
  Administered 2013-07-02: 400 mg via ORAL
  Filled 2013-07-02 (×3): qty 1

## 2013-07-02 MED ORDER — HYDROCODONE-ACETAMINOPHEN 5-325 MG PO TABS: 1.0000 | ORAL_TABLET | Freq: Four times a day (QID) | ORAL | Status: DC | PRN

## 2013-07-02 MED ORDER — MONTELUKAST SODIUM 10 MG PO TABS
10.0000 mg | ORAL_TABLET | Freq: Every day | ORAL | Status: DC
  Administered 2013-07-02 – 2013-07-03 (×2): 10 mg via ORAL
  Filled 2013-07-02 (×2): qty 1

## 2013-07-02 MED ORDER — LORAZEPAM 1 MG PO TABS
1.0000 mg | ORAL_TABLET | Freq: Every day | ORAL | Status: DC
  Administered 2013-07-02: 1 mg via ORAL
  Filled 2013-07-02: qty 1

## 2013-07-02 MED ORDER — SODIUM CHLORIDE 0.9 % IV SOLN
INTRAVENOUS | Status: DC
  Administered 2013-07-02: 04:00:00 via INTRAVENOUS

## 2013-07-02 MED ORDER — TRAMADOL HCL 50 MG PO TABS
50.0000 mg | ORAL_TABLET | Freq: Three times a day (TID) | ORAL | Status: DC
  Administered 2013-07-02 – 2013-07-03 (×4): 50 mg via ORAL
  Filled 2013-07-02 (×4): qty 1

## 2013-07-02 MED ORDER — VITAMIN B-12 1000 MCG PO TABS
1000.0000 ug | ORAL_TABLET | Freq: Every day | ORAL | Status: DC
  Administered 2013-07-02 – 2013-07-03 (×2): 1000 ug via ORAL
  Filled 2013-07-02 (×2): qty 1

## 2013-07-02 MED ORDER — LOPERAMIDE HCL 2 MG PO CAPS: 2.0000 mg | ORAL_CAPSULE | ORAL | Status: DC | PRN

## 2013-07-02 MED ORDER — VITAMIN C 500 MG PO TABS
500.0000 mg | ORAL_TABLET | Freq: Every day | ORAL | Status: DC
  Administered 2013-07-02 – 2013-07-03 (×2): 500 mg via ORAL
  Filled 2013-07-02 (×2): qty 1

## 2013-07-02 MED ORDER — ASPIRIN EC 81 MG PO TBEC
81.0000 mg | DELAYED_RELEASE_TABLET | Freq: Every day | ORAL | Status: DC
  Administered 2013-07-02 – 2013-07-03 (×2): 81 mg via ORAL
  Filled 2013-07-02 (×2): qty 1

## 2013-07-02 MED ORDER — SODIUM CHLORIDE 0.9 % IV SOLN
INTRAVENOUS | Status: AC
  Administered 2013-07-02 (×2): via INTRAVENOUS

## 2013-07-02 MED ORDER — MAGNESIUM OXIDE 400 (241.3 MG) MG PO TABS
400.0000 mg | ORAL_TABLET | Freq: Every day | ORAL | Status: DC
  Administered 2013-07-02 – 2013-07-03 (×2): 400 mg via ORAL
  Filled 2013-07-02 (×2): qty 1

## 2013-07-02 MED ORDER — ALBUTEROL SULFATE (2.5 MG/3ML) 0.083% IN NEBU: 3.0000 mL | INHALATION_SOLUTION | RESPIRATORY_TRACT | Status: DC | PRN

## 2013-07-02 MED ORDER — ACETAMINOPHEN 325 MG PO TABS: 650.0000 mg | ORAL_TABLET | Freq: Four times a day (QID) | ORAL | Status: DC | PRN

## 2013-07-02 MED ORDER — OXYBUTYNIN CHLORIDE 5 MG PO TABS
5.0000 mg | ORAL_TABLET | Freq: Every day | ORAL | Status: DC
  Administered 2013-07-02: 5 mg via ORAL
  Filled 2013-07-02 (×3): qty 1

## 2013-07-02 MED ORDER — ACETAMINOPHEN 650 MG RE SUPP: 650.0000 mg | Freq: Four times a day (QID) | RECTAL | Status: DC | PRN

## 2013-07-02 MED ORDER — FLUCONAZOLE 100 MG PO TABS
100.0000 mg | ORAL_TABLET | Freq: Every day | ORAL | Status: DC
  Administered 2013-07-03: 100 mg via ORAL
  Filled 2013-07-02 (×2): qty 1

## 2013-07-02 MED ORDER — PANTOPRAZOLE SODIUM 40 MG PO TBEC
40.0000 mg | DELAYED_RELEASE_TABLET | Freq: Every day | ORAL | Status: DC
  Administered 2013-07-02 – 2013-07-03 (×2): 40 mg via ORAL
  Filled 2013-07-02 (×2): qty 1

## 2013-07-02 MED ORDER — MIRTAZAPINE 45 MG PO TABS
45.0000 mg | ORAL_TABLET | Freq: Every day | ORAL | Status: DC
  Administered 2013-07-02: 45 mg via ORAL
  Filled 2013-07-02 (×3): qty 1

## 2013-07-02 NOTE — Progress Notes (Signed)
Physical Therapy Treatment Patient Details Name: [name] MRN: [id number] DOB: [birth year] Today's Date: 07/02/2013    History of Present Illness 67 year old female with past medical history of COPD, hypertension, depression, GERD who presented to [facility] ED 07/01/2013 with complaints of lower extremity weakness and fall at home. Patient reported feeling dizzy and very weak prior to the fall     PT Comments    Pt admitted with/for fall and dizziness.  Pt currently limited functionally due to the problems listed below.  (see problems list.)  Pt will benefit from PT to maximize function and safety to be able to get home safely with limited available assist .   Follow Up Recommendations  No PT follow up     Equipment Recommendations  None recommended by PT    Recommendations for Other Services       Precautions / Restrictions Precautions Precautions: Fall    Mobility  Bed Mobility Overal bed mobility: Needs Assistance Bed Mobility: Supine to Sit     Supine to sit: Supervision     General bed mobility comments: mild struggle due to pain/soreness  Transfers Overall transfer level: Needs assistance   Transfers: Sit to/from Stand Sit to Stand: Min guard         General transfer comment: safe technique  Ambulation/Gait Ambulation/Gait assistance: Min guard Ambulation Distance (Feet): 160 Feet Assistive device: None Gait Pattern/deviations: Step-through pattern;Antalgic;Staggering right   Gait velocity interpretation: Below normal speed for age/gender General Gait Details: antalgic gait worse on right, but improved as she progressed walking   Stairs            Wheelchair Mobility    Modified Rankin (Stroke Patients Only)       Balance Overall balance assessment: Needs assistance Sitting-balance support: Feet supported Sitting balance-Leahy Scale: Fair     Standing balance support: No upper extremity supported Standing balance-Leahy Scale: Fair                       Cognition Arousal/Alertness: Awake/alert Behavior During Therapy: WFL for tasks assessed/performed Overall Cognitive Status: Within Functional Limits for tasks assessed                      Exercises      General Comments        Pertinent Vitals/Pain     Home Living Family/patient expects to be discharged to:: Private residence Living Arrangements: Alone Available Help at Discharge: Friend(s);Available PRN/intermittently Type of Home: House Home Access: Stairs to enter Entrance Stairs-Rails: Right;Left Home Layout: One level        Prior Function Level of Independence: Independent          PT Goals (current goals can now be found in the care plan section) Acute Rehab PT Goals Patient Stated Goal: independent and safe at home PT Goal Formulation: With patient Time For Goal Achievement: 07/02/13 Potential to Achieve Goals: Good    Frequency  Min 2X/week    PT Plan      Co-evaluation             End of Session   Activity Tolerance: Patient tolerated treatment well Patient left: in bed;with call bell/phone within reach     Time: 1259-1330 PT Time Calculation (min): 31 min  Charges:  $Gait Training: 8-22 mins $Therapeutic Activity: 8-22 mins                    G Codes:  Tessie Fass Autumm Hattery 07/02/2013, 1:42 PM 07/02/2013  Donnella Sham, [place] [phone]  (pager)

## 2013-07-02 NOTE — Progress Notes (Addendum)
PROGRESS NOTE    [name] KGU:[id number] DOB: [birth year] DOA: 07/01/2013 PCP: Alesia Richards, MD  HPI/Brief narrative 68 year old female with history of COPD, HTN, depression, GERD, CVA, status post endoscopy and excision of vocal cord polyps on 3/13, hoarseness, tobacco abuse, admitted on 07/01/13 following sudden onset lower extremity weakness and fall. She states that 2 days ago, while she was in her backyard, she felt unwell and weak and her brother had to walk her into the house and she laid on her couch and slept for hours. On 4/8 afternoon, while she was in her backyard again, without any premonition or symptoms, had leg suddenly gave way and she fell down. She denies preceding dizziness, lightheadedness, chest pain, palpitations or headache. She denies LOC, head injury, asymmetric limb weakness, slurred speech or facial asymmetry. She made several attempts to grab onto something to stand up but was unable to. In ED, BP was 90/55 and with IV fluids given in ED BP improved to 113/53. HR was 69, Tmax was 98.62F and oxygen saturation was 96% on room air. Blood work was significant for hemoglobin of 11.3, sodium of 126, creatinine 1.55 (in 04/2013 creatinine was 1.2). The initial troponin was <0.30 and her 12 lead EKG showed normal sinus rhythm. Her last 2 D ECHO was in 2011 with normal EF. CXR showed changes consistent with COPD. CT head was negative for acute findings.   Assessment/Plan:  1. Bilateral lower extremity weakness/fall: Unclear etiology-? Related to hypotension, dehydration and hyponatremia. States that the weakness is improved but not resolved. No focal deficits but still has some weakness in lower extremities. PT and OT evaluation. Discussed with neurology who will formally consult. No back pain or sphincter disturbances. 2. Hypotension: May be secondary to dehydration but patient denies any GI symptoms and has had a good appetite with ad lib. fluid intake. Improved after  IV fluids. 3. Dehydration with hyponatremia: Improved after IV normal saline. 4. Acute on chronic kidney disease stage III: Secondary to dehydration. Improved. Follow BMP 5. Mild anemia: Seems chronic. Follow CBC 6. Hypertension: As above. Cardizem held secondary to hypotension 7. COPD: Stable. Tobacco cessation counseled. 8. History of CVA: No focal deficits. CT head without acute findings. 9. Hoarseness/recent vocal cord surgery 10. History of depression: Continue Seroquel and Remeron. Stable   Code Status:  Full Family Communication:  None at bedside Disposition Plan:  Home when medically stable   Consultants:   Neurology  Procedures:   None  Antibiotics:   None   Subjective:  Lower extremity weakness is better but not resolved. Denies GI complaints and says has a good appetite.  Objective: Filed Vitals:   07/01/13 2349 07/02/13 0500 07/02/13 0546 07/02/13 0918  BP: 110/56  128/62   Pulse: 72  71   Temp: 97.8 F (36.6 C)  97.6 F (36.4 C)   TempSrc: Oral  Axillary   Resp: 18  20   Height: 5\' 1"  (1.549 m)     Weight: 43.455 kg (95 lb 12.8 oz) 44.226 kg (97 lb 8 oz)    SpO2: 97%  96% 100%    Intake/Output Summary (Last 24 hours) at 07/02/13 1310 Last data filed at 07/02/13 0900  Gross per 24 hour  Intake    510 ml  Output   2050 ml  Net  -1540 ml   Filed Weights   07/01/13 2349 07/02/13 0500  Weight: 43.455 kg (95 lb 12.8 oz) 44.226 kg (97 lb 8 oz)  Exam:  General exam:  Small built and frail middle-aged female lying comfortably in bed. Hoarse Respiratory system: Clear. No increased work of breathing. Cardiovascular system: S1 & S2 heard, RRR. No JVD, murmurs, gallops, clicks or pedal edema. Telemetry: Sinus rhythm Gastrointestinal system: Abdomen is nondistended, soft and nontender. Normal bowel sounds heard. Central nervous system: Alert and oriented. No focal neurological deficits. Extremities: Symmetric 5 x 5 power in upper extremities and 4/5  in bilateral lower extremities .   Data Reviewed: Basic Metabolic Panel:  Recent Labs Lab 07/01/13 1841 07/02/13 0216  NA 126* 134*  K 4.7 4.0  CL 90* 99  CO2 23 23  GLUCOSE 104* 118*  BUN 25* 18  CREATININE 1.55* 1.18*  CALCIUM 8.4 8.4  MG  --  2.0  PHOS  --  2.8   Liver Function Tests:  Recent Labs Lab 07/01/13 1841 07/02/13 0216  AST 21 19  ALT 13 12  ALKPHOS 71 64  BILITOT <0.2* <0.2*  PROT 5.7* 5.2*  ALBUMIN 3.1* 2.8*   No results found for this basename: LIPASE, AMYLASE,  in the last 168 hours No results found for this basename: AMMONIA,  in the last 168 hours CBC:  Recent Labs Lab 07/01/13 1841 07/02/13 0216  WBC 9.6 8.4  NEUTROABS 6.2 4.4  HGB 11.3* 11.0*  HCT 31.6* 31.7*  MCV 88.5 90.3  PLT 232 221   Cardiac Enzymes:  Recent Labs Lab 07/01/13 1841  CKTOTAL 97  TROPONINI <0.30   BNP (last 3 results)  Recent Labs  02/10/13 1130  PROBNP 305.9*   CBG:  Recent Labs Lab 07/02/13 0614  GLUCAP 163*    No results found for this or any previous visit (from the past 240 hour(s)).    Additional labs: 1.  TSH: 0.441     Studies: Dg Chest 2 View  07/01/2013   CLINICAL DATA:  Weakness.  EXAM: CHEST  2 VIEW  COMPARISON:  May 08, 2013.  FINDINGS: Cardiomediastinal silhouette appears normal. Hyperexpansion of the lungs is noted consistent with chronic obstructive pulmonary disease. No pneumothorax or pleural effusion is noted. Calcified left axillary lymph node is noted. No acute pulmonary disease is noted.  IMPRESSION: Findings consistent with chronic obstructive pulmonary disease. No acute cardiopulmonary abnormality seen.   Electronically Signed   By: Sabino Dick M.D.   On: 07/01/2013 19:50   Ct Head Wo Contrast  07/01/2013   CLINICAL DATA:  Dizziness and fall  EXAM: CT HEAD WITHOUT CONTRAST  TECHNIQUE: Contiguous axial images were obtained from the base of the skull through the vertex without intravenous contrast.  COMPARISON:   05/10/2009  FINDINGS: Skull and Sinuses:No significant abnormality.  Orbits: Bilateral cataract resection  Brain: No evidence of acute abnormality, such as acute infarction, hemorrhage, hydrocephalus, or mass lesion/mass effect. Chronic small vessel disease with ischemic gliosis confluent around the frontal horns of the lateral ventricles. Remote cortical and subcortical infarct in the posterior left frontal lobe and left parietal lobe.  IMPRESSION: 1. No evidence of acute intracranial disease. 2. Chronic small vessel disease and remote left frontal parietal infarct.   Electronically Signed   By: Jorje Guild M.D.   On: 07/01/2013 22:45        Scheduled Meds: . aspirin EC  81 mg Oral Daily  . fluconazole  100 mg Oral Daily  . gabapentin  300 mg Oral BID  . LORazepam  1 mg Oral QHS  . magnesium oxide  400 mg Oral Daily  . mirtazapine  45 mg Oral QHS  . mometasone-formoterol  2 puff Inhalation BID  . montelukast  10 mg Oral Daily  . oxybutynin  5 mg Oral QHS  . pantoprazole  40 mg Oral Daily  . QUEtiapine  400 mg Oral QHS  . tiotropium  18 mcg Inhalation Daily  . traMADol  50 mg Oral TID  . vitamin B-12  1,000 mcg Oral Daily  . vitamin C  500 mg Oral Daily   Continuous Infusions: . sodium chloride 125 mL/hr at 07/01/13 1846  . sodium chloride 100 mL/hr at 07/02/13 0344    Principal Problem:   Hyponatremia Active Problems:   COPD GOLD III   HTN (hypertension)   Depression, psychotic   CKD (chronic kidney disease) stage 3, GFR 30-59 ml/min   Anemia of chronic disease    Time spent: 40 minutes    Modena Jansky, MD, FACP, [facility]. [facility] Pager [phone]  If 7PM-7AM, please contact night-coverage [email or web address] Password [email or web address] 07/02/2013, 1:10 PM    LOS: 1 day

## 2013-07-02 NOTE — Progress Notes (Signed)
  Echocardiogram 2D Echocardiogram has been performed.  [name] 07/02/2013, 2:42 PM

## 2013-07-02 NOTE — Progress Notes (Addendum)
NEURO HOSPITALIST CONSULT NOTE    Reason for Consult: LE weakness  HPI:                                                                                                                                          [name] is an 68 y.o. female who has no history of falls or LE weakness in the past.  She states she was on the porch with her brother Monday when she felt light headed and tired.  HE helped her walk into the house where she laid on the couch for the majority of the day.  When she would stand up to go to the bathroom she states she would vere to the left and right using the walls to balance herself. She was feeling normal on Tuesday but on Wednesday she was out in the yard when her legs suddenly gave out on her. She could not "stand up for about 45 minutes and would try to use the lattice work on her porch to pull herself up but could not."  Her phone which was next to her range and her daughter was on the other line who told her to call EMS.    Of note--she stated she had not urinated for 2 days which was not normal for her  Patient was brought to ED where BP was 90/55 and with IV fluids given in ED BP improved to 113/53. Na 126, Cr 1.55, UA negative, 12 lead NSR.  Since admission she states her strength has returned to nearly baseline. She has pain in her right leg from her fall limiting strength exam.   PT has seen and evaluated patient --patient walked 160 feet without assistive device and min guard  Past Medical History  Diagnosis Date  . Asthma   . Hypertension   . GERD (gastroesophageal reflux disease)   . Stroke     was on Plavix - not now  . Carotid artery aneurysm 03/04/2012  . DJD (degenerative joint disease)   . COPD (chronic obstructive pulmonary disease)   . Depression   . Shortness of breath     Past Surgical History  Procedure Laterality Date  . Video bronchoscopy  04/24/2012    Procedure: VIDEO BRONCHOSCOPY WITHOUT FLUORO;  Surgeon:  Rigoberto Noel, MD;  Location: [facility];  Service: Cardiopulmonary;  Laterality: Bilateral;  . Tubal ligation  30 years  . Tonsillectomy    . Colonoscopy  2004  . Hernia repair Right 2014    inguinal  . Cataract extraction Bilateral   . Microlaryngoscopy with co2 laser and excision of vocal cord lesion N/A 06/05/2013    Procedure: MICROLARYNGOSCOPY WITH CO2 LASER AND EXCISION OF VOCAL CORD POLYPS;  Surgeon: Melida Quitter, MD;  Location: [facility];  Service: ENT;  Laterality: N/A;    Family History  Problem Relation Age of Onset  . Breast cancer Mother   . Breast cancer Sister      Social History:  reports that she has been smoking Cigarettes.  She has a 7.5 pack-year smoking history. She has never used smokeless tobacco. She reports that she does not drink alcohol or use illicit drugs.  Allergies  Allergen Reactions  . Amoxapine And Related Nausea And Vomiting  . Amoxicillin     itching  . Ciprocinonide [Fluocinolone]     Pt unsure  . Ciprofloxacin     Pt unsure  . Doxycycline Nausea And Vomiting  . Erythromycin Hives  . Sulfa Antibiotics Other (See Comments)    Unknown-reaction as a child  . Zofran [Ondansetron Hcl] Nausea And Vomiting    MEDICATIONS:                                                                                                                     Prior to Admission:  Prescriptions prior to admission  Medication Sig Dispense Refill  . albuterol (PROVENTIL HFA;VENTOLIN HFA) 108 (90 BASE) MCG/ACT inhaler Inhale 2 puffs into the lungs every 4 (four) hours as needed for wheezing or shortness of breath (((PLAN B))).      Marland Kitchen aspirin EC 81 MG tablet Take 81 mg by mouth daily.       Marland Kitchen diltiazem (CARTIA XT) 240 MG 24 hr capsule Take 1 capsule (240 mg total) by mouth daily.  90 capsule  2  . fluconazole (DIFLUCAN) 100 MG tablet Take 100 mg by mouth daily.       Marland Kitchen gabapentin (NEURONTIN) 300 MG capsule Take 300 mg by mouth. Does not take when on tramadol      .  HYDROcodone-acetaminophen (NORCO/VICODIN) 5-325 MG per tablet Take 1-2 tablets by mouth every 6 (six) hours as needed for moderate pain.  20 tablet  0  . loperamide (IMODIUM A-D) 2 MG tablet Take 2 mg by mouth as needed for diarrhea or loose stools. Per box as needed for diarrhea      . LORazepam (ATIVAN) 0.5 MG tablet Take 1 mg by mouth at bedtime.      . Magnesium 500 MG TABS Take 500 mg by mouth daily.       . mirtazapine (REMERON) 45 MG tablet Take 1 tablet (45 mg total) by mouth at bedtime.  90 tablet  99  . mometasone-formoterol (DULERA) 100-5 MCG/ACT AERO Inhale 2 puffs into the lungs 2 (two) times daily.  1 Inhaler  3  . montelukast (SINGULAIR) 10 MG tablet Take 10 mg by mouth daily.       Marland Kitchen omeprazole (PRILOSEC) 40 MG capsule Take 1 capsule (40 mg total) by mouth 2 (two) times daily.  60 capsule  3  . oxybutynin (DITROPAN) 5 MG tablet Take 5 mg by mouth at bedtime.       Marland Kitchen QUEtiapine (SEROQUEL) 400 MG tablet Take 1 tablet (400 mg  total) by mouth at bedtime.  30 tablet  5  . tiotropium (SPIRIVA) 18 MCG inhalation capsule Place 18 mcg into inhaler and inhale daily.      . traMADol (ULTRAM) 50 MG tablet Take 1 tablet (50 mg total) by mouth 3 (three) times daily. For pain  30 tablet  0  . TUDORZA PRESSAIR 400 MCG/ACT AEPB Take 1 puff by mouth daily.       . vitamin B-12 (CYANOCOBALAMIN) 1000 MCG tablet Take 1,000 mcg by mouth daily.      . vitamin C (ASCORBIC ACID) 500 MG tablet Take 500 mg by mouth daily.       Scheduled: . aspirin EC  81 mg Oral Daily  . fluconazole  100 mg Oral Daily  . gabapentin  300 mg Oral BID  . LORazepam  1 mg Oral QHS  . magnesium oxide  400 mg Oral Daily  . mirtazapine  45 mg Oral QHS  . mometasone-formoterol  2 puff Inhalation BID  . montelukast  10 mg Oral Daily  . oxybutynin  5 mg Oral QHS  . pantoprazole  40 mg Oral Daily  . QUEtiapine  400 mg Oral QHS  . tiotropium  18 mcg Inhalation Daily  . traMADol  50 mg Oral TID  . vitamin B-12  1,000 mcg Oral  Daily  . vitamin C  500 mg Oral Daily     ROS:                                                                                                                                       History obtained from the patient  General ROS: negative for - chills, fatigue, fever, night sweats, weight gain or weight loss Psychological ROS: negative for - behavioral disorder, hallucinations, memory difficulties, mood swings or suicidal ideation Ophthalmic ROS: negative for - blurry vision, double vision, eye pain or loss of vision ENT ROS: negative for - epistaxis, nasal discharge, oral lesions, sore throat, tinnitus or vertigo Allergy and Immunology ROS: negative for - hives or itchy/watery eyes Hematological and Lymphatic ROS: negative for - bleeding problems, bruising or swollen lymph nodes Endocrine ROS: negative for - galactorrhea, hair pattern changes, polydipsia/polyuria or temperature intolerance Respiratory ROS: negative for - cough, hemoptysis, shortness of breath or wheezing Cardiovascular ROS: negative for - chest pain, dyspnea on exertion, edema or irregular heartbeat Gastrointestinal ROS: negative for - abdominal pain, diarrhea, hematemesis, nausea/vomiting or stool incontinence Genito-Urinary ROS: negative for - dysuria, hematuria, incontinence or urinary frequency/urgency Musculoskeletal ROS: negative for - joint swelling or muscular weakness Neurological ROS: as noted in HPI Dermatological ROS: negative for rash and skin lesion changes   Blood pressure 135/68, pulse 84, temperature 97.5 F (36.4 C), temperature source Oral, resp. rate 20, height 5\' 1"  (1.549 m), weight 44.226 kg (97 lb 8 oz), SpO2 95.00%.   Neurologic Examination:  Mental Status: Alert, oriented, thought content appropriate.  Speech hypophonic but fluent without evidence of aphasia.  Able to follow 3 step commands  without difficulty. Cranial Nerves: II: Discs flat bilaterally; Visual fields grossly normal, pupils equal, round, reactive to light and accommodation III,IV, VI: ptosis not present, extra-ocular motions intact bilaterally V,VII: smile symmetric, facial light touch sensation normal bilaterally VIII: hearing normal bilaterally IX,X: gag reflex present XI: bilateral shoulder shrug XII: midline tongue extension without atrophy or fasciculations  Motor: Right : Upper extremity   5/5         Left:     Upper extremity   5/5  Lower extremity   5/5 Proximal (Limited by pain) but 5/5 distally     Lower extremity   4+/5 Tone and bulk:normal tone throughout; no atrophy noted Sensory: Pinprick and light touch intact throughout, bilaterally Deep Tendon Reflexes:  Right: Upper Extremity   Left: Upper extremity   biceps (C-5 to C-6) 2/4   biceps (C-5 to C-6) 2/4 tricep (C7) 2/4    triceps (C7) 2/4 Brachioradialis (C6) 2/4  Brachioradialis (C6) 2/4  Lower Extremity Lower Extremity  quadriceps (L-2 to L-4) 2/4   quadriceps (L-2 to L-4) 2/4 Achilles (S1) 1/4   Achilles (S1) 1/4  Plantars: Right: downgoing   Left: downgoing Cerebellar: normal finger-to-nose,  normal heel-to-shin test Gait: not assessed CV: pulses palpable throughout    Lab Results: Basic Metabolic Panel:  Recent Labs Lab 07/01/13 1841 07/02/13 0216  NA 126* 134*  K 4.7 4.0  CL 90* 99  CO2 23 23  GLUCOSE 104* 118*  BUN 25* 18  CREATININE 1.55* 1.18*  CALCIUM 8.4 8.4  MG  --  2.0  PHOS  --  2.8    Liver Function Tests:  Recent Labs Lab 07/01/13 1841 07/02/13 0216  AST 21 19  ALT 13 12  ALKPHOS 71 64  BILITOT <0.2* <0.2*  PROT 5.7* 5.2*  ALBUMIN 3.1* 2.8*   No results found for this basename: LIPASE, AMYLASE,  in the last 168 hours No results found for this basename: AMMONIA,  in the last 168 hours  CBC:  Recent Labs Lab 07/01/13 1841 07/02/13 0216  WBC 9.6 8.4  NEUTROABS 6.2 4.4  HGB 11.3*  11.0*  HCT 31.6* 31.7*  MCV 88.5 90.3  PLT 232 221    Cardiac Enzymes:  Recent Labs Lab 07/01/13 1841  CKTOTAL 97  TROPONINI <0.30    Lipid Panel: No results found for this basename: CHOL, TRIG, HDL, CHOLHDL, VLDL, LDLCALC,  in the last 168 hours  CBG:  Recent Labs Lab 07/02/13 0614  GLUCAP 163*    Microbiology: Results for orders placed in visit on 06/16/13  URINE CULTURE     Status: None   Collection Time    06/16/13  2:10 PM      Result Value Ref Range Status   Colony Count NO GROWTH   Final   Organism ID, Bacteria NO GROWTH   Final    Coagulation Studies:  Recent Labs  07/02/13 0216  LABPROT 12.8  INR 0.98    Imaging: Dg Chest 2 View  07/01/2013   CLINICAL DATA:  Weakness.  EXAM: CHEST  2 VIEW  COMPARISON:  May 08, 2013.  FINDINGS: Cardiomediastinal silhouette appears normal. Hyperexpansion of the lungs is noted consistent with chronic obstructive pulmonary disease. No pneumothorax or pleural effusion is noted. Calcified left axillary lymph node is noted. No acute pulmonary disease is noted.  IMPRESSION: Findings consistent with chronic  obstructive pulmonary disease. No acute cardiopulmonary abnormality seen.   Electronically Signed   By: Sabino Dick M.D.   On: 07/01/2013 19:50   Ct Head Wo Contrast  07/01/2013   CLINICAL DATA:  Dizziness and fall  EXAM: CT HEAD WITHOUT CONTRAST  TECHNIQUE: Contiguous axial images were obtained from the base of the skull through the vertex without intravenous contrast.  COMPARISON:  05/10/2009  FINDINGS: Skull and Sinuses:No significant abnormality.  Orbits: Bilateral cataract resection  Brain: No evidence of acute abnormality, such as acute infarction, hemorrhage, hydrocephalus, or mass lesion/mass effect. Chronic small vessel disease with ischemic gliosis confluent around the frontal horns of the lateral ventricles. Remote cortical and subcortical infarct in the posterior left frontal lobe and left parietal lobe.   IMPRESSION: 1. No evidence of acute intracranial disease. 2. Chronic small vessel disease and remote left frontal parietal infarct.   Electronically Signed   By: Jorje Guild M.D.   On: 07/01/2013 22:45    Raven Quill PA-C [facility] [id number]  07/02/2013, 3:47 PM  Patient seen and examined.  Clinical course and management discussed.  Necessary edits performed.  I agree with the above.  Assessment and plan of care developed and discussed below.    Assessment/Plan: 68 year old female presenting with lower extremity weakness that has improved.  She also complained of dizziness.  On presentation with multiple metabolic abnormalities which may very well have been the reason for her weakness.  BP was low as well.  Head CT reviewed and shows no acute changes.  Patient does have stroke risk factors though.  Would rule out an acute ischemic event.    Recommendations: 1. MRI of the brain without contrast.  Would not initiate a stroke work up unless imaging diagnostic of an acute event.   2. Agree with continued therapy and addressing of medical issues.      Alexis Goodell, MD [facility] [phone]  07/02/2013  4:37 PM

## 2013-07-03 ENCOUNTER — Inpatient Hospital Stay (HOSPITAL_COMMUNITY): Payer: Medicare Other

## 2013-07-03 DIAGNOSIS — N179 Acute kidney failure, unspecified: Secondary | ICD-10-CM | POA: Diagnosis not present

## 2013-07-03 DIAGNOSIS — S0990XA Unspecified injury of head, initial encounter: Secondary | ICD-10-CM | POA: Diagnosis not present

## 2013-07-03 DIAGNOSIS — R29898 Other symptoms and signs involving the musculoskeletal system: Secondary | ICD-10-CM | POA: Diagnosis not present

## 2013-07-03 DIAGNOSIS — E871 Hypo-osmolality and hyponatremia: Secondary | ICD-10-CM | POA: Diagnosis not present

## 2013-07-03 DIAGNOSIS — I959 Hypotension, unspecified: Secondary | ICD-10-CM | POA: Diagnosis not present

## 2013-07-03 DIAGNOSIS — E860 Dehydration: Secondary | ICD-10-CM | POA: Diagnosis not present

## 2013-07-03 LAB — BASIC METABOLIC PANEL
BUN: 9 mg/dL (ref 6–23)
CHLORIDE: 104 meq/L (ref 96–112)
CO2: 21 mEq/L (ref 19–32)
Calcium: 7.8 mg/dL — ABNORMAL LOW (ref 8.4–10.5)
Creatinine, Ser: 0.85 mg/dL (ref 0.50–1.10)
GFR calc Af Amer: 79 mL/min — ABNORMAL LOW (ref >90)
GFR, EST NON AFRICAN AMERICAN: 68 mL/min — AB (ref >90)
GLUCOSE: 100 mg/dL — AB (ref 70–99)
POTASSIUM: 4.2 meq/L (ref 3.7–5.3)
SODIUM: 138 meq/L (ref 137–147)

## 2013-07-03 LAB — CBC
HCT: 31.9 % — ABNORMAL LOW (ref 36.0–46.0)
HEMOGLOBIN: 10.9 g/dL — AB (ref 12.0–15.0)
MCH: 31 pg (ref 26.0–34.0)
MCHC: 34.2 g/dL (ref 30.0–36.0)
MCV: 90.6 fL (ref 78.0–100.0)
Platelets: 228 10*3/uL (ref 150–400)
RBC: 3.52 MIL/uL — AB (ref 3.87–5.11)
RDW: 13.9 % (ref 11.5–15.5)
WBC: 7.7 10*3/uL (ref 4.0–10.5)

## 2013-07-03 LAB — GLUCOSE, CAPILLARY: Glucose-Capillary: 93 mg/dL (ref 70–99)

## 2013-07-03 NOTE — Clinical Documentation Improvement (Signed)
  Note 4/9 states "Acute on chronic kidney disease stage 3". Please clarify if "Acute kidney disease" equates to Acute renal failure, AKI or other condition. Thank you.  Possible Clinical Conditions?  - Acute renal failure - AKI - Other condition (please speciy)  Thank You, Ezekiel Ina ,RN Clinical Documentation Specialist:  [phone]  [place] Information Management

## 2013-07-03 NOTE — Discharge Instructions (Signed)
Dehydration, Adult  Dehydration means your body does not have as much fluid as it needs. Your kidneys, brain, and heart will not work properly without the right amount of fluids and salt.   HOME CARE   Ask your doctor how to replace body fluid losses (rehydrate).   Drink enough fluids to keep your pee (urine) clear or pale yellow.   Drink small amounts of fluids often if you feel sick to your stomach (nauseous) or throw up (vomit).   Eat like you normally do.   Avoid:   Foods or drinks high in sugar.   Bubbly (carbonated) drinks.   Juice.   Very hot or cold fluids.   Drinks with caffeine.   Fatty, greasy foods.   Alcohol.   Tobacco.   Eating too much.   Gelatin desserts.   Wash your hands to avoid spreading germs (bacteria, viruses).   Only take medicine as told by your doctor.   Keep all doctor visits as told.  GET HELP RIGHT AWAY IF:    You cannot drink something without throwing up.   You get worse even with treatment.   Your vomit has blood in it or looks greenish.   Your poop (stool) has blood in it or looks black and tarry.   You have not peed in 6 to 8 hours.   You pee a small amount of very dark pee.   You have a fever.   You pass out (faint).   You have belly (abdominal) pain that gets worse or stays in one spot (localizes).   You have a rash, stiff neck, or bad headache.   You get easily annoyed, sleepy, or are hard to wake up.   You feel weak, dizzy, or very thirsty.  MAKE SURE YOU:    Understand these instructions.   Will watch your condition.   Will get help right away if you are not doing well or get worse.  Document Released: 01/06/2009 Document Revised: 06/04/2011 Document Reviewed: 10/30/2010  [employer] Patient Information 2014 [employer]

## 2013-07-03 NOTE — Progress Notes (Signed)
Physical Therapy Treatment Patient Details Name: [name] MRN: [id number] DOB: 02/18/1946 Today's Date: 07/03/2013    History of Present Illness 68 year old female with past medical history of COPD, hypertension, depression, GERD who presented to [facility] ED 07/01/2013 with complaints of lower extremity weakness and fall at home. Patient reported feeling dizzy and very weak prior to the fall     PT Comments    Initially concerned about R knee/leg giving away, but walked without assistive device, has been independent in her room and has negotiated steps without incident.  Pt should be ready to D/C home.  Follow Up Recommendations  No PT follow up     Equipment Recommendations  None recommended by PT    Recommendations for Other Services       Precautions / Restrictions Precautions Precautions: Fall    Mobility  Bed Mobility Overal bed mobility: Needs Assistance Bed Mobility: Supine to Sit     Supine to sit: Modified independent (Device/Increase time)        Transfers Overall transfer level: Modified independent   Transfers: Sit to/from Stand Sit to Stand: Modified independent (Device/Increase time)         General transfer comment: Earlier whe was concerned about her L LE giving away on her, but less so in therapy  Ambulation/Gait Ambulation/Gait assistance: Modified independent (Device/Increase time) Ambulation Distance (Feet): 300 Feet Assistive device: None;Rolling walker (2 wheeled) Gait Pattern/deviations: Step-through pattern;Decreased stride length   Gait velocity interpretation: at or above normal speed for age/gender General Gait Details: Mildly unsteady and guarded gait initially and progressively more steady as distance increases.   Stairs Stairs: Yes Stairs assistance: Min guard Stair Management: One rail Right;One rail Left;Alternating pattern;Step to pattern;Forwards (practicing different methods) Number of Stairs: 6    Wheelchair Mobility     Modified Rankin (Stroke Patients Only)       Balance Overall balance assessment: Needs assistance Sitting-balance support: Feet supported;No upper extremity supported Sitting balance-Leahy Scale: Good     Standing balance support: No upper extremity supported;During functional activity Standing balance-Leahy Scale: Good Standing balance comment: can reach outside BOS without LOB and to the floor to pick up obljects                    Cognition Arousal/Alertness: Awake/alert Behavior During Therapy: WFL for tasks assessed/performed Overall Cognitive Status: Within Functional Limits for tasks assessed                      Exercises      General Comments        Pertinent Vitals/Pain     Home Living                      Prior Function            PT Goals (current goals can now be found in the care plan section) Acute Rehab PT Goals Patient Stated Goal: independent and safe at home PT Goal Formulation: With patient Time For Goal Achievement: 07/02/13 Potential to Achieve Goals: Good Progress towards PT goals: Progressing toward goals    Frequency  Min 2X/week    PT Plan Current plan remains appropriate    Co-evaluation             End of Session   Activity Tolerance: Patient tolerated treatment well Patient left: in bed;with call bell/phone within reach     Time: 1120-1143 PT Time Calculation (min): 23  min  Charges:  $Gait Training: 8-22 mins $Therapeutic Activity: 8-22 mins                    G CodesTessie Fass Teddy Rebstock 07/03/2013, 11:55 AM 07/03/2013  Donnella Sham, [place] [phone]  (pager)

## 2013-07-03 NOTE — Progress Notes (Signed)
Nutrition Brief Note  Patient identified on the Malnutrition Screening Tool (MST) Report  Wt Readings from Last 15 Encounters:  07/02/13 97 lb 8 oz (44.226 kg)  06/17/13 95 lb 3.2 oz (43.182 kg)  06/16/13 96 lb (43.545 kg)  06/05/13 95 lb 10.9 oz (43.4 kg)  06/05/13 95 lb 10.9 oz (43.4 kg)  06/04/13 94 lb 9.6 oz (42.91 kg)  05/25/13 104 lb 14.4 oz (47.582 kg)  05/08/13 100 lb (45.36 kg)  04/29/13 99 lb 12.8 oz (45.269 kg)  03/17/13 100 lb (45.36 kg)  03/05/13 99 lb 9.6 oz (45.178 kg)  03/03/13 97 lb 9.6 oz (44.271 kg)  02/24/13 95 lb (43.092 kg)  02/10/13 92 lb (41.731 kg)  02/09/13 91 lb (41.277 kg)    Body mass index is 18.43 kg/(m^2). Patient meets criteria for Underweight based on current BMI.   Current diet order is Regular, patient is consuming approximately 75-100%% of meals at this time. Pt asleep at time of visit. Pt appears well-nourished and weight history shows pt's weight has been trending back up this past month. Labs and medications reviewed.   No nutrition interventions warranted at this time. If nutrition issues arise, please consult RD.   Pryor Ochoa RD, LDN Inpatient Clinical Dietitian Pager: [phone] After Hours Pager: [phone]

## 2013-07-03 NOTE — Discharge Summary (Signed)
Physician Discharge Summary  [name] NWG:[id number] DOB: Sep 10, 1945 DOA: 07/01/2013  PCP: Alesia Richards, MD  Admit date: 07/01/2013 Discharge date: 07/03/2013  Time spent: 40 minutes  Recommendations for Outpatient Follow-up:  1. Discharge home with outpt PCP follow up 2. Please recheck hb as outpt   Discharge Diagnoses:  Principal Problem:   Lower extremity weakness  Active Problems:   Dehydration with hyponatremia   Renal failure, acute on chronic   COPD GOLD III   HTN (hypertension)   Depression, psychotic   CKD (chronic kidney disease) stage 3, GFR 30-59 ml/min   Hyponatremia   Anemia of chronic disease   Hypotension, unspecified Hx of CVA   Discharge Condition: fair  Diet recommendation: cardiac  Filed Weights   07/01/13 2349 07/02/13 0500  Weight: 43.455 kg (95 lb 12.8 oz) 44.226 kg (97 lb 8 oz)    History of present illness:  Please refer to admission H&P for details, but in brief, 68 year old female with history of COPD, HTN, depression, GERD, CVA, status post endoscopy and excision of vocal cord polyps on 3/13, hoarseness, tobacco abuse, admitted on 07/01/13 following sudden onset lower extremity weakness and fall. She states that 2 days ago, while she was in her backyard, she felt unwell and weak and her brother had to walk her into the house and she laid on her couch and slept for hours. On 4/8 afternoon, while she was in her backyard again, without any premonition or symptoms, had leg suddenly gave way and she fell down. She denies preceding dizziness, lightheadedness, chest pain, palpitations or headache. She denies LOC, head injury, asymmetric limb weakness, slurred speech or facial asymmetry. She made several attempts to grab onto something to stand up but was unable to.  In ED, BP was 90/55 and with IV fluids given in ED BP improved to 113/53. HR was 69, Tmax was 98.21F and oxygen saturation was 96% on room air. Blood work was significant for hemoglobin  of 11.3, sodium of 126, creatinine 1.55 (in 04/2013 creatinine was 1.2). The initial troponin was <0.30 and her 12 lead EKG showed normal sinus rhythm. Her last 2 D ECHO was in 2011 with normal EF. CXR showed changes consistent with COPD. CT head was negative for acute findings.   Hospital Course:  Bilateral lower extremity weakness/fall:  Likely related to hypotension, dehydration and hyponatremia. Improved to normal.   Seen by PT and recommend no further w/up. Seen by neurology and recommended MRI brain which showed no acute infarct. Showed remote moderate sized left lacunar infarct. Patient is already on baby aspirin and her lipid panel from 2 months was normal. 2d echo with normal EF of 55-60%  Hypotension:  May be secondary to dehydration Improved after IV fluids.  Dehydration with hyponatremia:  Improved after IV normal saline.  Acute on chronic kidney disease stage III:  prerenal Secondary to dehydration. Resolved with fluids.  Anemia Noted for some drop in Hb to 11 from baseline hb of 12-13. Please follow up as outpt. No EGD or colonoscopy in the system. Please refer for outpt colonoscopy if hb continues to be low.  Hypertension: BP stable. Resume cardizem  COPD: Stable.counseled on  Tobacco cessation   History of CVA:  No focal deficits.  MRI brain shows remote lacunar infarct. Continue baby aspirin. Lipid panel 2 months back normal  Hoarseness of voice /recent vocal cord surgery  History of depression:  Continue Seroquel and Remeron. Stable  Code Status: Full  Family Communication: None at  bedside  Disposition Plan: patient stable for discharge Home with outp PCP follow up   Consultants:  Neurology   Procedures:  MRI brain  Antibiotics:  None    Discharge Exam: Filed Vitals:   07/03/13 1423  BP: 145/66  Pulse: 85  Temp: 97.8 F (36.6 C)  Resp: 18    General: elderly female in NAD  HEENT: no pallor, moist oral mucosa Chest : clear b/l, no added  sounds CVS: NS1&S2, no murmur, rubs or gallop  abd: soft, NT, ND, BS+ Ext: warm, no edema CNS: AAOX3, non focal   Discharge Instructions You were cared for by a hospitalist during your hospital stay. If you have any questions about your discharge medications or the care you received while you were in the hospital after you are discharged, you can call the unit and asked to speak with the hospitalist on call if the hospitalist that took care of you is not available. Once you are discharged, your primary care physician will handle any further medical issues. Please note that NO REFILLS for any discharge medications will be authorized once you are discharged, as it is imperative that you return to your primary care physician (or establish a relationship with a primary care physician if you do not have one) for your aftercare needs so that they can reassess your need for medications and monitor your lab values.   Future Appointments Provider Department Dept Phone   07/14/2013 3:15 PM [place], [place] [facility] [place]   08/18/2013 2:30 PM Unk Pinto, MD [place] [facility] [phone]   11/19/2013 2:30 PM Unk Pinto, MD [place] [facility] [phone]   04/29/2014 3:00 PM Unk Pinto, MD [place] [facility] [phone]       Medication List         albuterol 108 (90 BASE) MCG/ACT inhaler  Commonly known as:  PROVENTIL HFA;VENTOLIN HFA  Inhale 2 puffs into the lungs every 4 (four) hours as needed for wheezing or shortness of breath (((PLAN B))).     aspirin EC 81 MG tablet  Take 81 mg by mouth daily.     diltiazem 240 MG 24 hr capsule  Commonly known as:  CARTIA XT  Take 1 capsule (240 mg total) by mouth daily.     fluconazole 100 MG tablet  Commonly known as:  DIFLUCAN  Take 100 mg by mouth daily.     gabapentin 300 MG capsule  Commonly known as:  NEURONTIN  Take  300 mg by mouth. Does not take when on tramadol     HYDROcodone-acetaminophen 5-325 MG per tablet  Commonly known as:  NORCO/VICODIN  Take 1-2 tablets by mouth every 6 (six) hours as needed for moderate pain.     loperamide 2 MG tablet  Commonly known as:  IMODIUM A-D  Take 2 mg by mouth as needed for diarrhea or loose stools. Per box as needed for diarrhea     LORazepam 0.5 MG tablet  Commonly known as:  ATIVAN  Take 1 mg by mouth at bedtime.     Magnesium 500 MG Tabs  Take 500 mg by mouth daily.     mirtazapine 45 MG tablet  Commonly known as:  REMERON  Take 1 tablet (45 mg total) by mouth at bedtime.     mometasone-formoterol 100-5 MCG/ACT Aero  Commonly known as:  DULERA  Inhale 2 puffs into the lungs 2 (two) times daily.  montelukast 10 MG tablet  Commonly known as:  SINGULAIR  Take 10 mg by mouth daily.     omeprazole 40 MG capsule  Commonly known as:  PRILOSEC  Take 1 capsule (40 mg total) by mouth 2 (two) times daily.     oxybutynin 5 MG tablet  Commonly known as:  DITROPAN  Take 5 mg by mouth at bedtime.     QUEtiapine 400 MG tablet  Commonly known as:  SEROQUEL  Take 1 tablet (400 mg total) by mouth at bedtime.     tiotropium 18 MCG inhalation capsule  Commonly known as:  SPIRIVA  Place 18 mcg into inhaler and inhale daily.     traMADol 50 MG tablet  Commonly known as:  ULTRAM  Take 1 tablet (50 mg total) by mouth 3 (three) times daily. For pain     TUDORZA PRESSAIR 400 MCG/ACT Aepb  Generic drug:  Aclidinium Bromide  Take 1 puff by mouth daily.     vitamin B-12 1000 MCG tablet  Commonly known as:  CYANOCOBALAMIN  Take 1,000 mcg by mouth daily.     vitamin C 500 MG tablet  Commonly known as:  ASCORBIC ACID  Take 500 mg by mouth daily.       Allergies  Allergen Reactions  . Amoxapine And Related Nausea And Vomiting  . Amoxicillin     itching  . Ciprocinonide [Fluocinolone]     Pt unsure  . Ciprofloxacin     Pt unsure  . Doxycycline  Nausea And Vomiting  . Erythromycin Hives  . Sulfa Antibiotics Other (See Comments)    Unknown-reaction as a child  . Zofran [Ondansetron Hcl] Nausea And Vomiting       Follow-up Information   Follow up with Alesia Richards, MD. Schedule an appointment as soon as possible for a visit in 1 week.   Specialty:  Internal Medicine   Contact information:   [address] [place] [address] [phone]        The results of significant diagnostics from this hospitalization (including imaging, microbiology, ancillary and laboratory) are listed below for reference.    Significant Diagnostic Studies: Dg Chest 2 View  07/01/2013   CLINICAL DATA:  Weakness.  EXAM: CHEST  2 VIEW  COMPARISON:  May 08, 2013.  FINDINGS: Cardiomediastinal silhouette appears normal. Hyperexpansion of the lungs is noted consistent with chronic obstructive pulmonary disease. No pneumothorax or pleural effusion is noted. Calcified left axillary lymph node is noted. No acute pulmonary disease is noted.  IMPRESSION: Findings consistent with chronic obstructive pulmonary disease. No acute cardiopulmonary abnormality seen.   Electronically Signed   By: Sabino Dick M.D.   On: 07/01/2013 19:50   Ct Head Wo Contrast  07/01/2013   CLINICAL DATA:  Dizziness and fall  EXAM: CT HEAD WITHOUT CONTRAST  TECHNIQUE: Contiguous axial images were obtained from the base of the skull through the vertex without intravenous contrast.  COMPARISON:  05/10/2009  FINDINGS: Skull and Sinuses:No significant abnormality.  Orbits: Bilateral cataract resection  Brain: No evidence of acute abnormality, such as acute infarction, hemorrhage, hydrocephalus, or mass lesion/mass effect. Chronic small vessel disease with ischemic gliosis confluent around the frontal horns of the lateral ventricles. Remote cortical and subcortical infarct in the posterior left frontal lobe and left parietal lobe.  IMPRESSION: 1. No evidence of acute  intracranial disease. 2. Chronic small vessel disease and remote left frontal parietal infarct.   Electronically Signed   By: Gilford Silvius.D.  On: 07/01/2013 22:45   Mr Brain Wo Contrast  07/03/2013   CLINICAL DATA:  Gait ataxia. Lower extremity weakness and falls at home. Feeling dizzy and weak prior to fall. No loss of consciousness.  EXAM: MRI HEAD WITHOUT CONTRAST  TECHNIQUE: Multiplanar, multiecho pulse sequences of the brain and surrounding structures were obtained without intravenous contrast.  COMPARISON:  07/01/2013 CT.  03/10/2010 MR.  FINDINGS: No acute infarct.  Remote moderate size left parietal lobe infarct with encephalomalacia containing blood breakdown products.  Curvilinear artifact on FLAIR imaging suspected without acute intracranial hemorrhage noted.  Moderate small vessel disease type changes.  Global atrophy without hydrocephalus.  No intracranial mass lesion noted on this unenhanced exam.  Major intracranial vascular structures are patent.  Minimal mucosal thickening ethmoid sinus air cells.  Cervical medullary junction, pituitary region, pineal region orbital structures unremarkable.  IMPRESSION: No acute infarct.  Remote moderate size left parietal lobe infarct with encephalomalacia containing blood breakdown products.  Moderate small vessel disease type changes.  Global atrophy without hydrocephalus.   Electronically Signed   By: Chauncey Cruel M.D.   On: 07/03/2013 14:59    Microbiology: No results found for this or any previous visit (from the past 240 hour(s)).   Labs: Basic Metabolic Panel:  Recent Labs Lab 07/01/13 1841 07/02/13 0216 07/03/13 0402  NA 126* 134* 138  K 4.7 4.0 4.2  CL 90* 99 104  CO2 23 23 21   GLUCOSE 104* 118* 100*  BUN 25* 18 9  CREATININE 1.55* 1.18* 0.85  CALCIUM 8.4 8.4 7.8*  MG  --  2.0  --   PHOS  --  2.8  --    Liver Function Tests:  Recent Labs Lab 07/01/13 1841 07/02/13 0216  AST 21 19  ALT 13 12  ALKPHOS 71 64   BILITOT <0.2* <0.2*  PROT 5.7* 5.2*  ALBUMIN 3.1* 2.8*   No results found for this basename: LIPASE, AMYLASE,  in the last 168 hours No results found for this basename: AMMONIA,  in the last 168 hours CBC:  Recent Labs Lab 07/01/13 1841 07/02/13 0216 07/03/13 0402  WBC 9.6 8.4 7.7  NEUTROABS 6.2 4.4  --   HGB 11.3* 11.0* 10.9*  HCT 31.6* 31.7* 31.9*  MCV 88.5 90.3 90.6  PLT 232 221 228   Cardiac Enzymes:  Recent Labs Lab 07/01/13 1841  CKTOTAL 97  TROPONINI <0.30   BNP: BNP (last 3 results)  Recent Labs  02/10/13 1130  PROBNP 305.9*   CBG:  Recent Labs Lab 07/02/13 0614 07/03/13 0559  GLUCAP 163* 93       Signed:  Yoshie Kosel  [facility] 07/03/2013, 3:09 PM

## 2013-07-03 NOTE — Progress Notes (Signed)
Subjective: Patient continues to feel better today. Walked with PT and needed no assistance.   Objective: Current vital signs: BP 145/66  Pulse 85  Temp(Src) 97.8 F (36.6 C) (Oral)  Resp 18  Ht 5\' 1"  (1.549 m)  Wt 44.226 kg (97 lb 8 oz)  BMI 18.43 kg/m2  SpO2 99% Vital signs in last 24 hours: Temp:  [97.8 F (36.6 C)-98.4 F (36.9 C)] 97.8 F (36.6 C) (04/10 1423) Pulse Rate:  [81-88] 85 (04/10 1423) Resp:  [18] 18 (04/10 1423) BP: (142-152)/(64-66) 145/66 mmHg (04/10 1423) SpO2:  [96 %-100 %] 99 % (04/10 1423)  Intake/Output from previous day: 04/09 0701 - 04/10 0700 In: 1320 [P.O.:1320] Out: 1652 [Urine:1650; Stool:2] Intake/Output this shift: Total I/O In: 600 [P.O.:600] Out: 600 [Urine:600] Nutritional status: General  Neurologic Exam: Mental Status: Alert, oriented, thought content appropriate.  Speech fluent without evidence of aphasia.  Able to follow 3 step commands without difficulty. Cranial Nerves: II: Discs flat bilaterally; Visual fields grossly normal, pupils equal, round, reactive to light and accommodation III,IV, VI: ptosis not present, extra-ocular motions intact bilaterally V,VII: smile symmetric, facial light touch sensation normal bilaterally VIII: hearing normal bilaterally IX,X: gag reflex present XI: bilateral shoulder shrug XII: midline tongue extension without atrophy or fasciculations  Motor: Right : Upper extremity   5/5    Left:     Upper extremity   5/5  Lower extremity   5/5     Lower extremity   5/5 Tone and bulk:normal tone throughout; no atrophy noted Sensory: Pinprick and light touch intact throughout, bilaterally Deep Tendon Reflexes:  Right: Upper Extremity   Left: Upper extremity   biceps (C-5 to C-6) 2/4   biceps (C-5 to C-6) 2/4 tricep (C7) 2/4    triceps (C7) 2/4 Brachioradialis (C6) 2/4  Brachioradialis (C6) 2/4  Lower Extremity Lower Extremity  quadriceps (L-2 to L-4) 2/4   quadriceps (L-2 to L-4) 2/4 Achilles  (S1) 1/4   Achilles (S1) 1/4  Plantars: Right: downgoing   Left: downgoing Cerebellar: normal finger-to-nose,  normal heel-to-shin test    Lab Results: Basic Metabolic Panel:  Recent Labs Lab 07/01/13 1841 07/02/13 0216 07/03/13 0402  NA 126* 134* 138  K 4.7 4.0 4.2  CL 90* 99 104  CO2 23 23 21   GLUCOSE 104* 118* 100*  BUN 25* 18 9  CREATININE 1.55* 1.18* 0.85  CALCIUM 8.4 8.4 7.8*  MG  --  2.0  --   PHOS  --  2.8  --     Liver Function Tests:  Recent Labs Lab 07/01/13 1841 07/02/13 0216  AST 21 19  ALT 13 12  ALKPHOS 71 64  BILITOT <0.2* <0.2*  PROT 5.7* 5.2*  ALBUMIN 3.1* 2.8*   No results found for this basename: LIPASE, AMYLASE,  in the last 168 hours No results found for this basename: AMMONIA,  in the last 168 hours  CBC:  Recent Labs Lab 07/01/13 1841 07/02/13 0216 07/03/13 0402  WBC 9.6 8.4 7.7  NEUTROABS 6.2 4.4  --   HGB 11.3* 11.0* 10.9*  HCT 31.6* 31.7* 31.9*  MCV 88.5 90.3 90.6  PLT 232 221 228    Cardiac Enzymes:  Recent Labs Lab 07/01/13 1841  CKTOTAL 97  TROPONINI <0.30    Lipid Panel: No results found for this basename: CHOL, TRIG, HDL, CHOLHDL, VLDL, LDLCALC,  in the last 168 hours  CBG:  Recent Labs Lab 07/02/13 0614 07/03/13 0559  GLUCAP 163* 93    Microbiology:  Results for orders placed in visit on 06/16/13  URINE CULTURE     Status: None   Collection Time    06/16/13  2:10 PM      Result Value Ref Range Status   Colony Count NO GROWTH   Final   Organism ID, Bacteria NO GROWTH   Final    Coagulation Studies:  Recent Labs  07/02/13 0216  LABPROT 12.8  INR 0.98    Imaging: Dg Chest 2 View  07/01/2013   CLINICAL DATA:  Weakness.  EXAM: CHEST  2 VIEW  COMPARISON:  May 08, 2013.  FINDINGS: Cardiomediastinal silhouette appears normal. Hyperexpansion of the lungs is noted consistent with chronic obstructive pulmonary disease. No pneumothorax or pleural effusion is noted. Calcified left axillary  lymph node is noted. No acute pulmonary disease is noted.  IMPRESSION: Findings consistent with chronic obstructive pulmonary disease. No acute cardiopulmonary abnormality seen.   Electronically Signed   By: Sabino Dick M.D.   On: 07/01/2013 19:50   Ct Head Wo Contrast  07/01/2013   CLINICAL DATA:  Dizziness and fall  EXAM: CT HEAD WITHOUT CONTRAST  TECHNIQUE: Contiguous axial images were obtained from the base of the skull through the vertex without intravenous contrast.  COMPARISON:  05/10/2009  FINDINGS: Skull and Sinuses:No significant abnormality.  Orbits: Bilateral cataract resection  Brain: No evidence of acute abnormality, such as acute infarction, hemorrhage, hydrocephalus, or mass lesion/mass effect. Chronic small vessel disease with ischemic gliosis confluent around the frontal horns of the lateral ventricles. Remote cortical and subcortical infarct in the posterior left frontal lobe and left parietal lobe.  IMPRESSION: 1. No evidence of acute intracranial disease. 2. Chronic small vessel disease and remote left frontal parietal infarct.   Electronically Signed   By: Jorje Guild M.D.   On: 07/01/2013 22:45    Medications:  Scheduled: . aspirin EC  81 mg Oral Daily  . fluconazole  100 mg Oral Daily  . gabapentin  300 mg Oral BID  . LORazepam  1 mg Oral QHS  . magnesium oxide  400 mg Oral Daily  . mirtazapine  45 mg Oral QHS  . mometasone-formoterol  2 puff Inhalation BID  . montelukast  10 mg Oral Daily  . oxybutynin  5 mg Oral QHS  . pantoprazole  40 mg Oral Daily  . QUEtiapine  400 mg Oral QHS  . tiotropium  18 mcg Inhalation Daily  . traMADol  50 mg Oral TID  . vitamin B-12  1,000 mcg Oral Daily  . vitamin C  500 mg Oral Daily    Assessment/Plan: 68 year old female presenting with lower extremity weakness that has continued to improve.  She has walked in hall with PT without assisted device.  MRI brain has been done --final reading pending but no stroke seen on DWI.   Weakness likely due to dehydration and  Hyponatremia. Patient now feels much improved.  No further recommendations.  Neurology S/O     LOS: 2 days   Etta Quill PA-C [facility] [phone]  07/03/2013, 2:37 PM  07/03/2013  2:34 PM

## 2013-07-13 ENCOUNTER — Telehealth: Payer: Self-pay | Admitting: *Deleted

## 2013-07-13 NOTE — Telephone Encounter (Signed)
  Ankles are swelling every PM no matter what trys to prop them up but just continues to swell. Please Advise       [facility]

## 2013-07-14 ENCOUNTER — Encounter: Payer: Self-pay | Admitting: Podiatry

## 2013-07-14 ENCOUNTER — Ambulatory Visit (INDEPENDENT_AMBULATORY_CARE_PROVIDER_SITE_OTHER): Payer: Medicare Other | Admitting: Podiatry

## 2013-07-14 VITALS — BP 126/82 | HR 71 | Resp 16

## 2013-07-14 DIAGNOSIS — M2040 Other hammer toe(s) (acquired), unspecified foot: Secondary | ICD-10-CM | POA: Diagnosis not present

## 2013-07-14 DIAGNOSIS — M47814 Spondylosis without myelopathy or radiculopathy, thoracic region: Secondary | ICD-10-CM | POA: Diagnosis not present

## 2013-07-14 NOTE — Telephone Encounter (Signed)
Needs hospital follow up for swelling. If worse especially with SOB needs to go to the ER.

## 2013-07-14 NOTE — Progress Notes (Signed)
She presents today with chief complaint of painful fifth digit of the right foot and second digit of the left foot. These exhibited porokeratosis which was sharply debrided for her today no other complications of the bilateral foot was noted pulses remain palpable  Assessment: Porokeratosis bilateral foot.  Plan: Debridement of reactive hyperkeratosis.

## 2013-07-15 ENCOUNTER — Ambulatory Visit (INDEPENDENT_AMBULATORY_CARE_PROVIDER_SITE_OTHER): Payer: Medicare Other | Admitting: Internal Medicine

## 2013-07-15 ENCOUNTER — Encounter: Payer: Self-pay | Admitting: Internal Medicine

## 2013-07-15 VITALS — BP 104/52 | HR 84 | Temp 98.6°F | Resp 18 | Ht 61.5 in | Wt 94.8 lb

## 2013-07-15 DIAGNOSIS — I10 Essential (primary) hypertension: Secondary | ICD-10-CM

## 2013-07-15 DIAGNOSIS — R609 Edema, unspecified: Secondary | ICD-10-CM

## 2013-07-15 DIAGNOSIS — Z79899 Other long term (current) drug therapy: Secondary | ICD-10-CM | POA: Diagnosis not present

## 2013-07-15 LAB — CBC WITH DIFFERENTIAL/PLATELET
BASOS PCT: 0 % (ref 0–1)
Basophils Absolute: 0 10*3/uL (ref 0.0–0.1)
EOS ABS: 0 10*3/uL (ref 0.0–0.7)
Eosinophils Relative: 0 % (ref 0–5)
HCT: 33.6 % — ABNORMAL LOW (ref 36.0–46.0)
Hemoglobin: 11.8 g/dL — ABNORMAL LOW (ref 12.0–15.0)
Lymphocytes Relative: 9 % — ABNORMAL LOW (ref 12–46)
Lymphs Abs: 1 10*3/uL (ref 0.7–4.0)
MCH: 30.8 pg (ref 26.0–34.0)
MCHC: 35.1 g/dL (ref 30.0–36.0)
MCV: 87.7 fL (ref 78.0–100.0)
MONOS PCT: 11 % (ref 3–12)
Monocytes Absolute: 1.3 10*3/uL — ABNORMAL HIGH (ref 0.1–1.0)
NEUTROS PCT: 80 % — AB (ref 43–77)
Neutro Abs: 9.1 10*3/uL — ABNORMAL HIGH (ref 1.7–7.7)
PLATELETS: 475 10*3/uL — AB (ref 150–400)
RBC: 3.83 MIL/uL — AB (ref 3.87–5.11)
RDW: 13.9 % (ref 11.5–15.5)
WBC: 11.4 10*3/uL — ABNORMAL HIGH (ref 4.0–10.5)

## 2013-07-15 NOTE — Patient Instructions (Signed)
Stop Diltiazem / Cardizem  Monitor BP's & call if elevated over 145/90  Edema Edema is an abnormal build-up of fluids in tissues. Because this is partly dependent on gravity (water flows to the lowest place), it is more common in the legs and thighs (lower extremities). It is also common in the looser tissues, like around the eyes. Painless swelling of the feet and ankles is common and increases as a person ages. It may affect both legs and may include the calves or even thighs. When squeezed, the fluid may move out of the affected area and may leave a dent for a few moments. CAUSES   Prolonged standing or sitting in one place for extended periods of time. Movement helps pump tissue fluid into the veins, and absence of movement prevents this, resulting in edema.  Varicose veins. The valves in the veins do not work as well as they should. This causes fluid to leak into the tissues.  Fluid and salt overload.  Injury, burn, or surgery to the leg, ankle, or foot, may damage veins and allow fluid to leak out.  Sunburn damages vessels. Leaky vessels allow fluid to go out into the sunburned tissues.  Allergies (from insect bites or stings, medications or chemicals) cause swelling by allowing vessels to become leaky.  Protein in the blood helps keep fluid in your vessels. Low protein, as in malnutrition, allows fluid to leak out.  Hormonal changes, including pregnancy and menstruation, cause fluid retention. This fluid may leak out of vessels and cause edema.  Medications that cause fluid retention. Examples are sex hormones, blood pressure medications, steroid treatment, or anti-depressants.  Some illnesses cause edema, especially heart failure, kidney disease, or liver disease.  Surgery that cuts veins or lymph nodes, such as surgery done for the heart or for breast cancer, may result in edema. DIAGNOSIS  Your caregiver is usually easily able to determine what is causing your swelling (edema)  by simply asking what is wrong (getting a history) and examining you (doing a physical). Sometimes x-rays, EKG (electrocardiogram or heart tracing), and blood work may be done to evaluate for underlying medical illness. TREATMENT  General treatment includes:  Leg elevation (or elevation of the affected body part).  Restriction of fluid intake.  Prevention of fluid overload.  Compression of the affected body part. Compression with elastic bandages or support stockings squeezes the tissues, preventing fluid from entering and forcing it back into the blood vessels.  Diuretics (also called water pills or fluid pills) pull fluid out of your body in the form of increased urination. These are effective in reducing the swelling, but can have side effects and must be used only under your caregiver's supervision. Diuretics are appropriate only for some types of edema. The specific treatment can be directed at any underlying causes discovered. Heart, liver, or kidney disease should be treated appropriately. HOME CARE INSTRUCTIONS   Elevate the legs (or affected body part) above the level of the heart, while lying down.  Avoid sitting or standing still for prolonged periods of time.  Avoid putting anything directly under the knees when lying down, and do not wear constricting clothing or garters on the upper legs.  Exercising the legs causes the fluid to work back into the veins and lymphatic channels. This may help the swelling go down.  The pressure applied by elastic bandages or support stockings can help reduce ankle swelling.  A low-salt diet may help reduce fluid retention and decrease the ankle swelling.  Take any medications exactly as prescribed. SEEK MEDICAL CARE IF:  Your edema is not responding to recommended treatments. SEEK IMMEDIATE MEDICAL CARE IF:   You develop shortness of breath or chest pain.  You cannot breathe when you lay down; or if, while lying down, you have to get up  and go to the window to get your breath.  You are having increasing swelling without relief from treatment.  You develop a fever over 102 F (38.9 C).  You develop pain or redness in the areas that are swollen.  Tell your caregiver right away if you have gained 03 lb/1.4 kg in 1 day or 05 lb/2.3 kg in a week. MAKE SURE YOU:   Understand these instructions.  Will watch your condition.  Will get help right away if you are not doing well or get worse. Document Released: 03/12/2005 Document Revised: 09/11/2011 Document Reviewed: 10/29/2007 [facility] Patient Information 2014 [place].

## 2013-07-15 NOTE — Progress Notes (Deleted)
Patient ID: [name], female   DOB: Mar 11, 1946, 68 y.o.   MRN: [id number]

## 2013-07-15 NOTE — Progress Notes (Signed)
Subjective:    Patient ID: [name], female    DOB: [birth year], 68 y.o.   MRN: [id number]  HPI This very nice patient with multiple problems including HTN, COPD, ASHD, PreDM, OAB, DJD, and HLD presents for F/U after recent 2 day hospitalization for dehydration and treated with IVF with recovery. Since hospitalization patient has felt well.  Current Outpatient Prescriptions on File Prior to Visit  Medication Sig Dispense Refill  . albuterol (PROVENTIL HFA;VENTOLIN HFA) 108 (90 BASE) MCG/ACT inhaler Inhale 2 puffs into the lungs every 4 (four) hours as needed for wheezing or shortness of breath (((PLAN B))).      Marland Kitchen aspirin EC 81 MG tablet Take 81 mg by mouth daily.       Marland Kitchen diltiazem (CARTIA XT) 240 MG 24 hr capsule Take 1 capsule (240 mg total) by mouth daily.  90 capsule  2  . gabapentin (NEURONTIN) 300 MG capsule Take 300 mg by mouth. Does not take when on tramadol      . HYDROcodone-acetaminophen (NORCO/VICODIN) 5-325 MG per tablet Take 1-2 tablets by mouth every 6 (six) hours as needed for moderate pain.  20 tablet  0  . loperamide (IMODIUM A-D) 2 MG tablet Take 2 mg by mouth as needed for diarrhea or loose stools. Per box as needed for diarrhea      . LORazepam (ATIVAN) 0.5 MG tablet Take 1 mg by mouth at bedtime. Takes 1 in daytime and 2 at HS      . Magnesium 500 MG TABS Take 500 mg by mouth daily.       . mirtazapine (REMERON) 45 MG tablet Take 1 tablet (45 mg total) by mouth at bedtime.  90 tablet  99  . mometasone-formoterol (DULERA) 100-5 MCG/ACT AERO Inhale 2 puffs into the lungs 2 (two) times daily.  1 Inhaler  3  . montelukast (SINGULAIR) 10 MG tablet Take 10 mg by mouth daily.       Marland Kitchen omeprazole (PRILOSEC) 40 MG capsule Take 1 capsule (40 mg total) by mouth 2 (two) times daily.  60 capsule  3  . oxybutynin (DITROPAN) 5 MG tablet Take 5 mg by mouth at bedtime.       Marland Kitchen QUEtiapine (SEROQUEL) 400 MG tablet Take 1 tablet (400 mg total) by mouth at bedtime.  30 tablet  5  .  tiotropium (SPIRIVA) 18 MCG inhalation capsule Place 18 mcg into inhaler and inhale daily.      . traMADol (ULTRAM) 50 MG tablet Take 1 tablet (50 mg total) by mouth 3 (three) times daily. For pain  30 tablet  0  . TUDORZA PRESSAIR 400 MCG/ACT AEPB Take 1 puff by mouth daily.       . vitamin B-12 (CYANOCOBALAMIN) 1000 MCG tablet Take 1,000 mcg by mouth daily.      . vitamin C (ASCORBIC ACID) 500 MG tablet Take 500 mg by mouth daily.       No current facility-administered medications on file prior to visit.   Allergies  Allergen Reactions  . Amoxapine And Related Nausea And Vomiting  . Amoxicillin     itching  . Ciprocinonide [Fluocinolone]     Pt unsure  . Ciprofloxacin     Pt unsure  . Doxycycline Nausea And Vomiting  . Erythromycin Hives  . Sulfa Antibiotics Other (See Comments)    Unknown-reaction as a child  . Zofran [Ondansetron Hcl] Nausea And Vomiting   Past Medical History  Diagnosis Date  . Asthma   .  Hypertension   . GERD (gastroesophageal reflux disease)   . Stroke     was on Plavix - not now  . Carotid artery aneurysm 03/04/2012  . DJD (degenerative joint disease)   . COPD (chronic obstructive pulmonary disease)   . Depression   . Shortness of breath    Review of Systems  In addition to the HPI above,  No Fever-chills,  No Headache, No changes with Vision or hearing,  No problems swallowing food or Liquids,  No Chest pain or productive Cough, No Abdominal pain, No Nausea or Vommitting, Bowel movements are regular,  No Blood in stool or Urine,  No dysuria,  No new skin rashes or bruises,  No new joints pains-aches,  No new weakness, tingling, numbness in any extremity,  No recent weight loss,  No polyuria, polydypsia or polyphagia,  No significant Mental Stressors.  A full 10 point Review of Systems was done, except as stated above, all other Review of Systems were negative   Objective:   Physical Exam  BP 104/52  Pulse 84  Temp(Src) 98.6 F (37  C) (Temporal)  Resp 18  Ht 5' 1.5" (1.562 m)  Wt 94 lb 12.8 oz (43.001 kg)  BMI 17.62 kg/m2  HEENT - Eac's patent. TM's Nl.EOM's full. PERRLA. NasoOroPharynx clear. Neck - supple. Nl Thyroid. No bruits nodes JVD Chest - Clear diatant equal BS Cor - Nl HS. RRR w/o sig MGR. PP 1(+) with trace pedal  edema. Abd - No palpable organomegaly, masses or tenderness. BS nl. MS- FROM. w/o deformities. Muscle power tone and bulk Nl. Gait Nl. Neuro - No obvious Cr N abnormalities. Sensory, motor and Cerebellar functions appear Nl w/o focal abnormalities.  Assessment & Plan:   1. HTN (hypertension)  2. Edema  3. Encounter for long-term (current) use of other medications - CBC with Differential - BASIC METABOLIC PANEL WITH GFR

## 2013-07-16 ENCOUNTER — Other Ambulatory Visit: Payer: Self-pay | Admitting: Internal Medicine

## 2013-07-16 ENCOUNTER — Ambulatory Visit: Payer: Self-pay | Admitting: Internal Medicine

## 2013-07-16 LAB — BASIC METABOLIC PANEL WITH GFR
BUN: 18 mg/dL (ref 6–23)
CALCIUM: 9.2 mg/dL (ref 8.4–10.5)
CO2: 25 mEq/L (ref 19–32)
Chloride: 97 mEq/L (ref 96–112)
Creat: 1.23 mg/dL — ABNORMAL HIGH (ref 0.50–1.10)
GFR, EST AFRICAN AMERICAN: 52 mL/min — AB
GFR, Est Non African American: 45 mL/min — ABNORMAL LOW
GLUCOSE: 92 mg/dL (ref 70–99)
Potassium: 4.7 mEq/L (ref 3.5–5.3)
Sodium: 133 mEq/L — ABNORMAL LOW (ref 135–145)

## 2013-07-21 DIAGNOSIS — R061 Stridor: Secondary | ICD-10-CM | POA: Diagnosis not present

## 2013-07-21 DIAGNOSIS — J381 Polyp of vocal cord and larynx: Secondary | ICD-10-CM | POA: Diagnosis not present

## 2013-07-21 DIAGNOSIS — R490 Dysphonia: Secondary | ICD-10-CM | POA: Diagnosis not present

## 2013-07-22 ENCOUNTER — Telehealth: Payer: Self-pay | Admitting: *Deleted

## 2013-07-22 DIAGNOSIS — K219 Gastro-esophageal reflux disease without esophagitis: Secondary | ICD-10-CM

## 2013-07-22 MED ORDER — LORAZEPAM 0.5 MG PO TABS: 1.0000 mg | ORAL_TABLET | Freq: Every day | ORAL | Status: DC

## 2013-07-22 MED ORDER — OMEPRAZOLE 40 MG PO CPDR: 40.0000 mg | DELAYED_RELEASE_CAPSULE | Freq: Every day | ORAL | Status: DC

## 2013-07-22 NOTE — Telephone Encounter (Signed)
Refills=  Lorazepam = shes now on 1qam & 2qhs   Omeprazole 40mg  1 bid

## 2013-07-27 ENCOUNTER — Telehealth: Payer: Self-pay | Admitting: *Deleted

## 2013-07-27 NOTE — Telephone Encounter (Signed)
Pt said OMEPRAZOLE rx is very expensive with her ins. Asking if we can call in generic Nexium says her pharm told her it was less expensive?

## 2013-07-28 ENCOUNTER — Encounter: Payer: Self-pay | Admitting: Podiatry

## 2013-07-28 ENCOUNTER — Ambulatory Visit (INDEPENDENT_AMBULATORY_CARE_PROVIDER_SITE_OTHER): Payer: Medicare Other | Admitting: Podiatry

## 2013-07-28 VITALS — BP 134/77 | HR 89 | Resp 18 | Ht 62.0 in | Wt 105.0 lb

## 2013-07-28 DIAGNOSIS — B351 Tinea unguium: Secondary | ICD-10-CM

## 2013-07-28 DIAGNOSIS — M2040 Other hammer toe(s) (acquired), unspecified foot: Secondary | ICD-10-CM

## 2013-07-28 MED ORDER — ESOMEPRAZOLE MAGNESIUM 40 MG PO CPDR: 40.0000 mg | DELAYED_RELEASE_CAPSULE | Freq: Every day | ORAL | Status: DC

## 2013-07-28 NOTE — Progress Notes (Signed)
She presents today with a chief complaint of a painful fifth digit of the right foot.  Objective: Pulses remain palpable right foot. Hammertoe deformity fifth right resulting in lateral reactive hyperkeratosis fifth PIPJ right foot.  Assessment: Hammertoe deformity with porokeratosis fifth digit right foot.  Plan: Debridement of lesion today followup with her in 2 weeks.

## 2013-08-06 ENCOUNTER — Other Ambulatory Visit: Payer: Self-pay | Admitting: Internal Medicine

## 2013-08-07 ENCOUNTER — Other Ambulatory Visit: Payer: Self-pay | Admitting: Internal Medicine

## 2013-08-07 DIAGNOSIS — M1990 Unspecified osteoarthritis, unspecified site: Secondary | ICD-10-CM

## 2013-08-07 MED ORDER — TRAMADOL HCL 50 MG PO TABS: 50.0000 mg | ORAL_TABLET | Freq: Four times a day (QID) | ORAL | Status: DC | PRN

## 2013-08-13 ENCOUNTER — Ambulatory Visit: Payer: Medicare Other | Admitting: Podiatry

## 2013-08-18 ENCOUNTER — Encounter: Payer: Self-pay | Admitting: Internal Medicine

## 2013-08-18 ENCOUNTER — Ambulatory Visit (INDEPENDENT_AMBULATORY_CARE_PROVIDER_SITE_OTHER): Payer: Medicare Other | Admitting: Internal Medicine

## 2013-08-18 ENCOUNTER — Ambulatory Visit (INDEPENDENT_AMBULATORY_CARE_PROVIDER_SITE_OTHER): Payer: Medicare Other | Admitting: Podiatry

## 2013-08-18 ENCOUNTER — Ambulatory Visit: Payer: Medicare Other | Admitting: Podiatry

## 2013-08-18 ENCOUNTER — Ambulatory Visit: Payer: Self-pay | Admitting: Emergency Medicine

## 2013-08-18 ENCOUNTER — Encounter: Payer: Self-pay | Admitting: Podiatry

## 2013-08-18 VITALS — BP 118/70 | HR 88 | Temp 98.1°F | Resp 18 | Ht 64.5 in | Wt 92.2 lb

## 2013-08-18 VITALS — BP 150/85 | HR 63 | Resp 16

## 2013-08-18 DIAGNOSIS — Z79899 Other long term (current) drug therapy: Secondary | ICD-10-CM | POA: Diagnosis not present

## 2013-08-18 DIAGNOSIS — M778 Other enthesopathies, not elsewhere classified: Secondary | ICD-10-CM

## 2013-08-18 DIAGNOSIS — Z515 Encounter for palliative care: Secondary | ICD-10-CM | POA: Insufficient documentation

## 2013-08-18 DIAGNOSIS — I10 Essential (primary) hypertension: Secondary | ICD-10-CM | POA: Diagnosis not present

## 2013-08-18 DIAGNOSIS — N3000 Acute cystitis without hematuria: Secondary | ICD-10-CM

## 2013-08-18 DIAGNOSIS — E782 Mixed hyperlipidemia: Secondary | ICD-10-CM | POA: Insufficient documentation

## 2013-08-18 DIAGNOSIS — R7303 Prediabetes: Secondary | ICD-10-CM | POA: Insufficient documentation

## 2013-08-18 DIAGNOSIS — M779 Enthesopathy, unspecified: Principal | ICD-10-CM

## 2013-08-18 DIAGNOSIS — M7750 Other enthesopathy of unspecified foot: Secondary | ICD-10-CM

## 2013-08-18 DIAGNOSIS — E559 Vitamin D deficiency, unspecified: Secondary | ICD-10-CM

## 2013-08-18 DIAGNOSIS — R7309 Other abnormal glucose: Secondary | ICD-10-CM | POA: Diagnosis not present

## 2013-08-18 LAB — HEMOGLOBIN A1C
Hgb A1c MFr Bld: 5.5 % (ref <5.7)
MEAN PLASMA GLUCOSE: 111 mg/dL (ref <117)

## 2013-08-18 LAB — CBC WITH DIFFERENTIAL/PLATELET
BASOS PCT: 2 % — AB (ref 0–1)
Basophils Absolute: 0.1 10*3/uL (ref 0.0–0.1)
EOS ABS: 0.4 10*3/uL (ref 0.0–0.7)
EOS PCT: 6 % — AB (ref 0–5)
HCT: 37.7 % (ref 36.0–46.0)
Hemoglobin: 13 g/dL (ref 12.0–15.0)
Lymphocytes Relative: 40 % (ref 12–46)
Lymphs Abs: 2.8 10*3/uL (ref 0.7–4.0)
MCH: 30.4 pg (ref 26.0–34.0)
MCHC: 34.5 g/dL (ref 30.0–36.0)
MCV: 88.3 fL (ref 78.0–100.0)
Monocytes Absolute: 1 10*3/uL (ref 0.1–1.0)
Monocytes Relative: 14 % — ABNORMAL HIGH (ref 3–12)
Neutro Abs: 2.7 10*3/uL (ref 1.7–7.7)
Neutrophils Relative %: 38 % — ABNORMAL LOW (ref 43–77)
PLATELETS: 430 10*3/uL — AB (ref 150–400)
RBC: 4.27 MIL/uL (ref 3.87–5.11)
RDW: 14 % (ref 11.5–15.5)
WBC: 7 10*3/uL (ref 4.0–10.5)

## 2013-08-18 NOTE — Progress Notes (Signed)
Patient ID: [name], female   DOB: 10/17/1945, 68 y.o.   MRN: [id number]    This very nice 68 y.o. DWF presents for 3 month follow up with Hypertension, Hyperlipidemia, Pre-Diabetes and Vitamin D Deficiency.    HTN predates since 1998. BP has been controlled at home. Today's BP: 118/70 mmHg. Patient had a normal cardiolyte in 2004. She did have 2 non focal strokes in 2003 & 2004 which resolved w/o sequelae.  Patient denies any cardiac type chest pain, palpitations, dyspnea/orthopnea/PND, dizziness, claudication, or dependent edema.   Hyperlipidemia is controlled with diet & meds. Last lipids in Feb as below were at goal. Patient denies myalgias or other med SE's.  Lab Results  Component Value Date   CHOL 147 04/29/2013   HDL 83 04/29/2013   LDLCALC 45 04/29/2013   TRIG 97 04/29/2013   CHOLHDL 1.8 04/29/2013    Also, the patient has history of PreDiabetes with A1c 5.9% in Jan 2012 and last A1c was 5.85 in Feb 2015.  Patient denies any symptoms of reactive hypoglycemia, diabetic polys, paresthesias or visual blurring.   Further, Patient has history of Vitamin D Deficiency of 48 in 2013 and last vitamin D was 85 in July 2014. Patient supplements vitamin D without any suspected side-effects.   Medication List       albuterol 108 (90 BASE) MCG/ACT inhaler  Commonly known as:  PROVENTIL HFA;VENTOLIN HFA  Inhale 2 puffs into the lungs every 4 (four) hours as needed for wheezing or shortness of breath (((PLAN B))).     aspirin EC 81 MG tablet  Take 81 mg by mouth daily.     diltiazem 240 MG 24 hr capsule  Commonly known as:  CARTIA XT  Take 1 capsule (240 mg total) by mouth daily.     esomeprazole 40 MG capsule  Commonly known as:  NEXIUM  Take 1 capsule (40 mg total) by mouth daily.     gabapentin 300 MG capsule  Commonly known as:  NEURONTIN  Take 300 mg by mouth. Does not take when on tramadol     HYDROcodone-acetaminophen 5-325 MG per tablet  Commonly known as:  NORCO/VICODIN  Take  1-2 tablets by mouth every 6 (six) hours as needed for moderate pain.     loperamide 2 MG tablet  Commonly known as:  IMODIUM A-D  Take 2 mg by mouth as needed for diarrhea or loose stools. Per box as needed for diarrhea     LORazepam 0.5 MG tablet  Commonly known as:  ATIVAN  Take 2 tablets (1 mg total) by mouth at bedtime. Takes 1 in daytime and 2 at HS     Magnesium 500 MG Tabs  Take 500 mg by mouth daily.     mirtazapine 45 MG tablet  Commonly known as:  REMERON  Take 1 tablet (45 mg total) by mouth at bedtime.     mometasone-formoterol 100-5 MCG/ACT Aero  Commonly known as:  DULERA  Inhale 2 puffs into the lungs 2 (two) times daily.     montelukast 10 MG tablet  Commonly known as:  SINGULAIR  Take 10 mg by mouth daily.     oxybutynin 5 MG tablet  Commonly known as:  DITROPAN  Take 5 mg by mouth at bedtime.     QUEtiapine 400 MG tablet  Commonly known as:  SEROQUEL  Take 1 tablet (400 mg total) by mouth at bedtime.     quinapril 40 MG tablet  Commonly known as:  ACCUPRIL  TAKE 1 TABLET BY MOUTH AT BEDTIME     tiotropium 18 MCG inhalation capsule  Commonly known as:  SPIRIVA  Place 18 mcg into inhaler and inhale daily.     traMADol 50 MG tablet  Commonly known as:  ULTRAM  Take 1 tablet (50 mg total) by mouth every 6 (six) hours as needed.     TUDORZA PRESSAIR 400 MCG/ACT Aepb  Generic drug:  Aclidinium Bromide  Take 1 puff by mouth daily.     vitamin B-12 1000 MCG tablet  Commonly known as:  CYANOCOBALAMIN  Take 1,000 mcg by mouth daily.     vitamin C 500 MG tablet  Commonly known as:  ASCORBIC ACID  Take 500 mg by mouth daily.       Allergies  Allergen Reactions  . Amoxapine And Related Nausea And Vomiting  . Amoxicillin     itching  . Ciprocinonide [Fluocinolone]     Pt unsure  . Ciprofloxacin     Pt unsure  . Doxycycline Nausea And Vomiting  . Erythromycin Hives  . Sulfa Antibiotics Other (See Comments)    Unknown-reaction as a child  .  Zofran [Ondansetron Hcl] Nausea And Vomiting   PMHx:   Past Medical History  Diagnosis Date  . Asthma   . Hypertension   . GERD (gastroesophageal reflux disease)   . Stroke     was on Plavix - not now  . Carotid artery aneurysm 03/04/2012  . DJD (degenerative joint disease)   . COPD (chronic obstructive pulmonary disease)   . Depression   . Shortness of breath    FHx:    Reviewed / unchanged  SHx:    Reviewed / unchanged   Systems Review: Constitutional: Denies fever, chills, wt changes, headaches, insomnia, fatigue, night sweats, change in appetite. Eyes: Denies redness, blurred vision, diplopia, discharge, itchy, watery eyes.  ENT: Denies discharge, congestion, post nasal drip, epistaxis, sore throat, earache, hearing loss, dental pain, tinnitus, vertigo, sinus pain, snoring.  CV: Denies chest pain, palpitations, irregular heartbeat, syncope, dyspnea, diaphoresis, orthopnea, PND, claudication or edema. Respiratory: denies cough, dyspnea, DOE, pleurisy, hoarseness, laryngitis, wheezing.  Gastrointestinal: Denies dysphagia, odynophagia, heartburn, reflux, water brash, abdominal pain or cramps, nausea, vomiting, bloating, diarrhea, hematemesis, melena, hematochezia  or hemorrhoids. Does c/o constipation today and supra-pubic cramping discomfort. Genitourinary: Denies dysuria, frequency, urgency, nocturia, hesitancy, discharge, hematuria or flank pain. Musculoskeletal: Denies arthralgias, myalgias, stiffness, jt. swelling, pain, limping or strain/sprain.  Skin: Denies pruritus, rash, hives, warts, acne, eczema or change in skin lesion(s). Neuro: No weakness, tremor, incoordination, spasms, paresthesia or pain. Psychiatric: Denies confusion, memory loss or sensory loss. Endo: Denies change in weight, skin or hair change.  Heme/Lymph: No excessive bleeding, bruising or enlarged lymph nodes.  Exam:  BP 118/70  Pulse 88  Temp 98.1 F  Resp 18  Ht 5' 4.5"   Wt 92 lb 3.2 oz   BMI  15.59 kg/m2  Appears well nourished - in no distress. Eyes: PERRLA, EOMs, conjunctiva no swelling or erythema. Sinuses: No frontal/maxillary tenderness ENT/Mouth: EAC's clear, TM's nl w/o erythema, bulging. Nares clear w/o erythema, swelling, exudates. Oropharynx clear without erythema or exudates. Oral hygiene is good. Tongue normal, non obstructing. Hearing intact.  Neck: Supple. Thyroid nl. Car 2+/2+ without bruits, nodes or JVD. Chest: Respirations nl with BS clear & equal w/o rales, rhonchi, wheezing or stridor.  Cor: Heart sounds normal w/ regular rate and rhythm without sig. murmurs, gallops, clicks, or rubs. Peripheral pulses normal  and equal  without edema.  Abdomen: Soft & bowel sounds normal. Non-tender w/o guarding, rebound, hernias, masses, or organomegaly.  Lymphatics: Unremarkable.  Musculoskeletal: Full ROM all peripheral extremities, joint stability, 5/5 strength, and normal gait.  Skin: Warm, dry without exposed rashes, lesions or ecchymosis apparent.  Neuro: Cranial nerves intact, reflexes equal bilaterally. Sensory-motor testing grossly intact. Tendon reflexes grossly intact.  Pysch: Alert & oriented x 3. Insight and judgement nl & appropriate. No ideations.  Assessment and Plan:  1. Hypertension - Continue monitor blood pressure at home. Continue diet/meds same.  2. Hyperlipidemia - Continue diet/meds, exercise,& lifestyle modifications. Continue monitor periodic cholesterol/liver & renal functions   3. Pre-diabetes - Continue diet, exercise, lifestyle modifications. Monitor appropriate labs.  4. Vitamin D Deficiency - Continue supplementation.  5. COPD (Gold 3)  6. CKD Stage 3   Recommended regular exercise, BP monitoring, weight control, and discussed med and SE's. Recommended labs to assess and monitor clinical status. Further disposition pending results of labs.

## 2013-08-18 NOTE — Progress Notes (Signed)
She presents today with a chief complaint of pain to the fifth digit of the right foot.  Objective: Pulses are palpable right. She has pain on palpation to the PIPJ of the fifth digit of the right foot.  Assessment: Capsulitis digit right foot.  Plan: Injected a small amount dexamethasone a total 2 mg to the toe fifth digit right foot. I also debrided her nails and debridement all reactive hyperkeratosis for her.

## 2013-08-18 NOTE — Patient Instructions (Signed)
Chronic Obstructive Pulmonary Disease Chronic obstructive pulmonary disease (COPD) is a common lung condition in which airflow from the lungs is limited. COPD is a general term that can be used to describe many different lung problems that limit airflow, including both chronic bronchitis and emphysema. If you have COPD, your lung function will probably never return to normal, but there are measures you can take to improve lung function and make yourself feel better.  CAUSES   Smoking (common).   Exposure to secondhand smoke.   Genetic problems.  Chronic inflammatory lung diseases or recurrent infections. SYMPTOMS   Shortness of breath, especially with physical activity.   Deep, persistent (chronic) cough with a large amount of thick mucus.   Wheezing.   Rapid breaths (tachypnea).   Gray or bluish discoloration (cyanosis) of the skin, especially in fingers, toes, or lips.   Fatigue.   Weight loss.   Frequent infections or episodes when breathing symptoms become much worse (exacerbations).   Chest tightness. DIAGNOSIS  Your healthcare provider will take a medical history and perform a physical examination to make the initial diagnosis. Additional tests for COPD may include:   Lung (pulmonary) function tests.  Chest X-ray.  CT scan.  Blood tests. TREATMENT  Treatment available to help you feel better when you have COPD include:   Inhaler and nebulizer medicines. These help manage the symptoms of COPD and make your breathing more comfortable  Supplemental oxygen. Supplemental oxygen is only helpful if you have a low oxygen level in your blood.   Exercise and physical activity. These are beneficial for nearly all people with COPD. Some people may also benefit from a pulmonary rehabilitation program. HOME CARE INSTRUCTIONS   Take all medicines (inhaled or pills) as directed by your health care provider.  Only take over-the-counter or prescription medicines  for pain, fever, or discomfort as directed by your health care provider.   Avoid over-the-counter medicines or cough syrups that dry up your airway (such as antihistamines) and slow down the elimination of secretions unless instructed otherwise by your healthcare provider.   If you are a smoker, the most important thing that you can do is stop smoking. Continuing to smoke will cause further lung damage and breathing trouble. Ask your health care provider for help with quitting smoking. He or she can direct you to community resources or hospitals that provide support.  Avoid exposure to irritants such as smoke, chemicals, and fumes that aggravate your breathing.  Use oxygen therapy and pulmonary rehabilitation if directed by your health care provider. If you require home oxygen therapy, ask your healthcare provider whether you should purchase a pulse oximeter to measure your oxygen level at home.   Avoid contact with individuals who have a contagious illness.  Avoid extreme temperature and humidity changes.  Eat healthy foods. Eating smaller, more frequent meals and resting before meals may help you maintain your strength.  Stay active, but balance activity with periods of rest. Exercise and physical activity will help you maintain your ability to do things you want to do.  Preventing infection and hospitalization is very important when you have COPD. Make sure to receive all the vaccines your health care provider recommends, especially the pneumococcal and influenza vaccines. Ask your healthcare provider whether you need a pneumonia vaccine.  Learn and use relaxation techniques to manage stress.  Learn and use controlled breathing techniques as directed by your health care provider. Controlled breathing techniques include:   Pursed lip breathing. Start by breathing   in (inhaling) through your nose for 1 second. Then, purse your lips as if you were going to whistle and breathe out (exhale)  through the pursed lips for 2 seconds.   Diaphragmatic breathing. Start by putting one hand on your abdomen just above your waist. Inhale slowly through your nose. The hand on your abdomen should move out. Then purse your lips and exhale slowly. You should be able to feel the hand on your abdomen moving in as you exhale.   Learn and use controlled coughing to clear mucus from your lungs. Controlled coughing is a series of short, progressive coughs. The steps of controlled coughing are:  1. Lean your head slightly forward.  2. Breathe in deeply using diaphragmatic breathing.  3. Try to hold your breath for 3 seconds.  4. Keep your mouth slightly open while coughing twice.  5. Spit any mucus out into a tissue.  6. Rest and repeat the steps once or twice as needed. SEEK MEDICAL CARE IF:   You are coughing up more mucus than usual.   There is a change in the color or thickness of your mucus.   Your breathing is more labored than usual.   Your breathing is faster than usual.  SEEK IMMEDIATE MEDICAL CARE IF:   You have shortness of breath while you are resting.   You have shortness of breath that prevents you from:  Being able to talk.   Performing your usual physical activities.   You have chest pain lasting longer than 5 minutes.   Your skin color is more cyanotic than usual.  You measure low oxygen saturations for longer than 5 minutes with a pulse oximeter. MAKE SURE YOU:   Understand these instructions.  Will watch your condition.  Will get help right away if you are not doing well or get worse. Document Released: 12/20/2004 Document Revised: 12/31/2012 Document Reviewed: 11/06/2012 [employer] Patient Information 2014 [employer]   Hypertension As your heart beats, it forces blood through your arteries. This force is your blood pressure. If the pressure is too high, it is called hypertension (HTN) or high blood pressure. HTN is dangerous because you  may have it and not know it. High blood pressure may mean that your heart has to work harder to pump blood. Your arteries may be narrow or stiff. The extra work puts you at risk for heart disease, stroke, and other problems.  Blood pressure consists of two numbers, a higher number over a lower, 110/72, for example. It is stated as "110 over 72." The ideal is below 120 for the top number (systolic) and under 80 for the bottom (diastolic). Write down your blood pressure today. You should pay close attention to your blood pressure if you have certain conditions such as:  Heart failure.  Prior heart attack.  Diabetes  Chronic kidney disease.  Prior stroke.  Multiple risk factors for heart disease. To see if you have HTN, your blood pressure should be measured while you are seated with your arm held at the level of the heart. It should be measured at least twice. A one-time elevated blood pressure reading (especially in the Emergency Department) does not mean that you need treatment. There may be conditions in which the blood pressure is different between your right and left arms. It is important to see your caregiver soon for a recheck. Most people have essential hypertension which means that there is not a specific cause. This type of high blood pressure may be   lowered by changing lifestyle factors such as:  Stress.  Smoking.  Lack of exercise.  Excessive weight.  Drug/tobacco/alcohol use.  Eating less salt. Most people do not have symptoms from high blood pressure until it has caused damage to the body. Effective treatment can often prevent, delay or reduce that damage. TREATMENT  When a cause has been identified, treatment for high blood pressure is directed at the cause. There are a large number of medications to treat HTN. These fall into several categories, and your caregiver will help you select the medicines that are best for you. Medications may have side effects. You should  review side effects with your caregiver. If your blood pressure stays high after you have made lifestyle changes or started on medicines,   Your medication(s) may need to be changed.  Other problems may need to be addressed.  Be certain you understand your prescriptions, and know how and when to take your medicine.  Be sure to follow up with your caregiver within the time frame advised (usually within two weeks) to have your blood pressure rechecked and to review your medications.  If you are taking more than one medicine to lower your blood pressure, make sure you know how and at what times they should be taken. Taking two medicines at the same time can result in blood pressure that is too low. SEEK IMMEDIATE MEDICAL CARE IF:  You develop a severe headache, blurred or changing vision, or confusion.  You have unusual weakness or numbness, or a faint feeling.  You have severe chest or abdominal pain, vomiting, or breathing problems. MAKE SURE YOU:   Understand these instructions.  Will watch your condition.  Will get help right away if you are not doing well or get worse.   Diabetes and Exercise Exercising regularly is important. It is not just about losing weight. It has many health benefits, such as:  Improving your overall fitness, flexibility, and endurance.  Increasing your bone density.  Helping with weight control.  Decreasing your body fat.  Increasing your muscle strength.  Reducing stress and tension.  Improving your overall health. People with diabetes who exercise gain additional benefits because exercise:  Reduces appetite.  Improves the body's use of blood sugar (glucose).  Helps lower or control blood glucose.  Decreases blood pressure.  Helps control blood lipids (such as cholesterol and triglycerides).  Improves the body's use of the hormone insulin by:  Increasing the body's insulin sensitivity.  Reducing the body's insulin  needs.  Decreases the risk for heart disease because exercising:  Lowers cholesterol and triglycerides levels.  Increases the levels of good cholesterol (such as high-density lipoproteins [HDL]) in the body.  Lowers blood glucose levels. YOUR ACTIVITY PLAN  Choose an activity that you enjoy and set realistic goals. Your health care provider or diabetes educator can help you make an activity plan that works for you. You can break activities into 2 or 3 sessions throughout the day. Doing so is as good as one long session. Exercise ideas include:  Taking the dog for a walk.  Taking the stairs instead of the elevator.  Dancing to your favorite song.  Doing your favorite exercise with a friend. RECOMMENDATIONS FOR EXERCISING WITH TYPE 1 OR TYPE 2 DIABETES   Check your blood glucose before exercising. If blood glucose levels are greater than 240 mg/dL, check for urine ketones. Do not exercise if ketones are present.  Avoid injecting insulin into areas of the body that are going   to be exercised. For example, avoid injecting insulin into:  The arms when playing tennis.  The legs when jogging.  Keep a record of:  Food intake before and after you exercise.  Expected peak times of insulin action.  Blood glucose levels before and after you exercise.  The type and amount of exercise you have done.  Review your records with your health care provider. Your health care provider will help you to develop guidelines for adjusting food intake and insulin amounts before and after exercising.  If you take insulin or oral hypoglycemic agents, watch for signs and symptoms of hypoglycemia. They include:  Dizziness.  Shaking.  Sweating.  Chills.  Confusion.  Drink plenty of water while you exercise to prevent dehydration or heat stroke. Body water is lost during exercise and must be replaced.  Talk to your health care provider before starting an exercise program to make sure it is safe  for you. Remember, almost any type of activity is better than none.    Cholesterol Cholesterol is a white, waxy, fat-like protein needed by your body in small amounts. The liver makes all the cholesterol you need. It is carried from the liver by the blood through the blood vessels. Deposits (plaque) may build up on blood vessel walls. This makes the arteries narrower and stiffer. Plaque increases the risk for heart attack and stroke. You cannot feel your cholesterol level even if it is very high. The only way to know is by a blood test to check your lipid (fats) levels. Once you know your cholesterol levels, you should keep a record of the test results. Work with your caregiver to to keep your levels in the desired range. WHAT THE RESULTS MEAN:  Total cholesterol is a rough measure of all the cholesterol in your blood.  LDL is the so-called bad cholesterol. This is the type that deposits cholesterol in the walls of the arteries. You want this level to be low.  HDL is the good cholesterol because it cleans the arteries and carries the LDL away. You want this level to be high.  Triglycerides are fat that the body can either burn for energy or store. High levels are closely linked to heart disease. DESIRED LEVELS:  Total cholesterol below 200.  LDL below 100 for people at risk, below 70 for very high risk.  HDL above 50 is good, above 60 is best.  Triglycerides below 150. HOW TO LOWER YOUR CHOLESTEROL:  Diet.  Choose fish or white meat chicken and turkey, roasted or baked. Limit fatty cuts of red meat, fried foods, and processed meats, such as sausage and lunch meat.  Eat lots of fresh fruits and vegetables. Choose whole grains, beans, pasta, potatoes and cereals.  Use only small amounts of olive, corn or canola oils. Avoid butter, mayonnaise, shortening or palm kernel oils. Avoid foods with trans-fats.  Use skim/nonfat milk and low-fat/nonfat yogurt and cheeses. Avoid whole milk,  cream, ice cream, egg yolks and cheeses. Healthy desserts include angel food cake, ginger snaps, animal crackers, hard candy, popsicles, and low-fat/nonfat frozen yogurt. Avoid pastries, cakes, pies and cookies.  Exercise.  A regular program helps decrease LDL and raises HDL.  Helps with weight control.  Do things that increase your activity level like gardening, walking, or taking the stairs.  Medication.  May be prescribed by your caregiver to help lowering cholesterol and the risk for heart disease.  You may need medicine even if your levels are normal if you have   several risk factors. HOME CARE INSTRUCTIONS  7. Follow your diet and exercise programs as suggested by your caregiver. 8. Take medications as directed. 9. Have blood work done when your caregiver feels it is necessary. MAKE SURE YOU:   Understand these instructions.  Will watch your condition.  Will get help right away if you are not doing well or get worse.      Vitamin D Deficiency Vitamin D is an important vitamin that your body needs. Having too little of it in your body is called a deficiency. A very bad deficiency can make your bones soft and can cause a condition called rickets.  Vitamin D is important to your body for different reasons, such as:   It helps your body absorb 2 minerals called calcium and phosphorus.  It helps make your bones healthy.  It may prevent some diseases, such as diabetes and multiple sclerosis.  It helps your muscles and heart. You can get vitamin D in several ways. It is a natural part of some foods. The vitamin is also added to some dairy products and cereals. Some people take vitamin D supplements. Also, your body makes vitamin D when you are in the sun. It changes the sun's rays into a form of the vitamin that your body can use. CAUSES   Not eating enough foods that contain vitamin D.  Not getting enough sunlight.  Having certain digestive system diseases that make it  hard to absorb vitamin D. These diseases include Crohn's disease, chronic pancreatitis, and cystic fibrosis.  Having a surgery in which part of the stomach or small intestine is removed.  Being obese. Fat cells pull vitamin D out of your blood. That means that obese people may not have enough vitamin D left in their blood and in other body tissues.  Having chronic kidney or liver disease. RISK FACTORS Risk factors are things that make you more likely to develop a vitamin D deficiency. They include:  Being older.  Not being able to get outside very much.  Living in a nursing home.  Having had broken bones.  Having weak or thin bones (osteoporosis).  Having a disease or condition that changes how your body absorbs vitamin D.  Having dark skin.  Some medicines such as seizure medicines or steroids.  Being overweight or obese. SYMPTOMS Mild cases of vitamin D deficiency may not have any symptoms. If you have a very bad case, symptoms may include:  Bone pain.  Muscle pain.  Falling often.  Broken bones caused by a minor injury, due to osteoporosis. DIAGNOSIS A blood test is the best way to tell if you have a vitamin D deficiency. TREATMENT Vitamin D deficiency can be treated in different ways. Treatment for vitamin D deficiency depends on what is causing it. Options include:  Taking vitamin D supplements.  Taking a calcium supplement. Your caregiver will suggest what dose is best for you. HOME CARE INSTRUCTIONS  Take any supplements that your caregiver prescribes. Follow the directions carefully. Take only the suggested amount.  Have your blood tested 2 months after you start taking supplements.  Eat foods that contain vitamin D. Healthy choices include:  Fortified dairy products, cereals, or juices. Fortified means vitamin D has been added to the food. Check the label on the package to be sure.  Fatty fish like salmon or trout.  Eggs.  Oysters.  Do not use a  tanning bed.  Keep your weight at a healthy level. Lose weight if you   need to.  Keep all follow-up appointments. Your caregiver will need to perform blood tests to make sure your vitamin D deficiency is going away. SEEK MEDICAL CARE IF:  You have any questions about your treatment.  You continue to have symptoms of vitamin D deficiency.  You have nausea or vomiting.  You are constipated.  You feel confused.  You have severe abdominal or back pain. MAKE SURE YOU:  Understand these instructions.  Will watch your condition.  Will get help right away if you are not doing well or get worse.   

## 2013-08-19 LAB — BASIC METABOLIC PANEL WITH GFR
BUN: 9 mg/dL (ref 6–23)
CALCIUM: 9.4 mg/dL (ref 8.4–10.5)
CO2: 27 mEq/L (ref 19–32)
CREATININE: 0.9 mg/dL (ref 0.50–1.10)
Chloride: 95 mEq/L — ABNORMAL LOW (ref 96–112)
GFR, EST AFRICAN AMERICAN: 75 mL/min
GFR, EST NON AFRICAN AMERICAN: 65 mL/min
Glucose, Bld: 56 mg/dL — ABNORMAL LOW (ref 70–99)
Potassium: 4.4 mEq/L (ref 3.5–5.3)
Sodium: 132 mEq/L — ABNORMAL LOW (ref 135–145)

## 2013-08-19 LAB — HEPATIC FUNCTION PANEL
ALBUMIN: 4 g/dL (ref 3.5–5.2)
ALT: <8 U/L (ref 0–35)
AST: 18 U/L (ref 0–37)
Alkaline Phosphatase: 73 U/L (ref 39–117)
BILIRUBIN TOTAL: 0.3 mg/dL (ref 0.2–1.2)
Bilirubin, Direct: 0.1 mg/dL (ref 0.0–0.3)
Indirect Bilirubin: 0.2 mg/dL (ref 0.2–1.2)
Total Protein: 6.4 g/dL (ref 6.0–8.3)

## 2013-08-19 LAB — MAGNESIUM: MAGNESIUM: 2 mg/dL (ref 1.5–2.5)

## 2013-08-19 LAB — LIPID PANEL
CHOL/HDL RATIO: 2.1 ratio
CHOLESTEROL: 132 mg/dL (ref 0–200)
HDL: 64 mg/dL (ref >39)
LDL CALC: 50 mg/dL (ref 0–99)
Triglycerides: 90 mg/dL (ref <150)
VLDL: 18 mg/dL (ref 0–40)

## 2013-08-19 LAB — TSH: TSH: 1.473 u[IU]/mL (ref 0.350–4.500)

## 2013-08-19 LAB — URINALYSIS, MICROSCOPIC ONLY
BACTERIA UA: NONE SEEN
Casts: NONE SEEN
Crystals: NONE SEEN
SQUAMOUS EPITHELIAL / LPF: NONE SEEN

## 2013-08-19 LAB — INSULIN, FASTING: Insulin fasting, serum: 6 u[IU]/mL (ref 3–28)

## 2013-08-19 LAB — VITAMIN D 25 HYDROXY (VIT D DEFICIENCY, FRACTURES): Vit D, 25-Hydroxy: 40 ng/mL (ref 30–89)

## 2013-08-19 LAB — URINE CULTURE
Colony Count: NO GROWTH
Organism ID, Bacteria: NO GROWTH

## 2013-09-01 ENCOUNTER — Ambulatory Visit (INDEPENDENT_AMBULATORY_CARE_PROVIDER_SITE_OTHER): Payer: Medicare Other | Admitting: Podiatry

## 2013-09-01 VITALS — BP 132/77 | HR 86 | Resp 17 | Ht 61.0 in | Wt 95.0 lb

## 2013-09-01 DIAGNOSIS — M2040 Other hammer toe(s) (acquired), unspecified foot: Secondary | ICD-10-CM

## 2013-09-01 NOTE — Progress Notes (Signed)
She presents today complaining of pain to the fifth digit of the second digit and ingrown toenail to the fibular border of the hallux right.  Objective: Pulses remain palpable to the right foot. Fifth digit does have her reactive hyperkeratotic lesion to the plantar lateral aspect of the toe that it feels much better than it did because of the injection that we provided last time she states. The medial aspect of the second toe does demonstrates some reactive hyperkeratosis and she does have a mildly incurvated nail which we debrided today along the fibular border of the hallux right without complications.  Assessment: Painful reactive hyperkeratosis and hammertoe deformity fifth right second right. Ingrown toenail fibular border hallux right.  Plan: Debridement of reactive hyperkeratosis today. Debridement of nail border for her today. Followup with her in 2 weeks

## 2013-09-07 DIAGNOSIS — M549 Dorsalgia, unspecified: Secondary | ICD-10-CM | POA: Diagnosis not present

## 2013-09-08 ENCOUNTER — Ambulatory Visit: Payer: Medicare Other | Admitting: Podiatry

## 2013-09-15 ENCOUNTER — Ambulatory Visit: Payer: Medicare Other | Admitting: Podiatry

## 2013-09-22 ENCOUNTER — Ambulatory Visit: Payer: Medicare Other | Admitting: Podiatry

## 2013-09-29 ENCOUNTER — Encounter: Payer: Self-pay | Admitting: Podiatry

## 2013-09-29 ENCOUNTER — Ambulatory Visit (INDEPENDENT_AMBULATORY_CARE_PROVIDER_SITE_OTHER): Payer: Medicare Other | Admitting: Podiatry

## 2013-09-29 VITALS — BP 106/65 | HR 81 | Resp 16

## 2013-09-29 DIAGNOSIS — B351 Tinea unguium: Secondary | ICD-10-CM

## 2013-09-29 DIAGNOSIS — M79609 Pain in unspecified limb: Secondary | ICD-10-CM

## 2013-09-29 DIAGNOSIS — M79676 Pain in unspecified toe(s): Secondary | ICD-10-CM

## 2013-09-29 NOTE — Progress Notes (Signed)
She presents today with a chief complaint of painful elongated toenails.  Objective: Pulses are palpable bilateral. Thick yellow dystrophic onychomycotic and painful palpation.  Assessment: Pain in limb secondary to onychomycosis 1 through 5 bilateral.  Plan: Debridement all reactive hyperkeratosis bilateral.

## 2013-10-02 ENCOUNTER — Telehealth: Payer: Self-pay | Admitting: *Deleted

## 2013-10-02 NOTE — Telephone Encounter (Signed)
Patient called and states she can not find her refill of her Lorazepam.  I called her pharmacy,[facility], and they had on the controlled substance log that patient picked up her RX at 7 :39 PM on 09/24/2013.  Patient states she has looked and can not find RX anywhere.  Patient will continue to look for RX over the weekend and will call back if she does not find.  Melissa Smith,PA aware.

## 2013-10-05 DIAGNOSIS — M549 Dorsalgia, unspecified: Secondary | ICD-10-CM | POA: Diagnosis not present

## 2013-10-05 DIAGNOSIS — F17200 Nicotine dependence, unspecified, uncomplicated: Secondary | ICD-10-CM | POA: Diagnosis not present

## 2013-10-06 ENCOUNTER — Encounter: Payer: Self-pay | Admitting: Internal Medicine

## 2013-10-06 ENCOUNTER — Ambulatory Visit (INDEPENDENT_AMBULATORY_CARE_PROVIDER_SITE_OTHER): Payer: Medicare Other | Admitting: Internal Medicine

## 2013-10-06 VITALS — BP 126/64 | HR 88 | Temp 99.5°F | Resp 18 | Ht 61.5 in | Wt 102.2 lb

## 2013-10-06 DIAGNOSIS — M1990 Unspecified osteoarthritis, unspecified site: Secondary | ICD-10-CM | POA: Diagnosis not present

## 2013-10-06 DIAGNOSIS — F4323 Adjustment disorder with mixed anxiety and depressed mood: Secondary | ICD-10-CM

## 2013-10-06 MED ORDER — HYDROXYZINE HCL 25 MG PO TABS: ORAL_TABLET | ORAL | Status: DC

## 2013-10-06 NOTE — Progress Notes (Signed)
Subjective:    Patient ID: [name], female    DOB: 1945-12-16, 68 y.o.   MRN: [id number]  Hand Pain  The incident occurred 3 to 5 days ago. The incident occurred at home. There was no injury mechanism. The pain is present in the right fingers and right hand. The quality of the pain is described as aching. The pain does not radiate. The pain is mild. The pain has been fluctuating since the incident. Pertinent negatives include no muscle weakness, numbness or tingling. Nothing aggravates the symptoms. She has tried nothing for the symptoms.      Review of Systems  Constitutional: Negative.   HENT: Negative.   Eyes: Negative.   Respiratory: Negative.   Cardiovascular: Negative.   Gastrointestinal: Negative.   Musculoskeletal:       Complains 1 week hx/o Rt hand & 3-4-5th finger pain - occasional with locking or triggering of the 4th finger   Neurological: Negative.  Negative for tingling and numbness.     Medication List       This list is accurate as of: 10/06/13 12:55 PM.  Always use your most recent med list.               albuterol 108 (90 BASE) MCG/ACT inhaler  Commonly known as:  PROVENTIL HFA;VENTOLIN HFA  Inhale 2 puffs into the lungs every 4 (four) hours as needed for wheezing or shortness of breath (((PLAN B))).     aspirin EC 81 MG tablet  Take 81 mg by mouth daily.     diltiazem 240 MG 24 hr capsule  Commonly known as:  CARTIA XT  Take 1 capsule (240 mg total) by mouth daily.     esomeprazole 40 MG capsule  Commonly known as:  NEXIUM  Take 1 capsule (40 mg total) by mouth daily.     gabapentin 300 MG capsule  Commonly known as:  NEURONTIN  Take 300 mg by mouth. Does not take when on tramadol     HYDROcodone-acetaminophen 5-325 MG per tablet  Commonly known as:  NORCO/VICODIN  Take 1-2 tablets by mouth every 6 (six) hours as needed for moderate pain.     hydrOXYzine 25 MG tablet  Commonly known as:  ATARAX/VISTARIL  Take 1 to 2 tabs 3 to 4 x day  as needed for anxiety     loperamide 2 MG tablet  Commonly known as:  IMODIUM A-D  Take 2 mg by mouth as needed for diarrhea or loose stools. Per box as needed for diarrhea     LORazepam 0.5 MG tablet  Commonly known as:  ATIVAN  Take 2 tablets (1 mg total) by mouth at bedtime. Takes 1 in daytime and 2 at HS     Magnesium 500 MG Tabs  Take 500 mg by mouth daily.     mirtazapine 45 MG tablet  Commonly known as:  REMERON  Take 1 tablet (45 mg total) by mouth at bedtime.     mometasone-formoterol 100-5 MCG/ACT Aero  Commonly known as:  DULERA  Inhale 2 puffs into the lungs 2 (two) times daily.     montelukast 10 MG tablet  Commonly known as:  SINGULAIR  Take 10 mg by mouth daily.     oxybutynin 5 MG tablet  Commonly known as:  DITROPAN  Take 5 mg by mouth at bedtime.     QUEtiapine 400 MG tablet  Commonly known as:  SEROQUEL  Take 1 tablet (400 mg total) by mouth  at bedtime.     quinapril 40 MG tablet  Commonly known as:  ACCUPRIL  TAKE 1 TABLET BY MOUTH AT BEDTIME     tiotropium 18 MCG inhalation capsule  Commonly known as:  SPIRIVA  Place 18 mcg into inhaler and inhale daily.     traMADol 50 MG tablet  Commonly known as:  ULTRAM  Take 1 tablet (50 mg total) by mouth every 6 (six) hours as needed.     TUDORZA PRESSAIR 400 MCG/ACT Aepb  Generic drug:  Aclidinium Bromide  Take 1 puff by mouth daily.     vitamin B-12 1000 MCG tablet  Commonly known as:  CYANOCOBALAMIN  Take 1,000 mcg by mouth daily.     vitamin C 500 MG tablet  Commonly known as:  ASCORBIC ACID  Take 500 mg by mouth daily.       Allergies  Allergen Reactions  . Amoxapine And Related Nausea And Vomiting  . Amoxicillin     itching  . Ciprocinonide [Fluocinolone]     Pt unsure  . Ciprofloxacin     Pt unsure  . Doxycycline Nausea And Vomiting  . Erythromycin Hives  . Sulfa Antibiotics Other (See Comments)    Unknown-reaction as a child  . Zofran [Ondansetron Hcl] Nausea And Vomiting    Past Medical History  Diagnosis Date  . Asthma   . Hypertension   . GERD (gastroesophageal reflux disease)   . Stroke     was on Plavix - not now  . Carotid artery aneurysm 03/04/2012  . DJD (degenerative joint disease)   . COPD (chronic obstructive pulmonary disease)   . Depression   . Shortness of breath    In addition to the HPI above,  No Fever-chills,  No Headache, No changes with Vision or hearing,  No problems swallowing food or Liquids,  No Chest pain or productive Cough or Shortness of Breath,  No Abdominal pain, No Nausea or Vomitting, Bowel movements are regular,  No Blood in stool or Urine,  No dysuria,  No new skin rashes or bruises, ,  No new weakness, tingling, numbness in any extremity,  No recent weight loss,  No polyuria, polydypsia or polyphagia,  No significant Mental Stressors.  A full 10 point Review of Systems was done, except as stated above, all other Review of Systems were negative  BP 126/64  Pulse 88  Temp(Src) 99.5 F (37.5 C) (Temporal)  Resp 18  Ht 5' 1.5" (1.562 m)  Wt 102 lb 3.2 oz (46.358 kg)  BMI 19.00 kg/m2  Objective:   Physical Exam  Constitutional: She is oriented to person, place, and time. No distress.  HENT:  Nose: Nose normal.  Mouth/Throat: Oropharynx is clear and moist. No oropharyngeal exudate.  Negative  Eyes: Conjunctivae and EOM are normal. Pupils are equal, round, and reactive to light.  Neck: Neck supple. No JVD present. No thyromegaly present.  Cardiovascular: Normal rate, regular rhythm and normal heart sounds.   No murmur heard. Pulmonary/Chest: Effort normal and breath sounds normal. She has no wheezes. She has no rales.  Abdominal: Soft. Bowel sounds are normal.  Musculoskeletal: Normal range of motion.  Tenderness noted over the small joints of the Rt 3 - 4  - 5th  Fingers w/o synovitis  Lymphadenopathy:    She has no cervical adenopathy.  Neurological: She is alert and oriented to person, place, and  time. She has normal reflexes.  Skin: Skin is warm and dry. No rash noted. No erythema.  No pallor.    Assessment & Plan:   1. Inflammatory osteoarthritis - Ambulatory referral to Orthopedic Surgery (Dr Tania Ade)  2. Adjustment disorder with mixed anxiety and depressed mood - hydrOXYzine (ATARAX/VISTARIL) 25 MG tablet; Take 1 to 2 tabs 3 to 4 x day as needed for anxiety  Dispense: 100 tablet; Refill: 0

## 2013-10-07 ENCOUNTER — Other Ambulatory Visit: Payer: Self-pay | Admitting: Internal Medicine

## 2013-10-14 DIAGNOSIS — M6530 Trigger finger, unspecified finger: Secondary | ICD-10-CM | POA: Diagnosis not present

## 2013-10-15 ENCOUNTER — Other Ambulatory Visit: Payer: Self-pay | Admitting: *Deleted

## 2013-10-15 ENCOUNTER — Ambulatory Visit (INDEPENDENT_AMBULATORY_CARE_PROVIDER_SITE_OTHER): Payer: Medicare Other | Admitting: Internal Medicine

## 2013-10-15 ENCOUNTER — Encounter: Payer: Self-pay | Admitting: Internal Medicine

## 2013-10-15 VITALS — BP 132/72 | HR 110 | Temp 98.0°F | Resp 18 | Ht 64.5 in | Wt 99.0 lb

## 2013-10-15 DIAGNOSIS — E782 Mixed hyperlipidemia: Secondary | ICD-10-CM

## 2013-10-15 DIAGNOSIS — R7309 Other abnormal glucose: Secondary | ICD-10-CM | POA: Diagnosis not present

## 2013-10-15 DIAGNOSIS — E559 Vitamin D deficiency, unspecified: Secondary | ICD-10-CM | POA: Diagnosis not present

## 2013-10-15 DIAGNOSIS — Z79899 Other long term (current) drug therapy: Secondary | ICD-10-CM

## 2013-10-15 DIAGNOSIS — I10 Essential (primary) hypertension: Secondary | ICD-10-CM | POA: Diagnosis not present

## 2013-10-15 LAB — CBC WITH DIFFERENTIAL/PLATELET
BASOS ABS: 0 10*3/uL (ref 0.0–0.1)
BASOS PCT: 0 % (ref 0–1)
EOS PCT: 1 % (ref 0–5)
Eosinophils Absolute: 0.1 10*3/uL (ref 0.0–0.7)
HEMATOCRIT: 36.2 % (ref 36.0–46.0)
Hemoglobin: 12.6 g/dL (ref 12.0–15.0)
LYMPHS PCT: 28 % (ref 12–46)
Lymphs Abs: 3.2 10*3/uL (ref 0.7–4.0)
MCH: 30.4 pg (ref 26.0–34.0)
MCHC: 34.8 g/dL (ref 30.0–36.0)
MCV: 87.2 fL (ref 78.0–100.0)
MONO ABS: 2.1 10*3/uL — AB (ref 0.1–1.0)
Monocytes Relative: 18 % — ABNORMAL HIGH (ref 3–12)
Neutro Abs: 6.1 10*3/uL (ref 1.7–7.7)
Neutrophils Relative %: 53 % (ref 43–77)
PLATELETS: 397 10*3/uL (ref 150–400)
RBC: 4.15 MIL/uL (ref 3.87–5.11)
RDW: 14.2 % (ref 11.5–15.5)
WBC: 11.5 10*3/uL — AB (ref 4.0–10.5)

## 2013-10-15 LAB — HEMOGLOBIN A1C
Hgb A1c MFr Bld: 5.7 % — ABNORMAL HIGH (ref <5.7)
MEAN PLASMA GLUCOSE: 117 mg/dL — AB (ref <117)

## 2013-10-15 MED ORDER — OMEPRAZOLE 40 MG PO CPDR: 40.0000 mg | DELAYED_RELEASE_CAPSULE | Freq: Every day | ORAL | Status: DC

## 2013-10-15 MED ORDER — OMEPRAZOLE 40 MG PO CPDR: 40.0000 mg | DELAYED_RELEASE_CAPSULE | Freq: Every day | ORAL | Status: AC

## 2013-10-15 NOTE — Progress Notes (Signed)
Patient ID: [name], female   DOB: Mar 07, 1946, 68 y.o.   MRN: [id number]   This very nice 68 y.o.female presents for 3 month follow up with Hypertension, Hyperlipidemia, Pre-Diabetes and Vitamin D Deficiency. Patient also has GOLD 3 COPD and is followed by Dr Elsworth Soho. She admits DOE at short distances and reports scant amounts of sputum.   HTN predates since 1998. BP has been controlled at home. Today's BP: 132/72 mmHg. Patient denies any cardiac type chest pain, palpitations, dyspnea/orthopnea/PND, dizziness, claudication, or dependent edema.   Hyperlipidemia is controlled with diet. Last Lipids were at goal as below.  Lab Results  Component Value Date   CHOL 132 08/18/2013   HDL 64 08/18/2013   LDLCALC 50 08/18/2013   TRIG 90 08/18/2013   CHOLHDL 2.1 08/18/2013    Also, the patient has history of PreDiabetes with A1c 5.9% in Jan 2013  and last A1c was 5.5% in May  2015. Patient denies any symptoms of reactive hypoglycemia, diabetic polys, paresthesias or visual blurring.   Further, Patient has history of Vitamin D Deficiency of 48 in 2012 and last vitamin D was 40 in May 2015. Patient supplements vitamin D without any suspected side-effects.   Medication List   albuterol 108 (90 BASE) MCG/ACT inhaler  Commonly known as:  PROVENTIL HFA;VENTOLIN HFA  Inhale 2 puffs into the lungs every 4 (four) hours as needed for wheezing or shortness of breath (((PLAN B))).     aspirin EC 81 MG tablet  Take 81 mg by mouth daily.     gabapentin 300 MG capsule  Commonly known as:  NEURONTIN  Take 300 mg by mouth. Does not take when on tramadol     HYDROcodone-acetaminophen 5-325 MG per tablet  Commonly known as:  NORCO/VICODIN  Take 1-2 tablets by mouth every 6 (six) hours as needed for moderate pain.     hydrOXYzine 25 MG tablet  Commonly known as:  ATARAX/VISTARIL  TAKE 1 OR 2 TABLETS BY MOUTH 3-4 TIMES A DAY AS NEEDED FOR ANXIETY     loperamide 2 MG tablet  Commonly known as:  IMODIUM A-D   Take 2 mg by mouth as needed for diarrhea or loose stools. Per box as needed for diarrhea     LORazepam 0.5 MG tablet  Commonly known as:  ATIVAN  Take 2 tablets (1 mg total) by mouth at bedtime. Takes 1 in daytime and 2 at HS     Magnesium 500 MG Tabs  Take 500 mg by mouth daily.     mirtazapine 45 MG tablet  Commonly known as:  REMERON  Take 1 tablet (45 mg total) by mouth at bedtime.     mometasone-formoterol 100-5 MCG/ACT Aero  Commonly known as:  DULERA  Inhale 2 puffs into the lungs 2 (two) times daily.     montelukast 10 MG tablet  Commonly known as:  SINGULAIR  Take 10 mg by mouth daily.     omeprazole 40 MG capsule  Commonly known as:  PRILOSEC  Take 1 capsule (40 mg total) by mouth daily.     oxybutynin 5 MG tablet  Commonly known as:  DITROPAN  Take 5 mg by mouth at bedtime.     QUEtiapine 400 MG tablet  Commonly known as:  SEROQUEL  Take 1 tablet (400 mg total) by mouth at bedtime.     quinapril 40 MG tablet  Commonly known as:  ACCUPRIL  TAKE 1 TABLET BY MOUTH AT BEDTIME  tiotropium 18 MCG inhalation capsule  Commonly known as:  SPIRIVA  Place 18 mcg into inhaler and inhale daily.     traMADol 50 MG tablet  Commonly known as:  ULTRAM  Take 1 tablet (50 mg total) by mouth every 6 (six) hours as needed.     TUDORZA PRESSAIR 400 MCG/ACT Aepb  Generic drug:  Aclidinium Bromide  Take 1 puff by mouth daily.     vitamin B-12 1000 MCG tablet  Commonly known as:  CYANOCOBALAMIN  Take 1,000 mcg by mouth daily.     vitamin C 500 MG tablet  Commonly known as:  ASCORBIC ACID  Take 500 mg by mouth daily.     Allergies  Allergen Reactions  . Amoxapine And Related Nausea And Vomiting  . Amoxicillin     itching  . Ciprocinonide [Fluocinolone]     Pt unsure  . Ciprofloxacin     Pt unsure  . Doxycycline Nausea And Vomiting  . Erythromycin Hives  . Sulfa Antibiotics Other (See Comments)    Unknown-reaction as a child  . Zofran [Ondansetron Hcl]  Nausea And Vomiting   PMHx:   Past Medical History  Diagnosis Date  . Asthma   . Hypertension   . GERD (gastroesophageal reflux disease)   . Stroke     was on Plavix - not now  . Carotid artery aneurysm 03/04/2012  . DJD (degenerative joint disease)   . COPD (chronic obstructive pulmonary disease)   . Depression   . Shortness of breath    FHx:    Reviewed / unchanged SHx:    Reviewed / unchanged  Systems Review:  Constitutional: Denies fever, chills, wt changes, headaches, insomnia, fatigue, night sweats, change in appetite. Eyes: Denies redness, blurred vision, diplopia, discharge, itchy, watery eyes.  ENT: Denies discharge, congestion, post nasal drip, epistaxis, sore throat, earache, hearing loss, dental pain, tinnitus, vertigo, sinus pain, snoring.  CV: Denies chest pain, palpitations, irregular heartbeat, syncope, dyspnea, diaphoresis, orthopnea, PND, claudication or edema. Respiratory: denies cough, dyspnea, DOE, pleurisy, hoarseness, laryngitis, wheezing.  Gastrointestinal: Denies dysphagia, odynophagia, heartburn, reflux, water brash, abdominal pain or cramps, nausea, vomiting, bloating, diarrhea, constipation, hematemesis, melena, hematochezia  or hemorrhoids. Genitourinary: Denies dysuria, frequency, urgency, nocturia, hesitancy, discharge, hematuria or flank pain. Musculoskeletal: Denies arthralgias, myalgias, stiffness, jt. swelling, pain, limping or strain/sprain.  Skin: Denies pruritus, rash, hives, warts, acne, eczema or change in skin lesion(s). Neuro: No weakness, tremor, incoordination, spasms, paresthesia or pain. Psychiatric: Denies confusion, memory loss or sensory loss. Endo: Denies change in weight, skin or hair change.  Heme/Lymph: No excessive bleeding, bruising or enlarged lymph nodes.  Exam:  BP 132/72  Pulse 110  Temp(Src) 98 F (36.7 C) (Temporal)  Resp 18  Ht 5' 4.5" (1.638 m)  Wt 99 lb (44.906 kg)  BMI 16.74 kg/m2  Appears thin and in no  distress. Voice very hoarse. Eyes: PERRLA, EOMs, conjunctiva no swelling or erythema. Sinuses: No frontal/maxillary tenderness ENT/Mouth: EAC's clear, TM's nl w/o erythema, bulging. Nares clear w/o erythema, swelling, exudates. Oropharynx clear without erythema or exudates. Oral hygiene is good. Tongue normal, non obstructing. Hearing intact.  Neck: Supple. Thyroid nl. Car 2+/2+ without bruits, nodes or JVD. Chest: Respirations nl with BS decreased, clear & equal w/o rales, rhonchi, wheezing or stridor.  Cor: Heart sounds normal w/ regular rate and rhythm without sig. murmurs, gallops, clicks, or rubs. Peripheral pulses normal and equal  without edema.  Abdomen: Soft & bowel sounds normal. Non-tender w/o guarding, rebound, hernias,  masses, or organomegaly.  Lymphatics: Unremarkable.  Musculoskeletal: Full ROM all peripheral extremities, joint stability, 5/5 strength, and normal gait. Generalized decrease in muscle power, tone & bulk. Skin: Warm, dry without exposed rashes, lesions or ecchymosis apparent.  Neuro: Cranial nerves intact, reflexes equal bilaterally. Sensory-motor testing grossly intact. Tendon reflexes grossly intact.  Pysch: Alert & oriented x 3. Insight and judgement nl & appropriate. No ideations.  Assessment and Plan:  1. Hypertension - Continue monitor blood pressure at home. Continue diet/meds same.  2. Hyperlipidemia - Continue diet/meds, exercise,& lifestyle modifications. Continue monitor periodic cholesterol/liver & renal functions   3. Pre-Diabetes - Continue diet, exercise, lifestyle modifications. Monitor appropriate labs.   4. Vitamin D Deficiency - Continue supplementation.  5. COPD  Recommended regular exercise, BP monitoring, weight control, and discussed med and SE's. Recommended labs to assess and monitor clinical status. Further disposition pending results of labs.

## 2013-10-16 LAB — TSH: TSH: 1.313 u[IU]/mL (ref 0.350–4.500)

## 2013-10-16 LAB — MAGNESIUM: MAGNESIUM: 2.2 mg/dL (ref 1.5–2.5)

## 2013-10-16 LAB — LIPID PANEL
Cholesterol: 131 mg/dL (ref 0–200)
HDL: 92 mg/dL (ref >39)
LDL CALC: 25 mg/dL (ref 0–99)
TRIGLYCERIDES: 70 mg/dL (ref <150)
Total CHOL/HDL Ratio: 1.4 Ratio
VLDL: 14 mg/dL (ref 0–40)

## 2013-10-16 LAB — BASIC METABOLIC PANEL WITH GFR
BUN: 13 mg/dL (ref 6–23)
CO2: 29 mEq/L (ref 19–32)
CREATININE: 0.88 mg/dL (ref 0.50–1.10)
Calcium: 9.5 mg/dL (ref 8.4–10.5)
Chloride: 98 mEq/L (ref 96–112)
GFR, EST NON AFRICAN AMERICAN: 67 mL/min
GFR, Est African American: 78 mL/min
Glucose, Bld: 79 mg/dL (ref 70–99)
Potassium: 4.6 mEq/L (ref 3.5–5.3)
Sodium: 134 mEq/L — ABNORMAL LOW (ref 135–145)

## 2013-10-16 LAB — INSULIN, FASTING: INSULIN FASTING, SERUM: 7 u[IU]/mL (ref 3–28)

## 2013-10-16 LAB — HEPATIC FUNCTION PANEL
ALK PHOS: 69 U/L (ref 39–117)
ALT: <8 U/L (ref 0–35)
AST: 13 U/L (ref 0–37)
Albumin: 4.6 g/dL (ref 3.5–5.2)
Bilirubin, Direct: 0.1 mg/dL (ref 0.0–0.3)
Indirect Bilirubin: 0.3 mg/dL (ref 0.2–1.2)
TOTAL PROTEIN: 7 g/dL (ref 6.0–8.3)
Total Bilirubin: 0.4 mg/dL (ref 0.2–1.2)

## 2013-10-16 LAB — VITAMIN D 25 HYDROXY (VIT D DEFICIENCY, FRACTURES): Vit D, 25-Hydroxy: 41 ng/mL (ref 30–89)

## 2013-10-20 ENCOUNTER — Encounter: Payer: Self-pay | Admitting: Podiatry

## 2013-10-20 ENCOUNTER — Ambulatory Visit (INDEPENDENT_AMBULATORY_CARE_PROVIDER_SITE_OTHER): Payer: Medicare Other | Admitting: Podiatry

## 2013-10-20 DIAGNOSIS — M2040 Other hammer toe(s) (acquired), unspecified foot: Secondary | ICD-10-CM

## 2013-10-20 DIAGNOSIS — M79609 Pain in unspecified limb: Secondary | ICD-10-CM

## 2013-10-20 DIAGNOSIS — M79676 Pain in unspecified toe(s): Secondary | ICD-10-CM

## 2013-10-20 NOTE — Progress Notes (Signed)
She presents today for followup complaining of a painful second toe of the right foot with porokeratosis.  Objective: Pulses are palpable bilateral. Reactive hyperkeratosis to the medial aspect of the second digit of the right foot appears to be porokeratosis. She also has calluses to the fifth digit of the right foot. Nails are thick yellow dystrophic and clinically mycotic and painful.  Assessment: Porokeratosis right foot.  Plan: Debridement of all reactive hyperkeratosis today followup with her about 3 weeks at which time we will debridement nails appear

## 2013-11-03 ENCOUNTER — Encounter: Payer: Self-pay | Admitting: Podiatry

## 2013-11-03 ENCOUNTER — Ambulatory Visit (INDEPENDENT_AMBULATORY_CARE_PROVIDER_SITE_OTHER): Payer: Medicare Other | Admitting: Podiatry

## 2013-11-03 DIAGNOSIS — M7750 Other enthesopathy of unspecified foot: Secondary | ICD-10-CM | POA: Diagnosis not present

## 2013-11-03 DIAGNOSIS — M7751 Other enthesopathy of right foot: Secondary | ICD-10-CM

## 2013-11-03 NOTE — Progress Notes (Signed)
She presents today complaining of pain to the DIPJ area of her second digit right foot.  Objective: Pulses are palpable right. She has pain on palpation PIPJ second digit right foot with porokeratosis medial aspect of the second toe.  Assessment: Capsulitis PIPJ second digit right foot porokeratosis second digit right foot.  Plan: Injected dexamethasone about the second DIPJ right foot. And enucleated the lesion.

## 2013-11-06 ENCOUNTER — Other Ambulatory Visit: Payer: Self-pay | Admitting: Internal Medicine

## 2013-11-09 ENCOUNTER — Telehealth: Payer: Self-pay | Admitting: *Deleted

## 2013-11-09 NOTE — Telephone Encounter (Signed)
Pt is calling said that she not sleeping up every 2 hrs has been going on for several wks & asking if there is something we can call in to help her sleep? Said she has tried otc med with no help.

## 2013-11-10 ENCOUNTER — Telehealth: Payer: Self-pay | Admitting: *Deleted

## 2013-11-10 NOTE — Telephone Encounter (Signed)
Patient called and said Hydroxyzine too expensive.  I called [facility] on [address] and gave the pharmacist the GoodRx # and price reduced to $33.31 for # 90 tabs.  Patient aware and per Dr Melford Aase, can take up 4 tabs at bedtime to help her sleep.

## 2013-11-11 DIAGNOSIS — M6530 Trigger finger, unspecified finger: Secondary | ICD-10-CM | POA: Diagnosis not present

## 2013-11-11 NOTE — Telephone Encounter (Signed)
Patient spoke with Theodis Sato, Rn yesterday in reference to this message. Patient is taking all the listed Rxs, except states lost her Lorazapam rx at last refill from pharmacy.  Patient states Caren Griffins told her per Dr. Idell Pickles orders to try taking Atarax rx up to 100 mg at night to see if relief with symptoms.

## 2013-11-12 ENCOUNTER — Other Ambulatory Visit: Payer: Self-pay | Admitting: Internal Medicine

## 2013-11-17 ENCOUNTER — Encounter: Payer: Self-pay | Admitting: Podiatry

## 2013-11-17 ENCOUNTER — Ambulatory Visit (INDEPENDENT_AMBULATORY_CARE_PROVIDER_SITE_OTHER): Payer: Medicare Other | Admitting: Podiatry

## 2013-11-17 VITALS — BP 106/66 | HR 88 | Resp 18

## 2013-11-17 DIAGNOSIS — M2040 Other hammer toe(s) (acquired), unspecified foot: Secondary | ICD-10-CM | POA: Diagnosis not present

## 2013-11-17 NOTE — Progress Notes (Signed)
She presents today for followup of her hammertoe deformity and capsulitis. She states that she seems to be doing much better today.  Objective: Vital signs are stable she is alert and oriented x3 she has no pain on palpation of the fifth toe or the second toe of the right foot.  Assessment: Capsulitis and hammertoe deformity healing well.  Plan: Followup with her in 3 weeks.

## 2013-11-19 ENCOUNTER — Ambulatory Visit: Payer: Self-pay | Admitting: Internal Medicine

## 2013-11-24 DIAGNOSIS — IMO0001 Reserved for inherently not codable concepts without codable children: Secondary | ICD-10-CM | POA: Diagnosis not present

## 2013-11-25 ENCOUNTER — Other Ambulatory Visit: Payer: Self-pay | Admitting: Emergency Medicine

## 2013-12-02 ENCOUNTER — Other Ambulatory Visit: Payer: Self-pay | Admitting: Emergency Medicine

## 2013-12-08 ENCOUNTER — Ambulatory Visit (INDEPENDENT_AMBULATORY_CARE_PROVIDER_SITE_OTHER): Payer: Medicare Other | Admitting: Podiatry

## 2013-12-08 ENCOUNTER — Encounter: Payer: Self-pay | Admitting: Podiatry

## 2013-12-08 VITALS — BP 106/66 | HR 88 | Resp 16

## 2013-12-08 DIAGNOSIS — M7750 Other enthesopathy of unspecified foot: Secondary | ICD-10-CM

## 2013-12-08 DIAGNOSIS — M7751 Other enthesopathy of right foot: Secondary | ICD-10-CM

## 2013-12-08 DIAGNOSIS — L84 Corns and callosities: Secondary | ICD-10-CM | POA: Diagnosis not present

## 2013-12-08 DIAGNOSIS — M79673 Pain in unspecified foot: Secondary | ICD-10-CM

## 2013-12-08 DIAGNOSIS — M79609 Pain in unspecified limb: Secondary | ICD-10-CM

## 2013-12-08 NOTE — Progress Notes (Signed)
She presents today chief complaint of painful hammertoe deformities and long nails.  Objective: Vital signs are stable she is alert and oriented x3. Pulses are palpable bilateral. She has hammertoe deformities with distal clavi which I debrided for her today she also has elongated nails which are sharply incurvated. This for her today.  Assessment: Ingrown nails with nail dystrophy and distal clavi.  Plan: Debridement all reactive hyperkeratosis and nails for her today followup with her as needed

## 2013-12-10 ENCOUNTER — Other Ambulatory Visit: Payer: Self-pay | Admitting: Physician Assistant

## 2013-12-14 ENCOUNTER — Ambulatory Visit (INDEPENDENT_AMBULATORY_CARE_PROVIDER_SITE_OTHER): Payer: Medicare Other | Admitting: *Deleted

## 2013-12-14 DIAGNOSIS — H04129 Dry eye syndrome of unspecified lacrimal gland: Secondary | ICD-10-CM | POA: Diagnosis not present

## 2013-12-14 DIAGNOSIS — Z23 Encounter for immunization: Secondary | ICD-10-CM | POA: Diagnosis not present

## 2013-12-28 ENCOUNTER — Encounter: Payer: Self-pay | Admitting: Internal Medicine

## 2013-12-28 ENCOUNTER — Ambulatory Visit (INDEPENDENT_AMBULATORY_CARE_PROVIDER_SITE_OTHER): Payer: Medicare Other | Admitting: Internal Medicine

## 2013-12-28 VITALS — BP 122/58 | HR 92 | Temp 97.9°F | Resp 16 | Ht 62.0 in | Wt 102.6 lb

## 2013-12-28 DIAGNOSIS — J449 Chronic obstructive pulmonary disease, unspecified: Secondary | ICD-10-CM | POA: Diagnosis not present

## 2013-12-28 DIAGNOSIS — I10 Essential (primary) hypertension: Secondary | ICD-10-CM | POA: Diagnosis not present

## 2013-12-28 MED ORDER — LORAZEPAM 1 MG PO TABS: ORAL_TABLET | ORAL | Status: DC

## 2013-12-28 NOTE — Progress Notes (Signed)
Subjective:    Patient ID: [name], female    DOB: April 19, 1945, 68 y.o.   MRN: [id number]  HPI Patient presents today to discuss BP & hot flashes. She relates she first noticed the hot flashes last Sept/Oct when she was switched from Lorazepam to Hydroxyzine . Also reports increase in intermittent mild anxiety through out the day. Random BP's have been WNL.   Medication List   albuterol 108 (90 BASE) MCG/ACT inhaler  Commonly known as:  PROVENTIL HFA;VENTOLIN HFA  Inhale 2 puffs into the lungs every 4 (four) hours as needed for wheezing or shortness of breath (((PLAN B))).     aspirin EC 81 MG tablet  Take 81 mg by mouth daily.     gabapentin 300 MG capsule  Commonly known as:  NEURONTIN  Take 300 mg by mouth. Does not take when on tramadol     gabapentin 300 MG capsule  Commonly known as:  NEURONTIN  TAKE ONE CAPSULE BY MOUTH THREE TIMES DAILY     HYDROcodone-acetaminophen 5-325 MG per tablet  Commonly known as:  NORCO/VICODIN  Take 1-2 tablets by mouth every 6 (six) hours as needed for moderate pain.     hydrOXYzine 25 MG tablet  Commonly known as:  ATARAX/VISTARIL  TAKE 1 TO 2 TABLETS BY MOUTH THREE TO FOUR TIMES DAILY     loperamide 2 MG tablet  Commonly known as:  IMODIUM A-D  Take 2 mg by mouth as needed for diarrhea or loose stools. Per box as needed for diarrhea     Magnesium 500 MG Tabs  Take 500 mg by mouth daily.     mirtazapine 45 MG tablet  Commonly known as:  REMERON  Take 1 tablet (45 mg total) by mouth at bedtime.     mometasone-formoterol 100-5 MCG/ACT Aero  Commonly known as:  DULERA  Inhale 2 puffs into the lungs 2 (two) times daily.     montelukast 10 MG tablet  Commonly known as:  SINGULAIR  TAKE 1 TABLET BY MOUTH ONCE DAILY     omeprazole 40 MG capsule  Commonly known as:  PRILOSEC  Take 1 capsule (40 mg total) by mouth daily.     oxybutynin 5 MG tablet  Commonly known as:  DITROPAN  Take 5 mg by mouth at bedtime.     QUEtiapine  400 MG tablet  Commonly known as:  SEROQUEL  Take 1 tablet (400 mg total) by mouth at bedtime.     quinapril 40 MG tablet  Commonly known as:  ACCUPRIL  TAKE 1 TABLET BY MOUTH AT BEDTIME     tiotropium 18 MCG inhalation capsule  Commonly known as:  SPIRIVA  Place 18 mcg into inhaler and inhale daily.     traMADol 50 MG tablet  Commonly known as:  ULTRAM  TAKE 1 TABLET BY MOUTH THREE TIMES DAILY AS NEEDED     TUDORZA PRESSAIR 400 MCG/ACT Aepb  Generic drug:  Aclidinium Bromide  Take 1 puff by mouth daily.     vitamin B-12 1000 MCG tablet  Commonly known as:  CYANOCOBALAMIN  Take 1,000 mcg by mouth daily.     vitamin C 500 MG tablet  Commonly known as:  ASCORBIC ACID  Take 500 mg by mouth daily.     Allergies  Allergen Reactions  . Amoxapine And Related Nausea And Vomiting  . Amoxicillin     itching  . Ciprocinonide [Fluocinolone]     Pt unsure  . Ciprofloxacin  Pt unsure  . Doxycycline Nausea And Vomiting  . Erythromycin Hives  . Sulfa Antibiotics Other (See Comments)    Unknown-reaction as a child  . Zofran [Ondansetron Hcl] Nausea And Vomiting   Past Medical History  Diagnosis Date  . Asthma   . Hypertension   . GERD (gastroesophageal reflux disease)   . Stroke     was on Plavix - not now  . Carotid artery aneurysm 03/04/2012  . DJD (degenerative joint disease)   . COPD (chronic obstructive pulmonary disease)   . Depression   . Shortness of breath    Review of Systems Neg otherwise and non contributory.   Objective:   Physical Exam  BP 122/58  Pulse 92  Temp  97.9 F  Resp 16  Ht 5\' 2"    Wt 102 lb 9.6 oz   BMI 18.76   HEENT - Eac's patent. TM's Nl. EOM's full. PERRLA. NasoOroPharynx clear. Neck - supple. Nl Thyroid. Carotids 2+ & No bruits, nodes, JVD Chest - BS distant clear equal BS w/o Rales, rhonchi, wheezes. Cor - Nl HS. RRR w/o sig MGR. PP 1(+). No edema. Abd - No palpable organomegaly, masses or tenderness. BS nl. MS- FROM w/o  deformities. Muscle power, tone and bulk Nl. Gait Nl. Neuro - No obvious Cr N abnormalities. Sensory, motor and Cerebellar functions appear Nl w/o focal abnormalities. Psyche - Mental status normal & appropriate.  No delusions, ideations or obvious mood abnormalities.  Assessment & Plan:   1. Essential hypertension Rx Lorazepam 1 mg and discuss gradual titration.  2. COPD   3. Climacteric [place]

## 2013-12-28 NOTE — Patient Instructions (Signed)
Menopause Menopause is the normal time of life when menstrual periods stop completely. Menopause is complete when you have missed 12 consecutive menstrual periods. It usually occurs between the ages of 48 years and 55 years. Very rarely does a woman develop menopause before the age of 40 years. At menopause, your ovaries stop producing the female hormones estrogen and progesterone. This can cause undesirable symptoms and also affect your health. Sometimes the symptoms may occur 4-5 years before the menopause begins. There is no relationship between menopause and:  Oral contraceptives.  Number of children you had.  Race.  The age your menstrual periods started (menarche). Heavy smokers and very thin women may develop menopause earlier in life. CAUSES  The ovaries stop producing the female hormones estrogen and progesterone.  Other causes include:  Surgery to remove both ovaries.  The ovaries stop functioning for no known reason.  Tumors of the pituitary gland in the brain.  Medical disease that affects the ovaries and hormone production.  Radiation treatment to the abdomen or pelvis.  Chemotherapy that affects the ovaries. SYMPTOMS   Hot flashes.  Night sweats.  Decrease in sex drive.  Vaginal dryness and thinning of the vagina causing painful intercourse.  Dryness of the skin and developing wrinkles.  Headaches.  Tiredness.  Irritability.  Memory problems.  Weight gain.  Bladder infections.  Hair growth of the face and chest.  Infertility. More serious symptoms include:  Loss of bone (osteoporosis) causing breaks (fractures).  Depression.  Hardening and narrowing of the arteries (atherosclerosis) causing heart attacks and strokes. DIAGNOSIS   When the menstrual periods have stopped for 12 straight months.  Physical exam.  Hormone studies of the blood. TREATMENT  There are many treatment choices and nearly as many questions about them. The  decisions to treat or not to treat menopausal changes is an individual choice made with your health care provider. Your health care provider can discuss the treatments with you. Together, you can decide which treatment will work best for you. Your treatment choices may include:   Hormone therapy (estrogen and progesterone).  Non-hormonal medicines.  Treating the individual symptoms with medicine (for example antidepressants for depression).  Herbal medicines that may help specific symptoms.  Counseling by a psychiatrist or psychologist.  Group therapy.  Lifestyle changes including:  Eating healthy.  Regular exercise.  Limiting caffeine and alcohol.  Stress management and meditation.  No treatment. HOME CARE INSTRUCTIONS   Take the medicine your health care provider gives you as directed.  Get plenty of sleep and rest.  Exercise regularly.  Eat a diet that contains calcium (good for the bones) and soy products (acts like estrogen hormone).  Avoid alcoholic beverages.  Do not smoke.  If you have hot flashes, dress in layers.  Take supplements, calcium, and vitamin D to strengthen bones.  You can use over-the-counter lubricants or moisturizers for vaginal dryness.  Group therapy is sometimes very helpful.  Acupuncture may be helpful in some cases. SEEK MEDICAL CARE IF:   You are not sure you are in menopause.  You are having menopausal symptoms and need advice and treatment.  You are still having menstrual periods after age 55 years.  You have pain with intercourse.  Menopause is complete (no menstrual period for 12 months) and you develop vaginal bleeding.  You need a referral to a specialist (gynecologist, psychiatrist, or psychologist) for treatment. SEEK IMMEDIATE MEDICAL CARE IF:   You have severe depression.  You have excessive vaginal bleeding.    You fell and think you have a broken bone.  You have pain when you urinate.  You develop leg or  chest pain.  You have a fast pounding heart beat (palpitations).  You have severe headaches.  You develop vision problems.  You feel a lump in your breast.  You have abdominal pain or severe indigestion. Document Released: 06/02/2003 Document Revised: 11/12/2012 Document Reviewed: 10/09/2012 [employer] Patient Information 2015 [employer]. This information is not intended to replace advice given to you by your health care provider. Make sure you discuss any questions you have with your health care provider.  

## 2013-12-29 ENCOUNTER — Ambulatory Visit: Payer: Medicare Other | Admitting: Podiatry

## 2014-01-01 ENCOUNTER — Other Ambulatory Visit: Payer: Self-pay | Admitting: Internal Medicine

## 2014-01-05 ENCOUNTER — Encounter: Payer: Self-pay | Admitting: Podiatry

## 2014-01-05 ENCOUNTER — Ambulatory Visit (INDEPENDENT_AMBULATORY_CARE_PROVIDER_SITE_OTHER): Payer: Medicare Other | Admitting: Podiatry

## 2014-01-05 VITALS — BP 105/78 | HR 65 | Resp 16

## 2014-01-05 DIAGNOSIS — M79673 Pain in unspecified foot: Secondary | ICD-10-CM

## 2014-01-05 DIAGNOSIS — M7751 Other enthesopathy of right foot: Secondary | ICD-10-CM

## 2014-01-05 NOTE — Progress Notes (Signed)
   Subjective:    Patient ID: [name], female    DOB: 1945-06-25, 68 y.o.   MRN: [id number]  HPI  Pt presents for ongoing foot care, she states that her 2nd right met is still bothering her  Review of Systems     Objective:   Physical Exam: I have reviewed her past medical history medications and allergies. Pulses are strongly palpable bilateral. She has a painful second toe of the right foot with a porokeratotic lesion. I debrided and enucleated the lesion today and she tolerated this procedure well.        Assessment & Plan:  Assessment: Porokeratosis hammertoe deformity right foot.  Plan: Enucleated the lesion for her today and will followup with her as needed.

## 2014-01-16 ENCOUNTER — Other Ambulatory Visit: Payer: Self-pay | Admitting: Internal Medicine

## 2014-01-25 ENCOUNTER — Encounter: Payer: Self-pay | Admitting: Internal Medicine

## 2014-01-25 ENCOUNTER — Other Ambulatory Visit: Payer: Self-pay | Admitting: Internal Medicine

## 2014-01-25 ENCOUNTER — Ambulatory Visit: Payer: Self-pay | Admitting: Physician Assistant

## 2014-01-25 ENCOUNTER — Ambulatory Visit (INDEPENDENT_AMBULATORY_CARE_PROVIDER_SITE_OTHER): Payer: Medicare Other | Admitting: Internal Medicine

## 2014-01-25 VITALS — BP 122/60 | HR 80 | Temp 97.7°F | Resp 16 | Ht 62.0 in | Wt 103.4 lb

## 2014-01-25 DIAGNOSIS — R7309 Other abnormal glucose: Secondary | ICD-10-CM | POA: Diagnosis not present

## 2014-01-25 DIAGNOSIS — Z79899 Other long term (current) drug therapy: Secondary | ICD-10-CM

## 2014-01-25 DIAGNOSIS — I10 Essential (primary) hypertension: Secondary | ICD-10-CM

## 2014-01-25 DIAGNOSIS — Z1231 Encounter for screening mammogram for malignant neoplasm of breast: Secondary | ICD-10-CM

## 2014-01-25 DIAGNOSIS — R7303 Prediabetes: Secondary | ICD-10-CM

## 2014-01-25 DIAGNOSIS — E559 Vitamin D deficiency, unspecified: Secondary | ICD-10-CM

## 2014-01-25 DIAGNOSIS — E782 Mixed hyperlipidemia: Secondary | ICD-10-CM | POA: Diagnosis not present

## 2014-01-25 NOTE — Progress Notes (Signed)
Patient ID: [name], female   DOB: 06-Feb-1946, 68 y.o.   MRN: [id number]   This very nice 68 y.o.female presents for 3 month follow up with Hypertension, Hyperlipidemia, COPD (Gold 3), Pre-Diabetes and Vitamin D Deficiency. Patient reports her COPD is stable and currently she is off of her inhalers. She is followed by Dr Radene Gunning. Patient has been thoroughly evaluated for persistent hoarseness.   Patient is treated for HTN & BP has been controlled at home. Today's BP: 122/60 mmHg. Patient has had no complaints of any cardiac type chest pain, palpitations, dyspnea/orthopnea/PND, dizziness, claudication, or dependent edema.   Hyperlipidemia is controlled with diet & meds. Patient denies myalgias or other med SE's. Last Lipids were at goal with Total Chol  131; HDL 92; LDL 25; and Trig 70 on  10/15/2013.   Also, the patient has history of PreDiabetes and has had no symptoms of reactive hypoglycemia, diabetic polys, paresthesias or visual blurring.  Last A1c was  5.7% on 10/15/2013.   Further, the patient also has history of Vitamin D Deficiency and supplements vitamin D without any suspected side-effects. Last vitamin D was  41 on  10/15/2013.    Medication List   aspirin EC 81 MG tablet  Take 81 mg by mouth daily.     gabapentin 300 MG capsule  Commonly known as:  NEURONTIN  Take 300 mg by mouth. Does not take when on tramadol     hydrOXYzine 25 MG tablet  Commonly known as:  ATARAX/VISTARIL     loperamide 2 MG tablet  Commonly known as:  IMODIUM A-D  Take 2 mg by mouth as needed for diarrhea or loose stools. Per box as needed for diarrhea     LORazepam 1 MG tablet  Commonly known as:  ATIVAN  Take 1/2 or 1 tablet 3 x daily as needed for anxiety or hot flashes     Magnesium 500 MG Tabs  Take 500 mg by mouth daily.     mirtazapine 45 MG tablet  Commonly known as:  REMERON  Take 1 tablet (45 mg total) by mouth at bedtime.     mometasone-formoterol 100-5 MCG/ACT Aero  Commonly known as:   DULERA  Inhale 2 puffs into the lungs 2 (two) times daily.     montelukast 10 MG tablet  Commonly known as:  SINGULAIR  TAKE 1 TABLET BY MOUTH ONCE DAILY     omeprazole 40 MG capsule  Commonly known as:  PRILOSEC  Take 1 capsule (40 mg total) by mouth daily.     oxybutynin 5 MG tablet  Commonly known as:  DITROPAN  Take 5 mg by mouth at bedtime.     QUEtiapine 400 MG tablet  Commonly known as:  SEROQUEL  TAKE 1 TABLET BY MOUTH AT BEDTIME     quinapril 40 MG tablet  Commonly known as:  ACCUPRIL  TAKE 1 TABLET BY MOUTH AT BEDTIME     tiotropium 18 MCG inhalation capsule  Commonly known as:  SPIRIVA  Place 18 mcg into inhaler and inhale daily.     traMADol 50 MG tablet  Commonly known as:  ULTRAM  TAKE 1 TABLET BY MOUTH THREE TIMES DAILY AS NEEDED     TUDORZA PRESSAIR 400 MCG/ACT Aepb  Generic drug:  Aclidinium Bromide  Take 1 puff by mouth daily.     vitamin B-12 1000 MCG tablet  Commonly known as:  CYANOCOBALAMIN  Take 1,000 mcg by mouth daily.     vitamin C  500 MG tablet  Commonly known as:  ASCORBIC ACID  Take 500 mg by mouth daily.     Allergies  Allergen Reactions  . Amoxapine And Related Nausea And Vomiting  . Amoxicillin     itching  . Ciprocinonide [Fluocinolone]     Pt unsure  . Ciprofloxacin     Pt unsure  . Doxycycline Nausea And Vomiting  . Erythromycin Hives  . Sulfa Antibiotics Other (See Comments)    Unknown-reaction as a child  . Zofran [Ondansetron Hcl] Nausea And Vomiting   PMHx:   Past Medical History  Diagnosis Date  . Asthma   . Hypertension   . GERD (gastroesophageal reflux disease)   . Stroke     was on Plavix - not now  . Carotid artery aneurysm 03/04/2012  . DJD (degenerative joint disease)   . COPD (chronic obstructive pulmonary disease)   . Depression   . Shortness of breath    Immunization History  Administered Date(s) Administered  . Influenza Whole 12/25/2011, 12/02/2012  . Influenza, High Dose Seasonal PF  12/14/2013  . Pneumococcal Conjugate-13 12/14/2013  . Pneumococcal-Unspecified 01/29/2007, 02/02/2013  . Td 03/27/2011  . Zoster 01/23/2006   Past Surgical History  Procedure Laterality Date  . Video bronchoscopy  04/24/2012    Procedure: VIDEO BRONCHOSCOPY WITHOUT FLUORO;  Surgeon: Rigoberto Noel, MD;  Location: [facility];  Service: Cardiopulmonary;  Laterality: Bilateral;  . Tubal ligation  30 years  . Tonsillectomy    . Colonoscopy  2004  . Hernia repair Right 2014    inguinal  . Cataract extraction Bilateral   . Microlaryngoscopy with co2 laser and excision of vocal cord lesion N/A 06/05/2013    Procedure: MICROLARYNGOSCOPY WITH CO2 LASER AND EXCISION OF VOCAL CORD POLYPS;  Surgeon: Melida Quitter, MD;  Location: [facility];  Service: ENT;  Laterality: N/A;   FHx:    Reviewed / unchanged  SHx:    Reviewed / unchanged  Systems Review:  Constitutional: Denies fever, chills, wt changes, headaches, insomnia, fatigue, night sweats, change in appetite. Eyes: Denies redness, blurred vision, diplopia, discharge, itchy, watery eyes.  ENT: Denies discharge, congestion, post nasal drip, epistaxis, sore throat, earache, hearing loss, dental pain, tinnitus, vertigo, sinus pain, snoring.  CV: Denies chest pain, palpitations, irregular heartbeat, syncope, dyspnea, diaphoresis, orthopnea, PND, claudication or edema. Respiratory: denies cough, dyspnea, DOE, pleurisy, hoarseness, laryngitis, wheezing.  Gastrointestinal: Denies dysphagia, odynophagia, heartburn, reflux, water brash, abdominal pain or cramps, nausea, vomiting, bloating, diarrhea, constipation, hematemesis, melena, hematochezia  or hemorrhoids. Genitourinary: Denies dysuria, frequency, urgency, nocturia, hesitancy, discharge, hematuria or flank pain. Musculoskeletal: Denies arthralgias, myalgias, stiffness, jt. swelling, pain, limping or strain/sprain.  Skin: Denies pruritus, rash, hives, warts, acne, eczema or change in skin  lesion(s). Neuro: No weakness, tremor, incoordination, spasms, paresthesia or pain. Psychiatric: Denies confusion, memory loss or sensory loss. Endo: Denies change in weight, skin or hair change.  Heme/Lymph: No excessive bleeding, bruising or enlarged lymph nodes.  Exam:  BP 122/60 mmHg  Pulse 80  Temp 97.7 F   Resp 16  Ht 5\' 2"    Wt 103 lb 6.4 oz   BMI 18.91   Appears very thin and in no distress.Speech hoarse. Eyes: PERRLA, EOMs, conjunctiva no swelling or erythema. Sinuses: No frontal/maxillary tenderness ENT/Mouth: EAC's clear, TM's nl w/o erythema, bulging. Nares clear w/o erythema, swelling, exudates. Oropharynx clear without erythema or exudates. Oral hygiene is good. Tongue normal, non obstructing. Hearing intact.  Neck: Supple. Thyroid nl. Car 2+/2+ without bruits,  nodes or JVD. Chest: Respirations nl with BS clear & equal w/o rales, rhonchi, wheezing or stridor.  Cor: Heart sounds normal w/ regular rate and rhythm without sig. murmurs, gallops, clicks, or rubs. Peripheral pulses normal and equal  without edema.  Abdomen: Soft & bowel sounds normal. Non-tender w/o guarding, rebound, hernias, masses, or organomegaly.  Lymphatics: Unremarkable.  Musculoskeletal: Full ROM all peripheral extremities with generalized decrease in muscle power, tone & bulk. Normal gait.  Skin: Warm, dry without exposed rashes, lesions or ecchymosis apparent.  Neuro: Cranial nerves intact, reflexes equal bilaterally. Sensory-motor testing grossly intact. Tendon reflexes grossly intact.  Pysch: Alert & oriented x 3.  Insight and judgement nl & appropriate. No ideations.  Assessment and Plan:  1. Hypertension - Continue monitor blood pressure at home. Continue diet/meds same.  2. Hyperlipidemia - Continue diet/meds, exercise,& lifestyle modifications. Continue monitor periodic cholesterol/liver & renal functions   3. T2_NIDDM Pre-Diabetes - Continue diet, exercise, lifestyle modifications.  Monitor appropriate labs.  4. Vitamin D Deficiency - Continue supplementation.  5. COPD Gold 3 - Smoking cessation encouraged & discussed options.   Recommended regular exercise, BP monitoring, weight control, and discussed med and SE's. Recommended labs to assess and monitor clinical status. Further disposition pending results of labs.

## 2014-01-26 ENCOUNTER — Ambulatory Visit: Payer: Medicare Other | Admitting: Podiatry

## 2014-01-26 LAB — HEPATIC FUNCTION PANEL
ALBUMIN: 4 g/dL (ref 3.5–5.2)
ALT: <8 U/L (ref 0–35)
AST: 18 U/L (ref 0–37)
Alkaline Phosphatase: 71 U/L (ref 39–117)
BILIRUBIN TOTAL: 0.2 mg/dL (ref 0.2–1.2)
Bilirubin, Direct: 0.1 mg/dL (ref 0.0–0.3)
Indirect Bilirubin: 0.1 mg/dL — ABNORMAL LOW (ref 0.2–1.2)
Total Protein: 6 g/dL (ref 6.0–8.3)

## 2014-01-26 LAB — CBC WITH DIFFERENTIAL/PLATELET
Basophils Absolute: 0.1 10*3/uL (ref 0.0–0.1)
Basophils Relative: 1 % (ref 0–1)
EOS ABS: 0.6 10*3/uL (ref 0.0–0.7)
EOS PCT: 7 % — AB (ref 0–5)
HCT: 33.7 % — ABNORMAL LOW (ref 36.0–46.0)
Hemoglobin: 11.4 g/dL — ABNORMAL LOW (ref 12.0–15.0)
Lymphocytes Relative: 26 % (ref 12–46)
Lymphs Abs: 2.2 10*3/uL (ref 0.7–4.0)
MCH: 28.2 pg (ref 26.0–34.0)
MCHC: 33.8 g/dL (ref 30.0–36.0)
MCV: 83.4 fL (ref 78.0–100.0)
Monocytes Absolute: 1.3 10*3/uL — ABNORMAL HIGH (ref 0.1–1.0)
Monocytes Relative: 16 % — ABNORMAL HIGH (ref 3–12)
Neutro Abs: 4.2 10*3/uL (ref 1.7–7.7)
Neutrophils Relative %: 50 % (ref 43–77)
PLATELETS: 394 10*3/uL (ref 150–400)
RBC: 4.04 MIL/uL (ref 3.87–5.11)
RDW: 14.8 % (ref 11.5–15.5)
WBC: 8.3 10*3/uL (ref 4.0–10.5)

## 2014-01-26 LAB — VITAMIN D 25 HYDROXY (VIT D DEFICIENCY, FRACTURES): Vit D, 25-Hydroxy: 44 ng/mL (ref 30–89)

## 2014-01-26 LAB — BASIC METABOLIC PANEL WITH GFR
BUN: 9 mg/dL (ref 6–23)
CALCIUM: 8.7 mg/dL (ref 8.4–10.5)
CO2: 28 mEq/L (ref 19–32)
CREATININE: 0.89 mg/dL (ref 0.50–1.10)
Chloride: 100 mEq/L (ref 96–112)
GFR, EST NON AFRICAN AMERICAN: 66 mL/min
GFR, Est African American: 76 mL/min
Glucose, Bld: 86 mg/dL (ref 70–99)
Potassium: 4.3 mEq/L (ref 3.5–5.3)
Sodium: 135 mEq/L (ref 135–145)

## 2014-01-26 LAB — HEMOGLOBIN A1C
Hgb A1c MFr Bld: 5.9 % — ABNORMAL HIGH (ref <5.7)
Mean Plasma Glucose: 123 mg/dL — ABNORMAL HIGH (ref <117)

## 2014-01-26 LAB — LIPID PANEL
Cholesterol: 121 mg/dL (ref 0–200)
HDL: 55 mg/dL (ref >39)
LDL Cholesterol: 35 mg/dL (ref 0–99)
TRIGLYCERIDES: 157 mg/dL — AB (ref <150)
Total CHOL/HDL Ratio: 2.2 Ratio
VLDL: 31 mg/dL (ref 0–40)

## 2014-01-26 LAB — MAGNESIUM: MAGNESIUM: 1.8 mg/dL (ref 1.5–2.5)

## 2014-01-26 LAB — INSULIN, FASTING: INSULIN FASTING, SERUM: 3.6 u[IU]/mL (ref 2.0–19.6)

## 2014-01-26 LAB — TSH: TSH: 1.914 u[IU]/mL (ref 0.350–4.500)

## 2014-01-28 DIAGNOSIS — R490 Dysphonia: Secondary | ICD-10-CM | POA: Diagnosis not present

## 2014-01-28 DIAGNOSIS — J381 Polyp of vocal cord and larynx: Secondary | ICD-10-CM | POA: Diagnosis not present

## 2014-02-02 ENCOUNTER — Encounter: Payer: Self-pay | Admitting: Podiatry

## 2014-02-02 ENCOUNTER — Ambulatory Visit (INDEPENDENT_AMBULATORY_CARE_PROVIDER_SITE_OTHER): Payer: Medicare Other | Admitting: Podiatry

## 2014-02-02 VITALS — BP 104/66 | HR 88 | Resp 16

## 2014-02-02 DIAGNOSIS — B351 Tinea unguium: Secondary | ICD-10-CM

## 2014-02-02 DIAGNOSIS — M79676 Pain in unspecified toe(s): Secondary | ICD-10-CM | POA: Diagnosis not present

## 2014-02-02 NOTE — Progress Notes (Signed)
She presents today to complaining of painful elongated toenails.  Objective: Vital signs are stable she is alert and oriented 3 pulses are palpable bilateral. She has pain on palpation nails 1 through 5 bilaterally are thick yellow dystrophic mycotic.  Assessment: Pain in limb secondary to onychomycosis.  Plan: Debridement of nails and thickness and length as a covered service secondary to pain.

## 2014-02-04 ENCOUNTER — Ambulatory Visit: Payer: Medicare Other | Admitting: Podiatry

## 2014-02-05 ENCOUNTER — Other Ambulatory Visit: Payer: Self-pay | Admitting: Physician Assistant

## 2014-02-05 DIAGNOSIS — M5030 Other cervical disc degeneration, unspecified cervical region: Secondary | ICD-10-CM | POA: Diagnosis not present

## 2014-02-05 DIAGNOSIS — M542 Cervicalgia: Secondary | ICD-10-CM | POA: Diagnosis not present

## 2014-02-08 ENCOUNTER — Other Ambulatory Visit: Payer: Self-pay | Admitting: Physician Assistant

## 2014-02-09 ENCOUNTER — Other Ambulatory Visit: Payer: Self-pay

## 2014-02-09 DIAGNOSIS — L570 Actinic keratosis: Secondary | ICD-10-CM | POA: Diagnosis not present

## 2014-02-09 DIAGNOSIS — C44729 Squamous cell carcinoma of skin of left lower limb, including hip: Secondary | ICD-10-CM | POA: Diagnosis not present

## 2014-02-09 DIAGNOSIS — D485 Neoplasm of uncertain behavior of skin: Secondary | ICD-10-CM | POA: Diagnosis not present

## 2014-02-09 DIAGNOSIS — Z85828 Personal history of other malignant neoplasm of skin: Secondary | ICD-10-CM | POA: Diagnosis not present

## 2014-02-11 ENCOUNTER — Ambulatory Visit: Payer: Medicare Other | Admitting: Podiatry

## 2014-02-22 ENCOUNTER — Ambulatory Visit (INDEPENDENT_AMBULATORY_CARE_PROVIDER_SITE_OTHER): Payer: Medicare Other | Admitting: Podiatry

## 2014-02-22 ENCOUNTER — Encounter: Payer: Self-pay | Admitting: Podiatry

## 2014-02-22 ENCOUNTER — Other Ambulatory Visit: Payer: Self-pay | Admitting: Internal Medicine

## 2014-02-22 ENCOUNTER — Ambulatory Visit (HOSPITAL_COMMUNITY): Payer: Medicare Other | Attending: Internal Medicine

## 2014-02-22 VITALS — BP 105/78 | HR 65 | Resp 16

## 2014-02-22 DIAGNOSIS — M79676 Pain in unspecified toe(s): Secondary | ICD-10-CM | POA: Diagnosis not present

## 2014-02-22 DIAGNOSIS — L84 Corns and callosities: Secondary | ICD-10-CM

## 2014-02-22 MED ORDER — TRAMADOL HCL 50 MG PO TABS: 50.0000 mg | ORAL_TABLET | Freq: Three times a day (TID) | ORAL | Status: DC | PRN

## 2014-02-23 ENCOUNTER — Ambulatory Visit: Payer: Medicare Other | Admitting: Podiatry

## 2014-02-25 NOTE — Progress Notes (Signed)
Subjective:     Patient ID: [name], female   DOB: [birth year], 68 y.o.   MRN: [id number]  HPI patient presents as someone who I have not seen with a painful lesion on the second toe right foot and abnormal positioning of the big toe against the second toe right foot   Review of Systems     Objective:   Physical Exam Neurovascular status unchanged from previous visit with keratotic lesion distal medial aspect digit 2 right that's painful when pressed and makes shoe gear difficult with structural changes of the big toe and second toe    Assessment:     Digital deformity with keratotic lesion of the second digit right medial distal side    Plan:     Debridement of lesion accomplished with no iatrogenic bleeding and discussed hammertoe repair which may be necessary in the future

## 2014-02-26 ENCOUNTER — Ambulatory Visit (HOSPITAL_COMMUNITY): Payer: Medicare Other | Attending: Internal Medicine

## 2014-03-04 ENCOUNTER — Ambulatory Visit (INDEPENDENT_AMBULATORY_CARE_PROVIDER_SITE_OTHER): Payer: Medicare Other | Admitting: Physician Assistant

## 2014-03-04 ENCOUNTER — Encounter: Payer: Self-pay | Admitting: Physician Assistant

## 2014-03-04 VITALS — BP 136/82 | HR 80 | Temp 99.5°F | Resp 18 | Ht 62.0 in | Wt 100.4 lb

## 2014-03-04 DIAGNOSIS — D649 Anemia, unspecified: Secondary | ICD-10-CM

## 2014-03-04 DIAGNOSIS — R6883 Chills (without fever): Secondary | ICD-10-CM | POA: Diagnosis not present

## 2014-03-04 DIAGNOSIS — J029 Acute pharyngitis, unspecified: Secondary | ICD-10-CM

## 2014-03-04 DIAGNOSIS — R350 Frequency of micturition: Secondary | ICD-10-CM | POA: Diagnosis not present

## 2014-03-04 DIAGNOSIS — E538 Deficiency of other specified B group vitamins: Secondary | ICD-10-CM

## 2014-03-04 MED ORDER — AZITHROMYCIN 250 MG PO TABS: ORAL_TABLET | ORAL | Status: DC

## 2014-03-04 NOTE — Progress Notes (Signed)
Subjective:    Patient ID: [name], female    DOB: 1945/04/04, 68 y.o.   MRN: [id number]  HPI 68 y.o. with history female presents with sore throat with eating and chills for 3 weeks. She states that every night she has been having chills, will happen intermittently, denies diaphoresis with chills. She has also had "discomfort" with eating only, denies any problems with pills, liquids, denies food getting caught, worsening hoarseness, regurgitation . She started back on lorazepam, started B12 complex. Her CBC showed anemia at her last visit, we will check iron, retic, B12, folate today.    She has chronic hoarseness, sees Dr. Redmond Baseman, has had thorough evaluation for persistent hoarseness. .She has not been on her inhalers.    Lab Results  Component Value Date   WBC 8.3 01/25/2014   HGB 11.4* 01/25/2014   HCT 33.7* 01/25/2014   MCV 83.4 01/25/2014   PLT 394 01/25/2014   Lab Results  Component Value Date   CREATININE 0.89 01/25/2014   BUN 9 01/25/2014   NA 135 01/25/2014   K 4.3 01/25/2014   CL 100 01/25/2014   CO2 28 01/25/2014   Wt Readings from Last 3 Encounters:  03/04/14 100 lb 6.4 oz (45.541 kg)  01/25/14 103 lb 6.4 oz (46.902 kg)  12/28/13 102 lb 9.6 oz (46.539 kg)   Past Medical History  Diagnosis Date  . Asthma   . Hypertension   . GERD (gastroesophageal reflux disease)   . Stroke     was on Plavix - not now  . Carotid artery aneurysm 03/04/2012  . DJD (degenerative joint disease)   . COPD (chronic obstructive pulmonary disease)   . Depression   . Shortness of breath    Current Outpatient Prescriptions on File Prior to Visit  Medication Sig Dispense Refill  . albuterol (PROVENTIL HFA;VENTOLIN HFA) 108 (90 BASE) MCG/ACT inhaler Inhale 2 puffs into the lungs every 4 (four) hours as needed for wheezing or shortness of breath (((PLAN B))).    Marland Kitchen aspirin EC 81 MG tablet Take 81 mg by mouth daily.     Marland Kitchen gabapentin (NEURONTIN) 300 MG capsule Take 300 mg by mouth.  Does not take when on tramadol    . hydrOXYzine (ATARAX/VISTARIL) 25 MG tablet   3  . loperamide (IMODIUM A-D) 2 MG tablet Take 2 mg by mouth as needed for diarrhea or loose stools. Per box as needed for diarrhea    . LORazepam (ATIVAN) 1 MG tablet Take 1/2 or 1 tablet 3 x daily as needed for anxiety or hot flashes 90 tablet 5  . Magnesium 500 MG TABS Take 500 mg by mouth daily.     . mirtazapine (REMERON) 45 MG tablet Take 1 tablet (45 mg total) by mouth at bedtime. 90 tablet 99  . mometasone-formoterol (DULERA) 100-5 MCG/ACT AERO Inhale 2 puffs into the lungs 2 (two) times daily. 1 Inhaler 3  . montelukast (SINGULAIR) 10 MG tablet TAKE 1 TABLET BY MOUTH ONCE DAILY 90 tablet 1  . omeprazole (PRILOSEC) 40 MG capsule Take 1 capsule (40 mg total) by mouth daily. 30 capsule 3  . oxybutynin (DITROPAN) 5 MG tablet Take 5 mg by mouth at bedtime.     Marland Kitchen QUEtiapine (SEROQUEL) 400 MG tablet TAKE 1 TABLET BY MOUTH AT BEDTIME 30 tablet 5  . quinapril (ACCUPRIL) 40 MG tablet TAKE 1 TABLET BY MOUTH AT BEDTIME 90 tablet 1  . tiotropium (SPIRIVA) 18 MCG inhalation capsule Place 18 mcg into  inhaler and inhale daily.    . traMADol (ULTRAM) 50 MG tablet Take 1 tablet (50 mg total) by mouth 3 (three) times daily as needed. 90 tablet 0  . TUDORZA PRESSAIR 400 MCG/ACT AEPB Take 1 puff by mouth daily.     . vitamin B-12 (CYANOCOBALAMIN) 1000 MCG tablet Take 1,000 mcg by mouth daily.    . vitamin C (ASCORBIC ACID) 500 MG tablet Take 500 mg by mouth daily.     No current facility-administered medications on file prior to visit.     Review of Systems  Constitutional: Positive for chills. Negative for fever, diaphoresis, activity change, appetite change, fatigue and unexpected weight change.  HENT: Positive for sore throat (worse with food, no trouble swallowing. ) and voice change (constant, and patient states it has improved). Negative for congestion, dental problem, drooling, ear discharge, ear pain, facial  swelling, hearing loss, mouth sores, nosebleeds, postnasal drip, rhinorrhea, sinus pressure, sneezing, tinnitus and trouble swallowing.   Eyes: Negative.   Respiratory: Negative.   Cardiovascular: Negative.   Gastrointestinal: Negative.   Endocrine: Positive for cold intolerance. Negative for heat intolerance, polydipsia, polyphagia and polyuria.  Genitourinary: Negative.        Abnormal smell to urine 2 weeks ago  Musculoskeletal: Negative.   Skin: Negative.  Negative for rash.  Neurological: Negative.   Hematological: Negative.  Negative for adenopathy. Does not bruise/bleed easily.  Psychiatric/Behavioral: Negative.        Objective:   Physical Exam  Constitutional: She is oriented to person, place, and time. She appears well-developed. No distress.  HENT:  Head: Normocephalic and atraumatic.  Right Ear: External ear normal.  Left Ear: External ear normal.  Mouth/Throat: Posterior oropharyngeal erythema present. No oropharyngeal exudate, posterior oropharyngeal edema or tonsillar abscesses.  Eyes: Conjunctivae are normal. Pupils are equal, round, and reactive to light.  Neck: Normal range of motion. Neck supple. No tracheal deviation present. No thyromegaly present.  Cardiovascular: Normal rate and regular rhythm.   Pulmonary/Chest: Effort normal and breath sounds normal. She has no wheezes.  Diffuse decreased breath sounds  Abdominal: Soft. Bowel sounds are normal. There is no tenderness.  Musculoskeletal: Normal range of motion.  Lymphadenopathy:    She has no cervical adenopathy.  Neurological: She is alert and oriented to person, place, and time.  Skin: Skin is warm and dry. No rash noted.  Left leg with area size of a quarter with erythema around it without exudate, streaking, warmth from recent mole removal.         Assessment & Plan:  Chills-? From anemia- will check anemia labs, check UA C&S, check CMET, TSH, CBC Sore throat-? From infection/inflammation- add  allergy pill, increase prilosec to BID, with smoking history may need to follow up with ENT.

## 2014-03-04 NOTE — Patient Instructions (Signed)

## 2014-03-05 LAB — IRON AND TIBC
%SAT: 20 % (ref 20–55)
Iron: 48 ug/dL (ref 42–145)
TIBC: 239 ug/dL — AB (ref 250–470)
UIBC: 191 ug/dL (ref 125–400)

## 2014-03-05 LAB — URINALYSIS, ROUTINE W REFLEX MICROSCOPIC
BILIRUBIN URINE: NEGATIVE
Glucose, UA: NEGATIVE mg/dL
Hgb urine dipstick: NEGATIVE
KETONES UR: NEGATIVE mg/dL
Leukocytes, UA: NEGATIVE
Nitrite: NEGATIVE
PROTEIN: NEGATIVE mg/dL
Specific Gravity, Urine: 1.013 (ref 1.005–1.030)
UROBILINOGEN UA: 0.2 mg/dL (ref 0.0–1.0)
pH: 5.5 (ref 5.0–8.0)

## 2014-03-05 LAB — CBC WITH DIFFERENTIAL/PLATELET
Basophils Absolute: 0.1 10*3/uL (ref 0.0–0.1)
Basophils Relative: 1 % (ref 0–1)
EOS ABS: 0.5 10*3/uL (ref 0.0–0.7)
EOS PCT: 8 % — AB (ref 0–5)
HCT: 34.3 % — ABNORMAL LOW (ref 36.0–46.0)
HEMOGLOBIN: 11.6 g/dL — AB (ref 12.0–15.0)
LYMPHS ABS: 2.1 10*3/uL (ref 0.7–4.0)
Lymphocytes Relative: 32 % (ref 12–46)
MCH: 29.3 pg (ref 26.0–34.0)
MCHC: 33.8 g/dL (ref 30.0–36.0)
MCV: 86.6 fL (ref 78.0–100.0)
MONO ABS: 0.9 10*3/uL (ref 0.1–1.0)
MONOS PCT: 14 % — AB (ref 3–12)
MPV: 8.7 fL — ABNORMAL LOW (ref 9.4–12.4)
Neutro Abs: 3 10*3/uL (ref 1.7–7.7)
Neutrophils Relative %: 45 % (ref 43–77)
Platelets: 400 10*3/uL (ref 150–400)
RBC: 3.96 MIL/uL (ref 3.87–5.11)
RDW: 16.8 % — ABNORMAL HIGH (ref 11.5–15.5)
WBC: 6.6 10*3/uL (ref 4.0–10.5)

## 2014-03-05 LAB — BASIC METABOLIC PANEL WITH GFR
BUN: 9 mg/dL (ref 6–23)
CALCIUM: 9.5 mg/dL (ref 8.4–10.5)
CO2: 26 meq/L (ref 19–32)
CREATININE: 0.93 mg/dL (ref 0.50–1.10)
Chloride: 101 mEq/L (ref 96–112)
GFR, Est African American: 72 mL/min
GFR, Est Non African American: 62 mL/min
GLUCOSE: 94 mg/dL (ref 70–99)
Potassium: 4.9 mEq/L (ref 3.5–5.3)
Sodium: 136 mEq/L (ref 135–145)

## 2014-03-05 LAB — FERRITIN: Ferritin: 26 ng/mL (ref 10–291)

## 2014-03-05 LAB — HEPATIC FUNCTION PANEL
ALBUMIN: 4.1 g/dL (ref 3.5–5.2)
ALT: 9 U/L (ref 0–35)
AST: 21 U/L (ref 0–37)
Alkaline Phosphatase: 76 U/L (ref 39–117)
Bilirubin, Direct: 0.1 mg/dL (ref 0.0–0.3)
Indirect Bilirubin: 0.2 mg/dL (ref 0.2–1.2)
TOTAL PROTEIN: 6.7 g/dL (ref 6.0–8.3)
Total Bilirubin: 0.3 mg/dL (ref 0.2–1.2)

## 2014-03-05 LAB — VITAMIN B12: Vitamin B-12: 713 pg/mL (ref 211–911)

## 2014-03-05 LAB — RETICULOCYTES
ABS Retic: 27.7 10*3/uL (ref 19.0–186.0)
RBC.: 3.96 MIL/uL (ref 3.87–5.11)
Retic Ct Pct: 0.7 % (ref 0.4–2.3)

## 2014-03-05 LAB — TSH: TSH: 1.265 u[IU]/mL (ref 0.350–4.500)

## 2014-03-05 LAB — FOLATE RBC: RBC FOLATE: 631 ng/mL (ref >280)

## 2014-03-06 LAB — URINE CULTURE
Colony Count: NO GROWTH
Organism ID, Bacteria: NO GROWTH

## 2014-03-08 ENCOUNTER — Ambulatory Visit: Payer: Self-pay

## 2014-03-12 DIAGNOSIS — M542 Cervicalgia: Secondary | ICD-10-CM | POA: Diagnosis not present

## 2014-03-12 DIAGNOSIS — M5134 Other intervertebral disc degeneration, thoracic region: Secondary | ICD-10-CM | POA: Diagnosis not present

## 2014-03-15 ENCOUNTER — Telehealth: Payer: Self-pay | Admitting: *Deleted

## 2014-03-15 ENCOUNTER — Ambulatory Visit: Payer: Medicare Other | Admitting: Podiatry

## 2014-03-15 ENCOUNTER — Ambulatory Visit (HOSPITAL_COMMUNITY): Payer: Medicare Other

## 2014-03-15 ENCOUNTER — Other Ambulatory Visit: Payer: Self-pay | Admitting: Internal Medicine

## 2014-03-15 DIAGNOSIS — M545 Low back pain: Secondary | ICD-10-CM | POA: Diagnosis not present

## 2014-03-15 NOTE — Telephone Encounter (Signed)
Pt states she has an appt tomorrow and has a sore toe and has also hurt her back.  Pt states she will do her best to get in tomorrow, but would like to talk to someone concerning the toe.

## 2014-03-16 ENCOUNTER — Ambulatory Visit (INDEPENDENT_AMBULATORY_CARE_PROVIDER_SITE_OTHER): Payer: Medicare Other | Admitting: Podiatry

## 2014-03-16 ENCOUNTER — Encounter: Payer: Self-pay | Admitting: Podiatry

## 2014-03-16 DIAGNOSIS — L84 Corns and callosities: Secondary | ICD-10-CM

## 2014-03-16 NOTE — Progress Notes (Signed)
She presents today chief complaint of painful callus to the medial aspect of her second digit right foot. She's also complaining of back pain.  Objective: Vital signs are stable she is alert and oriented 3 pulses remain palpable right foot. Solitary porokeratotic lesion medial aspect second digit right foot distally. No erythema edema saline syndrome does not appear to be infected.  Assessment: Keratosis medial aspect second digit right foot.  Plan: Debridement of porokeratotic lesion secondary to pain will follow up with her as needed.

## 2014-03-17 ENCOUNTER — Ambulatory Visit (HOSPITAL_COMMUNITY): Payer: Medicare Other

## 2014-03-22 ENCOUNTER — Other Ambulatory Visit: Payer: Self-pay | Admitting: Internal Medicine

## 2014-03-22 DIAGNOSIS — M4696 Unspecified inflammatory spondylopathy, lumbar region: Secondary | ICD-10-CM | POA: Diagnosis not present

## 2014-03-22 MED ORDER — TRAMADOL HCL 50 MG PO TABS: 50.0000 mg | ORAL_TABLET | Freq: Three times a day (TID) | ORAL | Status: DC | PRN

## 2014-03-23 ENCOUNTER — Ambulatory Visit (HOSPITAL_COMMUNITY): Payer: Medicare Other

## 2014-03-23 DIAGNOSIS — M545 Low back pain: Secondary | ICD-10-CM | POA: Diagnosis not present

## 2014-03-23 DIAGNOSIS — M2560 Stiffness of unspecified joint, not elsewhere classified: Secondary | ICD-10-CM | POA: Diagnosis not present

## 2014-03-23 DIAGNOSIS — M6281 Muscle weakness (generalized): Secondary | ICD-10-CM | POA: Diagnosis not present

## 2014-03-23 NOTE — Progress Notes (Signed)
Called Tramadol into [facility].

## 2014-03-24 ENCOUNTER — Ambulatory Visit (HOSPITAL_COMMUNITY): Payer: Medicare Other

## 2014-03-24 ENCOUNTER — Other Ambulatory Visit: Payer: Self-pay | Admitting: Physician Assistant

## 2014-03-25 ENCOUNTER — Ambulatory Visit (INDEPENDENT_AMBULATORY_CARE_PROVIDER_SITE_OTHER): Payer: Medicare Other | Admitting: Physician Assistant

## 2014-03-25 ENCOUNTER — Encounter: Payer: Self-pay | Admitting: Physician Assistant

## 2014-03-25 ENCOUNTER — Ambulatory Visit (HOSPITAL_COMMUNITY)
Admission: RE | Admit: 2014-03-25 | Discharge: 2014-03-25 | Disposition: A | Discharge status: Home / Self Care | Payer: Medicare Other | Source: Ambulatory Visit | Attending: Internal Medicine | Admitting: Internal Medicine

## 2014-03-25 ENCOUNTER — Other Ambulatory Visit: Payer: Self-pay | Admitting: Internal Medicine

## 2014-03-25 VITALS — BP 118/80 | HR 90 | Temp 97.8°F | Resp 16 | Ht 61.5 in | Wt 100.0 lb

## 2014-03-25 DIAGNOSIS — Z1231 Encounter for screening mammogram for malignant neoplasm of breast: Secondary | ICD-10-CM | POA: Diagnosis not present

## 2014-03-25 DIAGNOSIS — M549 Dorsalgia, unspecified: Secondary | ICD-10-CM | POA: Diagnosis not present

## 2014-03-25 DIAGNOSIS — Z79899 Other long term (current) drug therapy: Secondary | ICD-10-CM | POA: Diagnosis not present

## 2014-03-25 DIAGNOSIS — R32 Unspecified urinary incontinence: Secondary | ICD-10-CM | POA: Diagnosis not present

## 2014-03-25 DIAGNOSIS — K5900 Constipation, unspecified: Secondary | ICD-10-CM

## 2014-03-25 LAB — URINALYSIS, ROUTINE W REFLEX MICROSCOPIC
Bilirubin Urine: NEGATIVE
Glucose, UA: NEGATIVE mg/dL
HGB URINE DIPSTICK: NEGATIVE
Ketones, ur: NEGATIVE mg/dL
Leukocytes, UA: NEGATIVE
NITRITE: NEGATIVE
PROTEIN: NEGATIVE mg/dL
Specific Gravity, Urine: 1.014 (ref 1.005–1.030)
UROBILINOGEN UA: 0.2 mg/dL (ref 0.0–1.0)
pH: 5 (ref 5.0–8.0)

## 2014-03-25 LAB — CBC WITH DIFFERENTIAL/PLATELET
BASOS ABS: 0.1 10*3/uL (ref 0.0–0.1)
BASOS PCT: 1 % (ref 0–1)
Eosinophils Absolute: 0.5 10*3/uL (ref 0.0–0.7)
Eosinophils Relative: 4 % (ref 0–5)
HCT: 35.6 % — ABNORMAL LOW (ref 36.0–46.0)
Hemoglobin: 12.1 g/dL (ref 12.0–15.0)
LYMPHS PCT: 18 % (ref 12–46)
Lymphs Abs: 2.1 10*3/uL (ref 0.7–4.0)
MCH: 29.5 pg (ref 26.0–34.0)
MCHC: 34 g/dL (ref 30.0–36.0)
MCV: 86.8 fL (ref 78.0–100.0)
MONO ABS: 1.8 10*3/uL — AB (ref 0.1–1.0)
MPV: 8.3 fL — AB (ref 8.6–12.4)
Monocytes Relative: 16 % — ABNORMAL HIGH (ref 3–12)
NEUTROS ABS: 7 10*3/uL (ref 1.7–7.7)
NEUTROS PCT: 61 % (ref 43–77)
Platelets: 429 10*3/uL — ABNORMAL HIGH (ref 150–400)
RBC: 4.1 MIL/uL (ref 3.87–5.11)
RDW: 15.8 % — AB (ref 11.5–15.5)
WBC: 11.5 10*3/uL — ABNORMAL HIGH (ref 4.0–10.5)

## 2014-03-25 LAB — BASIC METABOLIC PANEL WITH GFR
BUN: 12 mg/dL (ref 6–23)
CALCIUM: 9.3 mg/dL (ref 8.4–10.5)
CO2: 26 mEq/L (ref 19–32)
CREATININE: 1.18 mg/dL — AB (ref 0.50–1.10)
Chloride: 93 mEq/L — ABNORMAL LOW (ref 96–112)
GFR, EST NON AFRICAN AMERICAN: 47 mL/min — AB
GFR, Est African American: 54 mL/min — ABNORMAL LOW
Glucose, Bld: 85 mg/dL (ref 70–99)
POTASSIUM: 4.4 meq/L (ref 3.5–5.3)
Sodium: 127 mEq/L — ABNORMAL LOW (ref 135–145)

## 2014-03-25 LAB — HEPATIC FUNCTION PANEL
ALBUMIN: 4 g/dL (ref 3.5–5.2)
ALT: 14 U/L (ref 0–35)
AST: 26 U/L (ref 0–37)
Alkaline Phosphatase: 82 U/L (ref 39–117)
BILIRUBIN TOTAL: 0.3 mg/dL (ref 0.2–1.2)
Bilirubin, Direct: 0.1 mg/dL (ref 0.0–0.3)
Indirect Bilirubin: 0.2 mg/dL (ref 0.2–1.2)
TOTAL PROTEIN: 6.4 g/dL (ref 6.0–8.3)

## 2014-03-25 MED ORDER — POLYETHYLENE GLYCOL 3350 17 G PO PACK: 17.0000 g | PACK | Freq: Every day | ORAL | Status: DC

## 2014-03-25 NOTE — Patient Instructions (Signed)
-  Take Miralax as prescribed- 1 packet daily. -Do not take the vitamin C with Iron right now until bowel movements are regular.  Please follow up in 1 week with Dr. Melford Aase for recheck  Constipation Constipation is when a person:  Poops (has a bowel movement) less than 3 times a week.  Has a hard time pooping.  Has poop that is dry, hard, or bigger than normal. HOME CARE   Eat foods with a lot of fiber in them. This includes fruits, vegetables, beans, and whole grains such as brown rice.  Avoid fatty foods and foods with a lot of sugar. This includes french fries, hamburgers, cookies, candy, and soda.  If you are not getting enough fiber from food, take products with added fiber in them (supplements).  Drink enough fluid to keep your pee (urine) clear or pale yellow.  Exercise on a regular basis, or as told by your doctor.  Go to the restroom when you feel like you need to poop. Do not hold it.  Only take medicine as told by your doctor. Do not take medicines that help you poop (laxatives) without talking to your doctor first. GET HELP RIGHT AWAY IF:   You have bright red blood in your poop (stool).  Your constipation lasts more than 4 days or gets worse.  You have belly (abdominal) or butt (rectal) pain.  You have thin poop (as thin as a pencil).  You lose weight, and it cannot be explained. MAKE SURE YOU:   Understand these instructions.  Will watch your condition.  Will get help right away if you are not doing well or get worse. Document Released: 08/29/2007 Document Revised: 03/17/2013 Document Reviewed: 12/22/2012 [facility] Patient Information 2015 [place], [place]. This information is not intended to replace advice given to you by your health care provider. Make sure you discuss any questions you have with your health care provider.

## 2014-03-25 NOTE — Progress Notes (Signed)
Subjective:    Patient ID: [name], female    DOB: July 08, 1945, 68 y.o.   MRN: [id number]  Constipation This is a new problem. Episode onset: About 5 days ago. The problem is unchanged. Stool frequency: Has not gone for 5 days. Stool description: Unable to describe due to constipation and not having bowel movement. The patient is not on a high fiber diet. She does not exercise regularly. Water Intake: unknown. Associated symptoms include abdominal pain and back pain. Pertinent negatives include no diarrhea, nausea or vomiting. Risk factors: Patient states she has not eaten in a few days. Treatments tried: Patient has tried Bisacodyl laxative OTC- 2 tablets daily (5 or 6 tablets total) The treatment provided no relief. Her past medical history is significant for abdominal surgery. (Patient is on mobic and flexeril and tramadol.  Patient states she is taking the vitamin C pill with iron in it, but only took 1 tablet.)  Last colonoscopy was August 19. 2011- it was normal and needs to be repeated in 10 years.  Dr. Laurence Spates did note internal hemorrhoids and anemia without cause found on endoscopic evaluation done on the same day. Current Smoker- 2 cigarettes daily for 30 years GFR= 62 on 03/04/14 Review of Systems  Constitutional: Negative.   HENT: Negative.   Eyes: Negative.   Respiratory: Negative.   Cardiovascular: Negative.   Gastrointestinal: Positive for abdominal pain and constipation. Negative for nausea, vomiting, diarrhea and blood in stool.  Genitourinary: Positive for urgency and frequency. Negative for dysuria and flank pain.       Urinary incontinence  Musculoskeletal: Positive for back pain.       Patient has chronic back pain and is being handled by specialist and they prescribed Mobic and Flexeril.  Skin: Negative.   Neurological: Negative.  Negative for dizziness, light-headedness and headaches.  Psychiatric/Behavioral: Negative.    Past Medical History  Diagnosis Date   . Asthma   . Hypertension   . GERD (gastroesophageal reflux disease)   . Stroke     was on Plavix - not now  . Carotid artery aneurysm 03/04/2012  . DJD (degenerative joint disease)   . COPD (chronic obstructive pulmonary disease)   . Depression   . Shortness of breath    Current Outpatient Prescriptions on File Prior to Visit  Medication Sig Dispense Refill  . aspirin EC 81 MG tablet Take 81 mg by mouth daily.     Marland Kitchen b complex vitamins tablet Take 1 tablet by mouth daily.    . cholecalciferol (VITAMIN D) 1000 UNITS tablet Take 5,000 Units by mouth daily.    Marland Kitchen gabapentin (NEURONTIN) 300 MG capsule TAKE ONE CAPSULE BY MOUTH THREE TIMES DAILY 90 capsule 0  . LORazepam (ATIVAN) 1 MG tablet Take 1/2 or 1 tablet 3 x daily as needed for anxiety or hot flashes 90 tablet 5  . Magnesium 500 MG TABS Take 500 mg by mouth daily.     . mirtazapine (REMERON) 45 MG tablet Take 1 tablet (45 mg total) by mouth at bedtime. 90 tablet 99  . montelukast (SINGULAIR) 10 MG tablet TAKE 1 TABLET BY MOUTH ONCE DAILY 90 tablet 1  . omeprazole (PRILOSEC) 40 MG capsule Take 1 capsule (40 mg total) by mouth daily. 30 capsule 3  . oxybutynin (DITROPAN) 5 MG tablet TAKE 1/2 TO 1 TABLET BY MOUTH THREE TIMES DAILY FOR BLADDER CONTROL 90 tablet 0  . QUEtiapine (SEROQUEL) 400 MG tablet TAKE 1 TABLET BY MOUTH AT BEDTIME  30 tablet 5  . quinapril (ACCUPRIL) 40 MG tablet TAKE 1 TABLET BY MOUTH AT BEDTIME 90 tablet 1  . traMADol (ULTRAM) 50 MG tablet Take 1 tablet (50 mg total) by mouth 3 (three) times daily as needed. 90 tablet 0  . vitamin C (ASCORBIC ACID) 500 MG tablet Take 500 mg by mouth daily.    Marland Kitchen albuterol (PROVENTIL HFA;VENTOLIN HFA) 108 (90 BASE) MCG/ACT inhaler Inhale 2 puffs into the lungs every 4 (four) hours as needed for wheezing or shortness of breath (((PLAN B))).    . hydrOXYzine (ATARAX/VISTARIL) 25 MG tablet   3  . loperamide (IMODIUM A-D) 2 MG tablet Take 2 mg by mouth as needed for diarrhea or loose  stools. Per box as needed for diarrhea    . mometasone-formoterol (DULERA) 100-5 MCG/ACT AERO Inhale 2 puffs into the lungs 2 (two) times daily. (Patient not taking: Reported on 03/25/2014) 1 Inhaler 3  . tiotropium (SPIRIVA) 18 MCG inhalation capsule Place 18 mcg into inhaler and inhale daily.    . TUDORZA PRESSAIR 400 MCG/ACT AEPB Take 1 puff by mouth daily.      No current facility-administered medications on file prior to visit.   Allergies  Allergen Reactions  . Amoxapine And Related Nausea And Vomiting  . Amoxicillin     itching  . Ciprocinonide [Fluocinolone]     Pt unsure  . Ciprofloxacin     Pt unsure  . Doxycycline Nausea And Vomiting  . Erythromycin Hives  . Sulfa Antibiotics Other (See Comments)    Unknown-reaction as a child  . Zofran [Ondansetron Hcl] Nausea And Vomiting     BP 118/80 mmHg  Pulse 90  Temp(Src) 97.8 F (36.6 C) (Temporal)  Resp 16  Ht 5' 1.5" (1.562 m)  Wt 100 lb (45.36 kg)  BMI 18.59 kg/m2  SpO2 98% Wt Readings from Last 3 Encounters:  03/25/14 100 lb (45.36 kg)  03/04/14 100 lb 6.4 oz (45.541 kg)  01/25/14 103 lb 6.4 oz (46.902 kg)   Objective:   Physical Exam  Constitutional: She is oriented to person, place, and time. She appears well-developed and well-nourished. She is cooperative. She does not have a sickly appearance. No distress.  HENT:  Head: Normocephalic.  Right Ear: Tympanic membrane, external ear and ear canal normal.  Left Ear: Tympanic membrane, external ear and ear canal normal.  Nose: Nose normal. Right sinus exhibits no maxillary sinus tenderness and no frontal sinus tenderness. Left sinus exhibits no maxillary sinus tenderness and no frontal sinus tenderness.  Mouth/Throat: Uvula is midline, oropharynx is clear and moist and mucous membranes are normal. Mucous membranes are not pale and not dry. No trismus in the jaw. No uvula swelling. No oropharyngeal exudate, posterior oropharyngeal edema or posterior oropharyngeal  erythema.  Eyes: Conjunctivae, EOM and lids are normal. Pupils are equal, round, and reactive to light. Right eye exhibits no discharge. Left eye exhibits no discharge. No scleral icterus.  Neck: Trachea normal and normal range of motion. Neck supple. No tracheal tenderness present. No tracheal deviation present.  Raspy voice  Cardiovascular: Normal rate, regular rhythm, S1 normal, S2 normal, normal heart sounds and normal pulses.  Exam reveals no gallop, no distant heart sounds and no friction rub.   No murmur heard. Pulmonary/Chest: Effort normal and breath sounds normal. No stridor. No respiratory distress. She has no decreased breath sounds. She has no wheezes. She has no rhonchi. She has no rales. She exhibits no tenderness.  Abdominal: Soft. Bowel sounds are  normal. She exhibits no distension, no abdominal bruit, no ascites, no pulsatile midline mass and no mass. There is no hepatosplenomegaly. There is generalized tenderness. There is no rigidity, no rebound, no guarding and negative Murphy's sign. No hernia.  Lymphadenopathy:  No tenderness or LAD.  Neurological: She is alert and oriented to person, place, and time. No cranial nerve deficit. Gait normal.  Skin: Skin is warm, dry and intact. No rash noted. She is not diaphoretic. No pallor.  Psychiatric: Her speech is normal. Judgment and thought content normal. Her affect is blunt. She is slowed.  Asked me how to spell certain words and would go from one topic to the next.  Vitals reviewed.  Assessment & Plan:  1. Constipation, unspecified constipation type -Take Miralax as prescribed- 1 packet daily.  Gave patient sample packets in office- polyethylene glycol (MIRALAX / GLYCOLAX) packet; Take 17 g by mouth daily.  Dispense: 14 each; Refill: 0  2. Urinary incontinence, unspecified incontinence type Ordered labs to R/O UTI - Urinalysis, Routine w reflex microscopic - Urine culture  3. Encounter for long-term (current) use of  medications Will monitor kidney and liver function. - CBC with Differential - BASIC METABOLIC PANEL WITH GFR - Hepatic function panel  4. Back Pain -Please keep follow up appts with specialist that prescribed Mobic and Flexeril.  Discussed medication effects and SE's.  Pt agreed to treatment plan. Please follow up in 1 week with Dr. Melford Aase. Please keep your physical appt on 04/29/14.  Samon Dishner, Stephani Police, PA-C 1:05 PM [facility]

## 2014-03-26 ENCOUNTER — Telehealth: Payer: Self-pay | Admitting: Physician Assistant

## 2014-03-26 NOTE — Telephone Encounter (Signed)
Spoke with [name] (Patient's daughter) around 1600 on 03/26/14.  [name] that we are concerned about her mother and how she was acting in the office (confused and wanting pain medication).  [name] stated that she is concerned about her mother and her becoming addicted to pain medications.  [name] states she is not sure what to do or how to help her mother.  [name] that we are concerned about her mother's lab results and being low in sodium and having a low kidney function.  Told [name] that we are cutting back on some of her mother's medication and that her mother needs to cut back on free water (1 quart a day) and needs to eat soups.  [name] states her mother does not drink water and only drinks 1 cup of water a day.   [name] we want her mother to STOP taking: Remeron, Atarax/Hydroxyzine and Tramadol. Told [name] that patient can only take 1-1 1/2 tablets of Ativan a day and can only take 1 capsule of gabapentin a day. [name] that her mother needs to come in for labs on Monday for recheck. [name] is grateful that we called her and was wondering what we can do to help her mother.  Told her I will speak with Dr. Melford Aase.   Spoke with [name] around 1618 at home number on 03/26/14 and patient seemed coherent to understand what I was telling her to do with her medications.  Told patient that she needs to STOP taking the Remeron, Atarax/Hydroxyzine and Tramadol.   Patient was OK with stopping the Remeron.   She states she is NOT taking the Atarax/Hydroxyzine She states she refuses to stop taking the Tramadol due to her back pain.  She did agree to cut the current dose in half, but stated she does not promise that. Also told patient to only take 1 tablet of Ativan a day (patient states she can not cut pills in half).  She agreed to 1 tablet a day. Also told patient to take 1 capsule of Gabapentin a day.  She states she sometimes does not take gabapentin and only takes as  needed.  She states she will cut back on Tramadol and take 1 capsule of Gabapentin a day. Also told patient to limit water intake to 1 quart a day.  She states she only drinks 1 cup of water a day, but then drinks soda. Told patient to eat more soup due to sodium content and she agreed. Told patient that she needs to come in Monday for labs and she agreed.  Patient states she wrote all this information down and understands the instructions.  Patient did not want to take Miralax to help with constipation and wanted to take pills.  Told patient that she needs to take Miralax (1 packet per day).  Told her she can mix it in her soda.  She expressed understanding and agreed with plan.    Patient did want me to call in another pain medication at first since I told her to stop Tramadol and I told her I will not do that. Patient did call Dr. Melford Aase yesterday afternoon about 10-15 minutes after appt wanting him to write Hydrocodone for her back pain.  He told her he would not do that and that she needs to she her back specialist for her back pain.  Discussed medication effects and SE's.  Pt agreed to treatment plan. Patient will be in Monday 03/29/14 for lab/nurse visit (CBC, BMP).  Rogerio Boutelle, Stephani Police, PA-C 4:45 PM   Results for orders placed or performed in visit on 03/25/14 (from the past 72 hour(s))  CBC with Differential     Status: Abnormal   Collection Time: 03/25/14 12:40 PM  Result Value Ref Range   WBC 11.5 (H) 4.0 - 10.5 K/uL   RBC 4.10 3.87 - 5.11 MIL/uL   Hemoglobin 12.1 12.0 - 15.0 g/dL   HCT 35.6 (L) 36.0 - 46.0 %   MCV 86.8 78.0 - 100.0 fL   MCH 29.5 26.0 - 34.0 pg   MCHC 34.0 30.0 - 36.0 g/dL   RDW 15.8 (H) 11.5 - 15.5 %   Platelets 429 (H) 150 - 400 K/uL   MPV 8.3 (L) 8.6 - 12.4 fL    Comment: ** Please note change in reference range(s). **   Neutrophils Relative % 61 43 - 77 %   Neutro Abs 7.0 1.7 - 7.7 K/uL   Lymphocytes Relative 18 12 - 46 %   Lymphs Abs 2.1 0.7 -  4.0 K/uL   Monocytes Relative 16 (H) 3 - 12 %   Monocytes Absolute 1.8 (H) 0.1 - 1.0 K/uL   Eosinophils Relative 4 0 - 5 %   Eosinophils Absolute 0.5 0.0 - 0.7 K/uL   Basophils Relative 1 0 - 1 %   Basophils Absolute 0.1 0.0 - 0.1 K/uL   Smear Review Criteria for review not met   BASIC METABOLIC PANEL WITH GFR     Status: Abnormal   Collection Time: 03/25/14 12:40 PM  Result Value Ref Range   Sodium 127 (L) 135 - 145 mEq/L   Potassium 4.4 3.5 - 5.3 mEq/L   Chloride 93 (L) 96 - 112 mEq/L   CO2 26 19 - 32 mEq/L   Glucose, Bld 85 70 - 99 mg/dL   BUN 12 6 - 23 mg/dL   Creat 1.18 (H) 0.50 - 1.10 mg/dL   Calcium 9.3 8.4 - 10.5 mg/dL   GFR, Est African American 54 (L) mL/min   GFR, Est Non African American 47 (L) mL/min    Comment:   The estimated GFR is a calculation valid for adults (>=15 years old) that uses the CKD-EPI algorithm to adjust for age and sex. It is   not to be used for children, pregnant women, hospitalized patients,    patients on dialysis, or with rapidly changing kidney function. According to the NKDEP, eGFR >89 is normal, 60-89 shows mild impairment, 30-59 shows moderate impairment, 15-29 shows severe impairment and <15 is ESRD.     Hepatic function panel     Status: None   Collection Time: 03/25/14 12:40 PM  Result Value Ref Range   Total Bilirubin 0.3 0.2 - 1.2 mg/dL   Bilirubin, Direct 0.1 0.0 - 0.3 mg/dL   Indirect Bilirubin 0.2 0.2 - 1.2 mg/dL   Alkaline Phosphatase 82 39 - 117 U/L   AST 26 0 - 37 U/L   ALT 14 0 - 35 U/L   Total Protein 6.4 6.0 - 8.3 g/dL   Albumin 4.0 3.5 - 5.2 g/dL  Urinalysis, Routine w reflex microscopic     Status: None   Collection Time: 03/25/14 12:41 PM  Result Value Ref Range   Color, Urine YELLOW YELLOW   APPearance CLEAR CLEAR   Specific Gravity, Urine 1.014 1.005 - 1.030   pH 5.0 5.0 - 8.0   Glucose, UA NEG NEG mg/dL   Bilirubin Urine NEG NEG   Ketones, ur NEG NEG  mg/dL   Hgb urine dipstick NEG NEG   Protein, ur NEG  NEG mg/dL   Urobilinogen, UA 0.2 0.0 - 1.0 mg/dL   Nitrite NEG NEG   Leukocytes, UA NEG NEG

## 2014-03-27 LAB — URINE CULTURE

## 2014-03-29 ENCOUNTER — Ambulatory Visit: Payer: Self-pay | Admitting: Physician Assistant

## 2014-03-30 ENCOUNTER — Encounter: Payer: Self-pay | Admitting: Physician Assistant

## 2014-03-30 ENCOUNTER — Ambulatory Visit (INDEPENDENT_AMBULATORY_CARE_PROVIDER_SITE_OTHER): Payer: Medicare Other | Admitting: Physician Assistant

## 2014-03-30 ENCOUNTER — Ambulatory Visit: Payer: Medicare Other | Admitting: Podiatry

## 2014-03-30 VITALS — BP 154/92 | HR 80 | Temp 97.8°F | Resp 16 | Ht 61.5 in | Wt 100.0 lb

## 2014-03-30 DIAGNOSIS — R32 Unspecified urinary incontinence: Secondary | ICD-10-CM | POA: Diagnosis not present

## 2014-03-30 DIAGNOSIS — E871 Hypo-osmolality and hyponatremia: Secondary | ICD-10-CM

## 2014-03-30 DIAGNOSIS — Z79899 Other long term (current) drug therapy: Secondary | ICD-10-CM | POA: Diagnosis not present

## 2014-03-30 DIAGNOSIS — R4189 Other symptoms and signs involving cognitive functions and awareness: Secondary | ICD-10-CM

## 2014-03-30 DIAGNOSIS — M4696 Unspecified inflammatory spondylopathy, lumbar region: Secondary | ICD-10-CM | POA: Diagnosis not present

## 2014-03-30 DIAGNOSIS — M47816 Spondylosis without myelopathy or radiculopathy, lumbar region: Secondary | ICD-10-CM | POA: Diagnosis not present

## 2014-03-30 DIAGNOSIS — K5900 Constipation, unspecified: Secondary | ICD-10-CM

## 2014-03-30 LAB — CBC WITH DIFFERENTIAL/PLATELET
Basophils Absolute: 0.1 10*3/uL (ref 0.0–0.1)
Basophils Relative: 1 % (ref 0–1)
EOS ABS: 0.2 10*3/uL (ref 0.0–0.7)
Eosinophils Relative: 2 % (ref 0–5)
HEMATOCRIT: 35 % — AB (ref 36.0–46.0)
Hemoglobin: 11.7 g/dL — ABNORMAL LOW (ref 12.0–15.0)
Lymphocytes Relative: 6 % — ABNORMAL LOW (ref 12–46)
Lymphs Abs: 0.5 10*3/uL — ABNORMAL LOW (ref 0.7–4.0)
MCH: 29.8 pg (ref 26.0–34.0)
MCHC: 33.4 g/dL (ref 30.0–36.0)
MCV: 89.3 fL (ref 78.0–100.0)
MPV: 8.2 fL — AB (ref 8.6–12.4)
Monocytes Absolute: 0.2 10*3/uL (ref 0.1–1.0)
Monocytes Relative: 2 % — ABNORMAL LOW (ref 3–12)
Neutro Abs: 7.7 10*3/uL (ref 1.7–7.7)
Neutrophils Relative %: 89 % — ABNORMAL HIGH (ref 43–77)
Platelets: 435 10*3/uL — ABNORMAL HIGH (ref 150–400)
RBC: 3.92 MIL/uL (ref 3.87–5.11)
RDW: 15.8 % — ABNORMAL HIGH (ref 11.5–15.5)
WBC: 8.7 10*3/uL (ref 4.0–10.5)

## 2014-03-30 MED ORDER — GABAPENTIN 300 MG PO CAPS: 300.0000 mg | ORAL_CAPSULE | Freq: Every day | ORAL | Status: DC

## 2014-03-30 MED ORDER — LORAZEPAM 1 MG PO TABS: ORAL_TABLET | ORAL | Status: DC

## 2014-03-30 NOTE — Progress Notes (Signed)
Subjective:    Patient ID: [name], female    DOB: 15-Feb-1946, 69 y.o.   MRN: [id number]  HPI A Caucasian 69yo female presents to the office for follow up on constipation and due to labs on 03/25/14 showing elevated WBC count at 11.5, sodium level at 127, chloride level at 93, creatinine at 1.18 and GFR of 47.  Liver function was normal.  Spoke to patient on 03/26/14 around 1600 and told her to stop taking Remeron, Hydroxyzine and Tramadol.  Also told patient to only take 1 capsule of Gabapentin per day and 1 tablet of Ativan per day.  Patient states she cannot stop taking tramadol completely, so she agreed to only take half of the dose per day.  Spoke with patient on 03/29/14 at 1723 and she stated that her bowel movements are more frequent and that she has been using a heating pad for back pain.  She states her bowel movements are soft formed and not diarrhea.  Patient only drinks soda and does not drink water.  Patient states she has been eating soup 1-2 times per day.  Patient states her first bowel movement was around 9am yesterday morning.  She states she went about every 2 hours.  She denies blood in stool and dark tarry stools.  She states she only took 4 packets of Miralax.  Patient states she just had 6 back injections today by Dr. Jacelyn Grip.  Patient states she does see Dr. Lynann Bologna and he prescribed Mobic, Flexeril and prednisone.  Patient has also seen Dr. Grandville Silos for hand pain.  These doctors are all at same practice per patient.  Patient states her next appt with Dr. Caryl Never is in 1 month.  Remeron- did not take it and was awake every 2 hours.  She does admit to taking one tablet at bedtime. Tramadol- Patient states she did stop taking.  Ativan-Patient states she is only taking 1 tablet per day Gabapentin- Patient states she did take 2 capsules for pain and was told to only take 1 capsule per day.  Review of Systems  Constitutional: Negative.   HENT: Negative.   Eyes: Negative.    Respiratory: Negative.   Cardiovascular: Negative.   Gastrointestinal: Negative.   Genitourinary: Negative.        Except urinary incontinence still going on.  Musculoskeletal: Positive for back pain.       Patient seeing Dr. Jacelyn Grip and Dr. Lynann Bologna for back pain.  Skin: Negative.   Neurological: Negative.   Psychiatric/Behavioral: Negative.    Past Medical History  Diagnosis Date  . Asthma   . Hypertension   . GERD (gastroesophageal reflux disease)   . Stroke     was on Plavix - not now  . Carotid artery aneurysm 03/04/2012  . DJD (degenerative joint disease)   . COPD (chronic obstructive pulmonary disease)   . Depression   . Shortness of breath    Current Outpatient Prescriptions on File Prior to Visit  Medication Sig Dispense Refill  . albuterol (PROVENTIL HFA;VENTOLIN HFA) 108 (90 BASE) MCG/ACT inhaler Inhale 2 puffs into the lungs every 4 (four) hours as needed for wheezing or shortness of breath (((PLAN B))).    Marland Kitchen aspirin EC 81 MG tablet Take 81 mg by mouth daily.     Marland Kitchen b complex vitamins tablet Take 1 tablet by mouth daily.    . bisacodyl (DULCOLAX) 5 MG EC tablet Take 10 mg by mouth daily as needed for moderate constipation.    Marland Kitchen  cholecalciferol (VITAMIN D) 1000 UNITS tablet Take 5,000 Units by mouth daily.    . cyclobenzaprine (FLEXERIL) 10 MG tablet Take 10 mg by mouth at bedtime.    . gabapentin (NEURONTIN) 300 MG capsule TAKE ONE CAPSULE BY MOUTH THREE TIMES DAILY (Patient taking differently: TAKE ONE CAPSULE BY MOUTH DAILY) 90 capsule 0  . hydrOXYzine (ATARAX/VISTARIL) 25 MG tablet Patient states she is not taking as of 03/26/14  3  . loperamide (IMODIUM A-D) 2 MG tablet Take 2 mg by mouth as needed for diarrhea or loose stools. Per box as needed for diarrhea    . LORazepam (ATIVAN) 1 MG tablet Take 1/2 or 1 tablet 3 x daily as needed for anxiety or hot flashes (Patient taking differently: Take 1 tablet daily as needed for anxiety or hot flashes) 90 tablet 5  .  Magnesium 500 MG TABS Take 500 mg by mouth daily.     . meloxicam (MOBIC) 15 MG tablet Take 15 mg by mouth daily. For 10 days    . mirtazapine (REMERON) 45 MG tablet Take 1 tablet (45 mg total) by mouth at bedtime. 90 tablet 99  . mometasone-formoterol (DULERA) 100-5 MCG/ACT AERO Inhale 2 puffs into the lungs 2 (two) times daily. 1 Inhaler 3  . montelukast (SINGULAIR) 10 MG tablet TAKE 1 TABLET BY MOUTH ONCE DAILY 90 tablet 1  . omeprazole (PRILOSEC) 40 MG capsule Take 1 capsule (40 mg total) by mouth daily. 30 capsule 3  . oxybutynin (DITROPAN) 5 MG tablet TAKE 1/2 TO 1 TABLET BY MOUTH THREE TIMES DAILY FOR BLADDER CONTROL 90 tablet 0  . polyethylene glycol (MIRALAX / GLYCOLAX) packet Take 17 g by mouth daily. 14 each 0  . QUEtiapine (SEROQUEL) 400 MG tablet TAKE 1 TABLET BY MOUTH AT BEDTIME 30 tablet 5  . quinapril (ACCUPRIL) 40 MG tablet TAKE 1 TABLET BY MOUTH AT BEDTIME 90 tablet 1  . tiotropium (SPIRIVA) 18 MCG inhalation capsule Place 18 mcg into inhaler and inhale daily.    . traMADol (ULTRAM) 50 MG tablet Take 1 tablet (50 mg total) by mouth 3 (three) times daily as needed. 90 tablet 0  . TUDORZA PRESSAIR 400 MCG/ACT AEPB Take 1 puff by mouth daily.     . vitamin C (ASCORBIC ACID) 500 MG tablet Take 500 mg by mouth daily.     No current facility-administered medications on file prior to visit.   Allergies  Allergen Reactions  . Amoxapine And Related Nausea And Vomiting  . Amoxicillin     itching  . Ciprocinonide [Fluocinolone]     Pt unsure  . Ciprofloxacin     Pt unsure  . Doxycycline Nausea And Vomiting  . Erythromycin Hives  . Sulfa Antibiotics Other (See Comments)    Unknown-reaction as a child  . Zofran [Ondansetron Hcl] Nausea And Vomiting     BP 154/92 mmHg  Temp(Src) 97.8 F (36.6 C) (Temporal)  Resp 16  Ht 5' 1.5" (1.562 m)  Wt 100 lb (45.36 kg)  BMI 18.59 kg/m2- BP Recheck - 150/72 Wt Readings from Last 3 Encounters:  03/30/14 100 lb (45.36 kg)  03/25/14 100  lb (45.36 kg)  03/04/14 100 lb 6.4 oz (45.541 kg)   Objective:   Physical Exam  Constitutional: She is oriented to person, place, and time. She appears well-developed and well-nourished. She does not have a sickly appearance. No distress.  HENT:  Head: Normocephalic.  Right Ear: Tympanic membrane, external ear and ear canal normal.  Left Ear: Tympanic  membrane, external ear and ear canal normal.  Nose: Nose normal. Right sinus exhibits no maxillary sinus tenderness and no frontal sinus tenderness. Left sinus exhibits no maxillary sinus tenderness and no frontal sinus tenderness.  Mouth/Throat: Uvula is midline, oropharynx is clear and moist and mucous membranes are normal. Mucous membranes are not pale and not dry. No trismus in the jaw. No uvula swelling. No oropharyngeal exudate, posterior oropharyngeal edema or posterior oropharyngeal erythema.  Eyes: Conjunctivae, EOM and lids are normal. Pupils are equal, round, and reactive to light. Right eye exhibits no discharge. Left eye exhibits no discharge. No scleral icterus.  Neck: Trachea normal and normal range of motion. Neck supple. No tracheal tenderness present. No tracheal deviation present.  Raspy voice  Cardiovascular: Normal rate, regular rhythm, S1 normal, S2 normal, normal heart sounds and normal pulses.  Exam reveals no gallop, no distant heart sounds and no friction rub.   No murmur heard. Pulmonary/Chest: Effort normal and breath sounds normal. No stridor. No respiratory distress. She has no decreased breath sounds. She has no wheezes. She has no rhonchi. She has no rales. She exhibits no tenderness.  Abdominal: Soft. Bowel sounds are normal. She exhibits no distension and no mass. There is no tenderness. There is no rebound and no guarding.  Lymphadenopathy:  No tenderness or LAD.  Neurological: She is alert and oriented to person, place, and time. Gait normal.  Skin: Skin is warm, dry and intact. No rash noted. She is not  diaphoretic. No pallor.  Psychiatric: She has a normal mood and affect. Her speech is normal and behavior is normal. Judgment and thought content normal.  Vitals reviewed.  Assessment & Plan:  1. Constipation, unspecified constipation type Resolved.  Patient is having normal bowel movements.  Told patient that if she is having constipation again, then she can take the Miralax again. - CBC with Differential - BASIC METABOLIC PANEL WITH GFR  2. Hyponatremia Will monitor.  Told patient to continue soup to help increase sodium.  Patient admits to not drinking a lot of water and is only having 1 cup per day of water. - CBC with Differential - BASIC METABOLIC PANEL WITH GFR  3. Encounter for long-term (current) use of medications Will monitor kidney and liver function. - CBC with Differential - BASIC METABOLIC PANEL WITH GFR  4. Urinary incontinence, unspecified incontinence type Ordered UA and urine culture due to sxs and last culture coming back contaminated.  Can wear depends.  Do not want to start new medications right now due to recent cognition issue. - Urine culture - Urinalysis, Routine w reflex microscopic  5. Impaired Cognition- secondary to overuse of medications. Told patient she can take Remeron- 1 tablet at bedtime. Told patient to STOP taking Tramadol. Told patient to take Ativan- 1 tablet per day Told patient to take Gabapentin- 1 capsule per day  Told patient that Ortho doctors need to prescribe pain medication(s).  Explained to patient that only one provider should be prescribing to help prevent confusion to patient.  Since Ortho is pain specialist, then they should be prescribing pain medications due to them treating her for chronic back pain.  Dr. Melford Aase agreed with treatment plan.  Discussed medication effects and SE's.  Pt agreed to treatment plan. If you are not feeling better in 10-14 days, then please call the office. Please keep your physical appt on  04/29/14.  Devaun Hernandez, Stephani Police, PA-C 11:53 AM [facility]

## 2014-03-30 NOTE — Patient Instructions (Addendum)
-Take Mirtazapine/Remeron at night- 1 tablet at bedtime -STOP taking Tramadol -Lorazepam/Ativan- take 1 tablet per day. -Gabapentin- take 1 capsule per day.  STOP taking Miralax for constipation.  If you experience constipation again, can take Miralax again. Continue eating soup daily.  I will call you with lab results.  Please keep physical appointment on 04/29/14  Please keep appts with Ortho doctors as they should be controlling your pain medications.   Urinary Incontinence Urinary incontinence is the involuntary loss of urine from your bladder. CAUSES  There are many causes of urinary incontinence. They include:  Medicines.  Infections.  Prostatic enlargement, leading to overflow of urine from your bladder.  Surgery.  Neurological diseases.  Emotional factors. SIGNS AND SYMPTOMS Urinary Incontinence can be divided into four types: 1. Urge incontinence. Urge incontinence is the involuntary loss of urine before you have the opportunity to go to the bathroom. There is a sudden urge to void but not enough time to reach a bathroom. 2. Stress incontinence. Stress incontinence is the sudden loss of urine with any activity that forces urine to pass. It is commonly caused by anatomical changes to the pelvis and sphincter areas of your body. 3. Overflow incontinence. Overflow incontinence is the loss of urine from an obstructed opening to your bladder. This results in a backup of urine and a resultant buildup of pressure within the bladder. When the pressure within the bladder exceeds the closing pressure of the sphincter, the urine overflows, which causes incontinence, similar to water overflowing a dam. 4. Total incontinence. Total incontinence is the loss of urine as a result of the inability to store urine within your bladder. DIAGNOSIS  Evaluating the cause of incontinence may require:  A thorough and complete medical and obstetric history.  A complete physical  exam.  Laboratory tests such as a urine culture and sensitivities. When additional tests are indicated, they can include:  An ultrasound exam.  Kidney and bladder X-rays.  Cystoscopy. This is an exam of the bladder using a narrow scope.  Urodynamic testing to test the nerve function to the bladder and sphincter areas. TREATMENT  Treatment for urinary incontinence depends on the cause:  For urge incontinence caused by a bacterial infection, antibiotics will be prescribed. If the urge incontinence is related to medicines you take, your health care provider may have you change the medicine.  For stress incontinence, surgery to re-establish anatomical support to the bladder or sphincter, or both, will often correct the condition.  For overflow incontinence caused by an enlarged prostate, an operation to open the channel through the enlarged prostate will allow the flow of urine out of the bladder. In women with fibroids, a hysterectomy may be recommended.  For total incontinence, surgery on your urinary sphincter may help. An artificial urinary sphincter (an inflatable cuff placed around the urethra) may be required. In women who have developed a hole-like passage between their bladder and vagina (vesicovaginal fistula), surgery to close the fistula often is required. HOME CARE INSTRUCTIONS  Normal daily hygiene and the use of pads or adult diapers that are changed regularly will help prevent odors and skin damage.  Avoid caffeine. It can overstimulate your bladder.  Use the bathroom regularly. Try about every 2-3 hours to go to the bathroom, even if you do not feel the need to do so. Take time to empty your bladder completely. After urinating, wait a minute. Then try to urinate again.  For causes involving nerve dysfunction, keep a log of the  medicines you take and a journal of the times you go to the bathroom. SEEK MEDICAL CARE IF:  You experience worsening of pain instead of  improvement in pain after your procedure.  Your incontinence becomes worse instead of better. SEE IMMEDIATE MEDICAL CARE IF:  You experience fever or shaking chills.  You are unable to pass your urine.  You have redness spreading into your groin or down into your thighs. MAKE SURE YOU:   Understand these instructions.   Will watch your condition.  Will get help right away if you are not doing well or get worse. Document Released: 04/19/2004 Document Revised: 12/31/2012 Document Reviewed: 08/19/2012 [facility] Patient Information 2015 [place], [place]. This information is not intended to replace advice given to you by your health care provider. Make sure you discuss any questions you have with your health care provider.

## 2014-03-31 LAB — BASIC METABOLIC PANEL WITH GFR
BUN: 9 mg/dL (ref 6–23)
CALCIUM: 8.9 mg/dL (ref 8.4–10.5)
CHLORIDE: 98 meq/L (ref 96–112)
CO2: 25 mEq/L (ref 19–32)
Creat: 0.84 mg/dL (ref 0.50–1.10)
GFR, Est African American: 81 mL/min
GFR, Est Non African American: 71 mL/min
GLUCOSE: 136 mg/dL — AB (ref 70–99)
POTASSIUM: 4.5 meq/L (ref 3.5–5.3)
Sodium: 130 mEq/L — ABNORMAL LOW (ref 135–145)

## 2014-03-31 LAB — URINALYSIS, ROUTINE W REFLEX MICROSCOPIC
Bilirubin Urine: NEGATIVE
Glucose, UA: NEGATIVE mg/dL
Hgb urine dipstick: NEGATIVE
Ketones, ur: NEGATIVE mg/dL
LEUKOCYTES UA: NEGATIVE
Nitrite: NEGATIVE
PROTEIN: NEGATIVE mg/dL
SPECIFIC GRAVITY, URINE: 1.008 (ref 1.005–1.030)
UROBILINOGEN UA: 0.2 mg/dL (ref 0.0–1.0)
pH: 5.5 (ref 5.0–8.0)

## 2014-04-01 LAB — URINE CULTURE: Colony Count: 5000

## 2014-04-02 ENCOUNTER — Other Ambulatory Visit: Payer: Self-pay | Admitting: Physician Assistant

## 2014-04-02 ENCOUNTER — Other Ambulatory Visit: Payer: Self-pay | Admitting: *Deleted

## 2014-04-02 DIAGNOSIS — R32 Unspecified urinary incontinence: Secondary | ICD-10-CM

## 2014-04-06 DIAGNOSIS — M545 Low back pain: Secondary | ICD-10-CM | POA: Diagnosis not present

## 2014-04-06 DIAGNOSIS — M6281 Muscle weakness (generalized): Secondary | ICD-10-CM | POA: Diagnosis not present

## 2014-04-06 DIAGNOSIS — M2560 Stiffness of unspecified joint, not elsewhere classified: Secondary | ICD-10-CM | POA: Diagnosis not present

## 2014-04-07 ENCOUNTER — Encounter: Payer: Self-pay | Admitting: Internal Medicine

## 2014-04-07 ENCOUNTER — Ambulatory Visit: Payer: Self-pay | Admitting: Internal Medicine

## 2014-04-07 ENCOUNTER — Ambulatory Visit (INDEPENDENT_AMBULATORY_CARE_PROVIDER_SITE_OTHER): Payer: Medicare Other | Admitting: Internal Medicine

## 2014-04-07 VITALS — BP 152/80 | HR 72 | Temp 98.1°F | Resp 16 | Ht 61.5 in | Wt 101.2 lb

## 2014-04-07 DIAGNOSIS — I10 Essential (primary) hypertension: Secondary | ICD-10-CM

## 2014-04-07 DIAGNOSIS — M549 Dorsalgia, unspecified: Secondary | ICD-10-CM

## 2014-04-07 MED ORDER — QUETIAPINE FUMARATE 400 MG PO TABS: 400.0000 mg | ORAL_TABLET | Freq: Every day | ORAL | Status: DC

## 2014-04-07 MED ORDER — LOSARTAN POTASSIUM-HCTZ 100-25 MG PO TABS: ORAL_TABLET | ORAL | Status: DC

## 2014-04-07 NOTE — Progress Notes (Signed)
Subjective:    Patient ID: [name], female    DOB: 1946-01-14, 69 y.o.   MRN: [id number]  HPI  Patient was in recently w/UT complaints & U/A was negative. Also, pt c/o LBP & is followed by Dr Lynann Bologna & Dr Jacelyn Grip with a recent session for low back trigger injections w/o any perceived benefit. She's taking Meloxicam 7 prn flexeril for her LBP. Also reports some benefit from Gabapentin. Continues with Physical therapy per Dr Lynann Bologna. Patient has questions re: review of all of her meds.   Medication Sig  . aspirin EC 81 MG tablet Take 81 mg by mouth daily.   Marland Kitchen b complex vitamins tablet Take 1 tablet by mouth daily.  . cholecalciferol (VITAMIN D) 1000 UNITS tablet Take 5,000 Units by mouth daily.  . cyclobenzaprine (FLEXERIL) 10 MG tablet Take 10 mg by mouth at bedtime.  . gabapentin (NEURONTIN) 300 MG capsule Take 1 capsule (300 mg total) by mouth daily.  Marland Kitchen loperamide (IMODIUM A-D) 2 MG tablet Take 2 mg by mouth as needed for diarrhea or loose stools. Per box as needed for diarrhea  . LORazepam (ATIVAN) 1 MG tablet Take 1 tablet QDaily as needed for anxiety or hot flashes  . Magnesium 500 MG TABS Take 500 mg by mouth daily.   . meloxicam (MOBIC) 15 MG tablet Take 15 mg by mouth daily. For 10 days  . mirtazapine (REMERON) 45 MG tablet Take 1 tablet (45 mg total) by mouth at bedtime.  . mometasone-formoterol (DULERA) 100-5 MCG/ACT AERO Inhale 2 puffs into the lungs 2 (two) times daily.  . montelukast (SINGULAIR) 10 MG tablet TAKE 1 TABLET BY MOUTH ONCE DAILY  . omeprazole (PRILOSEC) 40 MG capsule Take 1 capsule (40 mg total) by mouth daily.  Marland Kitchen oxybutynin (DITROPAN) 5 MG tablet TAKE 1/2 TO 1 TABLET BY MOUTH THREE TIMES DAILY FOR BLADDER CONTROL  . polyethylene glycol (MIRALAX / GLYCOLAX) packet Take 17 g by mouth daily.  . quinapril (ACCUPRIL) 40 MG tablet TAKE 1 TABLET BY MOUTH AT BEDTIME  . tiotropium (SPIRIVA) 18 MCG inhalation capsule Place 18 mcg into inhaler and inhale daily.  .  vitamin C (ASCORBIC ACID) 500 MG tablet Take 500 mg by mouth daily.  . QUEtiapine (SEROQUEL) 400 MG tablet TAKE 1 TABLET BY MOUTH AT BEDTIME  . albuterol (PROVENTIL HFA;VENTOLIN HFA) 108 (90 BASE) MCG/ACT inhaler Inhale 2 puffs into the lungs every 4 (four) hours as needed for wheezing or shortness of breath (((PLAN B))).  . TUDORZA PRESSAIR 400 MCG/ACT AEPB Take 1 puff by mouth daily.   . bisacodyl (DULCOLAX) 5 MG EC tablet Take 10 mg by mouth daily as needed for moderate constipation.   Allergies  Allergen Reactions  . Amoxapine And Related Nausea And Vomiting  . Amoxicillin     itching  . Ciprocinonide [Fluocinolone]     Pt unsure  . Ciprofloxacin     Pt unsure  . Doxycycline Nausea And Vomiting  . Erythromycin Hives  . Sulfa Antibiotics Other (See Comments)    Unknown-reaction as a child  . Zofran [Ondansetron Hcl] Nausea And Vomiting   Past Medical History  Diagnosis Date  . Asthma   . Hypertension   . GERD (gastroesophageal reflux disease)   . Stroke     was on Plavix - not now  . Carotid artery aneurysm 03/04/2012  . DJD (degenerative joint disease)   . COPD (chronic obstructive pulmonary disease)   . Depression   . Shortness of  breath    Past Surgical History  Procedure Laterality Date  . Video bronchoscopy  04/24/2012    Procedure: VIDEO BRONCHOSCOPY WITHOUT FLUORO;  Surgeon: Rigoberto Noel, MD;  Location: [facility];  Service: Cardiopulmonary;  Laterality: Bilateral;  . Tubal ligation  30 years  . Tonsillectomy    . Colonoscopy  2004  . Hernia repair Right 2014    inguinal  . Cataract extraction Bilateral   . Microlaryngoscopy with co2 laser and excision of vocal cord lesion N/A 06/05/2013    Procedure: MICROLARYNGOSCOPY WITH CO2 LASER AND EXCISION OF VOCAL CORD POLYPS;  Surgeon: Melida Quitter, MD;  Location: [place];  Service: ENT;  Laterality: N/A;   Review of Systems Systems review pertinent & Positive as above.    Objective:   Physical Exam BP 152/80 ->  Re-Ck'd x 2 at 174/86  Pulse 72  Temp(Src) 98.1 F (36.7 C)  Resp 16  Ht 5' 1.5" (1.562 m)  Wt 101 lb 3.2 oz   BMI 18.81  HEENT - Eac's patent. TM's Nl. EOM's full. PERRLA. NasoOroPharynx clear. Neck - supple. Nl Thyroid. Carotids 2+ & No bruits, nodes, JVD Chest - Clear equal BS w/o Rales, rhonchi, wheezes. Cor - Nl HS. RRR w/o sig MGR. PP 1(+). No edema. Abd - No palpable organomegaly, masses or tenderness. BS nl. MS- FROM w/o deformities. Muscle power, tone and bulk Nl. Gait Nl. Neuro - No obvious Cr N abnormalities. Sensory, motor and Cerebellar functions appear Nl w/o focal abnormalities. Psyche - Mental status - O x 3 & somewhat indifferent.      Assessment & Plan:    1. Essential hypertension  - losartan-hydrochlorothiazide (HYZAAR) 100-25 MG per tablet; Take 1 tablet daily for BP or as directed  Dispense: 30 tablet; Refill: 11 - Discussed med effect/SE - monitor BP's  2. Back pain, unspecified location

## 2014-04-08 ENCOUNTER — Ambulatory Visit: Payer: Self-pay | Admitting: Internal Medicine

## 2014-04-08 ENCOUNTER — Ambulatory Visit (INDEPENDENT_AMBULATORY_CARE_PROVIDER_SITE_OTHER): Payer: Medicare Other | Admitting: Podiatry

## 2014-04-08 DIAGNOSIS — M7751 Other enthesopathy of right foot: Secondary | ICD-10-CM

## 2014-04-08 DIAGNOSIS — M6281 Muscle weakness (generalized): Secondary | ICD-10-CM | POA: Diagnosis not present

## 2014-04-08 DIAGNOSIS — M545 Low back pain: Secondary | ICD-10-CM | POA: Diagnosis not present

## 2014-04-08 DIAGNOSIS — M2560 Stiffness of unspecified joint, not elsewhere classified: Secondary | ICD-10-CM | POA: Diagnosis not present

## 2014-04-09 DIAGNOSIS — M545 Low back pain: Secondary | ICD-10-CM | POA: Diagnosis not present

## 2014-04-09 NOTE — Progress Notes (Signed)
She presents today for a chief complaint of painful callus and painful toenails bilateral.  Objective: Nails are thick yellow dystrophic onychomycotic porokeratotic lesion medial aspect second toe right foot and lateral aspect fifth digit right foot.  Assessment: Porokeratosis and onychomycosis bilateral.  Plan: Debridement of nails and porokeratotic tissue today follow up with her as needed.

## 2014-04-12 DIAGNOSIS — M545 Low back pain: Secondary | ICD-10-CM | POA: Diagnosis not present

## 2014-04-12 DIAGNOSIS — M6281 Muscle weakness (generalized): Secondary | ICD-10-CM | POA: Diagnosis not present

## 2014-04-12 DIAGNOSIS — M2560 Stiffness of unspecified joint, not elsewhere classified: Secondary | ICD-10-CM | POA: Diagnosis not present

## 2014-04-14 ENCOUNTER — Ambulatory Visit: Payer: Self-pay

## 2014-04-14 ENCOUNTER — Ambulatory Visit (INDEPENDENT_AMBULATORY_CARE_PROVIDER_SITE_OTHER): Payer: Medicare Other

## 2014-04-14 VITALS — BP 122/68 | HR 88 | Temp 97.9°F | Resp 16 | Ht 61.5 in | Wt 101.0 lb

## 2014-04-14 DIAGNOSIS — I10 Essential (primary) hypertension: Secondary | ICD-10-CM

## 2014-04-14 NOTE — Progress Notes (Signed)
Patient ID: [name], female   DOB: 02-08-1946, 69 y.o.   MRN: [id number] Patient here today to have blood pressure checked. Patient started  Losartan-Hydrochlorothiazide 100-25. Patient was advised to continue medication.

## 2014-04-20 DIAGNOSIS — M6281 Muscle weakness (generalized): Secondary | ICD-10-CM | POA: Diagnosis not present

## 2014-04-20 DIAGNOSIS — M2560 Stiffness of unspecified joint, not elsewhere classified: Secondary | ICD-10-CM | POA: Diagnosis not present

## 2014-04-20 DIAGNOSIS — M545 Low back pain: Secondary | ICD-10-CM | POA: Diagnosis not present

## 2014-04-22 DIAGNOSIS — M6281 Muscle weakness (generalized): Secondary | ICD-10-CM | POA: Diagnosis not present

## 2014-04-22 DIAGNOSIS — M545 Low back pain: Secondary | ICD-10-CM | POA: Diagnosis not present

## 2014-04-22 DIAGNOSIS — M2560 Stiffness of unspecified joint, not elsewhere classified: Secondary | ICD-10-CM | POA: Diagnosis not present

## 2014-04-23 DIAGNOSIS — M2560 Stiffness of unspecified joint, not elsewhere classified: Secondary | ICD-10-CM | POA: Diagnosis not present

## 2014-04-27 DIAGNOSIS — M6281 Muscle weakness (generalized): Secondary | ICD-10-CM | POA: Diagnosis not present

## 2014-04-27 DIAGNOSIS — M545 Low back pain: Secondary | ICD-10-CM | POA: Diagnosis not present

## 2014-04-27 DIAGNOSIS — M2560 Stiffness of unspecified joint, not elsewhere classified: Secondary | ICD-10-CM | POA: Diagnosis not present

## 2014-04-28 DIAGNOSIS — M545 Low back pain: Secondary | ICD-10-CM | POA: Diagnosis not present

## 2014-04-28 DIAGNOSIS — M6281 Muscle weakness (generalized): Secondary | ICD-10-CM | POA: Diagnosis not present

## 2014-04-28 DIAGNOSIS — M2560 Stiffness of unspecified joint, not elsewhere classified: Secondary | ICD-10-CM | POA: Diagnosis not present

## 2014-04-29 ENCOUNTER — Ambulatory Visit (INDEPENDENT_AMBULATORY_CARE_PROVIDER_SITE_OTHER): Payer: Medicare Other | Admitting: Internal Medicine

## 2014-04-29 ENCOUNTER — Encounter: Payer: Self-pay | Admitting: Internal Medicine

## 2014-04-29 ENCOUNTER — Ambulatory Visit: Payer: Medicare Other | Admitting: Podiatry

## 2014-04-29 VITALS — BP 112/64 | HR 76 | Temp 98.1°F | Resp 16 | Ht 61.0 in | Wt 102.0 lb

## 2014-04-29 DIAGNOSIS — I10 Essential (primary) hypertension: Secondary | ICD-10-CM | POA: Diagnosis not present

## 2014-04-29 DIAGNOSIS — J449 Chronic obstructive pulmonary disease, unspecified: Secondary | ICD-10-CM | POA: Diagnosis not present

## 2014-04-29 DIAGNOSIS — Z1212 Encounter for screening for malignant neoplasm of rectum: Secondary | ICD-10-CM

## 2014-04-29 DIAGNOSIS — R7309 Other abnormal glucose: Secondary | ICD-10-CM | POA: Diagnosis not present

## 2014-04-29 DIAGNOSIS — R6889 Other general symptoms and signs: Secondary | ICD-10-CM

## 2014-04-29 DIAGNOSIS — Z0001 Encounter for general adult medical examination with abnormal findings: Secondary | ICD-10-CM | POA: Diagnosis not present

## 2014-04-29 DIAGNOSIS — N183 Chronic kidney disease, stage 3 unspecified: Secondary | ICD-10-CM

## 2014-04-29 DIAGNOSIS — Z1331 Encounter for screening for depression: Secondary | ICD-10-CM

## 2014-04-29 DIAGNOSIS — E559 Vitamin D deficiency, unspecified: Secondary | ICD-10-CM

## 2014-04-29 DIAGNOSIS — Z9181 History of falling: Secondary | ICD-10-CM

## 2014-04-29 DIAGNOSIS — E782 Mixed hyperlipidemia: Secondary | ICD-10-CM | POA: Diagnosis not present

## 2014-04-29 DIAGNOSIS — Z79899 Other long term (current) drug therapy: Secondary | ICD-10-CM

## 2014-04-29 DIAGNOSIS — F323 Major depressive disorder, single episode, severe with psychotic features: Secondary | ICD-10-CM

## 2014-04-29 DIAGNOSIS — R7303 Prediabetes: Secondary | ICD-10-CM

## 2014-04-29 DIAGNOSIS — Z0000 Encounter for general adult medical examination without abnormal findings: Secondary | ICD-10-CM

## 2014-04-29 LAB — CBC WITH DIFFERENTIAL/PLATELET
Basophils Absolute: 0.1 10*3/uL (ref 0.0–0.1)
Basophils Relative: 1 % (ref 0–1)
Eosinophils Absolute: 1.2 10*3/uL — ABNORMAL HIGH (ref 0.0–0.7)
Eosinophils Relative: 14 % — ABNORMAL HIGH (ref 0–5)
HCT: 35.6 % — ABNORMAL LOW (ref 36.0–46.0)
Hemoglobin: 12.4 g/dL (ref 12.0–15.0)
Lymphocytes Relative: 32 % (ref 12–46)
Lymphs Abs: 2.8 10*3/uL (ref 0.7–4.0)
MCH: 30.8 pg (ref 26.0–34.0)
MCHC: 34.8 g/dL (ref 30.0–36.0)
MCV: 88.6 fL (ref 78.0–100.0)
MONOS PCT: 15 % — AB (ref 3–12)
MPV: 8.3 fL — AB (ref 8.6–12.4)
Monocytes Absolute: 1.3 10*3/uL — ABNORMAL HIGH (ref 0.1–1.0)
NEUTROS ABS: 3.3 10*3/uL (ref 1.7–7.7)
NEUTROS PCT: 38 % — AB (ref 43–77)
PLATELETS: 344 10*3/uL (ref 150–400)
RBC: 4.02 MIL/uL (ref 3.87–5.11)
RDW: 15 % (ref 11.5–15.5)
WBC: 8.8 10*3/uL (ref 4.0–10.5)

## 2014-04-29 NOTE — Progress Notes (Signed)
Patient ID: [name], female   DOB: 11-Nov-1945, 69 y.o.   MRN: [id number]  [facility] VISIT AND CPE  Assessment:   1. Essential hypertension  - Microalbumin / creatinine urine ratio - EKG 12-Lead - [place], RETROPERITNL ABD,  LTD - TSH  2. COPD GOLD III   3. CKD (chronic kidney disease) stage 3, GFR 30-59 ml/min   4. Hyperlipidemia  - Lipid panel  5. Prediabetes  - Hemoglobin A1c - Insulin, fasting  6. Vitamin D deficiency  - Vit D  25 hydroxy (rtn osteoporosis monitoring)  7. Depression, psychotic (hx/o)    8. Medication management  - Urine Microscopic - CBC with Differential/Platelet - BASIC METABOLIC PANEL WITH GFR - Hepatic function panel - Magnesium  9. Screening for rectal cancer  - POC Hemoccult Bld/Stl (3-Cd Home Screen); Future  10. Depression screen  - negative screening.  11. At low risk for fall   12. Routine general medical examination at a health care facility   Plan:   During the course of the visit the patient was educated and counseled about appropriate screening and preventive services including:    Pneumococcal vaccine   Influenza vaccine  Td vaccine  Screening electrocardiogram  Bone densitometry screening  Colorectal cancer screening  Diabetes screening  Glaucoma screening  Nutrition counseling   Advanced directives: requested  Screening recommendations, referrals: Vaccinations: Immunization History  Administered Date(s) Administered  . Influenza Whole 12/25/2011, 12/02/2012  . Influenza, High Dose Seasonal PF 12/14/2013  . Pneumococcal Conjugate-13 12/14/2013  . Pneumococcal-Unspecified 01/29/2007, 02/02/2013  . Td 03/27/2011  . Zoster 01/23/2006   Nutrition assessed and recommended  Colonoscopy 10/2009 - Dr Thana Ates Recommended yearly ophthalmology/optometry visit for glaucoma screening and checkup Recommended yearly dental visit for hygiene and checkup Advanced directives -  declined  Conditions/risks identified: BMI: Discussed weight loss, diet, and increase physical activity.  Increase physical activity: AHA recommends 150 minutes of physical activity a week.  Medications reviewed PreDiabetes is at goal, ACE/ARB therapy: Yes. Urinary Incontinence is not an issue: discussed non pharmacology and pharmacology options.  Fall risk: low- discussed PT, home fall assessment, medications.   Subjective:   [name] is a 69 y.o.  DWF who presents for Medicare Annual Wellness Visit and complete physical.  Date of last medicare wellness visit is unknown. Patient has Moderately severe COPD and unfortunately continues to smoke albeit a pack per month. She does admit DOE with minimal exertion and has chronic hoarseness for which she has undergone ENT evaluation . She's also  followed by Dr Elsworth Soho.   This very nice 69 y.o. DWF presents for  follow up with Hypertension, Hyperlipidemia, Pre-Diabetes and Vitamin D Deficiency.    Patient is treated for HTN since 1998 & BP has been controlled at home. Today's BP: 112/64 mmHg. Patient has had no complaints of any cardiac type chest pain, palpitations, dyspnea/orthopnea/PND, dizziness, claudication, or dependent edema.   Hyperlipidemia is controlled with diet & meds. Patient denies myalgias or other med SE's. Last Lipids were at goal - Total Chol 121; HDL 55; LDL 35; Trig 157 on 01/25/2014.   Also, the patient has history of PreDiabetes with A1c 5.9% since Jan 2012 and has had no symptoms of reactive hypoglycemia, diabetic polys, paresthesias or visual blurring.  Last A1c was 5.9% on 01/25/2014.   Further, the patient also has history of Vitamin D Deficiency and supplements vitamin D without any suspected side-effects. Last vitamin D was 44 on  01/25/2014.   Names  of Other Physician/Practitioners you currently use: 1. [facility] here for primary care 2. Dr Dolan/[facility]Trader, eye doctor, last visit  Oct 2015 3. [facility], dentist, last visit 2014  Patient Care Team: Unk Pinto, MD as PCP - General (Internal Medicine) Seeplaputhur Robinette Haines, MD (General Surgery) Unk Pinto, MD as Consulting Physician (Internal Medicine) Winfield Cunas., MD as Consulting Physician (Gastroenterology)  Medication Review: Medication Sig  . albuterol (PROVENTIL HFA;VENTOLIN HFA) 108 (90 BASE) MCG/ACT inhaler Inhale 2 puffs into the lungs every 4 (four) hours as needed for wheezing or shortness of breath (((PLAN B))).  Marland Kitchen aspirin EC 81 MG tablet Take 81 mg by mouth daily.   Marland Kitchen b complex vitamins tablet Take 1 tablet by mouth daily.  . cholecalciferol (VITAMIN D) 1000 UNITS tablet Take 5,000 Units by mouth daily.  . cyclobenzaprine (FLEXERIL) 10 MG tablet Take 10 mg by mouth at bedtime.  . gabapentin (NEURONTIN) 300 MG capsule Take 1 capsule (300 mg total) by mouth daily.  . Iron-Vitamin C (VITRON-C PO) Take 1 tablet by mouth. Takes every other day  . loperamide (IMODIUM A-D) 2 MG tablet Take 2 mg by mouth as needed for diarrhea or loose stools. Per box as needed for diarrhea  . LORazepam (ATIVAN) 1 MG tablet Take 1 tablet QDaily as needed for anxiety or hot flashes  . losartan-hydrochlorothiazide (HYZAAR) 100-25 MG per tablet Take 1 tablet daily for BP or as directed  . Magnesium 500 MG TABS Take 500 mg by mouth daily.   . meloxicam (MOBIC) 15 MG tablet Take 15 mg by mouth daily. For 10 days  . mirtazapine (REMERON) 45 MG tablet Take 1 tablet (45 mg total) by mouth at bedtime.  . mometasone-formoterol (DULERA) 100-5 MCG/ACT AERO Inhale 2 puffs into the lungs 2 (two) times daily. (Patient not taking: Reported on 04/29/2014)  . montelukast (SINGULAIR) 10 MG tablet TAKE 1 TABLET BY MOUTH ONCE DAILY  . omeprazole (PRILOSEC) 40 MG capsule Take 1 capsule (40 mg total) by mouth daily.  Marland Kitchen oxybutynin (DITROPAN) 5 MG tablet TAKE 1/2 TO 1 TABLET BY MOUTH THREE TIMES DAILY FOR BLADDER  CONTROL  . polyethylene glycol (MIRALAX / GLYCOLAX) packet Take 17 g by mouth daily.  . QUEtiapine (SEROQUEL) 400 MG tablet Take 1 tablet (400 mg total) by mouth at bedtime.  . quinapril (ACCUPRIL) 40 MG tablet TAKE 1 TABLET BY MOUTH AT BEDTIME  . tiotropium (SPIRIVA) 18 MCG inhalation capsule Place 18 mcg into inhaler and inhale daily.  . traMADol (ULTRAM) 50 MG tablet   . TUDORZA PRESSAIR 400 MCG/ACT AEPB Take 1 puff by mouth daily.   . vitamin C (ASCORBIC ACID) 500 MG tablet Take 500 mg by mouth daily.   Current Problems (verified) Patient Active Problem List   Diagnosis Date Noted  . Impaired cognition 03/30/2014  . Vitamin D deficiency 08/18/2013  . Hyperlipidemia 08/18/2013  . Prediabetes 08/18/2013  . Medication management 08/18/2013  . CKD (chronic kidney disease) stage 3, GFR 30-59 ml/min 07/01/2013  . Anemia of chronic disease 07/01/2013  . Depression, psychotic 06/30/2013  . HTN 02/09/2013  . COPD GOLD III 03/28/2012  . Vocal cord dysfunction 03/28/2012  . Carotid artery aneurysm 03/04/2012  . Smoker 03/04/2012   Screening Tests Health Maintenance  Topic Date Due  . INFLUENZA VACCINE  10/25/2014  . MAMMOGRAM  03/25/2016  . COLONOSCOPY  11/12/2019  . TETANUS/TDAP  03/26/2021  . DEXA SCAN  Completed  . PNEUMOCOCCAL  POLYSACCHARIDE VACCINE AGE 36 AND OVER  Completed  . ZOSTAVAX  Completed   Immunization History  Administered Date(s) Administered  . Influenza Whole 12/25/2011, 12/02/2012  . Influenza, High Dose Seasonal PF 12/14/2013  . Pneumococcal Conjugate-13 12/14/2013  . Pneumococcal-Unspecified 01/29/2007, 02/02/2013  . Td 03/27/2011  . Zoster 01/23/2006   Preventative care: Last colonoscopy: 10/2009 - Dr Thana Ates  History reviewed: allergies, current medications, past family history, past medical history, past social history, past surgical history and problem list  Risk Factors: Tobacco History  Substance Use Topics  . Smoking status: Current Every  Day Smoker -- 0.25 packs/day for 30 years    Types: Cigarettes  . Smokeless tobacco: Never Used     Comment: pt smokes 2 cigs daily 06/17/13  . Alcohol Use: No   She does smoke.  Patient has been a smoker x 40+ years. Are there smokers in your home (other than you)?  No  Alcohol Current alcohol use: none  Caffeine Current caffeine use: coffee 0-2 cups /day, tea 2 glasses /day and caffeinated soft drinks 5 cokes /day  Exercise Current exercise: none  Nutrition/Diet Current diet: in general, a "healthy" diet    Cardiac risk factors: advanced age (older than 32 for men, 28 for women), dyslipidemia, hypertension, sedentary lifestyle and smoking/ tobacco exposure.  Depression Screen (Note: if answer to either of the following is "Yes", a more complete depression screening is indicated)   Q1: Over the past two weeks, have you felt down, depressed or hopeless? No  Q2: Over the past two weeks, have you felt little interest or pleasure in doing things? No  Have you lost interest or pleasure in daily life? No  Do you often feel hopeless? No  Do you cry easily over simple problems? No  Activities of Daily Living In your present state of health, do you have any difficulty performing the following activities?:  Driving? No Managing money?  No Feeding yourself? No Getting from bed to chair? No Climbing a flight of stairs? No Preparing food and eating?: No Bathing or showering? No Getting dressed: No Getting to the toilet? No Using the toilet:No Moving around from place to place: No In the past year have you fallen or had a near fall?:No   Are you sexually active?  No  Do you have more than one partner?  No  Vision Difficulties: No  Hearing Difficulties: No Do you often ask people to speak up or repeat themselves? No Do you experience ringing or noises in your ears? No Do you have difficulty understanding soft or whispered voices? Sometimes.  Cognition  Do you feel that you  have a problem with memory?No  Do you often misplace items? No  Do you feel safe at home?  Yes  Advanced directives Does patient have a [place]? No - recommended forms Does patient have a Living Will? No- recommended forms  ROS: In addition to the HPI above,  No Fever-chills,  No Headache, No changes with Vision or hearing,  No problems swallowing food or Liquids,  No Chest pain,  No Abdominal pain, No Nausea or Vomitting, Bowel movements are regular,  No Blood in stool or Urine,  No dysuria,  No new skin rashes or bruises,  No new joints pains-aches,  No new weakness, tingling, numbness in any extremity,  No recent weight loss,  No polyuria, polydypsia or polyphagia,  No significant Mental Stressors.  A full 10 point Review of Systems was done, except  as stated above, all other Review of Systems were negative  Objective:     Blood pressure 112/64, pulse 76, temperature 98.1 F (36.7 C), resp. rate 16, height 5\' 1"  (1.549 m), weight 102 lb (46.267 kg). Body mass index is 19.28 kg/(m^2). O2 Sat 92 % at rest on room air.  General appearance: alert, no distress, WD/WN, female Cognitive Testing  Alert? Yes  Normal Appearance?Yes  Oriented to person? Yes  Place? Yes   Time? Yes  Recall of three objects?  Yes  Can perform simple calculations? Yes  Displays appropriate judgment? Yes  Can read the correct time from a watch/clock?Yes  HEENT: normocephalic, sclerae anicteric, TMs pearly, nares patent, no discharge or erythema, pharynx normal Oral cavity: MMM, no lesions Neck: supple, no lymphadenopathy, no thyromegaly, no masses Heart: RRR, normal S1, S2, no murmurs Lungs: CTA bilaterally, no wheezes, rhonchi, or rales Abdomen: +bs, soft, non tender, non distended, no masses, no hepatomegaly, no splenomegaly Musculoskeletal: nontender, no swelling, no obvious deformity Extremities: no edema, no cyanosis, no clubbing Pulses: 2+ symmetric, upper and lower  extremities, normal cap refill Neurological: alert, oriented x 3, CN2-12 intact, strength normal upper extremities and lower extremities, sensation normal throughout, DTRs 2+ throughout, no cerebellar signs, gait normal Psychiatric: normal affect, behavior normal, pleasant   Medicare Attestation I have personally reviewed: The patient's medical and social history Their use of alcohol, tobacco or illicit drugs Their current medications and supplements The patient's functional ability including ADLs,fall risks, home safety risks, cognitive, and hearing and visual impairment Diet and physical activities Evidence for depression or mood disorders  The patient's weight, height, BMI, and visual acuity have been recorded in the chart.  I have made referrals, counseling, and provided education to the patient based on review of the above and I have provided the patient with a written personalized care plan for preventive services.    Riyah Bardon DAVID, MD   04/29/2014

## 2014-04-29 NOTE — Patient Instructions (Signed)

## 2014-04-30 LAB — URINALYSIS, MICROSCOPIC ONLY
Bacteria, UA: NONE SEEN
CRYSTALS: NONE SEEN
Casts: NONE SEEN
SQUAMOUS EPITHELIAL / LPF: NONE SEEN

## 2014-04-30 LAB — HEPATIC FUNCTION PANEL
ALK PHOS: 74 U/L (ref 39–117)
AST: 17 U/L (ref 0–37)
Albumin: 3.7 g/dL (ref 3.5–5.2)
BILIRUBIN DIRECT: 0.1 mg/dL (ref 0.0–0.3)
BILIRUBIN TOTAL: 0.3 mg/dL (ref 0.2–1.2)
Indirect Bilirubin: 0.2 mg/dL (ref 0.2–1.2)
Total Protein: 5.9 g/dL — ABNORMAL LOW (ref 6.0–8.3)

## 2014-04-30 LAB — LIPID PANEL
Cholesterol: 125 mg/dL (ref 0–200)
HDL: 63 mg/dL (ref >39)
LDL CALC: 37 mg/dL (ref 0–99)
TRIGLYCERIDES: 124 mg/dL (ref <150)
Total CHOL/HDL Ratio: 2 Ratio
VLDL: 25 mg/dL (ref 0–40)

## 2014-04-30 LAB — MICROALBUMIN / CREATININE URINE RATIO
CREATININE, URINE: 77.2 mg/dL
MICROALB UR: 0.2 mg/dL (ref <2.0)
Microalb Creat Ratio: 2.6 mg/g (ref 0.0–30.0)

## 2014-04-30 LAB — BASIC METABOLIC PANEL WITH GFR
BUN: 18 mg/dL (ref 6–23)
CALCIUM: 9.2 mg/dL (ref 8.4–10.5)
CO2: 25 mEq/L (ref 19–32)
CREATININE: 1.3 mg/dL — AB (ref 0.50–1.10)
Chloride: 90 mEq/L — ABNORMAL LOW (ref 96–112)
GFR, EST AFRICAN AMERICAN: 48 mL/min — AB
GFR, Est Non African American: 42 mL/min — ABNORMAL LOW
GLUCOSE: 109 mg/dL — AB (ref 70–99)
Potassium: 4.9 mEq/L (ref 3.5–5.3)
SODIUM: 125 meq/L — AB (ref 135–145)

## 2014-04-30 LAB — HEMOGLOBIN A1C
HEMOGLOBIN A1C: 6 % — AB (ref <5.7)
MEAN PLASMA GLUCOSE: 126 mg/dL — AB (ref <117)

## 2014-04-30 LAB — TSH: TSH: 3.048 u[IU]/mL (ref 0.350–4.500)

## 2014-04-30 LAB — VITAMIN D 25 HYDROXY (VIT D DEFICIENCY, FRACTURES): VIT D 25 HYDROXY: 75 ng/mL (ref 30–100)

## 2014-04-30 LAB — MAGNESIUM: MAGNESIUM: 2 mg/dL (ref 1.5–2.5)

## 2014-04-30 LAB — INSULIN, FASTING: Insulin fasting, serum: 24.4 u[IU]/mL — ABNORMAL HIGH (ref 2.0–19.6)

## 2014-05-04 ENCOUNTER — Ambulatory Visit (INDEPENDENT_AMBULATORY_CARE_PROVIDER_SITE_OTHER): Payer: Medicare Other | Admitting: Internal Medicine

## 2014-05-04 ENCOUNTER — Ambulatory Visit: Payer: Self-pay | Admitting: Internal Medicine

## 2014-05-04 ENCOUNTER — Ambulatory Visit: Payer: Medicare Other | Admitting: Podiatry

## 2014-05-04 ENCOUNTER — Other Ambulatory Visit: Payer: Self-pay | Admitting: Internal Medicine

## 2014-05-04 ENCOUNTER — Encounter: Payer: Self-pay | Admitting: Internal Medicine

## 2014-05-04 VITALS — BP 114/66 | HR 88 | Temp 97.6°F | Resp 16 | Ht 61.0 in | Wt 101.6 lb

## 2014-05-04 DIAGNOSIS — E871 Hypo-osmolality and hyponatremia: Secondary | ICD-10-CM

## 2014-05-04 DIAGNOSIS — M5442 Lumbago with sciatica, left side: Secondary | ICD-10-CM | POA: Diagnosis not present

## 2014-05-04 DIAGNOSIS — I10 Essential (primary) hypertension: Secondary | ICD-10-CM | POA: Diagnosis not present

## 2014-05-04 DIAGNOSIS — M5441 Lumbago with sciatica, right side: Secondary | ICD-10-CM

## 2014-05-04 LAB — BASIC METABOLIC PANEL
BUN: 13 mg/dL (ref 6–23)
CO2: 28 mEq/L (ref 19–32)
Calcium: 9.1 mg/dL (ref 8.4–10.5)
Chloride: 90 mEq/L — ABNORMAL LOW (ref 96–112)
Creat: 1.18 mg/dL — ABNORMAL HIGH (ref 0.50–1.10)
GLUCOSE: 84 mg/dL (ref 70–99)
POTASSIUM: 4.3 meq/L (ref 3.5–5.3)
SODIUM: 124 meq/L — AB (ref 135–145)

## 2014-05-04 MED ORDER — TRAMADOL HCL 50 MG PO TABS: ORAL_TABLET | ORAL | Status: DC

## 2014-05-04 MED ORDER — LOSARTAN POTASSIUM 100 MG PO TABS: 100.0000 mg | ORAL_TABLET | Freq: Every day | ORAL | Status: DC

## 2014-05-04 MED ORDER — TRAMADOL HCL 50 MG PO TABS: 50.0000 mg | ORAL_TABLET | Freq: Two times a day (BID) | ORAL | Status: DC

## 2014-05-04 NOTE — Patient Instructions (Signed)
  Stop Losartan - Hydrochlorothiazide  & start Losartin 100 mg daily  Hyponatremia  Hyponatremia is when the amount of salt (sodium) in your blood is too low. When sodium levels are low, your cells will absorb extra water and swell. The swelling happens throughout the body, but it mostly affects the brain. Severe brain swelling (cerebral edema), seizures, or coma can happen.  CAUSES   Heart, kidney, or liver problems.  Thyroid problems.  Adrenal gland problems.  Severe vomiting and diarrhea.  Certain medicines or illegal drugs.  Dehydration.  Drinking too much water.  Low-sodium diet. SYMPTOMS   Nausea and vomiting.  Confusion.  Lethargy.  Agitation.  Headache.  Twitching or shaking (seizures).  Unconsciousness.  Appetite loss.  Muscle weakness and cramping. DIAGNOSIS  Hyponatremia is identified by a simple blood test. Your caregiver will perform a history and physical exam to try to find the cause and type of hyponatremia. Other tests may be needed to measure the amount of sodium in your blood and urine. TREATMENT  Treatment will depend on the cause.   Fluids may be given through the vein (IV).  Medicines may be used to correct the sodium imbalance. If medicines are causing the problem, they will need to be adjusted.  Water or fluid intake may be restricted to restore proper balance. The speed of correcting the sodium problem is very important. If the problem is corrected too fast, nerve damage (sometimes unchangeable) can happen. HOME CARE INSTRUCTIONS   Only take medicines as directed by your caregiver. Many medicines can make hyponatremia worse. Discuss all your medicines with your caregiver.  Carefully follow any recommended diet, including any fluid restrictions.  You may be asked to repeat lab tests. Follow these directions.  Avoid alcohol and recreational drugs. SEEK MEDICAL CARE IF:   You develop worsening nausea, fatigue, headache, confusion,  or weakness.  Your original hyponatremia symptoms return.  You have problems following the recommended diet. SEEK IMMEDIATE MEDICAL CARE IF:   You have a seizure.  You faint.  You have ongoing diarrhea or vomiting. MAKE SURE YOU:   Understand these instructions.  Will watch your condition.  Will get help right away if you are not doing well or get worse. Document Released: 03/02/2002 Document Revised: 06/04/2011 Document Reviewed: 08/27/2010 [facility] Patient Information 2015 [place], [place]. This information is not intended to replace advice given to you by your health care provider. Make sure you discuss any questions you have with your health care provider.

## 2014-05-04 NOTE — Progress Notes (Signed)
Subjective:    Patient ID: [name], female    DOB: 10-30-1945, 69 y.o.   MRN: [id number]  HPI  Patient presents for f/u with hx/o prior hyponatremia and current Na 124. No c/o HA's , dizziness or other cardiac sx's.  Medication Sig  . aspirin EC 81 MG tablet Take 81 mg by mouth daily.   Marland Kitchen b complex vitamins tablet Take 1 tablet by mouth daily.  Marland Kitchen VITAMIN D Take 5,000 Units by mouth daily.  . cyclobenzaprine  10 MG tablet Take 10 mg by mouth at bedtime.  . gabapentin  300 MG capsule Take 1 cap daily.  . Iron-Vitamin C  Take 1 tablet  every other day  . Loperamide 2 MG tablet Take 2 mg  as needed for diarrhea or loose stools  . LORazepam 1 MG tablet Take 1 tablet QDaily as needed for anxiety or hot flashes  . Magnesium 500 MG TABS Take 500 mg by mouth daily.   . meloxicam (15 MG tablet Take 15 mg by mouth daily. For 10 days  . mirtazapine  45 MG tablet Take 1 tablet (45 mg total) by mouth at bedtime.  Marland Kitchen omeprazole 40 MG capsule Take 1 capsule (40 mg total) by mouth daily.  Marland Kitchen oxybutynin  5 MG tablet TAKE 1/2 TO 1 TAB THREE TIMES DAILY   . polyethylene glycol (MIRALAX / GLYCOLAX) packet Take 17 g by mouth daily.  . QUEtiapine  400 MG tablet Take 1 tablet (400 mg total) by mouth at bedtime.  . quinapril 40 MG tablet TAKE 1 TAB AT BEDTIME  . TUDORZA PRESSAIR  Take 1 puff by mouth daily.   . vitamin C  500 MG tablet Take 500 mg by mouth daily.  Marland Kitchen losartan-hydrochlorothiazide (HYZAAR) 100-25 MG per tablet Take 1 tablet daily for BP or as directed  . traMADol (ULTRAM) 50 MG tablet   . Albuterol- HFA) inhaler 2 puffs  every 4 (four) hours as needed  . mometasone-formoterol (DULERA) 100-5  Inhale 2 puffs into the lungs 2 (two) times daily. (Patient not taking: Reported on 05/04/2014)  . montelukast  10 MG tablet TAKE 1 TABONCE DAILY (Patient not taking  . tiotropium (SPIRIVA) inhale daily.   Allergies  Allergen Reactions  . Amoxapine And Related Nausea And Vomiting  . Amoxicillin    itching  . Ciprocinonide [Fluocinolone]     Pt unsure  . Ciprofloxacin     Pt unsure  . Doxycycline Nausea And Vomiting  . Erythromycin Hives  . Sulfa Antibiotics Other (See Comments)    Unknown-reaction as a child  . Zofran [Ondansetron Hcl] Nausea And Vomiting   Review of Systems  10 point systems review - Non contributory to above     Objective:   Physical Exam BP 114/66 mmHg  Pulse 88  Temp(Src) 97.6 F (36.4 C)  Resp 16  Ht 5\' 1"  (1.549 m)  Wt 101 lb 9.6 oz (46.085 kg)  BMI 19.21 kg/m2  HEENT - Eac's patent. TM's Nl. EOM's full. PERRLA. NasoOroPharynx clear. Neck - supple. Nl Thyroid. Carotids 2+ & No bruits, nodes, JVD Chest - Clear equal BS w/o Rales, rhonchi, wheezes. Cor - Nl HS. RRR w/o sig MGR. PP 1(+). No edema. Abd - No palpable organomegaly, masses or tenderness. BS nl. MS- FROM w/o deformities. Muscle power, tone and bulk Nl. Gait Nl. Neuro - No obvious Cr N abnormalities. Sensory, motor and Cerebellar functions appear Nl w/o focal abnormalities. Psyche - Mental status normal &  appropriate.  No delusions, ideations or obvious mood abnormalities.    Assessment & Plan:   1. Hyponatremia  - D/C Hyzaar and start Losartin 100 mg w/o Hctz - Basic metabolic panel - Sodium, urine, random - Cortisol  2. Essential hypertension   3. Bilateral low back pain with sciatica, sciatica laterality unspecified  - traMADol (ULTRAM) 50 MG tablet; Take 1 tablet 2 x day if needed for pain  Dispense: 60 tablet; Refill: 0

## 2014-05-05 LAB — SODIUM, URINE, RANDOM: Sodium, Ur: 48 mEq/L

## 2014-05-05 LAB — CORTISOL: CORTISOL PLASMA: 13.7 ug/dL

## 2014-05-06 DIAGNOSIS — M2560 Stiffness of unspecified joint, not elsewhere classified: Secondary | ICD-10-CM | POA: Diagnosis not present

## 2014-05-06 DIAGNOSIS — M545 Low back pain: Secondary | ICD-10-CM | POA: Diagnosis not present

## 2014-05-06 DIAGNOSIS — M6281 Muscle weakness (generalized): Secondary | ICD-10-CM | POA: Diagnosis not present

## 2014-05-09 ENCOUNTER — Other Ambulatory Visit: Payer: Self-pay | Admitting: Internal Medicine

## 2014-05-11 ENCOUNTER — Ambulatory Visit (INDEPENDENT_AMBULATORY_CARE_PROVIDER_SITE_OTHER): Payer: Medicare Other | Admitting: Podiatry

## 2014-05-11 DIAGNOSIS — B351 Tinea unguium: Secondary | ICD-10-CM | POA: Diagnosis not present

## 2014-05-11 DIAGNOSIS — M79673 Pain in unspecified foot: Secondary | ICD-10-CM

## 2014-05-11 DIAGNOSIS — M6281 Muscle weakness (generalized): Secondary | ICD-10-CM | POA: Diagnosis not present

## 2014-05-11 DIAGNOSIS — M2560 Stiffness of unspecified joint, not elsewhere classified: Secondary | ICD-10-CM | POA: Diagnosis not present

## 2014-05-11 DIAGNOSIS — M545 Low back pain: Secondary | ICD-10-CM | POA: Diagnosis not present

## 2014-05-11 NOTE — Progress Notes (Signed)
She presents today with chief complaint of painful elongated toenails.  Objective: Pulses are strongly palpable bilateral. Nails are thick yellow dystrophic with mycotic and painful palpation.  Assessment: Pain in limb secondary to onychomycosis 1 through 5 bilateral.

## 2014-05-13 DIAGNOSIS — M6281 Muscle weakness (generalized): Secondary | ICD-10-CM | POA: Diagnosis not present

## 2014-05-13 DIAGNOSIS — M545 Low back pain: Secondary | ICD-10-CM | POA: Diagnosis not present

## 2014-05-13 DIAGNOSIS — M2560 Stiffness of unspecified joint, not elsewhere classified: Secondary | ICD-10-CM | POA: Diagnosis not present

## 2014-05-22 ENCOUNTER — Other Ambulatory Visit: Payer: Self-pay | Admitting: Internal Medicine

## 2014-05-24 ENCOUNTER — Ambulatory Visit (INDEPENDENT_AMBULATORY_CARE_PROVIDER_SITE_OTHER): Payer: Medicare Other | Admitting: Internal Medicine

## 2014-05-24 ENCOUNTER — Encounter: Payer: Self-pay | Admitting: Internal Medicine

## 2014-05-24 VITALS — BP 124/76 | HR 92 | Temp 98.8°F | Resp 16 | Ht 61.0 in | Wt 101.8 lb

## 2014-05-24 DIAGNOSIS — I10 Essential (primary) hypertension: Secondary | ICD-10-CM

## 2014-05-24 DIAGNOSIS — E871 Hypo-osmolality and hyponatremia: Secondary | ICD-10-CM

## 2014-05-24 NOTE — Progress Notes (Signed)
   Subjective:    Patient ID: [name], female    DOB: 1945-08-10, 69 y.o.   MRN: [id number]  HPI Patient returns for f/u and recheck of electrolytes and ur Na  For evaluating hyponatremia. She recently had HCTZ removed from her BP med. Recent Urine Na seemed inappropriate high, but was most likely due to her diuretic. Serum cortisol was Nl.Patient is relatively assx and 10 pt sys review is neg. Review of Systems    Objective:   Physical Exam   BP 124/76 mmHg  Pulse 92  Temp(Src) 98.8 F (37.1 C)  Resp 16  Ht 5\' 1"  (1.549 m)  Wt 101 lb 12.8 oz (46.176 kg)  BMI 19.24 kg/m2   HEENT - Eac's patent. TM's Nl. EOM's full. PERRLA. NasoOroPharynx clear. Neck - supple. Nl Thyroid. Carotids 2+ & No bruits, nodes, JVD Chest - Clear equal BS w/o Rales, rhonchi, wheezes. Cor - Nl HS. RRR w/o sig MGR. PP 1(+). No edema. Abd - No palpable organomegaly, masses or tenderness. BS nl. MS- FROM w/o deformities. Muscle power, tone and bulk Nl. Gait Nl. Neuro - No obvious Cr N abnormalities. Sensory, motor and Cerebellar functions appear Nl w/o focal abnormalities. Psyche - Mental status normal & appropriate.    Assessment & Plan:   1. Hyponatremia  - BASIC METABOLIC PANEL WITH GFR - Urine Microscopic - Sodium, Urine  2. Essential hypertension

## 2014-05-24 NOTE — Patient Instructions (Signed)
Hyponatremia  Hyponatremia is when the amount of salt (sodium) in your blood is too low. When sodium levels are low, your cells will absorb extra water and swell. The swelling happens throughout the body, but it mostly affects the brain. Severe brain swelling (cerebral edema), seizures, or coma can happen.  CAUSES   Heart, kidney, or liver problems.  Thyroid problems.  Adrenal gland problems.  Severe vomiting and diarrhea.  Certain medicines or illegal drugs.  Dehydration.  Drinking too much water.  Low-sodium diet. SYMPTOMS   Nausea and vomiting.  Confusion.  Lethargy.  Agitation.  Headache.  Twitching or shaking (seizures).  Unconsciousness.  Appetite loss.  Muscle weakness and cramping. DIAGNOSIS  Hyponatremia is identified by a simple blood test. Your caregiver will perform a history and physical exam to try to find the cause and type of hyponatremia. Other tests may be needed to measure the amount of sodium in your blood and urine. TREATMENT  Treatment will depend on the cause.   Fluids may be given through the vein (IV).  Medicines may be used to correct the sodium imbalance. If medicines are causing the problem, they will need to be adjusted.  Water or fluid intake may be restricted to restore proper balance. The speed of correcting the sodium problem is very important. If the problem is corrected too fast, nerve damage (sometimes unchangeable) can happen. HOME CARE INSTRUCTIONS   Only take medicines as directed by your caregiver. Many medicines can make hyponatremia worse. Discuss all your medicines with your caregiver.  Carefully follow any recommended diet, including any fluid restrictions.  You may be asked to repeat lab tests. Follow these directions.  Avoid alcohol and recreational drugs. SEEK MEDICAL CARE IF:   You develop worsening nausea, fatigue, headache, confusion, or weakness.  Your original hyponatremia symptoms return.  You have  problems following the recommended diet. SEEK IMMEDIATE MEDICAL CARE IF:   You have a seizure.  You faint.  You have ongoing diarrhea or vomiting. MAKE SURE YOU:   Understand these instructions.  Will watch your condition.  Will get help right away if you are not doing well or get worse.   Hyponatremia  Hyponatremia is when the salt (sodium) in your blood is low. When salt becomes low, your cells take in extra water and puff up (swell). The puffiness can happen in the whole body. It mostly affects the brain and is very serious.  HOME CARE  Only take medicine as told by your doctor.  Follow any diet instructions you were given. This includes limiting how much fluid you drink.  Keep all doctor visits for tests as told.  Avoid alcohol and drugs. GET HELP RIGHT AWAY IF:  You start to twitch and shake (seize).  You pass out (faint).  You continue to have watery poop (diarrhea) or you throw up (vomit).  You feel sick to your stomach (nauseous).  You are tired (fatigued), have a headache, are confused, or feel weak.  Your problems that first brought you to the doctor come back.  You have trouble following your diet instructions. MAKE SURE YOU:   Understand these instructions.  Will watch your condition.  Will get help right away if you are not doing well or get worse. Document Released: 11/22/2010 Document Revised: 06/04/2011 Document Reviewed: 11/22/2010 [facility] Patient Information 2015 [place], [place]. This information is not intended to replace advice given to you by your health care provider. Make sure you discuss any questions you have with your health  care provider.  

## 2014-05-25 DIAGNOSIS — M6281 Muscle weakness (generalized): Secondary | ICD-10-CM | POA: Diagnosis not present

## 2014-05-25 DIAGNOSIS — M545 Low back pain: Secondary | ICD-10-CM | POA: Diagnosis not present

## 2014-05-25 DIAGNOSIS — M2560 Stiffness of unspecified joint, not elsewhere classified: Secondary | ICD-10-CM | POA: Diagnosis not present

## 2014-05-25 LAB — BASIC METABOLIC PANEL WITH GFR
BUN: 9 mg/dL (ref 6–23)
CALCIUM: 8.9 mg/dL (ref 8.4–10.5)
CO2: 26 mEq/L (ref 19–32)
Chloride: 105 mEq/L (ref 96–112)
Creat: 1.02 mg/dL (ref 0.50–1.10)
GFR, EST AFRICAN AMERICAN: 64 mL/min
GFR, Est Non African American: 56 mL/min — ABNORMAL LOW
Glucose, Bld: 95 mg/dL (ref 70–99)
POTASSIUM: 4.3 meq/L (ref 3.5–5.3)
Sodium: 139 mEq/L (ref 135–145)

## 2014-05-25 LAB — SODIUM, URINE, RANDOM: Sodium, Ur: 57 mEq/L

## 2014-05-25 LAB — URINALYSIS, MICROSCOPIC ONLY
Bacteria, UA: NONE SEEN
Casts: NONE SEEN
Crystals: NONE SEEN
Squamous Epithelial / LPF: NONE SEEN

## 2014-05-27 DIAGNOSIS — M6281 Muscle weakness (generalized): Secondary | ICD-10-CM | POA: Diagnosis not present

## 2014-05-27 DIAGNOSIS — M2560 Stiffness of unspecified joint, not elsewhere classified: Secondary | ICD-10-CM | POA: Diagnosis not present

## 2014-05-27 DIAGNOSIS — M545 Low back pain: Secondary | ICD-10-CM | POA: Diagnosis not present

## 2014-05-30 ENCOUNTER — Other Ambulatory Visit: Payer: Self-pay | Admitting: Internal Medicine

## 2014-06-01 ENCOUNTER — Ambulatory Visit: Payer: Medicare Other | Admitting: Podiatry

## 2014-06-01 DIAGNOSIS — M2560 Stiffness of unspecified joint, not elsewhere classified: Secondary | ICD-10-CM | POA: Diagnosis not present

## 2014-06-01 DIAGNOSIS — M6281 Muscle weakness (generalized): Secondary | ICD-10-CM | POA: Diagnosis not present

## 2014-06-01 DIAGNOSIS — M545 Low back pain: Secondary | ICD-10-CM | POA: Diagnosis not present

## 2014-06-03 DIAGNOSIS — M6281 Muscle weakness (generalized): Secondary | ICD-10-CM | POA: Diagnosis not present

## 2014-06-03 DIAGNOSIS — M545 Low back pain: Secondary | ICD-10-CM | POA: Diagnosis not present

## 2014-06-03 DIAGNOSIS — M2560 Stiffness of unspecified joint, not elsewhere classified: Secondary | ICD-10-CM | POA: Diagnosis not present

## 2014-06-06 ENCOUNTER — Other Ambulatory Visit: Payer: Self-pay | Admitting: Internal Medicine

## 2014-06-06 DIAGNOSIS — M549 Dorsalgia, unspecified: Secondary | ICD-10-CM

## 2014-06-08 ENCOUNTER — Ambulatory Visit: Payer: Medicare Other | Admitting: Podiatry

## 2014-06-10 ENCOUNTER — Encounter: Payer: Self-pay | Admitting: Podiatry

## 2014-06-10 ENCOUNTER — Ambulatory Visit (INDEPENDENT_AMBULATORY_CARE_PROVIDER_SITE_OTHER): Payer: Medicare Other | Admitting: Podiatry

## 2014-06-10 ENCOUNTER — Ambulatory Visit: Payer: Self-pay | Admitting: Internal Medicine

## 2014-06-10 DIAGNOSIS — M6281 Muscle weakness (generalized): Secondary | ICD-10-CM | POA: Diagnosis not present

## 2014-06-10 DIAGNOSIS — M545 Low back pain: Secondary | ICD-10-CM | POA: Diagnosis not present

## 2014-06-10 DIAGNOSIS — L84 Corns and callosities: Secondary | ICD-10-CM | POA: Diagnosis not present

## 2014-06-10 DIAGNOSIS — B351 Tinea unguium: Secondary | ICD-10-CM | POA: Diagnosis not present

## 2014-06-10 DIAGNOSIS — M79673 Pain in unspecified foot: Secondary | ICD-10-CM | POA: Diagnosis not present

## 2014-06-10 DIAGNOSIS — M2560 Stiffness of unspecified joint, not elsewhere classified: Secondary | ICD-10-CM | POA: Diagnosis not present

## 2014-06-10 NOTE — Progress Notes (Signed)
She presents today with chief complaint of painful nails bilateral.  Objective: Pulses are palpable bilateral. Nails are thick yellow dystrophic, mycotic and painful palpation.  Assessment: Pain in limb secondary to onychomycosis 1 through 5 bilateral.  Plan: Debridement of nails 1 through 5 bilateral covered service secondary to pain.

## 2014-06-14 ENCOUNTER — Telehealth: Payer: Self-pay | Admitting: Podiatry

## 2014-06-14 NOTE — Telephone Encounter (Signed)
Does she need an appointment with you to discuss this or will you just dictate a letter for her? Just let me know what I need to tell her!!

## 2014-06-14 NOTE — Telephone Encounter (Signed)
I'll talk with her at her next appointment.  No need to make a nonmedical appointment to discuss this.

## 2014-06-14 NOTE — Telephone Encounter (Signed)
PT WOULD LIKE TO TALK WITH DR HYATT ABOUT A REFERENCE LETTER IN [facility]- PT STATES THAT DR HYATT WILL KNOW WHAT SHE IS TALKING ABOUT

## 2014-06-15 ENCOUNTER — Encounter: Payer: Self-pay | Admitting: Internal Medicine

## 2014-06-15 ENCOUNTER — Ambulatory Visit (INDEPENDENT_AMBULATORY_CARE_PROVIDER_SITE_OTHER): Payer: Medicare Other | Admitting: Internal Medicine

## 2014-06-15 VITALS — BP 154/82 | HR 80 | Temp 97.7°F | Resp 16 | Ht 61.0 in | Wt 99.6 lb

## 2014-06-15 DIAGNOSIS — J449 Chronic obstructive pulmonary disease, unspecified: Secondary | ICD-10-CM | POA: Diagnosis not present

## 2014-06-15 DIAGNOSIS — M6281 Muscle weakness (generalized): Secondary | ICD-10-CM | POA: Diagnosis not present

## 2014-06-15 DIAGNOSIS — I10 Essential (primary) hypertension: Secondary | ICD-10-CM | POA: Diagnosis not present

## 2014-06-15 DIAGNOSIS — M2560 Stiffness of unspecified joint, not elsewhere classified: Secondary | ICD-10-CM | POA: Diagnosis not present

## 2014-06-15 DIAGNOSIS — M545 Low back pain: Secondary | ICD-10-CM | POA: Diagnosis not present

## 2014-06-15 NOTE — Telephone Encounter (Signed)
Called patient - L/M - let her know Dr Milinda Pointer will discuss this at her next appointment.

## 2014-06-15 NOTE — Progress Notes (Signed)
Subjective:    Patient ID: [name], female    DOB: [birth year], 69 y.o.   MRN: [id number]  HPI Patient presents for f/u to recheck BP after recently stopping her HCTZ which was contributory to her severe hyponatremia with resolution upon discontinuation . Pt reports improved sense of well-being off of the diuretic. BP is sl elevated today, but acceptable given her age. Denies any c/o HA, dizziness, CP, palpitations , pnd/orthopnea or edema, but does have her chronic DOE due to her COPD.   Medication Sig  . albuterol (PROVENTIL HFA;VENTOLIN HFA) 108 (90 BASE) MCG/ACT inhaler Inhale 2 puffs into the lungs every 4 (four) hours as needed for wheezing or shortness of breath (((PLAN B))).  Marland Kitchen aspirin EC 81 MG tablet Take 81 mg by mouth daily.   Marland Kitchen b complex vitamins tablet Take 1 tablet by mouth daily.  . cholecalciferol (VITAMIN D) 1000 UNITS tablet Take 5,000 Units by mouth daily.  . cyclobenzaprine (FLEXERIL) 10 MG tablet Take 10 mg by mouth at bedtime.  . gabapentin (NEURONTIN) 300 MG capsule Take 1 capsule (300 mg total) by mouth daily.  . Iron-Vitamin C (VITRON-C PO) Take 1 tablet by mouth. Takes every other day  . loperamide (IMODIUM A-D) 2 MG tablet Take 2 mg by mouth as needed for diarrhea or loose stools. Per box as needed for diarrhea  . LORazepam (ATIVAN) 1 MG tablet Take 1 tablet QDaily as needed for anxiety or hot flashes  . Magnesium 500 MG TABS Take 500 mg by mouth daily.   . meloxicam (MOBIC) 15 MG tablet Take 15 mg by mouth daily. For 10 days  . mirtazapine (REMERON) 45 MG tablet TAKE 1 TABLET BY MOUTH EVERY NIGHT AT BEDTIME  . mometasone-formoterol (DULERA) 100-5 MCG/ACT AERO Inhale 2 puffs into the lungs 2 (two) times daily.  . montelukast (SINGULAIR) 10 MG tablet TAKE 1 TABLET BY MOUTH ONCE A DAY  . omeprazole (PRILOSEC) 40 MG capsule Take 1 capsule (40 mg total) by mouth daily.  Marland Kitchen oxybutynin (DITROPAN) 5 MG tablet TAKE 1/2 TO 1 TABLET BY MOUTH THREE TIMES DAILY FOR BLADDER  CONTROL  . polyethylene glycol (MIRALAX / GLYCOLAX) packet Take 17 g by mouth daily.  . QUEtiapine (SEROQUEL) 400 MG tablet Take 1 tablet (400 mg total) by mouth at bedtime.  . quinapril (ACCUPRIL) 40 MG tablet TAKE 1 TABLET BY MOUTH EVERY NIGHT AT BEDTIME  . tiotropium (SPIRIVA) 18 MCG inhalation capsule Place 18 mcg into inhaler and inhale daily.  . traMADol (ULTRAM) 50 MG tablet TAKE 1 TABLET BY MOUTH TWICE DAILY AS NEEDED FOR PAIN  . TUDORZA PRESSAIR 400 MCG/ACT AEPB Take 1 puff by mouth daily.   . vitamin C (ASCORBIC ACID) 500 MG tablet Take 500 mg by mouth daily.   Allergies  Allergen Reactions  . Amoxapine And Related Nausea And Vomiting  . Amoxicillin     itching  . Ciprocinonide [Fluocinolone]     Pt unsure  . Ciprofloxacin     Pt unsure  . Doxycycline Nausea And Vomiting  . Erythromycin Hives  . Sulfa Antibiotics Other (See Comments)    Unknown-reaction as a child  . Zofran [Ondansetron Hcl] Nausea And Vomiting   Past Medical History  Diagnosis Date  . Asthma   . Hypertension   . GERD (gastroesophageal reflux disease)   . Stroke     was on Plavix - not now  . Carotid artery aneurysm 03/04/2012  . DJD (degenerative joint disease)   .  COPD (chronic obstructive pulmonary disease)   . Depression   . Shortness of breath    Past Surgical History  Procedure Laterality Date  . Video bronchoscopy  04/24/2012    Procedure: VIDEO BRONCHOSCOPY WITHOUT FLUORO;  Surgeon: Rigoberto Noel, MD;  Location: [facility];  Service: Cardiopulmonary;  Laterality: Bilateral;  . Tubal ligation  30 years  . Tonsillectomy    . Colonoscopy  2004  . Hernia repair Right 2014    inguinal  . Cataract extraction Bilateral   . Microlaryngoscopy with co2 laser and excision of vocal cord lesion N/A 06/05/2013    Procedure: MICROLARYNGOSCOPY WITH CO2 LASER AND EXCISION OF VOCAL CORD POLYPS;  Surgeon: Melida Quitter, MD;  Location: [facility];  Service: ENT;  Laterality: N/A;   Review of Systems 10 point  systems review negative except as above.    Objective:   Physical Exam   BP 154/82 mmHg  Pulse 80  Temp(Src) 97.7 F (36.5 C)  Resp 16  Ht 5\' 1"  (1.549 m)  Wt 99 lb 9.6 oz (45.178 kg)  BMI 18.83 kg/m2  HEENT - Eac's patent. TM's Nl. EOM's full. PERRLA. NasoOroPharynx clear. Neck - supple. Nl Thyroid. Carotids 2+ & No bruits, nodes, JVD Chest - Clear equal BS w/o Rales, rhonchi, wheezes. Cor - Nl HS. RRR w/o sig MGR. PP 1(+). No edema. Abd - No palpable organomegaly, masses or tenderness. BS nl. MS- FROM w/o deformities. Muscle power, tone and bulk Nl. Gait Nl. Neuro - No obvious Cr N abnormalities. Sensory, motor and Cerebellar functions appear Nl w/o focal abnormalities. Psyche - Mental status normal & appropriate.  No delusions, ideations or obvious mood abnormalities.    Assessment & Plan:   1. Essential hypertension - continue meds same and freq BP monitoring.  2. COPD

## 2014-06-17 ENCOUNTER — Telehealth: Payer: Self-pay | Admitting: *Deleted

## 2014-06-17 DIAGNOSIS — M2560 Stiffness of unspecified joint, not elsewhere classified: Secondary | ICD-10-CM | POA: Diagnosis not present

## 2014-06-17 DIAGNOSIS — M545 Low back pain: Secondary | ICD-10-CM | POA: Diagnosis not present

## 2014-06-17 DIAGNOSIS — M6281 Muscle weakness (generalized): Secondary | ICD-10-CM | POA: Diagnosis not present

## 2014-06-17 NOTE — Telephone Encounter (Signed)
Patient called and states she is having trouble sleeping.  Per Dr Melford Aase, try Tylenol PM 1-2 tabs at bedtime.  Left message to inform patient.

## 2014-06-22 ENCOUNTER — Other Ambulatory Visit: Payer: Self-pay | Admitting: Internal Medicine

## 2014-06-22 DIAGNOSIS — M2560 Stiffness of unspecified joint, not elsewhere classified: Secondary | ICD-10-CM | POA: Diagnosis not present

## 2014-06-22 DIAGNOSIS — M6281 Muscle weakness (generalized): Secondary | ICD-10-CM | POA: Diagnosis not present

## 2014-06-22 DIAGNOSIS — M545 Low back pain: Secondary | ICD-10-CM | POA: Diagnosis not present

## 2014-06-24 ENCOUNTER — Telehealth: Payer: Self-pay | Admitting: *Deleted

## 2014-06-24 DIAGNOSIS — M6281 Muscle weakness (generalized): Secondary | ICD-10-CM | POA: Diagnosis not present

## 2014-06-24 DIAGNOSIS — M2560 Stiffness of unspecified joint, not elsewhere classified: Secondary | ICD-10-CM | POA: Diagnosis not present

## 2014-06-24 DIAGNOSIS — M545 Low back pain: Secondary | ICD-10-CM | POA: Diagnosis not present

## 2014-06-24 NOTE — Telephone Encounter (Signed)
Patient called and requested a refill on Tramadol and says she take the med 3 times a day.  OK to send in RX for tid per Dr Melford Aase and he advised patient will need to go to a pain management doctor for her chronic pain.  Also, patient states she is having leg cramps at night.  Per Dr Melford Aase, add Benadryl 25 mg 1-2 at bedtime for cramps.

## 2014-06-29 ENCOUNTER — Other Ambulatory Visit: Payer: Self-pay | Admitting: Internal Medicine

## 2014-07-01 ENCOUNTER — Ambulatory Visit: Payer: Medicare Other | Admitting: Podiatry

## 2014-07-05 ENCOUNTER — Ambulatory Visit: Payer: Medicare Other | Admitting: Internal Medicine

## 2014-07-07 ENCOUNTER — Encounter: Payer: Self-pay | Admitting: Internal Medicine

## 2014-07-07 ENCOUNTER — Ambulatory Visit (INDEPENDENT_AMBULATORY_CARE_PROVIDER_SITE_OTHER): Payer: Medicare Other | Admitting: Internal Medicine

## 2014-07-07 ENCOUNTER — Other Ambulatory Visit: Payer: Self-pay | Admitting: Internal Medicine

## 2014-07-07 VITALS — BP 126/76 | HR 88 | Temp 97.9°F | Resp 16 | Ht 61.0 in | Wt 97.8 lb

## 2014-07-07 DIAGNOSIS — R252 Cramp and spasm: Secondary | ICD-10-CM | POA: Diagnosis not present

## 2014-07-07 DIAGNOSIS — Z79899 Other long term (current) drug therapy: Secondary | ICD-10-CM

## 2014-07-07 DIAGNOSIS — I10 Essential (primary) hypertension: Secondary | ICD-10-CM

## 2014-07-07 MED ORDER — GABAPENTIN 300 MG PO CAPS: ORAL_CAPSULE | ORAL | Status: DC

## 2014-07-07 NOTE — Patient Instructions (Signed)
  May take Gabapentin  (Neurontin ) 300 mg up to # 3 pills / day for leg cramps    Muscle Cramps and Spasms Muscle cramps and spasms occur when a muscle or muscles tighten and you have no control over this tightening (involuntary muscle contraction). They are a common problem and can develop in any muscle. The most common place is in the calf muscles of the leg. Both muscle cramps and muscle spasms are involuntary muscle contractions, but they also have differences:   Muscle cramps are sporadic and painful. They may last a few seconds to a quarter of an hour. Muscle cramps are often more forceful and last longer than muscle spasms.  Muscle spasms may or may not be painful. They may also last just a few seconds or much longer. CAUSES  It is uncommon for cramps or spasms to be due to a serious underlying problem. In many cases, the cause of cramps or spasms is unknown. Some common causes are:   Overexertion.   Overuse from repetitive motions (doing the same thing over and over).   Remaining in a certain position for a long period of time.   Improper preparation, form, or technique while performing a sport or activity.   Dehydration.   Injury.   Side effects of some medicines.   Abnormally low levels of the salts and ions in your blood (electrolytes), especially potassium and calcium. This could happen if you are taking water pills (diuretics) or you are pregnant.  Some underlying medical problems can make it more likely to develop cramps or spasms. These include, but are not limited to:   Diabetes.   Parkinson disease.   Hormone disorders, such as thyroid problems.   Alcohol abuse.   Diseases specific to muscles, joints, and bones.   Blood vessel disease where not enough blood is getting to the muscles.  HOME CARE INSTRUCTIONS   Stay well hydrated. Drink enough water and fluids to keep your urine clear or pale yellow.  It may be helpful to massage, stretch,  and relax the affected muscle.  For tight or tense muscles, use a warm towel, heating pad, or hot shower water directed to the affected area.  If you are sore or have pain after a cramp or spasm, applying ice to the affected area may relieve discomfort.  Put ice in a plastic bag.  Place a towel between your skin and the bag.  Leave the ice on for 15-20 minutes, 03-04 times a day.  Medicines used to treat a known cause of cramps or spasms may help reduce their frequency or severity. Only take over-the-counter or prescription medicines as directed by your caregiver. SEEK MEDICAL CARE IF:  Your cramps or spasms get more severe, more frequent, or do not improve over time.  MAKE SURE YOU:   Understand these instructions.  Will watch your condition.  Will get help right away if you are not doing well or get worse. Document Released: 09/01/2001 Document Revised: 07/07/2012 Document Reviewed: 02/27/2012 [facility] Patient Information 2015 [place], [place]. This information is not intended to replace advice given to you by your health care provider. Make sure you discuss any questions you have with your health care provider.

## 2014-07-07 NOTE — Progress Notes (Signed)
Subjective:    Patient ID: [name], female    DOB: 02/22/1946, 69 y.o.   MRN: [id number]  HPI  Patient presents with c/o difficulty sleeping due to leg cramps which either keep her awake or awaken her from sleep & apparently she's tried concomitant OTC agents with Diphenhydramine taking up to 100-150 mg /night. No claudication sx's. She does have hx/o tendency to overuse sophoretic medications in the past.   Outpatient Prescriptions Prior to Visit  Medication Sig Dispense Refill  . albuterol (PROVENTIL HFA;VENTOLIN HFA) 108 (90 BASE) MCG/ACT inhaler Inhale 2 puffs into the lungs every 4 (four) hours as needed for wheezing or shortness of breath (((PLAN B))).    Marland Kitchen aspirin EC 81 MG tablet Take 81 mg by mouth daily.     Marland Kitchen b complex vitamins tablet Take 1 tablet by mouth daily.    . cholecalciferol (VITAMIN D) 1000 UNITS tablet Take 5,000 Units by mouth daily.    . cyclobenzaprine (FLEXERIL) 10 MG tablet Take 10 mg by mouth at bedtime.    . Iron-Vitamin C (VITRON-C PO) Take 1 tablet by mouth. Takes every other day    . loperamide (IMODIUM A-D) 2 MG tablet Take 2 mg by mouth as needed for diarrhea or loose stools. Per box as needed for diarrhea    . LORazepam (ATIVAN) 1 MG tablet TAKE 1/2 TO 1 TABLET BY MOUTH THREE TIMES DAILY AS NEEDED FOR ANXIETY OR HOT FLASHES 90 tablet 2  . Magnesium 500 MG TABS Take 500 mg by mouth daily.     . meloxicam (MOBIC) 15 MG tablet Take 15 mg by mouth daily. For 10 days    . mirtazapine (REMERON) 45 MG tablet TAKE 1 TABLET BY MOUTH EVERY NIGHT AT BEDTIME 90 tablet 1  . mometasone-formoterol (DULERA) 100-5 MCG/ACT AERO Inhale 2 puffs into the lungs 2 (two) times daily. 1 Inhaler 3  . montelukast (SINGULAIR) 10 MG tablet TAKE 1 TABLET BY MOUTH ONCE A DAY 90 tablet 3  . omeprazole (PRILOSEC) 40 MG capsule Take 1 capsule (40 mg total) by mouth daily. 30 capsule 3  . oxybutynin (DITROPAN) 5 MG tablet TAKE 1/2 TO 1 TABLET BY MOUTH THREE TIMES DAILY FOR BLADDER  CONTROL 90 tablet 1  . polyethylene glycol (MIRALAX / GLYCOLAX) packet Take 17 g by mouth daily. 14 each 0  . QUEtiapine (SEROQUEL) 400 MG tablet Take 1 tablet (400 mg total) by mouth at bedtime. 30 tablet 5  . quinapril (ACCUPRIL) 40 MG tablet TAKE 1 TABLET BY MOUTH EVERY NIGHT AT BEDTIME 90 tablet 1  . tiotropium (SPIRIVA) 18 MCG inhalation capsule Place 18 mcg into inhaler and inhale daily.    . traMADol (ULTRAM) 50 MG tablet Take 1 tablet (50 mg total) by mouth 3 (three) times daily as needed. 90 tablet 0  . TUDORZA PRESSAIR 400 MCG/ACT AEPB Take 1 puff by mouth daily.     . vitamin C (ASCORBIC ACID) 500 MG tablet Take 500 mg by mouth daily.    Marland Kitchen gabapentin (NEURONTIN) 300 MG capsule Take 1 capsule (300 mg total) by mouth daily. 30 capsule 0   No facility-administered medications prior to visit.   Allergies  Allergen Reactions  . Amoxapine And Related Nausea And Vomiting  . Amoxicillin     itching  . Ciprocinonide [Fluocinolone]     Pt unsure  . Ciprofloxacin     Pt unsure  . Doxycycline Nausea And Vomiting  . Erythromycin Hives  . Sulfa Antibiotics  Other (See Comments)    Unknown-reaction as a child  . Zofran [Ondansetron Hcl] Nausea And Vomiting   Past Medical History  Diagnosis Date  . Asthma   . Hypertension   . GERD (gastroesophageal reflux disease)   . Stroke     was on Plavix - not now  . Carotid artery aneurysm 03/04/2012  . DJD (degenerative joint disease)   . COPD (chronic obstructive pulmonary disease)   . Depression   . Shortness of breath    Past Surgical History  Procedure Laterality Date  . Video bronchoscopy  04/24/2012    Procedure: VIDEO BRONCHOSCOPY WITHOUT FLUORO;  Surgeon: Rigoberto Noel, MD;  Location: [facility];  Service: Cardiopulmonary;  Laterality: Bilateral;  . Tubal ligation  30 years  . Tonsillectomy    . Colonoscopy  2004  . Hernia repair Right 2014    inguinal  . Cataract extraction Bilateral   . Microlaryngoscopy with co2 laser  and excision of vocal cord lesion N/A 06/05/2013    Procedure: MICROLARYNGOSCOPY WITH CO2 LASER AND EXCISION OF VOCAL CORD POLYPS;  Surgeon: Melida Quitter, MD;  Location: [facility];  Service: ENT;  Laterality: N/A;   Review of Systems In addition to the HPI above,  No Fever-chills,  No Headache, No changes with Vision or hearing,  No problems swallowing food or Liquids,  No Chest pain or productive Cough or Shortness of Breath,  No Abdominal pain, No Nausea or Vomitting, Bowel movements are regular,  No Blood in stool or Urine,  No dysuria,  No new skin rashes or bruises,  No new joints pains-aches,  No new weakness, tingling, numbness in any extremity,  No recent weight loss,  No polyuria, polydypsia or polyphagia,  No significant Mental Stressors.  A full 10 point Review of Systems was done, except as stated above, all other Review of Systems were negative    Objective:   Physical Exam  BP 126/76 mmHg  Pulse 88  Temp(Src) 97.9 F (36.6 C)  Resp 16  Ht 5\' 1"  (1.549 m)  Wt 97 lb 12.8 oz (44.362 kg)  BMI 18.49 kg/m2   In no distress. Hoarse raspy voice.   HEENT - Eac's patent. TM's Nl. EOM's full. PERRLA. NasoOroPharynx clear. Neck - supple. Nl Thyroid. Carotids 2+ & No bruits, nodes, JVD Chest - Clear equal BS w/o Rales, rhonchi, wheezes. Cor - Nl HS. RRR w/o sig MGR. PP 1(+). No edema. Abd - No palpable organomegaly, masses or tenderness. BS nl. MS- FROM w/o deformities. Muscle power, tone and bulk Nl. Gait Nl. Neuro - No obvious Cr N abnormalities. Sensory, motor and Cerebellar functions appear Nl w/o focal abnormalities. Psyche - Mental status normal & appropriate.  No delusions, ideations or obvious mood abnormalities.    Assessment & Plan:   1. Essential hypertension   2. Cramps, muscle, general  - BASIC METABOLIC PANEL WITH GFR - Magnesium  - gabapentin (NEURONTIN) 300 MG capsule; Take 1 capsule 3 x day as needed for muscle cramps  Dispense: 30 capsule; Refill:  5  3. Medication management  - BASIC METABOLIC PANEL WITH GFR - Magnesium  - strongly re-iterated the importance of only taking Rx and OTC med as prescribed or recc per package instruction at risk of peril.

## 2014-07-08 LAB — BASIC METABOLIC PANEL WITH GFR
BUN: 14 mg/dL (ref 6–23)
CALCIUM: 9.3 mg/dL (ref 8.4–10.5)
CHLORIDE: 99 meq/L (ref 96–112)
CO2: 28 mEq/L (ref 19–32)
Creat: 1.01 mg/dL (ref 0.50–1.10)
GFR, Est African American: 65 mL/min
GFR, Est Non African American: 57 mL/min — ABNORMAL LOW
GLUCOSE: 78 mg/dL (ref 70–99)
Potassium: 4.7 mEq/L (ref 3.5–5.3)
SODIUM: 134 meq/L — AB (ref 135–145)

## 2014-07-08 LAB — MAGNESIUM: Magnesium: 2 mg/dL (ref 1.5–2.5)

## 2014-07-15 ENCOUNTER — Encounter: Payer: Self-pay | Admitting: Podiatry

## 2014-07-15 ENCOUNTER — Ambulatory Visit (INDEPENDENT_AMBULATORY_CARE_PROVIDER_SITE_OTHER): Payer: Medicare Other | Admitting: Podiatry

## 2014-07-15 DIAGNOSIS — M79673 Pain in unspecified foot: Secondary | ICD-10-CM

## 2014-07-15 DIAGNOSIS — B351 Tinea unguium: Secondary | ICD-10-CM

## 2014-07-15 DIAGNOSIS — L84 Corns and callosities: Secondary | ICD-10-CM

## 2014-07-15 NOTE — Progress Notes (Signed)
She presents today with chief complaint of painful nails bilateral.  Objective: Pulses are palpable bilateral. Nails are thick yellow dystrophic, mycotic and painful palpation.  Assessment: Pain in limb secondary to onychomycosis 1 through 5 bilateral.  Plan: Debridement of nails 1 through 5 bilateral covered service secondary to pain.

## 2014-07-16 NOTE — Op Note (Signed)
PATIENT NAME:  [name], [name] MR#:  [id number] DATE OF BIRTH:  [birth year]  DATE OF PROCEDURE:  01/08/2013  PREOPERATIVE DIAGNOSIS:  Right inguinal hernia.   POSTOPERATIVE DIAGNOSIS:  Right inguinal hernia.   OPERATION:  Repair of right inguinal hernia.   ANESTHESIA:  Monitored care and local anesthetic of 0.5% Marcaine with 1% Xylocaine.   COMPLICATIONS:  None.   ESTIMATED BLOOD LOSS:  Minimal.   DRAINS:  None.   DESCRIPTION OF PROCEDURE:  The patient was placed in the supine position on the operating table.  With adequate sedation and monitoring, the right groin area was prepped and draped out as a sterile field. Timeout procedure was performed. A local anesthetic was instilled along the medial two thirds of the inguinal canal, and an incision was made along this area. It was then deepened through the layers down to the external oblique and bleeding controlled with cautery. The inguinal canal was infiltrated with local anesthetic, and the external oblique was opened along the line of its fibers. Exploration revealed that the round ligament was intact. There was a separate hernia protrusion which was located medial to the round ligament with enlargement of the internal ring area. This was approximately a 5 cm long x 2 cm wide protrusion, which was predominantly preperitoneal fat. This was freed from the round ligament and easily pushed back in, and following this, the internal ring was snugged up with 2 stitches of 2-0 PDS. Following this, the posterior wall was satisfactorily exposed, and a Parietex ProGrip mesh was placed around the round ligament of the internal area and the mesh was snugged down to the posterior wall. It was trimmed to approximate size. The lateral ends were tucked underneath the external oblique, and the medial end was tacked to the pubic tubercle with 2 stitches of 2-0 PDS. The wound was irrigated and closed. External oblique was closed with a running 2-0 PDS, subcutaneous  tissue with 3-0 Vicryl. The skin was closed with subcuticular 4-0 Vicryl, covered with Dermabond. The procedure was well tolerated. She was subsequently returned to the recovery room in stable condition.    ____________________________ S.Robinette Haines, MD sgs:dmm D: 01/08/2013 15:48:59 ET T: 01/08/2013 21:36:42 ET JOB#: [id number]  cc: Synthia Innocent. Jamal Collin, MD, <Dictator> [facility] Robinette Haines MD ELECTRONICALLY SIGNED 01/09/2013 10:21

## 2014-08-05 ENCOUNTER — Ambulatory Visit: Payer: Self-pay | Admitting: Internal Medicine

## 2014-08-05 ENCOUNTER — Ambulatory Visit (INDEPENDENT_AMBULATORY_CARE_PROVIDER_SITE_OTHER): Payer: Medicare Other | Admitting: Internal Medicine

## 2014-08-05 ENCOUNTER — Encounter: Payer: Self-pay | Admitting: Internal Medicine

## 2014-08-05 VITALS — BP 118/64 | HR 86 | Temp 98.2°F | Resp 18 | Ht 61.0 in | Wt 101.0 lb

## 2014-08-05 DIAGNOSIS — Z79899 Other long term (current) drug therapy: Secondary | ICD-10-CM

## 2014-08-05 DIAGNOSIS — J449 Chronic obstructive pulmonary disease, unspecified: Secondary | ICD-10-CM | POA: Diagnosis not present

## 2014-08-05 DIAGNOSIS — R7303 Prediabetes: Secondary | ICD-10-CM

## 2014-08-05 DIAGNOSIS — E782 Mixed hyperlipidemia: Secondary | ICD-10-CM

## 2014-08-05 DIAGNOSIS — I10 Essential (primary) hypertension: Secondary | ICD-10-CM

## 2014-08-05 DIAGNOSIS — E559 Vitamin D deficiency, unspecified: Secondary | ICD-10-CM

## 2014-08-05 DIAGNOSIS — R7309 Other abnormal glucose: Secondary | ICD-10-CM

## 2014-08-05 LAB — CBC WITH DIFFERENTIAL/PLATELET
BASOS ABS: 0.1 10*3/uL (ref 0.0–0.1)
Basophils Relative: 1 % (ref 0–1)
EOS ABS: 0.6 10*3/uL (ref 0.0–0.7)
EOS PCT: 8 % — AB (ref 0–5)
HCT: 35.4 % — ABNORMAL LOW (ref 36.0–46.0)
Hemoglobin: 11.9 g/dL — ABNORMAL LOW (ref 12.0–15.0)
Lymphocytes Relative: 36 % (ref 12–46)
Lymphs Abs: 2.6 10*3/uL (ref 0.7–4.0)
MCH: 31.2 pg (ref 26.0–34.0)
MCHC: 33.6 g/dL (ref 30.0–36.0)
MCV: 92.7 fL (ref 78.0–100.0)
MONO ABS: 1 10*3/uL (ref 0.1–1.0)
MPV: 8.7 fL (ref 8.6–12.4)
Monocytes Relative: 14 % — ABNORMAL HIGH (ref 3–12)
Neutro Abs: 3 10*3/uL (ref 1.7–7.7)
Neutrophils Relative %: 41 % — ABNORMAL LOW (ref 43–77)
PLATELETS: 350 10*3/uL (ref 150–400)
RBC: 3.82 MIL/uL — ABNORMAL LOW (ref 3.87–5.11)
RDW: 15.5 % (ref 11.5–15.5)
WBC: 7.2 10*3/uL (ref 4.0–10.5)

## 2014-08-05 NOTE — Patient Instructions (Signed)
Dehydration, Adult Dehydration is when you lose more fluids from the body than you take in. Vital organs like the kidneys, brain, and heart cannot function without a proper amount of fluids and salt. Any loss of fluids from the body can cause dehydration.  CAUSES   Vomiting.  Diarrhea.  Excessive sweating.  Excessive urine output.  Fever. SYMPTOMS  Mild dehydration  Thirst.  Dry lips.  Slightly dry mouth. Moderate dehydration  Very dry mouth.  Sunken eyes.  Skin does not bounce back quickly when lightly pinched and released.  Dark urine and decreased urine production.  Decreased tear production.  Headache. Severe dehydration  Very dry mouth.  Extreme thirst.  Rapid, weak pulse (more than 100 beats per minute at rest).  Cold hands and feet.  Not able to sweat in spite of heat and temperature.  Rapid breathing.  Blue lips.  Confusion and lethargy.  Difficulty being awakened.  Minimal urine production.  No tears. DIAGNOSIS  Your caregiver will diagnose dehydration based on your symptoms and your exam. Blood and urine tests will help confirm the diagnosis. The diagnostic evaluation should also identify the cause of dehydration. TREATMENT  Treatment of mild or moderate dehydration can often be done at home by increasing the amount of fluids that you drink. It is best to drink small amounts of fluid more often. Drinking too much at one time can make vomiting worse. Refer to the home care instructions below. Severe dehydration needs to be treated at the hospital where you will probably be given intravenous (IV) fluids that contain water and electrolytes. HOME CARE INSTRUCTIONS   Ask your caregiver about specific rehydration instructions.  Drink enough fluids to keep your urine clear or pale yellow.  Drink small amounts frequently if you have nausea and vomiting.  Eat as you normally do.  Avoid:  Foods or drinks high in sugar.  Carbonated  drinks.  Juice.  Extremely hot or cold fluids.  Drinks with caffeine.  Fatty, greasy foods.  Alcohol.  Tobacco.  Overeating.  Gelatin desserts.  Wash your hands well to avoid spreading bacteria and viruses.  Only take over-the-counter or prescription medicines for pain, discomfort, or fever as directed by your caregiver.  Ask your caregiver if you should continue all prescribed and over-the-counter medicines.  Keep all follow-up appointments with your caregiver. SEEK MEDICAL CARE IF:  You have abdominal pain and it increases or stays in one area (localizes).  You have a rash, stiff neck, or severe headache.  You are irritable, sleepy, or difficult to awaken.  You are weak, dizzy, or extremely thirsty. SEEK IMMEDIATE MEDICAL CARE IF:   You are unable to keep fluids down or you get worse despite treatment.  You have frequent episodes of vomiting or diarrhea.  You have blood or green matter (bile) in your vomit.  You have blood in your stool or your stool looks black and tarry.  You have not urinated in 6 to 8 hours, or you have only urinated a small amount of very dark urine.  You have a fever.  You faint. MAKE SURE YOU:   Understand these instructions.  Will watch your condition.  Will get help right away if you are not doing well or get worse. Document Released: 03/12/2005 Document Revised: 06/04/2011 Document Reviewed: 10/30/2010 [employer] Patient Information 2015 [employer]. This information is not intended to replace advice given to you by your health care provider. Make sure you discuss any questions you have with your health care   provider.  

## 2014-08-05 NOTE — Progress Notes (Signed)
Patient ID: RAYLEA ADCOX, female   DOB: 03-16-1946, 69 y.o.   MRN: [id number]  Assessment and Plan:  Hypertension:  -Continue medication,  -monitor blood pressure at home.  -Continue DASH diet.   -Reminder to go to the ER if any CP, SOB, nausea, dizziness, severe HA, changes vision/speech, left arm numbness and tingling, and jaw pain.  Cholesterol: -Continue diet and exercise.  -Check cholesterol.   Pre-diabetes: -Continue diet and exercise.  -Check A1C  Vitamin D Def: -check level -continue medications.   COPD -cont current meds -recommended quitting smoking   Continue diet and meds as discussed. Further disposition pending results of labs.  HPI 69 y.o. female  presents for 3 month follow up with hypertension, hyperlipidemia, prediabetes and vitamin D.   Her blood pressure has been controlled at home, today their BP is BP: 118/64 mmHg.   She does workout.  She reports that she is doing yard work.   She denies chest pain, shortness of breath, dizziness.  She reports that she is still smoking and is smoking a pack every 3 days.     She is on cholesterol medication and denies myalgias. Her cholesterol is at goal. The cholesterol last visit was:   Lab Results  Component Value Date   CHOL 125 04/29/2014   HDL 63 04/29/2014   LDLCALC 37 04/29/2014   TRIG 124 04/29/2014   CHOLHDL 2.0 04/29/2014     She has been working on diet and exercise for prediabetes, and denies foot ulcerations, hyperglycemia, hypoglycemia , increased appetite, nausea, paresthesia of the feet, polydipsia, polyuria, visual disturbances, vomiting and weight loss. Last A1C in the office was:  Lab Results  Component Value Date   HGBA1C 6.0* 04/29/2014    Patient is on Vitamin D supplement.  Lab Results  Component Value Date   VD25OH 12 04/29/2014     She is eating a lot more veggies and she is eating fried pork chops and fried chicken.    Breathing is doing a lot better.    Current Medications:   Current Outpatient Prescriptions on File Prior to Visit  Medication Sig Dispense Refill  . albuterol (PROVENTIL HFA;VENTOLIN HFA) 108 (90 BASE) MCG/ACT inhaler Inhale 2 puffs into the lungs every 4 (four) hours as needed for wheezing or shortness of breath (((PLAN B))).    Marland Kitchen aspirin EC 81 MG tablet Take 81 mg by mouth daily.     Marland Kitchen b complex vitamins tablet Take 1 tablet by mouth daily.    . cholecalciferol (VITAMIN D) 1000 UNITS tablet Take 5,000 Units by mouth daily.    . cyclobenzaprine (FLEXERIL) 10 MG tablet Take 10 mg by mouth at bedtime.    . gabapentin (NEURONTIN) 300 MG capsule Take 1 capsule 3 x day as needed for muscle cramps 30 capsule 5  . Iron-Vitamin C (VITRON-C PO) Take 1 tablet by mouth. Takes every other day    . loperamide (IMODIUM A-D) 2 MG tablet Take 2 mg by mouth as needed for diarrhea or loose stools. Per box as needed for diarrhea    . LORazepam (ATIVAN) 1 MG tablet TAKE 1/2 TO 1 TABLET BY MOUTH THREE TIMES DAILY AS NEEDED FOR ANXIETY OR HOT FLASHES 90 tablet 2  . Magnesium 500 MG TABS Take 500 mg by mouth daily.     . meloxicam (MOBIC) 15 MG tablet Take 15 mg by mouth daily. For 10 days    . mirtazapine (REMERON) 45 MG tablet TAKE 1 TABLET BY MOUTH  EVERY NIGHT AT BEDTIME 90 tablet 1  . mometasone-formoterol (DULERA) 100-5 MCG/ACT AERO Inhale 2 puffs into the lungs 2 (two) times daily. 1 Inhaler 3  . montelukast (SINGULAIR) 10 MG tablet TAKE 1 TABLET BY MOUTH ONCE A DAY 90 tablet 3  . omeprazole (PRILOSEC) 40 MG capsule Take 1 capsule (40 mg total) by mouth daily. 30 capsule 3  . OVER THE COUNTER MEDICATION     . oxybutynin (DITROPAN) 5 MG tablet TAKE 1/2 TO 1 TABLET BY MOUTH THREE TIMES DAILY FOR BLADDER CONTROL 90 tablet 1  . polyethylene glycol (MIRALAX / GLYCOLAX) packet Take 17 g by mouth daily. 14 each 0  . QUEtiapine (SEROQUEL) 400 MG tablet Take 1 tablet (400 mg total) by mouth at bedtime. 30 tablet 5  . quinapril (ACCUPRIL) 40 MG tablet TAKE 1 TABLET BY MOUTH  EVERY NIGHT AT BEDTIME 90 tablet 1  . tiotropium (SPIRIVA) 18 MCG inhalation capsule Place 18 mcg into inhaler and inhale daily.    . traMADol (ULTRAM) 50 MG tablet Take 1 tablet (50 mg total) by mouth 3 (three) times daily as needed. 90 tablet 0  . TUDORZA PRESSAIR 400 MCG/ACT AEPB Take 1 puff by mouth daily.     . vitamin C (ASCORBIC ACID) 500 MG tablet Take 500 mg by mouth daily.     No current facility-administered medications on file prior to visit.    Medical History:  Past Medical History  Diagnosis Date  . Asthma   . Hypertension   . GERD (gastroesophageal reflux disease)   . Stroke     was on Plavix - not now  . Carotid artery aneurysm 03/04/2012  . DJD (degenerative joint disease)   . COPD (chronic obstructive pulmonary disease)   . Depression   . Shortness of breath     Allergies:  Allergies  Allergen Reactions  . Amoxapine And Related Nausea And Vomiting  . Amoxicillin     itching  . Ciprocinonide [Fluocinolone]     Pt unsure  . Ciprofloxacin     Pt unsure  . Doxycycline Nausea And Vomiting  . Erythromycin Hives  . Sulfa Antibiotics Other (See Comments)    Unknown-reaction as a child  . Zofran [Ondansetron Hcl] Nausea And Vomiting     Review of Systems:  Review of Systems  Constitutional: Negative for fever, chills and malaise/fatigue.  HENT: Negative for congestion, ear discharge, ear pain and sore throat.   Respiratory: Negative for cough, shortness of breath and wheezing.   Cardiovascular: Negative for chest pain, palpitations and leg swelling.  Gastrointestinal: Negative for heartburn and abdominal pain.  Genitourinary: Negative for dysuria, frequency and flank pain.  Neurological: Negative for headaches.  Psychiatric/Behavioral: Negative for depression. The patient is not nervous/anxious and does not have insomnia.     Family history- Review and unchanged  Social history- Review and unchanged  Physical Exam: BP 118/64 mmHg  Pulse 86   Temp(Src) 98.2 F (36.8 C) (Temporal)  Resp 18  Ht 5\' 1"  (1.549 m)  Wt 101 lb (45.813 kg)  BMI 19.09 kg/m2  SpO2 94% Wt Readings from Last 3 Encounters:  08/05/14 101 lb (45.813 kg)  07/07/14 97 lb 12.8 oz (44.362 kg)  06/15/14 99 lb 9.6 oz (45.178 kg)    General Appearance: Thin and appears older than stated age well developed, in no apparent distress. Eyes: PERRLA, EOMs, conjunctiva no swelling or erythema ENT/Mouth: Ear canals normal without obstruction, swelling, erythma, discharge.  TMs normal bilaterally.  Oropharynx moist,  clear, without exudate, or postoropharyngeal swelling. Neck: Supple, thyroid normal,no cervical adenopathy  Respiratory: Respiratory effort normal, Breath sounds clear A&P without rhonchi, wheeze, or rale.  No retractions, no accessory usage. Cardio: RRR with no MRGs. Brisk peripheral pulses without edema.  Abdomen: Soft, + BS,  Non tender, no guarding, rebound, hernias, masses. Musculoskeletal: Full ROM, 5/5 strength, Normal gait Skin: Warm, dry without rashes, lesions, ecchymosis.  Neuro: Awake and oriented X 3, Cranial nerves intact. Normal muscle tone, no cerebellar symptoms. Psych: Normal affect, Insight and Judgment appropriate.    FORCUCCI, Lequita Meadowcroft, PA-C 2:42 PM [facility]

## 2014-08-06 LAB — HEPATIC FUNCTION PANEL
ALK PHOS: 64 U/L (ref 39–117)
ALT: 8 U/L (ref 0–35)
AST: 18 U/L (ref 0–37)
Albumin: 3.7 g/dL (ref 3.5–5.2)
BILIRUBIN DIRECT: 0.1 mg/dL (ref 0.0–0.3)
BILIRUBIN INDIRECT: 0.2 mg/dL (ref 0.2–1.2)
BILIRUBIN TOTAL: 0.3 mg/dL (ref 0.2–1.2)
Total Protein: 6 g/dL (ref 6.0–8.3)

## 2014-08-06 LAB — BASIC METABOLIC PANEL WITH GFR
BUN: 15 mg/dL (ref 6–23)
CO2: 23 meq/L (ref 19–32)
Calcium: 8.9 mg/dL (ref 8.4–10.5)
Chloride: 101 mEq/L (ref 96–112)
Creat: 1.14 mg/dL — ABNORMAL HIGH (ref 0.50–1.10)
GFR, EST AFRICAN AMERICAN: 56 mL/min — AB
GFR, EST NON AFRICAN AMERICAN: 49 mL/min — AB
Glucose, Bld: 104 mg/dL — ABNORMAL HIGH (ref 70–99)
POTASSIUM: 4.5 meq/L (ref 3.5–5.3)
Sodium: 133 mEq/L — ABNORMAL LOW (ref 135–145)

## 2014-08-06 LAB — LIPID PANEL
CHOLESTEROL: 120 mg/dL (ref 0–200)
HDL: 55 mg/dL (ref >=46)
LDL Cholesterol: 34 mg/dL (ref 0–99)
Total CHOL/HDL Ratio: 2.2 Ratio
Triglycerides: 155 mg/dL — ABNORMAL HIGH (ref <150)
VLDL: 31 mg/dL (ref 0–40)

## 2014-08-06 LAB — MAGNESIUM: Magnesium: 1.8 mg/dL (ref 1.5–2.5)

## 2014-08-06 LAB — HEMOGLOBIN A1C
Hgb A1c MFr Bld: 5.6 % (ref <5.7)
Mean Plasma Glucose: 114 mg/dL (ref <117)

## 2014-08-06 LAB — INSULIN, RANDOM: INSULIN: 14.7 u[IU]/mL (ref 2.0–19.6)

## 2014-08-09 LAB — VITAMIN D 1,25 DIHYDROXY
VITAMIN D3 1, 25 (OH): 26 pg/mL
Vitamin D 1, 25 (OH)2 Total: 26 pg/mL (ref 18–72)
Vitamin D2 1, 25 (OH)2: <8 pg/mL

## 2014-08-11 ENCOUNTER — Other Ambulatory Visit: Payer: Self-pay | Admitting: Internal Medicine

## 2014-08-11 MED ORDER — RANITIDINE HCL 300 MG PO TABS: ORAL_TABLET | ORAL | Status: DC

## 2014-08-12 ENCOUNTER — Ambulatory Visit (INDEPENDENT_AMBULATORY_CARE_PROVIDER_SITE_OTHER): Payer: Medicare Other | Admitting: Podiatry

## 2014-08-12 ENCOUNTER — Encounter: Payer: Self-pay | Admitting: Podiatry

## 2014-08-12 ENCOUNTER — Telehealth: Payer: Self-pay | Admitting: *Deleted

## 2014-08-12 DIAGNOSIS — B351 Tinea unguium: Secondary | ICD-10-CM | POA: Diagnosis not present

## 2014-08-12 DIAGNOSIS — L84 Corns and callosities: Secondary | ICD-10-CM | POA: Diagnosis not present

## 2014-08-12 DIAGNOSIS — M79673 Pain in unspecified foot: Secondary | ICD-10-CM | POA: Diagnosis not present

## 2014-08-12 NOTE — Progress Notes (Signed)
She presents today for a chief complaint of painful callus and painful toenails bilateral.  Objective: Nails are thick yellow dystrophic onychomycotic porokeratotic lesion medial aspect second toe right foot and lateral aspect fifth digit right foot.  Assessment: Porokeratosis and onychomycosis bilateral.  Plan: Debridement of nails and porokeratotic tissue today follow up with her as needed.

## 2014-08-12 NOTE — Telephone Encounter (Signed)
Patient called and was concerned about taking Omeprazole due to kidney damage.  OK to change to Ranitidine and RX sent to [facility] per Dr Melford Aase.  Letter sent to patient explaining how to wean off Omeprazole.

## 2014-08-18 ENCOUNTER — Ambulatory Visit: Payer: Self-pay | Admitting: Internal Medicine

## 2014-08-19 ENCOUNTER — Other Ambulatory Visit: Payer: Self-pay | Admitting: Internal Medicine

## 2014-08-22 ENCOUNTER — Other Ambulatory Visit: Payer: Self-pay | Admitting: Internal Medicine

## 2014-08-24 ENCOUNTER — Ambulatory Visit: Payer: Self-pay | Admitting: Internal Medicine

## 2014-08-30 ENCOUNTER — Encounter: Payer: Self-pay | Admitting: Internal Medicine

## 2014-08-30 ENCOUNTER — Other Ambulatory Visit: Payer: Self-pay | Admitting: Internal Medicine

## 2014-08-30 ENCOUNTER — Ambulatory Visit: Payer: Medicare Other | Admitting: Internal Medicine

## 2014-08-30 VITALS — BP 118/62 | HR 72 | Temp 97.8°F | Resp 16 | Ht 61.0 in | Wt 104.0 lb

## 2014-08-30 DIAGNOSIS — K219 Gastro-esophageal reflux disease without esophagitis: Secondary | ICD-10-CM

## 2014-08-30 NOTE — Progress Notes (Signed)
   Subjective:    Patient ID: ROBIE Bolton, female    DOB: 05/01/1945, 69 y.o.   MRN: [id number]  HPI Patient came in primarily to discuss the cost of Ranitidine (#180 caps/$21) to use in transitioning off of Omeprazole. Denies any current significant GI sx's.   Meds, All, PMHx reviewed   Review of Systems   10 point systems review negative except as above.     Objective:   Physical Exam  BP 118/62 mmHg  Pulse 72  Temp(Src) 97.8 F (36.6 C)  Resp 16  Ht 5\' 1"  (1.549 m)  Wt 104 lb (47.174 kg)  BMI 19.66 kg/m2  No formal Exam - Patient appears in good spirits - smiling & in NAD.    Assessment & Plan:   1. Gastroesophageal reflux disease  Discussed tapering off of Omeprazole to Ranitidine, then hopefully to prn use only.  No charge OV today

## 2014-09-02 ENCOUNTER — Ambulatory Visit: Payer: Medicare Other | Admitting: Podiatry

## 2014-09-09 ENCOUNTER — Encounter: Payer: Self-pay | Admitting: Podiatry

## 2014-09-09 ENCOUNTER — Ambulatory Visit (INDEPENDENT_AMBULATORY_CARE_PROVIDER_SITE_OTHER): Payer: Medicare Other | Admitting: Podiatry

## 2014-09-09 DIAGNOSIS — B351 Tinea unguium: Secondary | ICD-10-CM

## 2014-09-09 DIAGNOSIS — G5761 Lesion of plantar nerve, right lower limb: Secondary | ICD-10-CM

## 2014-09-09 DIAGNOSIS — M79676 Pain in unspecified toe(s): Secondary | ICD-10-CM | POA: Diagnosis not present

## 2014-09-09 NOTE — Progress Notes (Signed)
She presents today with a chief complaint of a painful neuroma third interdigital space of the right foot. She also complains of painfully elongated nails 1 through 5 bilateral.  Objective: Pulses are palpable right palpable neuroma to the third interdigital space right foot with a palpable Mulder's click. Nails are thick yellow dystrophic mycotic and painful palpation.  Assessment: Neuroma third interspace right. Pain in limb secondary to onychomycosis 1 through 5 bilateral.  Plan: Injected the right third interdigital space with dexamethasone. Debridement of nails 1 through 5 bilateral.

## 2014-09-14 DIAGNOSIS — M545 Low back pain: Secondary | ICD-10-CM | POA: Diagnosis not present

## 2014-09-14 DIAGNOSIS — M546 Pain in thoracic spine: Secondary | ICD-10-CM | POA: Diagnosis not present

## 2014-09-25 DIAGNOSIS — M545 Low back pain: Secondary | ICD-10-CM | POA: Diagnosis not present

## 2014-09-25 DIAGNOSIS — M546 Pain in thoracic spine: Secondary | ICD-10-CM | POA: Diagnosis not present

## 2014-09-30 ENCOUNTER — Ambulatory Visit (INDEPENDENT_AMBULATORY_CARE_PROVIDER_SITE_OTHER): Payer: Medicare Other | Admitting: Podiatry

## 2014-09-30 ENCOUNTER — Encounter: Payer: Self-pay | Admitting: Podiatry

## 2014-09-30 DIAGNOSIS — L84 Corns and callosities: Secondary | ICD-10-CM | POA: Diagnosis not present

## 2014-09-30 NOTE — Progress Notes (Signed)
She presents today for a chief complaint of painful callus  Objective: Porokeratotic lesion medial aspect second toe right foot and lateral aspect fifth digit right foot.  Assessment: Porokeratosis   Plan: Debridement of porokeratotic tissue today follow up with her as needed.

## 2014-10-07 DIAGNOSIS — L570 Actinic keratosis: Secondary | ICD-10-CM | POA: Diagnosis not present

## 2014-10-07 DIAGNOSIS — Z85828 Personal history of other malignant neoplasm of skin: Secondary | ICD-10-CM | POA: Diagnosis not present

## 2014-10-07 DIAGNOSIS — D692 Other nonthrombocytopenic purpura: Secondary | ICD-10-CM | POA: Diagnosis not present

## 2014-10-09 ENCOUNTER — Other Ambulatory Visit: Payer: Self-pay | Admitting: Internal Medicine

## 2014-10-14 ENCOUNTER — Encounter: Payer: Self-pay | Admitting: Podiatry

## 2014-10-14 ENCOUNTER — Ambulatory Visit (INDEPENDENT_AMBULATORY_CARE_PROVIDER_SITE_OTHER): Payer: Medicare Other | Admitting: Podiatry

## 2014-10-14 VITALS — BP 122/74 | HR 72 | Resp 12

## 2014-10-14 DIAGNOSIS — Q828 Other specified congenital malformations of skin: Secondary | ICD-10-CM | POA: Diagnosis not present

## 2014-10-14 NOTE — Progress Notes (Signed)
She presents today with chief complaint of a painful lesion to the medial aspect of the second digit of the right foot. She states this is been so painful I tried to call and get him for the past couple of days but there was no one to help me. She states that they really hasn't had any relief since she left our office last time. She states that she's been wearing her corn pads on a regular basis to help soften the area.  Objective: Vital signs are stable she is alert and oriented 3 pulses are palpable bilateral. Evaluation of the second digit does demonstrate a mild porokeratotic lesion to the medial aspect of the second digit consistent with juxtaposition of an spur on the hallux right.  Assessment: Porokeratosis hammertoe deformity and exostosis right.  Plan: Sharp debridement of lesion today without iatrogenic lesions I will follow-up with her as needed.

## 2014-10-15 DIAGNOSIS — M545 Low back pain: Secondary | ICD-10-CM | POA: Diagnosis not present

## 2014-10-18 ENCOUNTER — Other Ambulatory Visit: Payer: Self-pay | Admitting: Internal Medicine

## 2014-10-18 DIAGNOSIS — F419 Anxiety disorder, unspecified: Secondary | ICD-10-CM

## 2014-10-19 ENCOUNTER — Ambulatory Visit: Payer: Medicare Other | Admitting: Podiatry

## 2014-10-21 ENCOUNTER — Ambulatory Visit: Payer: Medicare Other | Admitting: Podiatry

## 2014-10-22 ENCOUNTER — Other Ambulatory Visit: Payer: Self-pay | Admitting: Orthopedic Surgery

## 2014-10-26 ENCOUNTER — Encounter (HOSPITAL_COMMUNITY)
Admission: RE | Admit: 2014-10-26 | Discharge: 2014-10-26 | Disposition: A | Discharge status: Home / Self Care | Payer: Medicare Other | Source: Ambulatory Visit | Attending: Orthopedic Surgery | Admitting: Orthopedic Surgery

## 2014-10-26 ENCOUNTER — Encounter (HOSPITAL_COMMUNITY): Payer: Self-pay

## 2014-10-26 ENCOUNTER — Telehealth: Payer: Self-pay | Admitting: *Deleted

## 2014-10-26 ENCOUNTER — Ambulatory Visit (HOSPITAL_COMMUNITY)
Admission: RE | Admit: 2014-10-26 | Discharge: 2014-10-26 | Disposition: A | Discharge status: Home / Self Care | Payer: Medicare Other | Source: Ambulatory Visit | Attending: Orthopedic Surgery | Admitting: Orthopedic Surgery

## 2014-10-26 DIAGNOSIS — N189 Chronic kidney disease, unspecified: Secondary | ICD-10-CM | POA: Diagnosis not present

## 2014-10-26 DIAGNOSIS — I129 Hypertensive chronic kidney disease with stage 1 through stage 4 chronic kidney disease, or unspecified chronic kidney disease: Secondary | ICD-10-CM | POA: Diagnosis not present

## 2014-10-26 DIAGNOSIS — J449 Chronic obstructive pulmonary disease, unspecified: Secondary | ICD-10-CM | POA: Diagnosis not present

## 2014-10-26 DIAGNOSIS — Z8673 Personal history of transient ischemic attack (TIA), and cerebral infarction without residual deficits: Secondary | ICD-10-CM | POA: Diagnosis not present

## 2014-10-26 DIAGNOSIS — Z01818 Encounter for other preprocedural examination: Secondary | ICD-10-CM | POA: Insufficient documentation

## 2014-10-26 DIAGNOSIS — Z79899 Other long term (current) drug therapy: Secondary | ICD-10-CM | POA: Insufficient documentation

## 2014-10-26 DIAGNOSIS — Z01812 Encounter for preprocedural laboratory examination: Secondary | ICD-10-CM | POA: Insufficient documentation

## 2014-10-26 DIAGNOSIS — J45909 Unspecified asthma, uncomplicated: Secondary | ICD-10-CM | POA: Insufficient documentation

## 2014-10-26 LAB — URINALYSIS, ROUTINE W REFLEX MICROSCOPIC
BILIRUBIN URINE: NEGATIVE
GLUCOSE, UA: NEGATIVE mg/dL
Hgb urine dipstick: NEGATIVE
Ketones, ur: NEGATIVE mg/dL
Leukocytes, UA: NEGATIVE
NITRITE: NEGATIVE
Protein, ur: NEGATIVE mg/dL
SPECIFIC GRAVITY, URINE: 1.009 (ref 1.005–1.030)
Urobilinogen, UA: 0.2 mg/dL (ref 0.0–1.0)
pH: 6 (ref 5.0–8.0)

## 2014-10-26 LAB — CBC WITH DIFFERENTIAL/PLATELET
BASOS ABS: 0.1 10*3/uL (ref 0.0–0.1)
Basophils Relative: 1 % (ref 0–1)
EOS ABS: 0.5 10*3/uL (ref 0.0–0.7)
Eosinophils Relative: 6 % — ABNORMAL HIGH (ref 0–5)
HCT: 35.3 % — ABNORMAL LOW (ref 36.0–46.0)
Hemoglobin: 12 g/dL (ref 12.0–15.0)
LYMPHS ABS: 2.6 10*3/uL (ref 0.7–4.0)
Lymphocytes Relative: 33 % (ref 12–46)
MCH: 31.5 pg (ref 26.0–34.0)
MCHC: 34 g/dL (ref 30.0–36.0)
MCV: 92.7 fL (ref 78.0–100.0)
Monocytes Absolute: 1 10*3/uL (ref 0.1–1.0)
Monocytes Relative: 12 % (ref 3–12)
NEUTROS ABS: 3.6 10*3/uL (ref 1.7–7.7)
Neutrophils Relative %: 48 % (ref 43–77)
Platelets: 352 10*3/uL (ref 150–400)
RBC: 3.81 MIL/uL — ABNORMAL LOW (ref 3.87–5.11)
RDW: 13.2 % (ref 11.5–15.5)
WBC: 7.8 10*3/uL (ref 4.0–10.5)

## 2014-10-26 LAB — COMPREHENSIVE METABOLIC PANEL
ALK PHOS: 74 U/L (ref 38–126)
ALT: 11 U/L — AB (ref 14–54)
ANION GAP: 7 (ref 5–15)
AST: 21 U/L (ref 15–41)
Albumin: 4.1 g/dL (ref 3.5–5.0)
BUN: 13 mg/dL (ref 6–20)
CALCIUM: 9 mg/dL (ref 8.9–10.3)
CHLORIDE: 102 mmol/L (ref 101–111)
CO2: 24 mmol/L (ref 22–32)
Creatinine, Ser: 1.22 mg/dL — ABNORMAL HIGH (ref 0.44–1.00)
GFR calc Af Amer: 51 mL/min — ABNORMAL LOW (ref >60)
GFR calc non Af Amer: 44 mL/min — ABNORMAL LOW (ref >60)
GLUCOSE: 73 mg/dL (ref 65–99)
Potassium: 4.3 mmol/L (ref 3.5–5.1)
SODIUM: 133 mmol/L — AB (ref 135–145)
Total Bilirubin: 0.3 mg/dL (ref 0.3–1.2)
Total Protein: 6.4 g/dL — ABNORMAL LOW (ref 6.5–8.1)

## 2014-10-26 LAB — PROTIME-INR
INR: 1.02 (ref 0.00–1.49)
PROTHROMBIN TIME: 13.6 s (ref 11.6–15.2)

## 2014-10-26 LAB — APTT: aPTT: 38 seconds — ABNORMAL HIGH (ref 24–37)

## 2014-10-26 NOTE — Pre-Procedure Instructions (Addendum)
    JAZZIE TRAMPE  10/26/2014      [id number] - [place], [place] [facility] [address] Phone: [phone] Fax: [phone]    Your procedure is scheduled on 10-28-2014   Thursday      Report to [facility] Admitting at 7:30  A.M.   Call this number if you have problems the morning of surgery:  [phone]   Remember:  Do not eat food or drink liquids after midnight.   Take these medicines the morning of surgery with A SIP OF WATER lorazepam,montelukest,oxybutynin     Do not wear jewelry, make-up or nail polish.No watches or rings  Do not wear lotions, powders, or perfumes.  .  Do not shave 48 hours prior to surgery.     Do not bring valuables to the hospital.  [facility] is not responsible for any belongings or valuables.  Contacts, dentures or bridgework may not be worn into surgery.  Leave your suitcase in the car.  After surgery it may be brought to your room.  For patients admitted to the hospital, discharge time will be determined by your treatment team.  Patients discharged the day of surgery will not be allowed to drive home.    Special instructions:  See attached Sheet "Preparing for Surgery" for instructions on CHG shower  Please read over the following fact sheets that you were given. Pain Booklet and Surgical Site Infection Prevention

## 2014-10-26 NOTE — Telephone Encounter (Signed)
Per Dr Melford Aase, patient needs to get her refills for pain med, Tramadol, from her orthopedic doctor.  Left her a message to inform patient.

## 2014-10-27 NOTE — H&P (Signed)
PREOPERATIVE H&P  Chief Complaint: Mid back pain  HPI: [name] is a 69 y.o. female who presents with ongoing pain in the mid back  MRI reveals edema in the L1 vertebral body consistent with a subacute compression fracture  Patient has failed multiple forms of conservative care and continues to have pain (see office notes for additional details regarding the patient's full course of treatment)  Past Medical History  Diagnosis Date  . Hypertension   . GERD (gastroesophageal reflux disease)   . Stroke     was on Plavix - not now  . Carotid artery aneurysm 03/04/2012  . DJD (degenerative joint disease)   . COPD (chronic obstructive pulmonary disease)   . Depression    Past Surgical History  Procedure Laterality Date  . Video bronchoscopy  04/24/2012    Procedure: VIDEO BRONCHOSCOPY WITHOUT FLUORO;  Surgeon: Rigoberto Noel, MD;  Location: [place];  Service: Cardiopulmonary;  Laterality: Bilateral;  . Tubal ligation  30 years  . Tonsillectomy    . Colonoscopy  2004  . Hernia repair Right 2014    inguinal  . Cataract extraction Bilateral   . Microlaryngoscopy with co2 laser and excision of vocal cord lesion N/A 06/05/2013    Procedure: MICROLARYNGOSCOPY WITH CO2 LASER AND EXCISION OF VOCAL CORD POLYPS;  Surgeon: Melida Quitter, MD;  Location: [place];  Service: ENT;  Laterality: N/A;   History   Social History  . Marital Status: Single    Spouse Name: N/A  . Number of Children: N/A  . Years of Education: N/A   Occupational History  . retired    Social History Main Topics  . Smoking status: Current Every Day Smoker -- 0.25 packs/day for 30 years    Types: Cigarettes  . Smokeless tobacco: Never Used  . Alcohol Use: No  . Drug Use: No  . Sexual Activity: Not on file   Other Topics Concern  . Not on file   Social History Narrative   Family History  Problem Relation Age of Onset  . Breast cancer Mother   . Breast cancer Sister    Allergies  Allergen  Reactions  . Amoxapine And Related Nausea And Vomiting  . Amoxicillin     itching  . Ciprocinonide [Fluocinolone]     Pt unsure  . Ciprofloxacin     Pt unsure  . Doxycycline Nausea And Vomiting  . Erythromycin Hives  . Sulfa Antibiotics Other (See Comments)    Unknown-reaction as a child  . Zofran [Ondansetron Hcl] Nausea And Vomiting   Prior to Admission medications   Medication Sig Start Date End Date Taking? Authorizing Provider  aspirin EC 81 MG tablet Take 81 mg by mouth daily.    Yes Historical Provider, MD  b complex vitamins tablet Take 1 tablet by mouth daily.   Yes Historical Provider, MD  cholecalciferol (VITAMIN D) 1000 UNITS tablet Take 5,000 Units by mouth daily.   Yes Historical Provider, MD  gabapentin (NEURONTIN) 300 MG capsule Take 600 mg by mouth at bedtime.   Yes Historical Provider, MD  HYDROcodone-acetaminophen (NORCO/VICODIN) 5-325 MG per tablet Take 1 tablet by mouth 2 (two) times daily as needed. 10/15/14  Yes Historical Provider, MD  Iron-Vitamin C (VITRON-C PO) Take 1 tablet by mouth. Takes every other day   Yes Historical Provider, MD  loperamide (IMODIUM A-D) 2 MG tablet Take 2 mg by mouth as needed for diarrhea or loose stools. Per box as needed for diarrhea  Yes Historical Provider, MD  LORazepam (ATIVAN) 1 MG tablet TAKE 1/2 TO 1 TABLET BY MOUTH THREE TIMES DAILY AS NEEDED FOR ANXIETY OR HOT FLASHES 10/18/14 04/20/15 Yes Unk Pinto, MD  losartan (COZAAR) 100 MG tablet Take 100 mg by mouth daily.   Yes Historical Provider, MD  Magnesium 500 MG TABS Take 500 mg by mouth daily.    Yes Historical Provider, MD  mirtazapine (REMERON) 45 MG tablet TAKE 1 TABLET BY MOUTH EVERY NIGHT AT BEDTIME 05/09/14  Yes Unk Pinto, MD  montelukast (SINGULAIR) 10 MG tablet TAKE 1 TABLET BY MOUTH ONCE A DAY 05/31/14 05/31/15 Yes Unk Pinto, MD  oxybutynin (DITROPAN) 5 MG tablet TAKE 1/2 TO 1 TABLET BY MOUTH THREE TIMES DAILY FOR BLADDER CONTROL 10/09/14  Yes Vicie Mutters, PA-C  polyethylene glycol (MIRALAX / GLYCOLAX) packet Take 17 g by mouth daily. 03/25/14  Yes Jennifer Couillard, PA-C  QUEtiapine (SEROQUEL) 400 MG tablet Take 1 tablet (400 mg total) by mouth at bedtime. 04/07/14  Yes Unk Pinto, MD  quinapril (ACCUPRIL) 40 MG tablet TAKE 1 TABLET BY MOUTH EVERY NIGHT AT BEDTIME 05/22/14  Yes Unk Pinto, MD  traMADol (ULTRAM) 50 MG tablet TAKE ONE TABLET BY MOUTH THREE TIMES DAILY AS NEEDED FOR PAIN 08/30/14  Yes Unk Pinto, MD  vitamin C (ASCORBIC ACID) 500 MG tablet Take 500 mg by mouth daily.   Yes Historical Provider, MD  mometasone-formoterol (DULERA) 100-5 MCG/ACT AERO Inhale 2 puffs into the lungs 2 (two) times daily. Patient not taking: Reported on 10/26/2014 06/19/13   Rigoberto Noel, MD  QUEtiapine (SEROQUEL) 400 MG tablet TAKE 1 TABLET BY MOUTH EVERY NIGHT AT BEDTIME 08/20/14   Unk Pinto, MD  ranitidine (ZANTAC) 300 MG tablet Take 1 to 2 tablets daily for heartburn & reflux to allow wean and transition from PPI / omepraprazole Patient not taking: Reported on 10/26/2014 08/11/14 08/11/15  Unk Pinto, MD     All other systems have been reviewed and were otherwise negative with the exception of those mentioned in the HPI and as above.  Physical Exam: There were no vitals filed for this visit.  General: Alert, no acute distress Cardiovascular: No pedal edema Respiratory: No cyanosis, no use of accessory musculature Skin: No lesions in the area of chief complaint Neurologic: Sensation intact distally Psychiatric: Patient is competent for consent with normal mood and affect Lymphatic: No axillary or cervical lymphadenopathy  MUSCULOSKELETAL: + TTP mid back  Assessment/Plan: Lumbar 1 compression fracture  Plan for Procedure(s): KYPHOPLASTY   [name], MD 10/27/2014 1:44 PM

## 2014-10-27 NOTE — Progress Notes (Signed)
Anesthesia Chart Review:Patient is a 69 year old female scheduled for L1 kyphoplasty tomorrow with Dr. Lynann Bologna.  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 PCP is Dr. Melford Aase.Pulmonologist is Dr. Elsworth Soho. She was evaluated by Cardiologist Dr. Acie Fredrickson in 03/2012 prior to a hernia surgery and only PRN follow-up was recommended.   Meds include ASA (on hold), Neurontin, Norco, Ativan, losartan, Magnesium, Remeron, Singulair, Ditropan, Seroquel, quinapril, tramadol.   04/29/14 EKG: SR, occasional PAC/PVC (versus artifact). Non-specific inferior ST abnormality may be due to baseline wanderer and artifact. Low voltage in limb leads.  07/02/13 Echo:  Study Conclusions - Left ventricle: The cavity size was normal. Systolic function was normal. The estimated ejection fraction was in the range of 55% to 60%. Wall motion was normal; there were no regional wall motion abnormalities. - Mitral valve: Calcified annulus. Mildly thickened leaflets. - Left atrium: The atrium was mildly dilated. - Atrial septum: No defect or patent foramen ovale was identified.  03/10/10 MRA Head: Prominent intracranial atherosclerotic type changes as noted above (see report under Results Review tab). Suspicion for left internal carotid artery cavernous segment 3.2 mm aneurysm. (MRI/MRA reviewed and patient seen by neurologist Dr. Brett Fairy at that time. Her assessment states, "I think fusiform aneurysm is not likely to cause any bleeding. It is following a stenosis. If the stenosis would be higher grade, then now I would actually send the patient for a carotid endarterectomy. I feel that this is safe for her to return to Dr. Franklyn Lor office and have carotid Doppler studies at least yearly performed.")  10/26/14 CXR: IMPRESSION: COPD. There is no active cardiopulmonary  disease.  03/28/12 PFTs: FVC 1.39 (52%), FEV1 0.82 (39%), FEV1/FVC 59% (76%), FEF25-75% 0.34 (17%).  Preoperative labs noted. Cr 1.22. H/H 12.0/35.3. PT/INR WNL. PTT 38.   I don't see on-going follow-up in Epic regarding her LICA aneurysm. Discussed with anesthesiologist Dr. Marcie Bal. In the absence of new neurological symptoms, would plan to proceed with kyphoplasty but recommend future follow-up or referral as directed by her PCP. I sent a staff message to Dr. Melford Aase and Starlyn Skeans, PA-C requesting they discuss recommendations with patient at her next visit at their office.  George Hugh [facility] Stay [facility]/Anesthesiology Phone [phone] 10/27/2014 2:25 PM

## 2014-10-28 ENCOUNTER — Encounter (HOSPITAL_COMMUNITY)
Admission: RE | Disposition: A | Discharge status: Home / Self Care | Payer: Self-pay | Source: Ambulatory Visit | Attending: Orthopedic Surgery

## 2014-10-28 ENCOUNTER — Encounter (HOSPITAL_COMMUNITY): Payer: Self-pay | Admitting: *Deleted

## 2014-10-28 ENCOUNTER — Ambulatory Visit (HOSPITAL_COMMUNITY): Payer: Medicare Other | Admitting: Vascular Surgery

## 2014-10-28 ENCOUNTER — Ambulatory Visit (HOSPITAL_COMMUNITY)
Admission: RE | Admit: 2014-10-28 | Discharge: 2014-10-28 | Disposition: A | Discharge status: Home / Self Care | Payer: Medicare Other | Source: Ambulatory Visit | Attending: Orthopedic Surgery | Admitting: Orthopedic Surgery

## 2014-10-28 ENCOUNTER — Ambulatory Visit (HOSPITAL_COMMUNITY): Payer: Medicare Other | Admitting: Anesthesiology

## 2014-10-28 ENCOUNTER — Other Ambulatory Visit: Payer: Self-pay | Admitting: Internal Medicine

## 2014-10-28 DIAGNOSIS — Z8673 Personal history of transient ischemic attack (TIA), and cerebral infarction without residual deficits: Secondary | ICD-10-CM | POA: Diagnosis not present

## 2014-10-28 DIAGNOSIS — I10 Essential (primary) hypertension: Secondary | ICD-10-CM | POA: Insufficient documentation

## 2014-10-28 DIAGNOSIS — S32010A Wedge compression fracture of first lumbar vertebra, initial encounter for closed fracture: Secondary | ICD-10-CM | POA: Diagnosis not present

## 2014-10-28 DIAGNOSIS — I739 Peripheral vascular disease, unspecified: Secondary | ICD-10-CM | POA: Insufficient documentation

## 2014-10-28 DIAGNOSIS — Z7982 Long term (current) use of aspirin: Secondary | ICD-10-CM | POA: Diagnosis not present

## 2014-10-28 DIAGNOSIS — I720 Aneurysm of carotid artery: Secondary | ICD-10-CM

## 2014-10-28 DIAGNOSIS — F329 Major depressive disorder, single episode, unspecified: Secondary | ICD-10-CM | POA: Insufficient documentation

## 2014-10-28 DIAGNOSIS — Z9889 Other specified postprocedural states: Secondary | ICD-10-CM | POA: Diagnosis not present

## 2014-10-28 DIAGNOSIS — F17210 Nicotine dependence, cigarettes, uncomplicated: Secondary | ICD-10-CM | POA: Insufficient documentation

## 2014-10-28 DIAGNOSIS — Z419 Encounter for procedure for purposes other than remedying health state, unspecified: Secondary | ICD-10-CM

## 2014-10-28 DIAGNOSIS — M4856XA Collapsed vertebra, not elsewhere classified, lumbar region, initial encounter for fracture: Secondary | ICD-10-CM | POA: Diagnosis not present

## 2014-10-28 DIAGNOSIS — J449 Chronic obstructive pulmonary disease, unspecified: Secondary | ICD-10-CM | POA: Diagnosis not present

## 2014-10-28 DIAGNOSIS — K219 Gastro-esophageal reflux disease without esophagitis: Secondary | ICD-10-CM | POA: Diagnosis not present

## 2014-10-28 DIAGNOSIS — M1990 Unspecified osteoarthritis, unspecified site: Secondary | ICD-10-CM | POA: Diagnosis not present

## 2014-10-28 DIAGNOSIS — Z79899 Other long term (current) drug therapy: Secondary | ICD-10-CM | POA: Insufficient documentation

## 2014-10-28 HISTORY — PX: KYPHOPLASTY: SHX5884

## 2014-10-28 SURGERY — KYPHOPLASTY
Anesthesia: General | Site: Spine Lumbar

## 2014-10-28 MED ORDER — FENTANYL CITRATE (PF) 100 MCG/2ML IJ SOLN
INTRAMUSCULAR | Status: DC | PRN
  Administered 2014-10-28: 50 ug via INTRAVENOUS

## 2014-10-28 MED ORDER — GLYCOPYRROLATE 0.2 MG/ML IJ SOLN
INTRAMUSCULAR | Status: DC | PRN
  Administered 2014-10-28: 0.4 mg via INTRAVENOUS

## 2014-10-28 MED ORDER — NEOSTIGMINE METHYLSULFATE 10 MG/10ML IV SOLN
INTRAVENOUS | Status: DC | PRN
  Administered 2014-10-28: 3 mg via INTRAVENOUS

## 2014-10-28 MED ORDER — FENTANYL CITRATE (PF) 250 MCG/5ML IJ SOLN
INTRAMUSCULAR | Status: AC
  Filled 2014-10-28: qty 5

## 2014-10-28 MED ORDER — FENTANYL CITRATE (PF) 100 MCG/2ML IJ SOLN: 25.0000 ug | INTRAMUSCULAR | Status: DC | PRN

## 2014-10-28 MED ORDER — LACTATED RINGERS IV SOLN
INTRAVENOUS | Status: DC
  Administered 2014-10-28: 08:00:00 via INTRAVENOUS

## 2014-10-28 MED ORDER — VANCOMYCIN HCL IN DEXTROSE 1-5 GM/200ML-% IV SOLN
INTRAVENOUS | Status: AC
  Filled 2014-10-28: qty 200

## 2014-10-28 MED ORDER — PROPOFOL 10 MG/ML IV BOLUS
INTRAVENOUS | Status: AC
  Filled 2014-10-28: qty 20

## 2014-10-28 MED ORDER — VANCOMYCIN HCL IN DEXTROSE 1-5 GM/200ML-% IV SOLN
1000.0000 mg | INTRAVENOUS | Status: AC
  Administered 2014-10-28: 1000 mg via INTRAVENOUS

## 2014-10-28 MED ORDER — ROCURONIUM BROMIDE 100 MG/10ML IV SOLN
INTRAVENOUS | Status: DC | PRN
  Administered 2014-10-28: 20 mg via INTRAVENOUS

## 2014-10-28 MED ORDER — BACITRACIN ZINC 500 UNIT/GM EX OINT
TOPICAL_OINTMENT | CUTANEOUS | Status: AC
  Filled 2014-10-28: qty 28.35

## 2014-10-28 MED ORDER — HYDROCODONE-ACETAMINOPHEN 7.5-325 MG PO TABS: 1.0000 | ORAL_TABLET | Freq: Once | ORAL | Status: DC | PRN

## 2014-10-28 MED ORDER — BUPIVACAINE-EPINEPHRINE (PF) 0.25% -1:200000 IJ SOLN
INTRAMUSCULAR | Status: AC
  Filled 2014-10-28: qty 30

## 2014-10-28 MED ORDER — EPHEDRINE SULFATE 50 MG/ML IJ SOLN
INTRAMUSCULAR | Status: AC
  Filled 2014-10-28: qty 1

## 2014-10-28 MED ORDER — PROMETHAZINE HCL 25 MG/ML IJ SOLN: 6.2500 mg | INTRAMUSCULAR | Status: DC | PRN

## 2014-10-28 MED ORDER — ROCURONIUM BROMIDE 50 MG/5ML IV SOLN
INTRAVENOUS | Status: AC
  Filled 2014-10-28: qty 1

## 2014-10-28 MED ORDER — LIDOCAINE HCL (CARDIAC) 20 MG/ML IV SOLN
INTRAVENOUS | Status: DC | PRN
  Administered 2014-10-28: 40 mg via INTRAVENOUS

## 2014-10-28 MED ORDER — SODIUM CHLORIDE 0.9 % IJ SOLN
INTRAMUSCULAR | Status: AC
  Filled 2014-10-28: qty 10

## 2014-10-28 MED ORDER — PROPOFOL 10 MG/ML IV BOLUS
INTRAVENOUS | Status: DC | PRN
  Administered 2014-10-28: 100 mg via INTRAVENOUS

## 2014-10-28 MED ORDER — POVIDONE-IODINE 7.5 % EX SOLN
Freq: Once | CUTANEOUS | Status: DC
  Filled 2014-10-28: qty 118

## 2014-10-28 SURGICAL SUPPLY — 41 items
BANDAGE ADH SHEER 1  50/CT (GAUZE/BANDAGES/DRESSINGS) ×6 IMPLANT
BLADE SURG 15 STRL LF DISP TIS (BLADE) ×1 IMPLANT
BLADE SURG 15 STRL SS (BLADE) ×2
CEMENT BONE KYPHX HV R (Orthopedic Implant) ×3 IMPLANT
CEMENT KYPHON C01A KIT/MIXER (Cement) ×3 IMPLANT
COVER MAYO STAND STRL (DRAPES) ×3 IMPLANT
COVER SURGICAL LIGHT HANDLE (MISCELLANEOUS) ×3 IMPLANT
CURETTE WEDGE 8.5MM KYPHX (MISCELLANEOUS) ×3 IMPLANT
DERMABOND ADVANCED (GAUZE/BANDAGES/DRESSINGS)
DERMABOND ADVANCED .7 DNX12 (GAUZE/BANDAGES/DRESSINGS) IMPLANT
DRAPE C-ARM 42X72 X-RAY (DRAPES) ×3 IMPLANT
DRAPE INCISE IOBAN 66X45 STRL (DRAPES) ×3 IMPLANT
DRAPE LAPAROTOMY T 102X78X121 (DRAPES) ×3 IMPLANT
DRAPE SURG 17X23 STRL (DRAPES) ×12 IMPLANT
DURAPREP 26ML APPLICATOR (WOUND CARE) ×3 IMPLANT
GAUZE SPONGE 4X4 16PLY XRAY LF (GAUZE/BANDAGES/DRESSINGS) ×3 IMPLANT
GLOVE BIO SURGEON STRL SZ7 (GLOVE) ×3 IMPLANT
GLOVE BIO SURGEON STRL SZ8 (GLOVE) ×3 IMPLANT
GLOVE BIOGEL PI IND STRL 7.0 (GLOVE) ×1 IMPLANT
GLOVE BIOGEL PI IND STRL 8 (GLOVE) ×1 IMPLANT
GLOVE BIOGEL PI INDICATOR 7.0 (GLOVE) ×2
GLOVE BIOGEL PI INDICATOR 8 (GLOVE) ×2
GOWN STRL REUS W/ TWL LRG LVL3 (GOWN DISPOSABLE) ×2 IMPLANT
GOWN STRL REUS W/ TWL XL LVL3 (GOWN DISPOSABLE) ×1 IMPLANT
GOWN STRL REUS W/TWL LRG LVL3 (GOWN DISPOSABLE) ×4
GOWN STRL REUS W/TWL XL LVL3 (GOWN DISPOSABLE) ×2
KIT BASIN OR (CUSTOM PROCEDURE TRAY) ×3 IMPLANT
KIT ROOM TURNOVER OR (KITS) ×3 IMPLANT
NEEDLE 22X1 1/2 (OR ONLY) (NEEDLE) IMPLANT
NEEDLE HYPO 25X1 1.5 SAFETY (NEEDLE) IMPLANT
NEEDLE SPNL 18GX3.5 QUINCKE PK (NEEDLE) ×6 IMPLANT
NS IRRIG 1000ML POUR BTL (IV SOLUTION) ×3 IMPLANT
PACK SURGICAL SETUP 50X90 (CUSTOM PROCEDURE TRAY) ×3 IMPLANT
PAD ARMBOARD 7.5X6 YLW CONV (MISCELLANEOUS) ×6 IMPLANT
POSITIONER HEAD PRONE TRACH (MISCELLANEOUS) ×3 IMPLANT
SUT MNCRL AB 4-0 PS2 18 (SUTURE) ×3 IMPLANT
SYR BULB IRRIGATION 50ML (SYRINGE) ×3 IMPLANT
SYR CONTROL 10ML LL (SYRINGE) ×3 IMPLANT
TOWEL OR 17X24 6PK STRL BLUE (TOWEL DISPOSABLE) ×3 IMPLANT
TOWEL OR 17X26 10 PK STRL BLUE (TOWEL DISPOSABLE) ×3 IMPLANT
TRAY KYPHOPAK 15/3 ONESTEP 1ST (MISCELLANEOUS) ×3 IMPLANT

## 2014-10-28 NOTE — Transfer of Care (Signed)
Immediate Anesthesia Transfer of Care Note  Patient: [name]  Procedure(s) Performed: Procedure(s) with comments: KYPHOPLASTY (N/A) - Lumbar 1 kyphoplasty  Patient Location: PACU  Anesthesia Type:General  Level of Consciousness: awake, alert , oriented and patient cooperative  Airway & Oxygen Therapy: Patient Spontanous Breathing and Patient connected to nasal cannula oxygen  Post-op Assessment: Report given to RN and Post -op Vital signs reviewed and stable  Post vital signs: Reviewed and stable  Last Vitals:  Filed Vitals:   10/28/14 0829  BP:   Pulse: 61  Temp:   Resp:     Complications: Broken tooth

## 2014-10-28 NOTE — Anesthesia Postprocedure Evaluation (Signed)
  Anesthesia Post-op Note  Patient: [name]  Procedure(s) Performed: Procedure(s) with comments: KYPHOPLASTY (N/A) - Lumbar 1 kyphoplasty  Patient Location: PACU  Anesthesia Type:General  Level of Consciousness: awake, alert  and oriented  Airway and Oxygen Therapy: Patient Spontanous Breathing  Post-op Pain: none  Post-op Assessment: Post-op Vital signs reviewed LLE Motor Response: Purposeful movement LLE Sensation: Full sensation RLE Motor Response: Purposeful movement RLE Sensation: Full sensation      Post-op Vital Signs: Reviewed  Last Vitals:  Filed Vitals:   10/28/14 1219  BP: 174/83  Pulse: 91  Temp:   Resp: 14    Complications: Upon extubation broken tooth discovered in OR. Pt with poor dentition previously and warned of the risks of airway manipulation. Pt very understanding prior to discharge.

## 2014-10-28 NOTE — Anesthesia Procedure Notes (Signed)
Procedure Name: Intubation Date/Time: 10/28/2014 10:02 AM Performed by: Manuela Schwartz B Pre-anesthesia Checklist: Patient identified, Emergency Drugs available, Suction available, Patient being monitored and Timeout performed Patient Re-evaluated:Patient Re-evaluated prior to inductionOxygen Delivery Method: Circle system utilized Preoxygenation: Pre-oxygenation with 100% oxygen Intubation Type: IV induction Ventilation: Mask ventilation without difficulty and Oral airway inserted - appropriate to patient size Laryngoscope Size: Mac and 3 Grade View: Grade I Tube type: Oral Tube size: 7.0 mm Number of attempts: 1 Airway Equipment and Method: Stylet Placement Confirmation: ETT inserted through vocal cords under direct vision,  positive ETCO2 and breath sounds checked- equal and bilateral Secured at: 21 cm Tube secured with: Tape Dental Injury: Teeth and Oropharynx as per pre-operative assessment

## 2014-10-28 NOTE — Progress Notes (Signed)
Dr Deatra Canter here & spoke to pt re tooth thet cam out in OR upon extubation. Also, aware SBP 170's (as it was preop). BRP w/asst---voided lg amt.

## 2014-10-28 NOTE — Anesthesia Preprocedure Evaluation (Addendum)
Anesthesia Evaluation  Patient identified by MRN, date of birth, ID band Patient awake    Reviewed: Allergy & Precautions, NPO status , Patient's Chart, lab work & pertinent test results  Airway Mallampati: III  TM Distance: <3 FB Neck ROM: Full    Dental  (+) Dental Advisory Given, Poor Dentition   Pulmonary COPD COPD inhaler, Current Smoker,  breath sounds clear to auscultation        Cardiovascular hypertension, Pt. on medications + Peripheral Vascular Disease Rhythm:Regular Rate:Normal     Neuro/Psych CVA    GI/Hepatic Neg liver ROS, GERD-  ,  Endo/Other  negative endocrine ROS  Renal/GU CRFRenal disease     Musculoskeletal  (+) Arthritis -,   Abdominal   Peds  Hematology negative hematology ROS (+)   Anesthesia Other Findings   Reproductive/Obstetrics                            Anesthesia Physical Anesthesia Plan  ASA: III  Anesthesia Plan: General   Post-op Pain Management:    Induction: Intravenous  Airway Management Planned: Oral ETT  Additional Equipment:   Intra-op Plan:   Post-operative Plan: Extubation in OR and Possible Post-op intubation/ventilation  Informed Consent: I have reviewed the patients History and Physical, chart, labs and discussed the procedure including the risks, benefits and alternatives for the proposed anesthesia with the patient or authorized representative who has indicated his/her understanding and acceptance.   Dental advisory given  Plan Discussed with: CRNA  Anesthesia Plan Comments:         Anesthesia Quick Evaluation

## 2014-10-29 ENCOUNTER — Encounter (HOSPITAL_COMMUNITY): Payer: Self-pay | Admitting: Orthopedic Surgery

## 2014-10-29 ENCOUNTER — Other Ambulatory Visit: Payer: Self-pay | Admitting: Internal Medicine

## 2014-10-29 DIAGNOSIS — I720 Aneurysm of carotid artery: Secondary | ICD-10-CM

## 2014-10-29 NOTE — Op Note (Signed)
[name], [name] NO.:  [id number]  MEDICAL RECORD NO.:  [id number]  LOCATION:  [facility]                         FACILITY:  [facility]  PHYSICIAN:  Phylliss Bob, MD      DATE OF BIRTH:  Sep 24, 1945  DATE OF PROCEDURE:  10/28/2014 DATE OF DISCHARGE:  10/28/2014                              OPERATIVE REPORT   PREOPERATIVE DIAGNOSIS:  L1 compression fracture.  POSTOPERATIVE DIAGNOSIS:  L1 compression fracture.  PROCEDURE:  L1 kyphoplasty.  SURGEON:  Phylliss Bob, MD  ASSISTANT:  Pricilla Holm, PA-C.  ANESTHESIA:  General endotracheal anesthesia.  COMPLICATIONS:  None.  DISPOSITION:  Stable.  ESTIMATED BLOOD LOSS:  Minimal.  INDICATIONS FOR SURGERY:  Briefly, [name] is a 69 year old female, who did present to me with ongoing pain in the low back.  An MRI was notable for a non-healed L1 compression fracture.  Some of her pain was consistent with an L1 compression fracture, and we did discuss proceeding with a kyphoplasty.  She did elect to proceed.  OPERATIVE DETAILS:  On October 28, 2014, the patient was brought to surgery and general endotracheal anesthesia was administered.  The patient was placed prone on a flat Jackson bed with Gelly rolls placed under the patient's chest and hips.  Antibiotics were given.  AP and lateral fluoroscopy was brought into the appropriate positioning.  I then made 2 stab incisions just lateral to the L1 pedicles bilaterally. I then advanced Jamshidi needles across the L1 pedicles into the L1 vertebral body.  I then drilled through the Jamshidi.  I did also use a curette.  I did also use kyphoplasty balloons.  Each balloon was inflated with approximately 2 mL of contrast.  I then introduced approximately 5 mL of cement into the vertebral body.  I did note excellent interdigitation of the cement into the L1 vertebral body. There was no abnormal extravasation of cement into the spinal canal or into the  retroperitoneal space.  I was very pleased with the final fluoroscopic images.  The cement was then allowed to harden.  The wound was then closed with 3-0 Monocryl.  Benzoin and Steri-Strips were applied.  Of note, Pricilla Holm was my assistant for surgery.     Phylliss Bob, MD     MD/MEDQ  D:  10/28/2014  T:  10/28/2014  Job:  [id number]

## 2014-11-01 ENCOUNTER — Ambulatory Visit (HOSPITAL_COMMUNITY): Admission: RE | Admit: 2014-11-01 | Payer: Medicare Other | Source: Ambulatory Visit

## 2014-11-04 ENCOUNTER — Encounter: Payer: Self-pay | Admitting: Podiatry

## 2014-11-04 ENCOUNTER — Ambulatory Visit (INDEPENDENT_AMBULATORY_CARE_PROVIDER_SITE_OTHER): Payer: Medicare Other | Admitting: Podiatry

## 2014-11-04 DIAGNOSIS — M79673 Pain in unspecified foot: Secondary | ICD-10-CM

## 2014-11-04 DIAGNOSIS — B351 Tinea unguium: Secondary | ICD-10-CM | POA: Diagnosis not present

## 2014-11-04 NOTE — Progress Notes (Signed)
She presents today with a chief complaint of painful elongated toenails to toenails 1 through 5 bilateral.  Objective: Vital signs are stable she is alert and oriented 3. Pulses are palpable bilateral. No open lesions or wounds to the plantar aspect of the bilateral foot hammertoe deformities are noted. Her toenails are thick yellow dystrophic likely mycotic severely incurvated and painful on palpation as well as debridement.  Assessment: Pain in limb secondary to onychomycosis 1 through 5 bilateral toes.  Plan: Debridement of toenails 1 through 5 bilateral. Follow up with her in 3 months

## 2014-11-10 ENCOUNTER — Other Ambulatory Visit: Payer: Self-pay | Admitting: Internal Medicine

## 2014-11-10 DIAGNOSIS — Z9889 Other specified postprocedural states: Secondary | ICD-10-CM | POA: Diagnosis not present

## 2014-11-18 ENCOUNTER — Ambulatory Visit: Payer: Medicare Other | Admitting: Internal Medicine

## 2014-11-18 NOTE — Progress Notes (Signed)
     C  A N  C  E L  L  E  D          

## 2014-11-18 NOTE — Patient Instructions (Signed)

## 2014-11-22 DIAGNOSIS — M545 Low back pain: Secondary | ICD-10-CM | POA: Diagnosis not present

## 2014-11-25 ENCOUNTER — Encounter: Payer: Self-pay | Admitting: Podiatry

## 2014-11-25 ENCOUNTER — Ambulatory Visit (INDEPENDENT_AMBULATORY_CARE_PROVIDER_SITE_OTHER): Payer: Medicare Other | Admitting: Podiatry

## 2014-11-25 DIAGNOSIS — Q828 Other specified congenital malformations of skin: Secondary | ICD-10-CM

## 2014-11-25 DIAGNOSIS — B351 Tinea unguium: Secondary | ICD-10-CM

## 2014-11-25 DIAGNOSIS — M79676 Pain in unspecified toe(s): Secondary | ICD-10-CM

## 2014-11-26 ENCOUNTER — Other Ambulatory Visit: Payer: Self-pay | Admitting: Internal Medicine

## 2014-11-26 NOTE — Progress Notes (Signed)
She presents today with a chief complaint of painful ingrown toenails thick toenails and corns and calluses.  Objective: Vital signs are stable she is alert and oriented 3. Pulses are palpable bilateral. Hammertoe deformities resulting in distal clavi porokeratosis and thickened toenails. Her toenails appear to be thick with nail dystrophy as well as onychomycosis and are painful on palpation as well as debridement.  Assessment: Pain and limb secondary to porokeratosis distal clavi and onychomycosis.  Plan: Discussed etiology pathology conservative versus surgical therapies. Debrided toenails 1 through 5 bilateral. Debridement all reactive hyperkeratosis bilateral. Remember to ask her how her ribs feel.  Roselind Messier DPM

## 2014-11-27 DIAGNOSIS — M545 Low back pain: Secondary | ICD-10-CM | POA: Diagnosis not present

## 2014-11-27 DIAGNOSIS — M546 Pain in thoracic spine: Secondary | ICD-10-CM | POA: Diagnosis not present

## 2014-12-03 DIAGNOSIS — M546 Pain in thoracic spine: Secondary | ICD-10-CM | POA: Diagnosis not present

## 2014-12-15 ENCOUNTER — Ambulatory Visit (INDEPENDENT_AMBULATORY_CARE_PROVIDER_SITE_OTHER): Payer: Medicare Other | Admitting: Internal Medicine

## 2014-12-15 ENCOUNTER — Encounter: Payer: Self-pay | Admitting: Internal Medicine

## 2014-12-15 VITALS — BP 136/82 | HR 72 | Temp 97.3°F | Resp 16 | Ht 61.0 in | Wt 108.6 lb

## 2014-12-15 DIAGNOSIS — E559 Vitamin D deficiency, unspecified: Secondary | ICD-10-CM

## 2014-12-15 DIAGNOSIS — Z23 Encounter for immunization: Secondary | ICD-10-CM

## 2014-12-15 DIAGNOSIS — Z6820 Body mass index (BMI) 20.0-20.9, adult: Secondary | ICD-10-CM

## 2014-12-15 DIAGNOSIS — R7309 Other abnormal glucose: Secondary | ICD-10-CM | POA: Diagnosis not present

## 2014-12-15 DIAGNOSIS — N183 Chronic kidney disease, stage 3 unspecified: Secondary | ICD-10-CM

## 2014-12-15 DIAGNOSIS — E782 Mixed hyperlipidemia: Secondary | ICD-10-CM

## 2014-12-15 DIAGNOSIS — J449 Chronic obstructive pulmonary disease, unspecified: Secondary | ICD-10-CM

## 2014-12-15 DIAGNOSIS — Z79899 Other long term (current) drug therapy: Secondary | ICD-10-CM

## 2014-12-15 DIAGNOSIS — I10 Essential (primary) hypertension: Secondary | ICD-10-CM | POA: Diagnosis not present

## 2014-12-15 DIAGNOSIS — R7303 Prediabetes: Secondary | ICD-10-CM

## 2014-12-15 DIAGNOSIS — M545 Low back pain: Secondary | ICD-10-CM | POA: Diagnosis not present

## 2014-12-15 LAB — CBC WITH DIFFERENTIAL/PLATELET
BASOS PCT: 1 % (ref 0–1)
Basophils Absolute: 0.1 10*3/uL (ref 0.0–0.1)
EOS ABS: 1 10*3/uL — AB (ref 0.0–0.7)
Eosinophils Relative: 10 % — ABNORMAL HIGH (ref 0–5)
HCT: 34.7 % — ABNORMAL LOW (ref 36.0–46.0)
Hemoglobin: 11.6 g/dL — ABNORMAL LOW (ref 12.0–15.0)
LYMPHS ABS: 3 10*3/uL (ref 0.7–4.0)
Lymphocytes Relative: 32 % (ref 12–46)
MCH: 31.9 pg (ref 26.0–34.0)
MCHC: 33.4 g/dL (ref 30.0–36.0)
MCV: 95.3 fL (ref 78.0–100.0)
MPV: 9.1 fL (ref 8.6–12.4)
Monocytes Absolute: 1.3 10*3/uL — ABNORMAL HIGH (ref 0.1–1.0)
Monocytes Relative: 14 % — ABNORMAL HIGH (ref 3–12)
NEUTROS PCT: 43 % (ref 43–77)
Neutro Abs: 4.1 10*3/uL (ref 1.7–7.7)
PLATELETS: 336 10*3/uL (ref 150–400)
RBC: 3.64 MIL/uL — ABNORMAL LOW (ref 3.87–5.11)
RDW: 14.6 % (ref 11.5–15.5)
WBC: 9.5 10*3/uL (ref 4.0–10.5)

## 2014-12-15 NOTE — Patient Instructions (Signed)

## 2014-12-15 NOTE — Progress Notes (Signed)
Patient ID: [name], female   DOB: 27-Oct-1945, 69 y.o.   MRN: [id number]   This very nice 69 y.o. DWF presents for 3 month follow up with Hypertension, Hyperlipidemia, Pre-Diabetes and Vitamin D Deficiency. Patient also has COPD with c/o DOE and denies any sputum pdn. She has been seen in the past by Dr Elsworth Soho. Recently on Oct 28, 2014 she has Kyphoplasty by Dr Lynann Bologna with apparent good results.     Patient is treated for HTN since 1998 & BP has been controlled at home. Today's BP: 136/82 mmHg. In 2004 she had a Negative Cardiolite. She has hx/o CVA in 2003 & 2003 without residual.  Patient has had no complaints of any cardiac type chest pain, palpitations, dyspnea/orthopnea/PND, dizziness, claudication, or dependent edema.   Hyperlipidemia is controlled with diet.  Last Lipids were at goal - Cholesterol 120; HDL 55; LDL 34; Triglycerides 155 on 08/05/2014.    Also, the patient has history of PreDiabetes and has had no symptoms of reactive hypoglycemia, diabetic polys, paresthesias or visual blurring.  Last A1c was  5.6% on 08/05/2014.    Further, the patient also has history of Vitamin D Deficiency and supplements vitamin D without any suspected side-effects. Last vitamin D was 75 on 04/29/2014.  Medication Sig  . b complex vitamins tablet Take 1 tablet by mouth daily.  Marland Kitchen VITAMIN D  Take 5,000 Units by mouth daily.  Marland Kitchen gabapentin  300 MG capsule Take 600 mg by mouth at bedtime.  . Iron-Vitamin C Take 1 tablet by mouth. Takes every other day  . LORazepam1 MG tablet TAKE 1/2 TO 1 TABLET BY MOUTH THREE TIMES DAILY AS NEEDED   . losartan  100 MG tablet Take 100 mg by mouth daily.  . Magnesium 500 MG TABS Take 500 mg by mouth daily.   . mirtazapine (REMERON) 45 MG tablet TAKE 1 TABLET BY MOUTH EVERY NIGHT AT BEDTIME  . montelukast  10 MG tablet TAKE 1 TABLET BY MOUTH ONCE A DAY  . oxybutynin (DITROPAN) 5 MG tablet TAKE 1/2 TO 1 TAB THREE TIMES DAILY FOR BLADDER CONTROL  . QUEtiapine400 MG tablet  Take 1 tab at bedtime.  . quinapril  40 MG tablet TAKE 1 TAB AT BEDTIME.  . traMADol (ULTRAM) 50 MG tablet TAKE ONE TAB THREE TIMES DAILY AS NEEDED FOR PAIN  . vitamin C  500 MG tablet Take 500 mg by mouth daily.  Marland Kitchen loperamide  2 MG tablet Take 2 mg by mouth as needed for diarrhea or loose stools  . MIRALAX   Take 17 g by mouth daily.  . QUEtiapine (SEROQUEL) 400 MG tablet TAKE 1 TABLET BY MOUTH EVERY NIGHT AT BEDTIME   Allergies  Allergen Reactions  . Amoxapine And Related Nausea And Vomiting  . Amoxicillin     itching  . Ciprocinonide [Fluocinolone]     Pt unsure  . Ciprofloxacin     Pt unsure  . Doxycycline Nausea And Vomiting  . Erythromycin Hives  . Sulfa Antibiotics Other (See Comments)    Unknown-reaction as a child  . Zofran [Ondansetron Hcl] Nausea And Vomiting   PMHx:   Past Medical History  Diagnosis Date  . Hypertension   . GERD (gastroesophageal reflux disease)   . Stroke     was on Plavix - not now  . Carotid artery aneurysm 03/04/2012  . DJD (degenerative joint disease)   . COPD (chronic obstructive pulmonary disease)   . Depression    Immunization History  Administered Date(s) Administered  . Influenza Whole 12/25/2011, 12/02/2012  . Influenza, High Dose Seasonal PF 12/14/2013, 12/15/2014  . Pneumococcal Conjugate-13 12/14/2013  . Pneumococcal-Unspecified 01/29/2007, 02/02/2013  . Td 03/27/2011  . Zoster 01/23/2006   Past Surgical History  Procedure Laterality Date  . Video bronchoscopy  04/24/2012    Procedure: VIDEO BRONCHOSCOPY WITHOUT FLUORO;  Surgeon: Rigoberto Noel, MD;  Location: [facility];  Service: Cardiopulmonary;  Laterality: Bilateral;  . Tubal ligation  30 years  . Tonsillectomy    . Colonoscopy  2004  . Hernia repair Right 2014    inguinal  . Cataract extraction Bilateral   . Microlaryngoscopy with co2 laser and excision of vocal cord lesion N/A 06/05/2013    Procedure: MICROLARYNGOSCOPY WITH CO2 LASER AND EXCISION OF VOCAL CORD  POLYPS;  Surgeon: Melida Quitter, MD;  Location: [facility] OR;  Service: ENT;  Laterality: N/A;  . Kyphoplasty N/A 10/28/2014    Procedure: KYPHOPLASTY;  Surgeon: Phylliss Bob, MD;  Location: [facility];  Service: Orthopedics;  Laterality: N/A;  Lumbar 1 kyphoplasty   FHx:    Reviewed / unchanged  SHx:    Reviewed / unchanged  Systems Review:  Constitutional: Denies fever, chills, wt changes, headaches, insomnia, fatigue, night sweats, change in appetite. Eyes: Denies redness, blurred vision, diplopia, discharge, itchy, watery eyes.  ENT: Denies discharge, congestion, post nasal drip, epistaxis, sore throat, earache, hearing loss, dental pain, tinnitus, vertigo, sinus pain, snoring.  CV: Denies chest pain, palpitations, irregular heartbeat, syncope, dyspnea, diaphoresis, orthopnea, PND, claudication or edema. Respiratory: denies cough, dyspnea, DOE, pleurisy, hoarseness, laryngitis, wheezing.  Gastrointestinal: Denies dysphagia, odynophagia, heartburn, reflux, water brash, abdominal pain or cramps, nausea, vomiting, bloating, diarrhea, constipation, hematemesis, melena, hematochezia  or hemorrhoids. Genitourinary: Denies dysuria, frequency, urgency, nocturia, hesitancy, discharge, hematuria or flank pain. Musculoskeletal: Denies arthralgias, myalgias, stiffness, jt. swelling, pain, limping or strain/sprain.  Skin: Denies pruritus, rash, hives, warts, acne, eczema or change in skin lesion(s). Neuro: No weakness, tremor, incoordination, spasms, paresthesia or pain. Psychiatric: Denies confusion, memory loss or sensory loss. Endo: Denies change in weight, skin or hair change.  Heme/Lymph: No excessive bleeding, bruising or enlarged lymph nodes.  Physical Exam  BP 136/82 mmHg  Pulse 72  Temp(Src) 97.3 F (36.3 C)  Resp 16  Ht 5\' 1"  (1.549 m)  Wt 108 lb 9.6 oz (49.261 kg)  BMI 20.53 kg/m2  Appears chronically ill, but in no distress. Speech hoarse.   Eyes: PERRLA, EOMs, conjunctiva no swelling or  erythema. Sinuses: No frontal/maxillary tenderness ENT/Mouth: EAC's clear, TM's nl w/o erythema, bulging. Nares clear w/o erythema, swelling, exudates. Oropharynx clear without erythema or exudates. Oral hygiene is good. Tongue normal, non obstructing. Hearing intact.  Neck: Supple. Thyroid nl. Car 2+/2+ without bruits, nodes or JVD. Chest: Respirations nl with BS clear & equal w/o rales, rhonchi, wheezing or stridor.  Cor: Heart sounds normal w/ regular rate and rhythm without sig. murmurs, gallops, clicks, or rubs. Peripheral pulses normal and equal  without edema.  Abdomen: Soft & bowel sounds normal. Non-tender w/o guarding, rebound, hernias, masses, or organomegaly.  Lymphatics: Unremarkable.  Musculoskeletal: Full ROM all peripheral extremities, joint stability, 5/5 strength, and normal gait.  Skin: Warm, dry without exposed rashes, lesions or ecchymosis apparent.  Neuro: Cranial nerves intact, reflexes equal bilaterally. Sensory-motor testing grossly intact. Tendon reflexes grossly intact.  Pysch: Alert & oriented x 3.  Insight and judgement nl & appropriate. No ideations.  Assessment and Plan:  1. Essential hypertension  - TSH  2.  Hyperlipidemia  - Lipid panel  3. Vitamin D deficiency  - Vit D  25 hydroxy   4. Prediabetes  - Hemoglobin A1c - Insulin, random  5. COPD GOLD III   6. CKD (chronic kidney disease) stage 3, GFR 30-59 ml/min   7. Body mass index (BMI) of 20.0-20.9   8. Medication management  - CBC with Differential/Platelet - BASIC METABOLIC PANEL WITH GFR - Hepatic function panel - Magnesium  9. Need for prophylactic vaccination and inoculation against influenza  - Flu vaccine HIGH DOSE PF (Fluzone High dose)   Recommended regular exercise, BP monitoring, weight control, and discussed med and SE's. Recommended labs to assess and monitor clinical status. Further disposition pending results of labs. Over 30 minutes of exam, counseling, chart review  was performed

## 2014-12-16 ENCOUNTER — Ambulatory Visit (INDEPENDENT_AMBULATORY_CARE_PROVIDER_SITE_OTHER): Payer: Medicare Other | Admitting: Podiatry

## 2014-12-16 ENCOUNTER — Encounter: Payer: Self-pay | Admitting: Podiatry

## 2014-12-16 DIAGNOSIS — Q828 Other specified congenital malformations of skin: Secondary | ICD-10-CM | POA: Diagnosis not present

## 2014-12-16 LAB — INSULIN, RANDOM: INSULIN: 4.5 u[IU]/mL (ref 2.0–19.6)

## 2014-12-16 LAB — LIPID PANEL
CHOLESTEROL: 133 mg/dL (ref 125–200)
HDL: 50 mg/dL (ref >=46)
LDL Cholesterol: 38 mg/dL (ref <130)
Total CHOL/HDL Ratio: 2.7 Ratio (ref <=5.0)
Triglycerides: 226 mg/dL — ABNORMAL HIGH (ref <150)
VLDL: 45 mg/dL — ABNORMAL HIGH (ref <30)

## 2014-12-16 LAB — TSH: TSH: 2.089 u[IU]/mL (ref 0.350–4.500)

## 2014-12-16 LAB — BASIC METABOLIC PANEL WITH GFR
BUN: 14 mg/dL (ref 7–25)
CALCIUM: 9.1 mg/dL (ref 8.6–10.4)
CHLORIDE: 102 mmol/L (ref 98–110)
CO2: 26 mmol/L (ref 20–31)
CREATININE: 1.07 mg/dL — AB (ref 0.60–0.93)
GFR, Est African American: 60 mL/min (ref >=60)
GFR, Est Non African American: 52 mL/min — ABNORMAL LOW (ref >=60)
Glucose, Bld: 81 mg/dL (ref 65–99)
Potassium: 5.2 mmol/L (ref 3.5–5.3)
Sodium: 135 mmol/L (ref 135–146)

## 2014-12-16 LAB — HEMOGLOBIN A1C
HEMOGLOBIN A1C: 5.6 % (ref <5.7)
Mean Plasma Glucose: 114 mg/dL (ref <117)

## 2014-12-16 LAB — VITAMIN D 25 HYDROXY (VIT D DEFICIENCY, FRACTURES): VIT D 25 HYDROXY: 77 ng/mL (ref 30–100)

## 2014-12-16 LAB — HEPATIC FUNCTION PANEL
ALBUMIN: 3.9 g/dL (ref 3.6–5.1)
ALT: 7 U/L (ref 6–29)
AST: 19 U/L (ref 10–35)
Alkaline Phosphatase: 64 U/L (ref 33–130)
BILIRUBIN TOTAL: 0.2 mg/dL (ref 0.2–1.2)
Bilirubin, Direct: 0.1 mg/dL (ref <=0.2)
Indirect Bilirubin: 0.1 mg/dL — ABNORMAL LOW (ref 0.2–1.2)
Total Protein: 6.3 g/dL (ref 6.1–8.1)

## 2014-12-16 LAB — MAGNESIUM: MAGNESIUM: 1.7 mg/dL (ref 1.5–2.5)

## 2014-12-16 NOTE — Progress Notes (Signed)
She presents today with chief complaint of a painful callus fifth digit right foot. Denies any trauma to the foot. Denies any changes in her past medical history medications or allergies.  Objective: Vital signs are stable alert and oriented 3. Pulses are strongly palpable bilateral. Neurologic sensorium is intact percent or C monofilament. Hammertoe deformity fifth right resulting in irritation and reactive hyperkeratosis fifth digit of the right foot.  Assessment: Or keratosis cord fifth digit right foot hammertoe deformity fifth digit right foot.  Plan: Debridement and reactive hyperkeratoses follow up with her in 3-4 weeks.

## 2014-12-23 DIAGNOSIS — M545 Low back pain: Secondary | ICD-10-CM | POA: Diagnosis not present

## 2014-12-28 DIAGNOSIS — M545 Low back pain: Secondary | ICD-10-CM | POA: Diagnosis not present

## 2014-12-30 DIAGNOSIS — M545 Low back pain: Secondary | ICD-10-CM | POA: Diagnosis not present

## 2015-01-04 ENCOUNTER — Encounter: Payer: Self-pay | Admitting: Podiatry

## 2015-01-04 ENCOUNTER — Ambulatory Visit (INDEPENDENT_AMBULATORY_CARE_PROVIDER_SITE_OTHER): Payer: Medicare Other | Admitting: Podiatry

## 2015-01-04 VITALS — BP 141/59 | HR 82 | Resp 16

## 2015-01-04 DIAGNOSIS — Q828 Other specified congenital malformations of skin: Secondary | ICD-10-CM | POA: Diagnosis not present

## 2015-01-04 DIAGNOSIS — M79674 Pain in right toe(s): Secondary | ICD-10-CM | POA: Diagnosis not present

## 2015-01-05 NOTE — Progress Notes (Signed)
She presents today with a chief complaint of a painful fifth digit of the right foot. She denies trauma and states that I really don't know what is wrong with it but it just hurts area  Objective: Vital signs are stable she is alert and oriented 3. Pulses are palpable. Fifth digit right foot demonstrates mild erythema with a distal clavus. No signs of infection.  Assessment: Hammertoe deformity fifth right with a distal clavus.  Plan: Debridement of reactive hyperkeratosis follow up with her as needed.  Roselind Messier DPM

## 2015-01-06 ENCOUNTER — Ambulatory Visit: Payer: Medicare Other | Admitting: Podiatry

## 2015-01-06 DIAGNOSIS — M545 Low back pain: Secondary | ICD-10-CM | POA: Diagnosis not present

## 2015-01-11 DIAGNOSIS — M545 Low back pain: Secondary | ICD-10-CM | POA: Diagnosis not present

## 2015-01-18 DIAGNOSIS — M545 Low back pain: Secondary | ICD-10-CM | POA: Diagnosis not present

## 2015-01-25 ENCOUNTER — Encounter: Payer: Self-pay | Admitting: Podiatry

## 2015-01-25 ENCOUNTER — Ambulatory Visit (INDEPENDENT_AMBULATORY_CARE_PROVIDER_SITE_OTHER): Payer: Medicare Other | Admitting: Podiatry

## 2015-01-25 DIAGNOSIS — M79676 Pain in unspecified toe(s): Secondary | ICD-10-CM

## 2015-01-25 DIAGNOSIS — Q828 Other specified congenital malformations of skin: Secondary | ICD-10-CM

## 2015-01-25 DIAGNOSIS — B351 Tinea unguium: Secondary | ICD-10-CM

## 2015-01-25 NOTE — Progress Notes (Signed)
She presents today with a chief complaint of painful elongated toenails as well as painful hyperkeratotic lesion fifth digit of the right foot.  Objective: Vital signs are stable she is alert and oriented 3 hammertoe deformities are noted. Pulses are strong and palpable. Her toenails are thick yellow dystrophic onychomycotic painful palpation.  Assessment: Pain limb secondary to onychomycosis of porokeratosis with hammertoe deformities.  Plan: Debridement of all reactive hyperkeratosis and debridement of toenails 1 through 5 bilateral. Follow up with her in 3 weeks at her request.  Roselind Messier DPM

## 2015-01-27 DIAGNOSIS — M545 Low back pain: Secondary | ICD-10-CM | POA: Diagnosis not present

## 2015-02-03 DIAGNOSIS — M545 Low back pain: Secondary | ICD-10-CM | POA: Diagnosis not present

## 2015-02-08 DIAGNOSIS — M545 Low back pain: Secondary | ICD-10-CM | POA: Diagnosis not present

## 2015-02-15 DIAGNOSIS — M545 Low back pain: Secondary | ICD-10-CM | POA: Diagnosis not present

## 2015-02-16 ENCOUNTER — Other Ambulatory Visit (HOSPITAL_COMMUNITY): Payer: Self-pay | Admitting: Family Medicine

## 2015-02-16 DIAGNOSIS — M79671 Pain in right foot: Secondary | ICD-10-CM | POA: Diagnosis not present

## 2015-02-16 DIAGNOSIS — S92901A Unspecified fracture of right foot, initial encounter for closed fracture: Secondary | ICD-10-CM

## 2015-02-21 ENCOUNTER — Ambulatory Visit (HOSPITAL_COMMUNITY): Admission: RE | Admit: 2015-02-21 | Payer: Medicare Other | Source: Ambulatory Visit

## 2015-02-21 ENCOUNTER — Ambulatory Visit (HOSPITAL_COMMUNITY)
Admission: RE | Admit: 2015-02-21 | Discharge: 2015-02-21 | Disposition: A | Discharge status: Home / Self Care | Payer: Medicare Other | Source: Ambulatory Visit | Attending: Family Medicine | Admitting: Family Medicine

## 2015-02-21 DIAGNOSIS — M810 Age-related osteoporosis without current pathological fracture: Secondary | ICD-10-CM | POA: Diagnosis not present

## 2015-02-21 DIAGNOSIS — S92901A Unspecified fracture of right foot, initial encounter for closed fracture: Secondary | ICD-10-CM | POA: Diagnosis present

## 2015-02-21 DIAGNOSIS — S92331A Displaced fracture of third metatarsal bone, right foot, initial encounter for closed fracture: Secondary | ICD-10-CM | POA: Insufficient documentation

## 2015-02-21 DIAGNOSIS — S92341A Displaced fracture of fourth metatarsal bone, right foot, initial encounter for closed fracture: Secondary | ICD-10-CM | POA: Insufficient documentation

## 2015-02-21 DIAGNOSIS — S92321A Displaced fracture of second metatarsal bone, right foot, initial encounter for closed fracture: Secondary | ICD-10-CM | POA: Insufficient documentation

## 2015-02-21 DIAGNOSIS — X58XXXA Exposure to other specified factors, initial encounter: Secondary | ICD-10-CM | POA: Diagnosis not present

## 2015-02-21 DIAGNOSIS — S92311A Displaced fracture of first metatarsal bone, right foot, initial encounter for closed fracture: Secondary | ICD-10-CM | POA: Insufficient documentation

## 2015-02-21 DIAGNOSIS — S92351A Displaced fracture of fifth metatarsal bone, right foot, initial encounter for closed fracture: Secondary | ICD-10-CM | POA: Insufficient documentation

## 2015-02-22 ENCOUNTER — Ambulatory Visit: Payer: Medicare Other | Admitting: Podiatry

## 2015-02-25 ENCOUNTER — Telehealth: Payer: Self-pay | Admitting: *Deleted
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
## 2015-02-28 DIAGNOSIS — S92301A Fracture of unspecified metatarsal bone(s), right foot, initial encounter for closed fracture: Secondary | ICD-10-CM | POA: Diagnosis not present

## 2015-02-28 NOTE — Telephone Encounter (Signed)
Set her up with an appointment.

## 2015-02-28 NOTE — Telephone Encounter (Signed)
Dr. Milinda Pointer ordered get pt into be seen.  I referred this message to schedulers to call pt for appt.

## 2015-03-01 ENCOUNTER — Encounter: Payer: Self-pay | Admitting: Podiatry

## 2015-03-01 ENCOUNTER — Ambulatory Visit (INDEPENDENT_AMBULATORY_CARE_PROVIDER_SITE_OTHER): Payer: Medicare Other | Admitting: Podiatry

## 2015-03-01 ENCOUNTER — Ambulatory Visit: Payer: Medicare Other

## 2015-03-01 DIAGNOSIS — S92901A Unspecified fracture of right foot, initial encounter for closed fracture: Secondary | ICD-10-CM

## 2015-03-01 DIAGNOSIS — B351 Tinea unguium: Secondary | ICD-10-CM

## 2015-03-01 DIAGNOSIS — Q828 Other specified congenital malformations of skin: Secondary | ICD-10-CM | POA: Diagnosis not present

## 2015-03-01 DIAGNOSIS — M79676 Pain in unspecified toe(s): Secondary | ICD-10-CM

## 2015-03-01 DIAGNOSIS — S99921A Unspecified injury of right foot, initial encounter: Secondary | ICD-10-CM
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
## 2015-03-08 ENCOUNTER — Ambulatory Visit (INDEPENDENT_AMBULATORY_CARE_PROVIDER_SITE_OTHER): Payer: Medicare Other | Admitting: Podiatry

## 2015-03-08 DIAGNOSIS — Q828 Other specified congenital malformations of skin: Secondary | ICD-10-CM
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
## 2015-03-22 ENCOUNTER — Encounter: Payer: Self-pay | Admitting: Podiatry

## 2015-03-22 ENCOUNTER — Ambulatory Visit (INDEPENDENT_AMBULATORY_CARE_PROVIDER_SITE_OTHER): Payer: Medicare Other | Admitting: Podiatry

## 2015-03-22 DIAGNOSIS — Q828 Other specified congenital malformations of skin: Secondary | ICD-10-CM | POA: Diagnosis not present

## 2015-03-23 NOTE — Progress Notes (Signed)
She presents today for a follow-up regarding her painful elongated toenails and corns and calluses.  Objective: Vital signs are stable she is alert and oriented 3 pulses are palpable. She has pain and limp secondary to thick yellow dystrophic mycotic nails as well as calluses plantar aspect of the fifth digit right foot.  Assessment: Pain limiting her onychomycosis and porokeratosis.  Plan: Debridement of all reactive hyperkeratoses. She would like for [place] to start taking care of first fractured right foot. I expressed to her that she would have to explain that to her orthopedic doctor so that we are both not treating her. I will follow-up with her in the near future.

## 2015-03-24 ENCOUNTER — Ambulatory Visit: Payer: Self-pay | Admitting: Physician Assistant

## 2015-03-30 ENCOUNTER — Ambulatory Visit (INDEPENDENT_AMBULATORY_CARE_PROVIDER_SITE_OTHER): Payer: Commercial Managed Care - HMO | Admitting: Physician Assistant

## 2015-03-30 ENCOUNTER — Encounter: Payer: Self-pay | Admitting: Physician Assistant

## 2015-03-30 VITALS — BP 140/78 | HR 94 | Temp 97.7°F | Resp 14 | Ht 61.0 in | Wt 108.0 lb

## 2015-03-30 DIAGNOSIS — R7303 Prediabetes: Secondary | ICD-10-CM | POA: Diagnosis not present

## 2015-03-30 DIAGNOSIS — J383 Other diseases of vocal cords: Secondary | ICD-10-CM

## 2015-03-30 DIAGNOSIS — E559 Vitamin D deficiency, unspecified: Secondary | ICD-10-CM | POA: Diagnosis not present

## 2015-03-30 DIAGNOSIS — Z0000 Encounter for general adult medical examination without abnormal findings: Secondary | ICD-10-CM

## 2015-03-30 DIAGNOSIS — Z0001 Encounter for general adult medical examination with abnormal findings: Secondary | ICD-10-CM

## 2015-03-30 DIAGNOSIS — R4189 Other symptoms and signs involving cognitive functions and awareness: Secondary | ICD-10-CM | POA: Diagnosis not present

## 2015-03-30 DIAGNOSIS — N183 Chronic kidney disease, stage 3 unspecified: Secondary | ICD-10-CM

## 2015-03-30 DIAGNOSIS — I720 Aneurysm of carotid artery: Secondary | ICD-10-CM

## 2015-03-30 DIAGNOSIS — Z79899 Other long term (current) drug therapy: Secondary | ICD-10-CM

## 2015-03-30 DIAGNOSIS — J449 Chronic obstructive pulmonary disease, unspecified: Secondary | ICD-10-CM

## 2015-03-30 DIAGNOSIS — F17200 Nicotine dependence, unspecified, uncomplicated: Secondary | ICD-10-CM

## 2015-03-30 DIAGNOSIS — Z720 Tobacco use: Secondary | ICD-10-CM | POA: Diagnosis not present

## 2015-03-30 DIAGNOSIS — I10 Essential (primary) hypertension: Secondary | ICD-10-CM

## 2015-03-30 DIAGNOSIS — F325 Major depressive disorder, single episode, in full remission: Secondary | ICD-10-CM

## 2015-03-30 DIAGNOSIS — E782 Mixed hyperlipidemia: Secondary | ICD-10-CM | POA: Diagnosis not present

## 2015-03-30 DIAGNOSIS — D638 Anemia in other chronic diseases classified elsewhere: Secondary | ICD-10-CM

## 2015-03-30 DIAGNOSIS — R6889 Other general symptoms and signs: Secondary | ICD-10-CM

## 2015-03-30 DIAGNOSIS — E2839 Other primary ovarian failure: Secondary | ICD-10-CM
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
## 2015-03-30 NOTE — Patient Instructions (Addendum)
Preventive Care for Adults A healthy lifestyle and preventive care can promote health and wellness. Preventive health guidelines for women include the following key practices.  A routine yearly physical is a good way to check with your health care provider about your health and preventive screening. It is a chance to share any concerns and updates on your health and to receive a thorough exam.  Visit your dentist for a routine exam and preventive care every 6 months. Brush your teeth twice a day and floss once a day. Good oral hygiene prevents tooth decay and gum disease.  The frequency of eye exams is based on your age, health, family medical history, use of contact lenses, and other factors. Follow your health care provider's recommendations for frequency of eye exams.  Eat a healthy diet. Foods like vegetables, fruits, whole grains, low-fat dairy products, and lean protein foods contain the nutrients you need without too many calories. Decrease your intake of foods high in solid fats, added sugars, and salt. Eat the right amount of calories for you.Get information about a proper diet from your health care provider, if necessary.  Regular physical exercise is one of the most important things you can do for your health. Most adults should get at least 150 minutes of moderate-intensity exercise (any activity that increases your heart rate and causes you to sweat) each week. In addition, most adults need muscle-strengthening exercises on 2 or more days a week.  Maintain a healthy weight. The body mass index (BMI) is a screening tool to identify possible weight problems. It provides an estimate of body fat based on height and weight. Your health care provider can find your BMI and can help you achieve or maintain a healthy weight.For adults 20 years and older:  A BMI below 18.5 is considered underweight.  A BMI of 18.5 to 24.9 is normal.  A BMI of 25 to 29.9 is considered overweight.  A BMI of  30 and above is considered obese.  Maintain normal blood lipids and cholesterol levels by exercising and minimizing your intake of saturated fat. Eat a balanced diet with plenty of fruit and vegetables. If your lipid or cholesterol levels are high, you are over 50, or you are at high risk for heart disease, you may need your cholesterol levels checked more frequently.Ongoing high lipid and cholesterol levels should be treated with medicines if diet and exercise are not working.  If you smoke, find out from your health care provider how to quit. If you do not use tobacco, do not start.  Lung cancer screening is recommended for adults aged 12-80 years who are at high risk for developing lung cancer because of a history of smoking. A yearly low-dose CT scan of the lungs is recommended for people who have at least a 30-pack-year history of smoking and are a current smoker or have quit within the past 15 years. A pack year of smoking is smoking an average of 1 pack of cigarettes a day for 1 year (for example: 1 pack a day for 30 years or 2 packs a day for 15 years). Yearly screening should continue until the smoker has stopped smoking for at least 15 years. Yearly screening should be stopped for people who develop a health problem that would prevent them from having lung cancer treatment.  Avoid use of street drugs. Do not share needles with anyone. Ask for help if you need support or instructions about stopping the use of drugs.  High blood  pressure causes heart disease and increases the risk of stroke.  Ongoing high blood pressure should be treated with medicines if weight loss and exercise do not work.  If you are 60-38 years old, ask your health care provider if you should take aspirin to prevent strokes.  Diabetes screening involves taking a blood sample to check your fasting blood sugar level. This should be done once every 3 years, after age 3, if you are within normal weight and without risk  factors for diabetes. Testing should be considered at a younger age or be carried out more frequently if you are overweight and have at least 1 risk factor for diabetes.  Breast cancer screening is essential preventive care for women. You should practice "breast self-awareness." This means understanding the normal appearance and feel of your breasts and may include breast self-examination. Any changes detected, no matter how small, should be reported to a health care provider. Women in their 51s and 30s should have a clinical breast exam (CBE) by a health care provider as part of a regular health exam every 1 to 3 years. After age 65, women should have a CBE every year. Starting at age 52, women should consider having a mammogram (breast X-ray test) every year. Women who have a family history of breast cancer should talk to their health care provider about genetic screening. Women at a high risk of breast cancer should talk to their health care providers about having an MRI and a mammogram every year.  Breast cancer gene (BRCA)-related cancer risk assessment is recommended for women who have family members with BRCA-related cancers. BRCA-related cancers include breast, ovarian, tubal, and peritoneal cancers. Having family members with these cancers may be associated with an increased risk for harmful changes (mutations) in the breast cancer genes BRCA1 and BRCA2. Results of the assessment will determine the need for genetic counseling and BRCA1 and BRCA2 testing.  Routine pelvic exams to screen for cancer are no longer recommended for nonpregnant women who are considered low risk for cancer of the pelvic organs (ovaries, uterus, and vagina) and who do not have symptoms. Ask your health care provider if a screening pelvic exam is right for you.  If you have had past treatment for cervical cancer or a condition that could lead to cancer, you need Pap tests and screening for cancer for at least 20 years after  your treatment. If Pap tests have been discontinued, your risk factors (such as having a new sexual partner) need to be reassessed to determine if screening should be resumed. Some women have medical problems that increase the chance of getting cervical cancer. In these cases, your health care provider may recommend more frequent screening and Pap tests.    Colorectal cancer can be detected and often prevented. Most routine colorectal cancer screening begins at the age of 46 years and continues through age 57 years. However, your health care provider may recommend screening at an earlier age if you have risk factors for colon cancer. On a yearly basis, your health care provider may provide home test kits to check for hidden blood in the stool. Use of a small camera at the end of a tube, to directly examine the colon (sigmoidoscopy or colonoscopy), can detect the earliest forms of colorectal cancer. Talk to your health care provider about this at age 15, when routine screening begins. Direct exam of the colon should be repeated every 5-10 years through age 36 years, unless early forms of pre-cancerous polyps  or small growths are found.  Osteoporosis is a disease in which the bones lose minerals and strength with aging. This can result in serious bone fractures or breaks. The risk of osteoporosis can be identified using a bone density scan. Women ages 62 years and over and women at risk for fractures or osteoporosis should discuss screening with their health care providers. Ask your health care provider whether you should take a calcium supplement or vitamin D to reduce the rate of osteoporosis.  Menopause can be associated with physical symptoms and risks. Hormone replacement therapy is available to decrease symptoms and risks. You should talk to your health care provider about whether hormone replacement therapy is right for you.  Use sunscreen. Apply sunscreen liberally and repeatedly throughout the day.  You should seek shade when your shadow is shorter than you. Protect yourself by wearing long sleeves, pants, a wide-brimmed hat, and sunglasses year round, whenever you are outdoors.  Once a month, do a whole body skin exam, using a mirror to look at the skin on your back. Tell your health care provider of new moles, moles that have irregular borders, moles that are larger than a pencil eraser, or moles that have changed in shape or color.  Stay current with required vaccines (immunizations).  Influenza vaccine. All adults should be immunized every year.  Tetanus, diphtheria, and acellular pertussis (Td, Tdap) vaccine. Pregnant women should receive 1 dose of Tdap vaccine during each pregnancy. The dose should be obtained regardless of the length of time since the last dose. Immunization is preferred during the 27th-36th week of gestation. An adult who has not previously received Tdap or who does not know her vaccine status should receive 1 dose of Tdap. This initial dose should be followed by tetanus and diphtheria toxoids (Td) booster doses every 10 years. Adults with an unknown or incomplete history of completing a 3-dose immunization series with Td-containing vaccines should begin or complete a primary immunization series including a Tdap dose. Adults should receive a Td booster every 10 years.    Zoster vaccine. One dose is recommended for adults aged 4 years or older unless certain conditions are present.    Pneumococcal 13-valent conjugate (PCV13) vaccine. When indicated, a person who is uncertain of her immunization history and has no record of immunization should receive the PCV13 vaccine. An adult aged 35 years or older who has certain medical conditions and has not been previously immunized should receive 1 dose of PCV13 vaccine. This PCV13 should be followed with a dose of pneumococcal polysaccharide (PPSV23) vaccine. The PPSV23 vaccine dose should be obtained at least 8 weeks after the  dose of PCV13 vaccine. An adult aged 85 years or older who has certain medical conditions and previously received 1 or more doses of PPSV23 vaccine should receive 1 dose of PCV13. The PCV13 vaccine dose should be obtained 1 or more years after the last PPSV23 vaccine dose.    Pneumococcal polysaccharide (PPSV23) vaccine. When PCV13 is also indicated, PCV13 should be obtained first. All adults aged 15 years and older should be immunized. An adult younger than age 38 years who has certain medical conditions should be immunized. Any person who resides in a nursing home or long-term care facility should be immunized. An adult smoker should be immunized. People with an immunocompromised condition and certain other conditions should receive both PCV13 and PPSV23 vaccines. People with human immunodeficiency virus (HIV) infection should be immunized as soon as possible after diagnosis. Immunization during  chemotherapy or radiation therapy should be avoided. Routine use of PPSV23 vaccine is not recommended for American Indians, [place], or people younger than 65 years unless there are medical conditions that require PPSV23 vaccine. When indicated, people who have unknown immunization and have no record of immunization should receive PPSV23 vaccine. One-time revaccination 5 years after the first dose of PPSV23 is recommended for people aged 19-64 years who have chronic kidney failure, nephrotic syndrome, asplenia, or immunocompromised conditions. People who received 1-2 doses of PPSV23 before age 68 years should receive another dose of PPSV23 vaccine at age 34 years or later if at least 5 years have passed since the previous dose. Doses of PPSV23 are not needed for people immunized with PPSV23 at or after age 17 years.   Preventive Services / Frequency  Ages 75 years and over  Blood pressure check.  Lipid and cholesterol check.  Lung cancer screening. / Every year if you are aged 7-80 years and have a  30-pack-year history of smoking and currently smoke or have quit within the past 15 years. Yearly screening is stopped once you have quit smoking for at least 15 years or develop a health problem that would prevent you from having lung cancer treatment.  Clinical breast exam.** / Every year after age 58 years.  BRCA-related cancer risk assessment.** / For women who have family members with a BRCA-related cancer (breast, ovarian, tubal, or peritoneal cancers).  Mammogram.** / Every year beginning at age 71 years and continuing for as long as you are in good health. Consult with your health care provider.  Pap test.** / Every 3 years starting at age 30 years through age 54 or 60 years with 3 consecutive normal Pap tests. Testing can be stopped between 65 and 70 years with 3 consecutive normal Pap tests and no abnormal Pap or HPV tests in the past 10 years.  Fecal occult blood test (FOBT) of stool. / Every year beginning at age 75 years and continuing until age 13 years. You may not need to do this test if you get a colonoscopy every 10 years.  Flexible sigmoidoscopy or colonoscopy.** / Every 5 years for a flexible sigmoidoscopy or every 10 years for a colonoscopy beginning at age 51 years and continuing until age 34 years.  Hepatitis C blood test.** / For all people born from [birth year] through 1967 and any individual with known risks for hepatitis C.  Osteoporosis screening.** / A one-time screening for women ages 68 years and over and women at risk for fractures or osteoporosis.  Skin self-exam. / Monthly.  Influenza vaccine. / Every year.  Tetanus, diphtheria, and acellular pertussis (Tdap/Td) vaccine.** / 1 dose of Td every 10 years.  Zoster vaccine.** / 1 dose for adults aged 70 years or older.  Pneumococcal 13-valent conjugate (PCV13) vaccine.** / Consult your health care provider.  Pneumococcal polysaccharide (PPSV23) vaccine.** / 1 dose for all adults aged 58 years and older. Screening  for abdominal aortic aneurysm (AAA)  by ultrasound is recommended for people who have history of high blood pressure or who are current or former smokers.

## 2015-03-31 LAB — BASIC METABOLIC PANEL WITH GFR
BUN: 17 mg/dL (ref 7–25)
CALCIUM: 8.9 mg/dL (ref 8.6–10.4)
CO2: 23 mmol/L (ref 20–31)
Chloride: 101 mmol/L (ref 98–110)
Creat: 1.43 mg/dL — ABNORMAL HIGH (ref 0.60–0.93)
GFR, EST AFRICAN AMERICAN: 43 mL/min — AB (ref >=60)
GFR, Est Non African American: 37 mL/min — ABNORMAL LOW (ref >=60)
Glucose, Bld: 73 mg/dL (ref 65–99)
Potassium: 4.8 mmol/L (ref 3.5–5.3)
SODIUM: 134 mmol/L — AB (ref 135–146)

## 2015-03-31 LAB — MAGNESIUM: Magnesium: 1.8 mg/dL (ref 1.5–2.5)

## 2015-03-31 LAB — CBC WITH DIFFERENTIAL/PLATELET
BASOS ABS: 0.1 10*3/uL (ref 0.0–0.1)
Basophils Relative: 1 % (ref 0–1)
Eosinophils Absolute: 0.7 10*3/uL (ref 0.0–0.7)
Eosinophils Relative: 8 % — ABNORMAL HIGH (ref 0–5)
HCT: 32 % — ABNORMAL LOW (ref 36.0–46.0)
Hemoglobin: 10.9 g/dL — ABNORMAL LOW (ref 12.0–15.0)
LYMPHS ABS: 2.9 10*3/uL (ref 0.7–4.0)
Lymphocytes Relative: 32 % (ref 12–46)
MCH: 32 pg (ref 26.0–34.0)
MCHC: 34.1 g/dL (ref 30.0–36.0)
MCV: 93.8 fL (ref 78.0–100.0)
MPV: 8.9 fL (ref 8.6–12.4)
Monocytes Absolute: 1.2 10*3/uL — ABNORMAL HIGH (ref 0.1–1.0)
Monocytes Relative: 13 % — ABNORMAL HIGH (ref 3–12)
NEUTROS PCT: 46 % (ref 43–77)
Neutro Abs: 4.2 10*3/uL (ref 1.7–7.7)
Platelets: 344 10*3/uL (ref 150–400)
RBC: 3.41 MIL/uL — ABNORMAL LOW (ref 3.87–5.11)
RDW: 14.1 % (ref 11.5–15.5)
WBC: 9.1 10*3/uL (ref 4.0–10.5)

## 2015-03-31 LAB — VITAMIN D 25 HYDROXY (VIT D DEFICIENCY, FRACTURES): Vit D, 25-Hydroxy: 111 ng/mL — ABNORMAL HIGH (ref 30–100)

## 2015-03-31 LAB — HEPATIC FUNCTION PANEL
ALT: 6 U/L (ref 6–29)
AST: 16 U/L (ref 10–35)
Albumin: 3.9 g/dL (ref 3.6–5.1)
Alkaline Phosphatase: 62 U/L (ref 33–130)
BILIRUBIN DIRECT: 0.1 mg/dL (ref <=0.2)
Indirect Bilirubin: 0.2 mg/dL (ref 0.2–1.2)
TOTAL PROTEIN: 6.1 g/dL (ref 6.1–8.1)
Total Bilirubin: 0.3 mg/dL (ref 0.2–1.2)

## 2015-03-31 LAB — LIPID PANEL
CHOL/HDL RATIO: 1.8 ratio (ref <=5.0)
Cholesterol: 118 mg/dL — ABNORMAL LOW (ref 125–200)
HDL: 66 mg/dL (ref >=46)
LDL Cholesterol: 31 mg/dL (ref <130)
TRIGLYCERIDES: 103 mg/dL (ref <150)
VLDL: 21 mg/dL (ref <30)

## 2015-03-31 LAB — TSH: TSH: 2.668 u[IU]/mL (ref 0.350–4.500)

## 2015-04-07 ENCOUNTER — Ambulatory Visit (HOSPITAL_COMMUNITY)
Admission: RE | Admit: 2015-04-07 | Discharge: 2015-04-07 | Disposition: A | Discharge status: Home / Self Care | Payer: Commercial Managed Care - HMO | Source: Ambulatory Visit | Attending: Physician Assistant | Admitting: Physician Assistant

## 2015-04-07 DIAGNOSIS — Z720 Tobacco use: Secondary | ICD-10-CM | POA: Diagnosis not present

## 2015-04-07 DIAGNOSIS — L570 Actinic keratosis: Secondary | ICD-10-CM | POA: Diagnosis not present

## 2015-04-07 DIAGNOSIS — F17200 Nicotine dependence, unspecified, uncomplicated: Secondary | ICD-10-CM

## 2015-04-07 DIAGNOSIS — I6523 Occlusion and stenosis of bilateral carotid arteries: Secondary | ICD-10-CM | POA: Insufficient documentation

## 2015-04-07 DIAGNOSIS — I720 Aneurysm of carotid artery: Secondary | ICD-10-CM | POA: Diagnosis not present

## 2015-04-07 DIAGNOSIS — L308 Other specified dermatitis: Secondary | ICD-10-CM | POA: Diagnosis not present

## 2015-04-07 DIAGNOSIS — Z85828 Personal history of other malignant neoplasm of skin: Secondary | ICD-10-CM | POA: Diagnosis not present

## 2015-04-07 NOTE — Progress Notes (Signed)
Preliminary results by tech - Carotid Duplex Completed. Bilateral 1-39% stenosis in the internal carotid arteries. Vertebral arteries demonstrate antegrade flow.  Oda Cogan, BS, RDMS, RVT

## 2015-04-12 ENCOUNTER — Encounter: Payer: Self-pay | Admitting: Podiatry

## 2015-04-12 ENCOUNTER — Ambulatory Visit (INDEPENDENT_AMBULATORY_CARE_PROVIDER_SITE_OTHER): Payer: Commercial Managed Care - HMO

## 2015-04-12 ENCOUNTER — Ambulatory Visit (INDEPENDENT_AMBULATORY_CARE_PROVIDER_SITE_OTHER): Payer: Commercial Managed Care - HMO | Admitting: Podiatry

## 2015-04-12 DIAGNOSIS — B351 Tinea unguium: Secondary | ICD-10-CM

## 2015-04-12 DIAGNOSIS — S92301D Fracture of unspecified metatarsal bone(s), right foot, subsequent encounter for fracture with routine healing: Secondary | ICD-10-CM | POA: Diagnosis not present

## 2015-04-12 DIAGNOSIS — M79676 Pain in unspecified toe(s): Secondary | ICD-10-CM | POA: Diagnosis not present

## 2015-04-12 DIAGNOSIS — Q828 Other specified congenital malformations of skin: Secondary | ICD-10-CM

## 2015-04-12 NOTE — Progress Notes (Signed)
She presents today with a Cam Walker to the right lower extremity after having broken her right foot. She is being treated by an orthopedic doctor and she states that she will no longer be seeing him and that she was to see [place]. She is also complaining of painful elongated toenails and corns and calluses.  Objective: Vital signs are stable she is alert and oriented 3. Right foot remains swollen. Forefoot is slightly abducted on the rear foot. In the toes are in plantar flexion. Radiographic evaluation does demonstrate fractured metatarsals 1 through 5 of the right foot with medial deviation of all of the lesser metatarsals. It appears that the majority of the metatarsals have healed with exception of the first metatarsal right. She has moderate to severe osteoarthritic changes either from previous injuries or from this injury. Her toenails are thick yellow dystrophic with mycotic multiple porokeratosis to the plantar aspects of the hallux and lesser digits bilateral.  Assessment: Fractured metatarsals right foot. Slowly healing. Pain in limb secondary to onychomycosis and porokeratosis.  Plan: I encouraged her to continue the use of the Cam Walker right foot. I also encouraged her to c continue range of motion exercises for her toes. I debrided the nails and porokeratotic lesions bilaterally. Follow up with her in 3 weeks for another set of x-rays.

## 2015-04-20 ENCOUNTER — Other Ambulatory Visit: Payer: Self-pay | Admitting: Internal Medicine

## 2015-04-21 ENCOUNTER — Telehealth: Payer: Self-pay | Admitting: *Deleted

## 2015-04-21 NOTE — Telephone Encounter (Signed)
Patient called and states she is having severe reflux and can not sleep due to the discomfort.  Patient was advised she can take her Ranitidine 300 mg 2 times a day until the reflux is resolved.  The patient has not been taking the med and will resume today.

## 2015-04-25 ENCOUNTER — Telehealth: Payer: Self-pay | Admitting: Internal Medicine

## 2015-04-25 NOTE — Telephone Encounter (Signed)
Starlene left voicemail with the names of facilities she follows: [facility]  Called patient back to determine why she gave me the list, no voicemail available.  Thank you, Leonie Douglas Referral Coordinator  [facility] [phone] ext. [phone] Fax [phone]

## 2015-04-26 ENCOUNTER — Ambulatory Visit: Payer: Commercial Managed Care - HMO | Admitting: Podiatry

## 2015-05-03 ENCOUNTER — Ambulatory Visit: Payer: Commercial Managed Care - HMO | Admitting: Podiatry

## 2015-05-04 ENCOUNTER — Inpatient Hospital Stay: Admission: RE | Admit: 2015-05-04 | Payer: Commercial Managed Care - HMO | Source: Ambulatory Visit

## 2015-05-06 ENCOUNTER — Other Ambulatory Visit: Payer: Self-pay | Admitting: *Deleted

## 2015-05-06 MED ORDER — RANITIDINE HCL 300 MG PO TABS: 300.0000 mg | ORAL_TABLET | Freq: Two times a day (BID) | ORAL | Status: DC

## 2015-05-10 ENCOUNTER — Ambulatory Visit (INDEPENDENT_AMBULATORY_CARE_PROVIDER_SITE_OTHER): Payer: Commercial Managed Care - HMO

## 2015-05-10 ENCOUNTER — Encounter: Payer: Self-pay | Admitting: Podiatry

## 2015-05-10 ENCOUNTER — Ambulatory Visit (INDEPENDENT_AMBULATORY_CARE_PROVIDER_SITE_OTHER): Payer: Commercial Managed Care - HMO | Admitting: Podiatry

## 2015-05-10 DIAGNOSIS — S92301D Fracture of unspecified metatarsal bone(s), right foot, subsequent encounter for fracture with routine healing: Secondary | ICD-10-CM

## 2015-05-10 DIAGNOSIS — B351 Tinea unguium: Secondary | ICD-10-CM | POA: Diagnosis not present

## 2015-05-10 DIAGNOSIS — M79676 Pain in unspecified toe(s): Secondary | ICD-10-CM

## 2015-05-10 NOTE — Progress Notes (Signed)
She presents today with a chief complaint of painful elongated toenails as well as poor keratoses bilaterally. She continues to ambulate in her Cam Walker to the right lower extremity after fracture of her midfoot. She states that this seems to be doing much better.  Objective: Vital signs are stable she is alert and oriented 3. Pulses are palpable bilateral. She still has some edema to the right lower extremity but the majority of her symptoms have receded with palpation to the forefoot and midfoot of the right side. Her toenails are elongated with multiple porokeratosis bilaterally.  Assessment: Well-healing fractures right foot. Pain in limb secondary to onychomycosis and porokeratosis.  Plan: I placed her in a compression anklet and debrided her nails and calluses. I instructed her to get back into her regular syndrome issues and I will follow-up with her in 3-4 weeks.

## 2015-05-15 ENCOUNTER — Other Ambulatory Visit: Payer: Self-pay | Admitting: Internal Medicine

## 2015-05-18 ENCOUNTER — Other Ambulatory Visit: Payer: Self-pay | Admitting: Internal Medicine

## 2015-05-19 ENCOUNTER — Encounter: Payer: Self-pay | Admitting: Internal Medicine

## 2015-05-24 ENCOUNTER — Other Ambulatory Visit: Payer: Self-pay

## 2015-05-24 DIAGNOSIS — Z1231 Encounter for screening mammogram for malignant neoplasm of breast: Secondary | ICD-10-CM

## 2015-05-31 ENCOUNTER — Other Ambulatory Visit: Payer: Self-pay | Admitting: Internal Medicine

## 2015-06-06 ENCOUNTER — Other Ambulatory Visit: Payer: Self-pay | Admitting: Internal Medicine

## 2015-06-06 ENCOUNTER — Other Ambulatory Visit: Payer: Self-pay | Admitting: *Deleted

## 2015-06-06 MED ORDER — TRIAMCINOLONE ACETONIDE 0.1 % EX CREA: 1.0000 "application " | TOPICAL_CREAM | Freq: Two times a day (BID) | CUTANEOUS | Status: DC

## 2015-06-07 ENCOUNTER — Ambulatory Visit (INDEPENDENT_AMBULATORY_CARE_PROVIDER_SITE_OTHER): Payer: Commercial Managed Care - HMO

## 2015-06-07 ENCOUNTER — Ambulatory Visit (INDEPENDENT_AMBULATORY_CARE_PROVIDER_SITE_OTHER): Payer: Commercial Managed Care - HMO | Admitting: Podiatry

## 2015-06-07 ENCOUNTER — Encounter: Payer: Self-pay | Admitting: Podiatry

## 2015-06-07 VITALS — BP 133/84 | HR 97 | Resp 16

## 2015-06-07 DIAGNOSIS — S92301D Fracture of unspecified metatarsal bone(s), right foot, subsequent encounter for fracture with routine healing: Secondary | ICD-10-CM | POA: Diagnosis not present

## 2015-06-07 DIAGNOSIS — Q828 Other specified congenital malformations of skin: Secondary | ICD-10-CM | POA: Diagnosis not present

## 2015-06-07 NOTE — Progress Notes (Signed)
She presents today for follow-up of fracture to her right foot. She states that she seems to be doing much better however she does notice some pain and at times she continues to wear her boot. She is also having pain beneath the second digit of the left foot.  Objective: Vital signs are stable she is alert and oriented 3. Pulses are palpable. Neurologic sensorium is intact. Right foot does demonstrate an elevated first metatarsal with limited range of motion of all of her digits. Radiographs confirm 100% fusion and healing of all of her fractured lesser metatarsals including her first metatarsal. She doesn't straighten elevated first metatarsal. Second digit of the left foot does demonstrate a reactive hyperkeratosis beneath the level of the IP joint and the PIPJ.  Assessment: Well-healing fracture right foot. Porokeratosis second digit left foot.  Plan: I encouraged her to get back into her regular shoe gear for her right foot. Expressing to her that it is okay to wear the Cam Walker on occasion. I also debrided the area of reactive hyperkeratosis and I will follow-up with her in 3 weeks.

## 2015-06-13 ENCOUNTER — Other Ambulatory Visit: Payer: Medicare PPO

## 2015-06-13 ENCOUNTER — Ambulatory Visit: Payer: Medicare PPO

## 2015-06-21 ENCOUNTER — Ambulatory Visit (INDEPENDENT_AMBULATORY_CARE_PROVIDER_SITE_OTHER): Payer: Commercial Managed Care - HMO

## 2015-06-21 ENCOUNTER — Encounter: Payer: Self-pay | Admitting: Podiatry

## 2015-06-21 ENCOUNTER — Ambulatory Visit (INDEPENDENT_AMBULATORY_CARE_PROVIDER_SITE_OTHER): Payer: Commercial Managed Care - HMO | Admitting: Podiatry

## 2015-06-21 VITALS — BP 142/91 | HR 102 | Resp 18

## 2015-06-21 DIAGNOSIS — S92301D Fracture of unspecified metatarsal bone(s), right foot, subsequent encounter for fracture with routine healing: Secondary | ICD-10-CM

## 2015-06-21 NOTE — Progress Notes (Signed)
She presents today concerned of swelling to the right lower extremity. She has a history of fracture of all of her metatarsals at the bases. She has had a medial shift with dorsiflexion of the first metatarsal which is resulted and metatarsus adductus and she's concerned that her foot turns in.  Objective: Vital signs are stable she is alert and oriented 3 pulses are palpable. No open lesions or wounds. There is minimal swelling to the dorsal lateral aspect of the right foot with metatarsus adductus and inversion of the foot. This is secondary to her fracture there was treated closed by orthopedics. Radiographs taken today demonstrate well-healed fracture sites.  Assessment well-healing fractures with mild edema.  Plan: I encouraged her to wear her compression anklet and we will follow-up with her in 3 weeks.

## 2015-06-28 ENCOUNTER — Ambulatory Visit: Payer: Commercial Managed Care - HMO | Admitting: Podiatry

## 2015-06-29 ENCOUNTER — Encounter: Payer: Self-pay | Admitting: Internal Medicine

## 2015-07-04 ENCOUNTER — Ambulatory Visit
Admission: RE | Admit: 2015-07-04 | Discharge: 2015-07-04 | Disposition: A | Discharge status: Home / Self Care | Payer: Commercial Managed Care - HMO | Source: Ambulatory Visit

## 2015-07-04 ENCOUNTER — Ambulatory Visit
Admission: RE | Admit: 2015-07-04 | Discharge: 2015-07-04 | Disposition: A | Discharge status: Home / Self Care | Payer: Commercial Managed Care - HMO | Source: Ambulatory Visit | Attending: Physician Assistant | Admitting: Physician Assistant

## 2015-07-04 DIAGNOSIS — Z780 Asymptomatic menopausal state: Secondary | ICD-10-CM | POA: Diagnosis not present

## 2015-07-04 DIAGNOSIS — Z1231 Encounter for screening mammogram for malignant neoplasm of breast: Secondary | ICD-10-CM

## 2015-07-04 DIAGNOSIS — M810 Age-related osteoporosis without current pathological fracture: Secondary | ICD-10-CM | POA: Diagnosis not present

## 2015-07-04 DIAGNOSIS — E2839 Other primary ovarian failure: Secondary | ICD-10-CM

## 2015-07-07 ENCOUNTER — Ambulatory Visit (INDEPENDENT_AMBULATORY_CARE_PROVIDER_SITE_OTHER): Payer: Commercial Managed Care - HMO | Admitting: Internal Medicine

## 2015-07-07 ENCOUNTER — Encounter: Payer: Self-pay | Admitting: Internal Medicine

## 2015-07-07 VITALS — BP 140/76 | HR 88 | Temp 97.9°F | Resp 16 | Ht 60.0 in | Wt 110.0 lb

## 2015-07-07 DIAGNOSIS — R7309 Other abnormal glucose: Secondary | ICD-10-CM | POA: Diagnosis not present

## 2015-07-07 DIAGNOSIS — J449 Chronic obstructive pulmonary disease, unspecified: Secondary | ICD-10-CM

## 2015-07-07 DIAGNOSIS — R6889 Other general symptoms and signs: Secondary | ICD-10-CM

## 2015-07-07 DIAGNOSIS — E559 Vitamin D deficiency, unspecified: Secondary | ICD-10-CM | POA: Diagnosis not present

## 2015-07-07 DIAGNOSIS — I10 Essential (primary) hypertension: Secondary | ICD-10-CM | POA: Diagnosis not present

## 2015-07-07 DIAGNOSIS — Z1212 Encounter for screening for malignant neoplasm of rectum: Secondary | ICD-10-CM

## 2015-07-07 DIAGNOSIS — Z0001 Encounter for general adult medical examination with abnormal findings: Secondary | ICD-10-CM | POA: Diagnosis not present

## 2015-07-07 DIAGNOSIS — Z0000 Encounter for general adult medical examination without abnormal findings: Secondary | ICD-10-CM | POA: Diagnosis not present

## 2015-07-07 DIAGNOSIS — M810 Age-related osteoporosis without current pathological fracture: Secondary | ICD-10-CM

## 2015-07-07 DIAGNOSIS — Z79899 Other long term (current) drug therapy: Secondary | ICD-10-CM

## 2015-07-07 DIAGNOSIS — E782 Mixed hyperlipidemia: Secondary | ICD-10-CM

## 2015-07-07 DIAGNOSIS — N183 Chronic kidney disease, stage 3 unspecified: Secondary | ICD-10-CM

## 2015-07-07 DIAGNOSIS — R7303 Prediabetes: Secondary | ICD-10-CM

## 2015-07-07 DIAGNOSIS — Z136 Encounter for screening for cardiovascular disorders: Secondary | ICD-10-CM | POA: Diagnosis not present

## 2015-07-07 LAB — CBC WITH DIFFERENTIAL/PLATELET
BASOS ABS: 66 {cells}/uL (ref 0–200)
Basophils Relative: 1 %
EOS ABS: 396 {cells}/uL (ref 15–500)
Eosinophils Relative: 6 %
HEMATOCRIT: 35 % (ref 35.0–45.0)
HEMOGLOBIN: 11.7 g/dL (ref 11.7–15.5)
LYMPHS ABS: 1848 {cells}/uL (ref 850–3900)
Lymphocytes Relative: 28 %
MCH: 32.1 pg (ref 27.0–33.0)
MCHC: 33.4 g/dL (ref 32.0–36.0)
MCV: 95.9 fL (ref 80.0–100.0)
MONO ABS: 726 {cells}/uL (ref 200–950)
MPV: 9 fL (ref 7.5–12.5)
Monocytes Relative: 11 %
NEUTROS ABS: 3564 {cells}/uL (ref 1500–7800)
Neutrophils Relative %: 54 %
Platelets: 346 10*3/uL (ref 140–400)
RBC: 3.65 MIL/uL — ABNORMAL LOW (ref 3.80–5.10)
RDW: 13.3 % (ref 11.0–15.0)
WBC: 6.6 10*3/uL (ref 3.8–10.8)

## 2015-07-07 LAB — LIPID PANEL
CHOL/HDL RATIO: 2.1 ratio (ref <=5.0)
CHOLESTEROL: 113 mg/dL — AB (ref 125–200)
HDL: 54 mg/dL (ref >=46)
LDL Cholesterol: 34 mg/dL (ref <130)
Triglycerides: 126 mg/dL (ref <150)
VLDL: 25 mg/dL (ref <30)

## 2015-07-07 LAB — HEPATIC FUNCTION PANEL
ALT: 8 U/L (ref 6–29)
AST: 18 U/L (ref 10–35)
Albumin: 4 g/dL (ref 3.6–5.1)
Alkaline Phosphatase: 65 U/L (ref 33–130)
BILIRUBIN DIRECT: 0.1 mg/dL (ref <=0.2)
BILIRUBIN INDIRECT: 0.2 mg/dL (ref 0.2–1.2)
TOTAL PROTEIN: 6.2 g/dL (ref 6.1–8.1)
Total Bilirubin: 0.3 mg/dL (ref 0.2–1.2)

## 2015-07-07 LAB — BASIC METABOLIC PANEL WITH GFR
BUN: 11 mg/dL (ref 7–25)
CALCIUM: 8.7 mg/dL (ref 8.6–10.4)
CHLORIDE: 99 mmol/L (ref 98–110)
CO2: 22 mmol/L (ref 20–31)
CREATININE: 1.08 mg/dL — AB (ref 0.60–0.93)
GFR, Est African American: 60 mL/min (ref >=60)
GFR, Est Non African American: 52 mL/min — ABNORMAL LOW (ref >=60)
Glucose, Bld: 116 mg/dL — ABNORMAL HIGH (ref 65–99)
Potassium: 5.5 mmol/L — ABNORMAL HIGH (ref 3.5–5.3)
Sodium: 131 mmol/L — ABNORMAL LOW (ref 135–146)

## 2015-07-07 LAB — MAGNESIUM: MAGNESIUM: 1.7 mg/dL (ref 1.5–2.5)

## 2015-07-07 LAB — TSH: TSH: 1.61 m[IU]/L

## 2015-07-07 MED ORDER — GABAPENTIN 600 MG PO TABS: ORAL_TABLET | ORAL | Status: DC

## 2015-07-07 NOTE — Progress Notes (Signed)
Patient ID: [name], female   DOB: 1946/01/09, 70 y.o.   MRN: [id number]  Annual Screening/Preventative Visit And Comprehensive Evaluation &  Examination     This very nice 70 y.o. DWF presents for a Wellness/Preventative Visit & comprehensive evaluation and management of multiple medical co-morbidities.  Patient has been followed for HTN, COPD, Prediabetes, Hyperlipidemia and Vitamin D Deficiency.      HTN predates circa 1998. Patient had a negative Cardiolite in 2000.  Patient's BP has been controlled at home and patient denies any cardiac symptoms as chest pain, palpitations, shortness of breath, dizziness or ankle swelling. Today's BP: 140/76 mmHg . Patient had CVA's x 2 in 2003 & 2004 with full recovery. Patient has COPD/GOLD iii and has been evaluated in the past by Dr's [facility] & Dr Melvyn Novas.      Patient's hyperlipidemia is controlled with diet and medications. Patient denies myalgias or other medication SE's. Last lipids were at goal with Cholesterol 118*; HDL 66; LDL 31; Triglycerides 103 on 03/30/2015.     Patient has prediabetes predating since 2012 with A1c 5.9%  and patient denies reactive hypoglycemic symptoms, visual blurring, diabetic polys, or paresthesias. Last A1c was 5.6% on 12/15/2014.     Finally, patient has history of Vitamin D Deficiency and last Vitamin D was at goal at 111 on 03/30/2015.   Medication Sig  . aspirin EC 81 MG Take 81 mg by mouth daily.  Marland Kitchen b complex vitamins Take 1 tablet by mouth daily.  Marland Kitchen VITAMIN D 1000 UNITS  Take 5,000 Units by mouth daily.  Marland Kitchen gabapentin300 MG  Take 600 mg by mouth at bedtime.  . Iron-Vitamin C  Take 1 tablet by mouth. Takes every other day  . LORazepam  1 MG  TAKE 1/2 TO 1 TABLET BY MOUTH THREE TIMES DAILY AS NEEDED FOR ANXIETY OR HOT FLASHES  . losartan  100 MG  Take 100 mg by mouth daily.  . Magnesium 500 MG  Take 500 mg by mouth daily.   . mirtazapine  45 MG  TAKE 1 TABLET BY MOUTH EVERY NIGHT AT BEDTIME  . montelukast10 MG TAKE 1  TABLET BY MOUTH ONCE DAILY  . Benadryl Takes  3 tabs at hs  . tylenol PM Takes  3 at hs.  . oxybutynin  5 MG  TAKE 1/2 TO 1 TABLET BY MOUTH THREE TIMES DAILY FOR BLADDER CONTROL  . QUEtiapine  400 MG  Take 1 tablet (400 mg total) by mouth at bedtime.  . quinapril (ACCUPRIL) 40 MG  TAKE 1 TABLET BY MOUTH AT BEDTIME.  . ranitidine (ZANTAC) 300 MG  Take 1 tablet (300 mg total) by mouth 2 (two) times daily.  . traMADol (ULTRAM) 50 MG  TAKE 1 TABLET BY MOUTH TWICE DAILY AS NEEDED FOR PAIN  . vitamin C  500 MG  Take 500 mg by mouth daily.   Allergies  Allergen Reactions  . Amoxapine And Related Nausea And Vomiting  . Amoxicillin     itching  . Ciprocinonide [Fluocinolone]     Pt unsure  . Ciprofloxacin     Pt unsure  . Doxycycline Nausea And Vomiting  . Erythromycin Hives  . Sulfa Antibiotics Other (See Comments)    Unknown-reaction as a child  . Zofran [Ondansetron Hcl] Nausea And Vomiting   Past Medical History  Diagnosis Date  . Hypertension   . GERD (gastroesophageal reflux disease)   . Stroke [facility])     was on Plavix - not now  .  Carotid artery aneurysm ([place]) 03/04/2012  . DJD (degenerative joint disease)   . COPD (chronic obstructive pulmonary disease) ([place])   . Depression    Health Maintenance  Topic Date Due  . Hepatitis C Screening  [birth year]  . INFLUENZA VACCINE  10/25/2015  . MAMMOGRAM  07/03/2017  . COLONOSCOPY  11/12/2019  . TETANUS/TDAP  03/26/2021  . DEXA SCAN  Completed  . ZOSTAVAX  Completed  . PNA vac Low Risk Adult  Completed   Immunization History  Administered Date(s) Administered  . Influenza Whole 12/25/2011, 12/02/2012  . Influenza, High Dose Seasonal PF 12/14/2013, 12/15/2014  . Pneumococcal Conjugate-13 12/14/2013  . Pneumococcal-Unspecified 01/29/2007, 02/02/2013  . Td 03/27/2011  . Zoster 01/23/2006   Past Surgical History  Procedure Laterality Date  . Video bronchoscopy  04/24/2012    Procedure: VIDEO BRONCHOSCOPY WITHOUT FLUORO;   Surgeon: Rigoberto Noel, MD;  Location: [place];  Service: Cardiopulmonary;  Laterality: Bilateral;  . Tubal ligation  30 years  . Tonsillectomy    . Colonoscopy  2004  . Hernia repair Right 2014    inguinal  . Cataract extraction Bilateral   . Microlaryngoscopy with co2 laser and excision of vocal cord lesion N/A 06/05/2013    Procedure: MICROLARYNGOSCOPY WITH CO2 LASER AND EXCISION OF VOCAL CORD POLYPS;  Surgeon: Melida Quitter, MD;  Location: [facility] OR;  Service: ENT;  Laterality: N/A;  . Kyphoplasty N/A 10/28/2014    Procedure: KYPHOPLASTY;  Surgeon: Phylliss Bob, MD;  Location: [place];  Service: Orthopedics;  Laterality: N/A;  Lumbar 1 kyphoplasty   Family History  Problem Relation Age of Onset  . Breast cancer Mother   . Breast cancer Sister    Social History  Substance Use Topics  . Smoking status: Current Every Day Smoker -- 0.25 packs/day for 30 years    Types: Cigarettes  . Smokeless tobacco: Never Used  . Alcohol Use: No    ROS Constitutional: Denies fever, chills, weight loss/gain, headaches, insomnia,  night sweats, and change in appetite. Does c/o fatigue. Eyes: Denies redness, blurred vision, diplopia, discharge, itchy, watery eyes.  ENT: Denies discharge, congestion, post nasal drip, epistaxis, sore throat, earache, hearing loss, dental pain, Tinnitus, Vertigo, Sinus pain, snoring.  Cardio: Denies chest pain, palpitations, irregular heartbeat, syncope, dyspnea, diaphoresis, orthopnea, PND, claudication, edema Respiratory: denies cough, dyspnea, DOE, pleurisy, hoarseness, laryngitis, wheezing.  Gastrointestinal: Denies dysphagia, heartburn, reflux, water brash, pain, cramps, nausea, vomiting, bloating, diarrhea, constipation, hematemesis, melena, hematochezia, jaundice, hemorrhoids Genitourinary: Denies dysuria, frequency, urgency, nocturia, hesitancy, discharge, hematuria, flank pain Breast: Breast lumps, nipple discharge, bleeding.  Musculoskeletal: Denies arthralgia,  myalgia, stiffness, Jt. Swelling, pain, limp, and strain/sprain. Denies falls. Skin: Denies puritis, rash, hives, warts, acne, eczema, changing in skin lesion Neuro: No weakness, tremor, incoordination, spasms, paresthesia, pain Psychiatric: Denies confusion, memory loss, sensory loss. Denies Depression. Endocrine: Denies change in weight, skin, hair change, nocturia, and paresthesia, diabetic polys, visual blurring, hyper / hypo glycemic episodes.  Heme/Lymph: No excessive bleeding, bruising, enlarged lymph nodes.  Physical Exam  BP 140/76 mmHg  Pulse 88  Temp(Src) 97.9 F (36.6 C)  Resp 16  Ht 5' (1.524 m)  Wt 110 lb (49.896 kg)  BMI 21.48 kg/m2  General Appearance: Well nourished and in no apparent distress. Eyes: PERRLA, EOMs, conjunctiva no swelling or erythema, normal fundi and vessels. Sinuses: No frontal/maxillary tenderness ENT/Mouth: EACs patent / TMs  nl. Nares clear without erythema, swelling, mucoid exudates. Oral hygiene is good. No erythema, swelling, or exudate. Tongue normal, non-obstructing.  Tonsils not swollen or erythematous. Hearing normal.  Neck: Supple, thyroid normal. No bruits, nodes or JVD. Respiratory: Respiratory effort normal.  BS distant, equal and clear bilateral without rales, rhonci, wheezing or stridor. Cardio: Heart sounds are normal with regular rate and rhythm and no murmurs, rubs or gallops. Peripheral pulses are normal and equal bilaterally without edema. No aortic or femoral bruits. Chest: symmetric with normal excursions and percussion. Breasts: Deferred by patient - had recent MGM. Abdomen: Flat, soft, with bowel sounds. Nontender, no guarding, rebound, hernias, masses, or organomegaly.  Lymphatics: Non tender without lymphadenopathy.  Musculoskeletal: Full ROM all peripheral extremities, joint stability, 5/5 strength, and normal gait. Skin: Warm and dry without rashes, lesions, cyanosis, clubbing or  ecchymosis.  Neuro: Cranial nerves intact,  reflexes equal bilaterally. Normal muscle tone, no cerebellar symptoms. Sensation intact.  Pysch: Alert and oriented X 3, normal affect, Insight and Judgment appropriate.   Assessment and Plan  1. Annual Preventative Screening Examination   - Microalbumin / creatinine urine ratio - EKG 12-Lead - [place], RETROPERITNL ABD,  LTD - POC Hemoccult Bld/Stl  - Urinalysis, Routine w reflex microscopic  - CBC with Differential/Platelet - BASIC METABOLIC PANEL WITH GFR - Hepatic function panel - Magnesium - Lipid panel - TSH - Hemoglobin A1c - Insulin, random - VITAMIN D 25 Hydroxy   2. Essential hypertension  - Microalbumin / creatinine urine ratio - EKG 12-Lead - [place], RETROPERITNL ABD,  LTD - TSH  3. Hyperlipidemia  - Lipid panel - TSH  4. Prediabetes  - Hemoglobin A1c - Insulin, random  5. Vitamin D deficiency  - VITAMIN D 25 Hydroxy  6. CKD (chronic kidney disease) stage 3, GFR 30-59 ml/min  - Microalbumin / creatinine urine ratio  7. COPD GOLD III   8. Osteoporosis   9. Screening for rectal cancer  - POC Hemoccult Bld/Stl   10. Medication management  - Urinalysis, Routine w reflex microscopic  - CBC with Differential/Platelet - BASIC METABOLIC PANEL WITH GFR - Hepatic function panel - Magnesium  11. Screening for ischemic heart disease   12. Screening for AAA (aortic abdominal aneurysm)   Continue prudent diet as discussed, weight control, BP monitoring, regular exercise, and medications. Discussed med's effects and SE's. Screening labs and tests as requested with regular follow-up as recommended. Over 40 minutes of exam, counseling, chart review and high complex critical decision making was performed.

## 2015-07-07 NOTE — Patient Instructions (Signed)

## 2015-07-08 LAB — URINALYSIS, ROUTINE W REFLEX MICROSCOPIC
Bilirubin Urine: NEGATIVE
Glucose, UA: NEGATIVE
HGB URINE DIPSTICK: NEGATIVE
Ketones, ur: NEGATIVE
LEUKOCYTES UA: NEGATIVE
NITRITE: NEGATIVE
Protein, ur: NEGATIVE
Specific Gravity, Urine: 1.01 (ref 1.001–1.035)
pH: 6 (ref 5.0–8.0)

## 2015-07-08 LAB — HEMOGLOBIN A1C
HEMOGLOBIN A1C: 5.3 % (ref <5.7)
MEAN PLASMA GLUCOSE: 105 mg/dL

## 2015-07-08 LAB — VITAMIN D 25 HYDROXY (VIT D DEFICIENCY, FRACTURES): Vit D, 25-Hydroxy: 79 ng/mL (ref 30–100)

## 2015-07-08 LAB — MICROALBUMIN / CREATININE URINE RATIO
CREATININE, URINE: 49 mg/dL (ref 20–320)
MICROALB UR: 0.2 mg/dL
Microalb Creat Ratio: 4 mcg/mg creat (ref <30)

## 2015-07-08 LAB — INSULIN, RANDOM: Insulin: 29.8 u[IU]/mL — ABNORMAL HIGH (ref 2.0–19.6)

## 2015-07-12 ENCOUNTER — Encounter: Payer: Self-pay | Admitting: Podiatry

## 2015-07-12 ENCOUNTER — Ambulatory Visit (INDEPENDENT_AMBULATORY_CARE_PROVIDER_SITE_OTHER): Payer: Commercial Managed Care - HMO | Admitting: Podiatry

## 2015-07-12 VITALS — BP 155/84 | HR 69 | Resp 12

## 2015-07-12 DIAGNOSIS — B351 Tinea unguium: Secondary | ICD-10-CM | POA: Diagnosis not present

## 2015-07-12 DIAGNOSIS — M79676 Pain in unspecified toe(s): Secondary | ICD-10-CM | POA: Diagnosis not present

## 2015-07-12 NOTE — Progress Notes (Signed)
She presents today for chief complaint of painful elongated toenails 1 through 5 bilateral.  Objective: Vital signs stable alert and oriented 3 pulses are palpable. Mild edema about the right foot from previous fractures. Toenails are thick yellow dystrophic onychomycotic and sharply incurvated.  Assessment: Pain in limb secondary to onychomycosis and edema to the right lower extremity status post fracture.  Plan: Debridement of toenails 1 through 5 bilateral covered service.

## 2015-07-14 DIAGNOSIS — Z8669 Personal history of other diseases of the nervous system and sense organs: Secondary | ICD-10-CM | POA: Diagnosis not present

## 2015-07-14 DIAGNOSIS — J449 Chronic obstructive pulmonary disease, unspecified: Secondary | ICD-10-CM | POA: Diagnosis not present

## 2015-07-14 DIAGNOSIS — R0602 Shortness of breath: Secondary | ICD-10-CM | POA: Diagnosis not present

## 2015-07-14 DIAGNOSIS — R490 Dysphonia: Secondary | ICD-10-CM | POA: Diagnosis not present

## 2015-07-27 ENCOUNTER — Other Ambulatory Visit: Payer: Self-pay | Admitting: *Deleted

## 2015-07-27 MED ORDER — MAGNESIUM 500 MG PO TABS: ORAL_TABLET | ORAL | Status: DC

## 2015-07-31 ENCOUNTER — Other Ambulatory Visit: Payer: Self-pay | Admitting: Internal Medicine

## 2015-08-01 ENCOUNTER — Other Ambulatory Visit: Payer: Self-pay | Admitting: Internal Medicine

## 2015-08-02 ENCOUNTER — Telehealth: Payer: Self-pay | Admitting: *Deleted

## 2015-08-02 ENCOUNTER — Ambulatory Visit: Payer: Commercial Managed Care - HMO | Admitting: Podiatry

## 2015-08-02 NOTE — Telephone Encounter (Signed)
Patient called and states the cost of Imodium is expensive.  Per Dr Melford Aase, try the store brand at at lower cost.  A message was left to inform the patient.

## 2015-08-11 ENCOUNTER — Ambulatory Visit: Payer: Commercial Managed Care - HMO | Admitting: Podiatry

## 2015-08-12 DIAGNOSIS — M545 Low back pain: Secondary | ICD-10-CM | POA: Diagnosis not present

## 2015-08-14 ENCOUNTER — Encounter: Payer: Self-pay | Admitting: *Deleted

## 2015-08-16 ENCOUNTER — Other Ambulatory Visit: Payer: Self-pay | Admitting: Physician Assistant

## 2015-08-18 ENCOUNTER — Ambulatory Visit: Payer: Commercial Managed Care - HMO | Admitting: Podiatry

## 2015-08-23 ENCOUNTER — Other Ambulatory Visit: Payer: Self-pay | Admitting: Internal Medicine

## 2015-08-23 DIAGNOSIS — M549 Dorsalgia, unspecified: Secondary | ICD-10-CM

## 2015-08-25 ENCOUNTER — Other Ambulatory Visit: Payer: Self-pay | Admitting: Internal Medicine

## 2015-09-01 ENCOUNTER — Ambulatory Visit: Payer: Commercial Managed Care - HMO | Admitting: Podiatry

## 2015-09-01 ENCOUNTER — Telehealth: Payer: Self-pay | Admitting: *Deleted

## 2015-09-01 NOTE — Telephone Encounter (Signed)
Patient called and states she received a package she believes to be her RX of Tramadol from a mail order pharmacy in [place].  She has not opened the package because she is afraid the cost will be much higher than her local [facility]. After calling [facility] on [address], I was told the package is from their specialty mail in pharmacy and the cost will be her usual co-pay of &3.30, which the pharmacy could verify on their computer system.  The patient was informed of the situation.

## 2015-09-08 ENCOUNTER — Ambulatory Visit: Payer: Commercial Managed Care - HMO | Admitting: Podiatry

## 2015-09-08 ENCOUNTER — Other Ambulatory Visit: Payer: Self-pay | Admitting: Internal Medicine

## 2015-09-22 ENCOUNTER — Ambulatory Visit: Payer: Commercial Managed Care - HMO | Admitting: Podiatry

## 2015-10-10 ENCOUNTER — Telehealth: Payer: Self-pay | Admitting: Podiatry

## 2015-10-10 NOTE — Telephone Encounter (Signed)
Pt called and wanted to talk with you only about her bill.Pt states it went up a bit and she is just wondering what for.Pt aware you are out of the office and to expect a call next week.

## 2015-10-15 ENCOUNTER — Other Ambulatory Visit: Payer: Self-pay | Admitting: Internal Medicine

## 2015-10-22 ENCOUNTER — Other Ambulatory Visit: Payer: Self-pay | Admitting: Physician Assistant

## 2015-10-25 ENCOUNTER — Ambulatory Visit (INDEPENDENT_AMBULATORY_CARE_PROVIDER_SITE_OTHER): Payer: Commercial Managed Care - HMO | Admitting: Internal Medicine

## 2015-10-25 ENCOUNTER — Other Ambulatory Visit: Payer: Self-pay | Admitting: *Deleted

## 2015-10-25 ENCOUNTER — Encounter: Payer: Self-pay | Admitting: Internal Medicine

## 2015-10-25 VITALS — BP 100/66 | HR 76 | Temp 97.7°F | Ht 60.0 in | Wt 107.6 lb

## 2015-10-25 DIAGNOSIS — Z79899 Other long term (current) drug therapy: Secondary | ICD-10-CM

## 2015-10-25 DIAGNOSIS — J449 Chronic obstructive pulmonary disease, unspecified: Secondary | ICD-10-CM | POA: Diagnosis not present

## 2015-10-25 DIAGNOSIS — M549 Dorsalgia, unspecified: Secondary | ICD-10-CM

## 2015-10-25 DIAGNOSIS — E559 Vitamin D deficiency, unspecified: Secondary | ICD-10-CM

## 2015-10-25 DIAGNOSIS — R7303 Prediabetes: Secondary | ICD-10-CM

## 2015-10-25 DIAGNOSIS — I10 Essential (primary) hypertension: Secondary | ICD-10-CM | POA: Diagnosis not present

## 2015-10-25 DIAGNOSIS — E782 Mixed hyperlipidemia: Secondary | ICD-10-CM | POA: Diagnosis not present

## 2015-10-25 LAB — HEPATIC FUNCTION PANEL
ALT: 6 U/L (ref 6–29)
AST: 17 U/L (ref 10–35)
Albumin: 4 g/dL (ref 3.6–5.1)
Alkaline Phosphatase: 66 U/L (ref 33–130)
BILIRUBIN DIRECT: 0.1 mg/dL (ref <=0.2)
BILIRUBIN INDIRECT: 0.2 mg/dL (ref 0.2–1.2)
Total Bilirubin: 0.3 mg/dL (ref 0.2–1.2)
Total Protein: 6.3 g/dL (ref 6.1–8.1)

## 2015-10-25 LAB — BASIC METABOLIC PANEL WITH GFR
BUN: 17 mg/dL (ref 7–25)
CHLORIDE: 103 mmol/L (ref 98–110)
CO2: 22 mmol/L (ref 20–31)
Calcium: 8.7 mg/dL (ref 8.6–10.4)
Creat: 1.33 mg/dL — ABNORMAL HIGH (ref 0.60–0.93)
GFR, EST NON AFRICAN AMERICAN: 40 mL/min — AB (ref >=60)
GFR, Est African American: 46 mL/min — ABNORMAL LOW (ref >=60)
Glucose, Bld: 98 mg/dL (ref 65–99)
POTASSIUM: 4.8 mmol/L (ref 3.5–5.3)
SODIUM: 134 mmol/L — AB (ref 135–146)

## 2015-10-25 LAB — CBC WITH DIFFERENTIAL/PLATELET
BASOS ABS: 95 {cells}/uL (ref 0–200)
BASOS PCT: 1 %
EOS PCT: 8 %
Eosinophils Absolute: 760 cells/uL — ABNORMAL HIGH (ref 15–500)
HCT: 36.4 % (ref 35.0–45.0)
HEMOGLOBIN: 12.1 g/dL (ref 11.7–15.5)
LYMPHS ABS: 3230 {cells}/uL (ref 850–3900)
Lymphocytes Relative: 34 %
MCH: 31.5 pg (ref 27.0–33.0)
MCHC: 33.2 g/dL (ref 32.0–36.0)
MCV: 94.8 fL (ref 80.0–100.0)
MPV: 9.4 fL (ref 7.5–12.5)
Monocytes Absolute: 1235 cells/uL — ABNORMAL HIGH (ref 200–950)
Monocytes Relative: 13 %
NEUTROS ABS: 4180 {cells}/uL (ref 1500–7800)
Neutrophils Relative %: 44 %
Platelets: 319 10*3/uL (ref 140–400)
RBC: 3.84 MIL/uL (ref 3.80–5.10)
RDW: 13.9 % (ref 11.0–15.0)
WBC: 9.5 10*3/uL (ref 3.8–10.8)

## 2015-10-25 LAB — LIPID PANEL
CHOL/HDL RATIO: 2.3 ratio (ref <=5.0)
Cholesterol: 117 mg/dL — ABNORMAL LOW (ref 125–200)
HDL: 51 mg/dL (ref >=46)
LDL Cholesterol: 34 mg/dL (ref <130)
Triglycerides: 158 mg/dL — ABNORMAL HIGH (ref <150)
VLDL: 32 mg/dL — AB (ref <30)

## 2015-10-25 LAB — HEMOGLOBIN A1C
HEMOGLOBIN A1C: 5.6 % (ref <5.7)
Mean Plasma Glucose: 114 mg/dL

## 2015-10-25 LAB — MAGNESIUM: Magnesium: 1.8 mg/dL (ref 1.5–2.5)

## 2015-10-25 LAB — TSH: TSH: 2.17 m[IU]/L

## 2015-10-25 MED ORDER — TRAMADOL HCL 50 MG PO TABS: 50.0000 mg | ORAL_TABLET | Freq: Two times a day (BID) | ORAL | 2 refills | Status: DC | PRN

## 2015-10-25 NOTE — Progress Notes (Signed)
[facility]ULT & [facility]ENT [facility]ICINE                       Unk Pinto, M.D.        Uvaldo Bristle. Silverio Lay, P.A.-C       Starlyn Skeans, P.A.-C   [facility]                [address] [place], [place] [id number] Telephone [phone] Telefax [phone] ______________________________________________________________________     This very nice 70 y.o.DWF presents for 3 month follow up with Hypertension, Hyperlipidemia, Pre-Diabetes and Vitamin D Deficiency.      Patient is treated for HTN circa 1998 & BP has been controlled at home. Today's BP: 100/66. Patient has had no complaints of any cardiac type chest pain, palpitations, dyspnea/orthopnea/PND, dizziness, claudication, or dependent edema.Patient also has COPD and has been evaluated for chronic hoarseness.      Hyperlipidemia is controlled with diet. Last Lipids were at goal: Lab Results  Component Value Date   CHOL 113 (L) 07/07/2015   HDL 54 07/07/2015   LDLCALC 34 07/07/2015   TRIG 126 07/07/2015   CHOLHDL 2.1 07/07/2015      Also, the patient has history of PreDiabetes with A1c 5.9% in 2012 and has had no symptoms of reactive hypoglycemia, diabetic polys, paresthesias or visual blurring.  Last A1c was  Lab Results  Component Value Date   HGBA1C 5.3 07/07/2015      Further, the patient also has history of Vitamin D Deficiency of 48 in 2008 and 44 in 2015 and supplements vitamin D without any suspected side-effects. Last vitamin D was at goal: Lab Results  Component Value Date   VD25OH 79 07/07/2015   Current Outpatient Prescriptions on File Prior to Visit  Medication Sig  . aspirin EC 81 MG tablet Take 81 mg by mouth daily.  Marland Kitchen b complex vitamins tablet Take 1 tablet by mouth daily.  . cholecalciferol (VITAMIN D) 1000 UNITS tablet Take 5,000 Units by mouth daily.  Marland Kitchen gabapentin (NEURONTIN) 600 MG tablet Take 1/2 to 1 tablet 3 x day as directed.  Marland Kitchen LORazepam  (ATIVAN) 1 MG tablet TAKE 1/2 TO 1 TABLET BY MOUTH THREE TIMES DAILY AS NEEDED FOR ANXIETY OR HOT FLASHES  . losartan (COZAAR) 100 MG tablet Take 100 mg by mouth daily.  . mirtazapine (REMERON) 45 MG tablet TAKE 1 TABLET BY MOUTH EVERY NIGHT AT BEDTIME  . montelukast (SINGULAIR) 10 MG tablet TAKE 1 TABLET BY MOUTH ONCE DAILY  . OVER THE COUNTER MEDICATION Takes OTC Benadryl 3 tabs at hs  . oxybutynin (DITROPAN) 5 MG tablet TAKE 1/2 TO 1 TABLET BY MOUTH THREE TIMES DAILY FOR BLADDER CONTROL  . QUEtiapine (SEROQUEL) 400 MG tablet TAKE 1 TABLET BY MOUTH EVERY NIGHT AT BEDTIME  . quinapril (ACCUPRIL) 40 MG tablet TAKE 1 TABLET BY MOUTH AT BEDTIME.  . ranitidine (ZANTAC) 300 MG tablet Take 1 tablet (300 mg total) by mouth 2 (two) times daily.  . vitamin C (ASCORBIC ACID) 500 MG tablet Take 500 mg by mouth daily.  . Iron-Vitamin C (VITRON-C PO) Take 1 tablet by mouth. Takes every other day  . Magnesium 500 MG TABS Take 500 mg tablet twice daily with meals. (Patient not taking: Reported on 10/25/2015)   No current facility-administered medications on file prior to visit.    Allergies  Allergen Reactions  . Amoxapine And Related Nausea And Vomiting  . Amoxicillin     itching  . Ciprocinonide [Fluocinolone]     Pt unsure  . Ciprofloxacin     Pt unsure  . Doxycycline Nausea And Vomiting  . Erythromycin Hives  . Sulfa Antibiotics Other (See Comments)    Unknown-reaction as a child  . Zofran [Ondansetron Hcl] Nausea And Vomiting   PMHx:   Past Medical History:  Diagnosis Date  . Carotid artery aneurysm ([place]) 03/04/2012  . COPD (chronic obstructive pulmonary disease) ([place])   . Depression   . DJD (degenerative joint disease)   . GERD (gastroesophageal reflux disease)   . Hypertension   . Stroke [facility])    was on Plavix - not now   Immunization History  Administered Date(s) Administered  . Influenza Whole 12/25/2011, 12/02/2012  . Influenza, High Dose Seasonal PF 12/14/2013, 12/15/2014  .  Pneumococcal Conjugate-13 12/14/2013  . Pneumococcal-Unspecified 01/29/2007, 02/02/2013  . Td 03/27/2011  . Zoster 01/23/2006   Past Surgical History:  Procedure Laterality Date  . CATARACT EXTRACTION Bilateral   . COLONOSCOPY  2004  . HERNIA REPAIR Right 2014   inguinal  . KYPHOPLASTY N/A 10/28/2014   Procedure: KYPHOPLASTY;  Surgeon: Phylliss Bob, MD;  Location: [place];  Service: Orthopedics;  Laterality: N/A;  Lumbar 1 kyphoplasty  . MICROLARYNGOSCOPY WITH CO2 LASER AND EXCISION OF VOCAL CORD LESION N/A 06/05/2013   Procedure: MICROLARYNGOSCOPY WITH CO2 LASER AND EXCISION OF VOCAL CORD POLYPS;  Surgeon: Melida Quitter, MD;  Location: [place];  Service: ENT;  Laterality: N/A;  . TONSILLECTOMY    . TUBAL LIGATION  30 years  . VIDEO BRONCHOSCOPY  04/24/2012   Procedure: VIDEO BRONCHOSCOPY WITHOUT FLUORO;  Surgeon: Rigoberto Noel, MD;  Location: [place];  Service: Cardiopulmonary;  Laterality: Bilateral;   FHx:    Reviewed / unchanged  SHx:    Reviewed / unchanged  Systems Review:  Constitutional: Denies fever, chills, wt changes, headaches, insomnia, fatigue, night sweats, change in appetite. Eyes: Denies redness, blurred vision, diplopia, discharge, itchy, watery eyes.  ENT: Denies discharge, congestion, post nasal drip, epistaxis, sore throat, earache, hearing loss, dental pain, tinnitus, vertigo, sinus pain, snoring.  CV: Denies chest pain, palpitations, irregular heartbeat, syncope, dyspnea, diaphoresis, orthopnea, PND, claudication or edema. Respiratory: denies cough,  pleurisy, laryngitis, wheezing. Reports DOE with walking, Gastrointestinal: Denies dysphagia, odynophagia, heartburn, reflux, water brash, abdominal pain or cramps, nausea, vomiting, bloating, diarrhea, constipation, hematemesis, melena, hematochezia  or hemorrhoids. Genitourinary: Denies dysuria, frequency, urgency, nocturia, hesitancy, discharge, hematuria or flank pain. Musculoskeletal: Denies arthralgias, myalgias,  stiffness, jt. swelling, pain, limping or strain/sprain.  Skin: Denies pruritus, rash, hives, warts, acne, eczema or change in skin lesion(s). Neuro: No weakness, tremor, incoordination, spasms, paresthesia or pain. Psychiatric: Denies confusion, memory loss or sensory loss. Endo: Denies change in weight, skin or hair change.  Heme/Lymph: No excessive bleeding, bruising or enlarged lymph nodes.  Physical Exam  BP 100/66   Pulse 76   Temp 97.7 F (36.5 C)   Ht 5' (1.524 m)   Wt 107 lb 9.6 oz (48.8 kg)   BMI 21.01 kg/m   Appears chronically ill, but in no distress. Speech is hoarse.   Eyes: PERRLA, EOMs, conjunctiva no swelling or erythema. Sinuses: No frontal/maxillary tenderness ENT/Mouth: EAC's clear, TM's nl w/o erythema, bulging. Nares clear w/o erythema, swelling, exudates. Oropharynx clear without erythema or exudates. Oral hygiene is good. Tongue normal, non obstructing. Hearing intact.  Neck: Supple. Thyroid  nl. Car 2+/2+ without bruits, nodes or JVD. Chest: Respirations nl with BS clear & equal w/o rales, rhonchi, wheezing or stridor.  Cor: Heart sounds normal w/ regular rate and rhythm without sig. murmurs, gallops, clicks, or rubs. Peripheral pulses normal and equal  without edema.  Abdomen: Soft & bowel sounds normal. Non-tender w/o guarding, rebound, hernias, masses, or organomegaly.  Lymphatics: Unremarkable.  Musculoskeletal: Full ROM all peripheral extremities, joint stability, 5/5 strength, and normal gait.  Skin: Warm, dry without exposed rashes, lesions or ecchymosis apparent.  Neuro: Cranial nerves intact, reflexes equal bilaterally. Sensory-motor testing grossly intact. Tendon reflexes grossly intact 1+.  Pysch: Alert & oriented x 3.  Insight and judgement is limited. No ideations.  Assessment and Plan:  1. Essential hypertension  - Continue medication, monitor blood pressure at home. Continue DASH diet. Reminder to go to the ER if any CP, SOB, nausea,  dizziness, severe HA, changes vision/speech, left arm numbness and tingling and jaw pain. - TSH  2. Hyperlipidemia  - Continue diet/meds, exercise,& lifestyle modifications. Continue monitor periodic cholesterol/liver & renal functions  - Lipid panel - TSH  3. Prediabetes  - Continue diet, exercise, lifestyle modifications. Monitor appropriate labs. - Hemoglobin A1c - Insulin, random  4. Vitamin D deficiency  - Continue supplementation. - VITAMIN D 25 Hydroxy   5. COPD GOLD III   6. Medication management  - CBC with Differential/Platelet - BASIC METABOLIC PANEL WITH GFR - Hepatic function panel - Magnesium   Recommended regular exercise, BP monitoring, weight control, and discussed med and SE's. Recommended labs to assess and monitor clinical status. Further disposition pending results of labs. Over 30 minutes of exam, counseling, chart review was performed

## 2015-10-25 NOTE — Patient Instructions (Signed)

## 2015-10-26 LAB — VITAMIN D 25 HYDROXY (VIT D DEFICIENCY, FRACTURES): Vit D, 25-Hydroxy: 92 ng/mL (ref 30–100)

## 2015-10-26 LAB — INSULIN, RANDOM: Insulin: 11.9 u[IU]/mL (ref 2.0–19.6)

## 2015-11-11 ENCOUNTER — Other Ambulatory Visit: Payer: Self-pay | Admitting: Internal Medicine

## 2015-11-18 ENCOUNTER — Other Ambulatory Visit: Payer: Self-pay | Admitting: Internal Medicine

## 2015-12-30 ENCOUNTER — Ambulatory Visit: Payer: Self-pay

## 2016-01-02 ENCOUNTER — Ambulatory Visit: Payer: Self-pay

## 2016-01-13 ENCOUNTER — Other Ambulatory Visit: Payer: Self-pay | Admitting: Internal Medicine

## 2016-01-13 DIAGNOSIS — M549 Dorsalgia, unspecified: Secondary | ICD-10-CM

## 2016-01-23 ENCOUNTER — Other Ambulatory Visit: Payer: Self-pay | Admitting: Internal Medicine

## 2016-01-25 ENCOUNTER — Ambulatory Visit: Payer: Commercial Managed Care - HMO | Admitting: Internal Medicine

## 2016-01-25 ENCOUNTER — Ambulatory Visit: Payer: Self-pay | Admitting: Internal Medicine

## 2016-01-25 ENCOUNTER — Ambulatory Visit (INDEPENDENT_AMBULATORY_CARE_PROVIDER_SITE_OTHER): Payer: Commercial Managed Care - HMO | Admitting: Internal Medicine

## 2016-01-25 ENCOUNTER — Encounter: Payer: Self-pay | Admitting: Internal Medicine

## 2016-01-25 VITALS — BP 134/70 | HR 88 | Temp 98.0°F | Resp 18 | Ht 60.0 in | Wt 104.0 lb

## 2016-01-25 DIAGNOSIS — J449 Chronic obstructive pulmonary disease, unspecified: Secondary | ICD-10-CM

## 2016-01-25 DIAGNOSIS — E782 Mixed hyperlipidemia: Secondary | ICD-10-CM | POA: Diagnosis not present

## 2016-01-25 DIAGNOSIS — E559 Vitamin D deficiency, unspecified: Secondary | ICD-10-CM

## 2016-01-25 DIAGNOSIS — R7303 Prediabetes: Secondary | ICD-10-CM | POA: Diagnosis not present

## 2016-01-25 DIAGNOSIS — Z23 Encounter for immunization: Secondary | ICD-10-CM

## 2016-01-25 DIAGNOSIS — N183 Chronic kidney disease, stage 3 unspecified: Secondary | ICD-10-CM

## 2016-01-25 DIAGNOSIS — Z79899 Other long term (current) drug therapy: Secondary | ICD-10-CM

## 2016-01-25 DIAGNOSIS — I10 Essential (primary) hypertension: Secondary | ICD-10-CM | POA: Diagnosis not present

## 2016-01-25 DIAGNOSIS — D638 Anemia in other chronic diseases classified elsewhere: Secondary | ICD-10-CM

## 2016-01-25 LAB — CBC WITH DIFFERENTIAL/PLATELET
BASOS ABS: 88 {cells}/uL (ref 0–200)
Basophils Relative: 1 %
EOS ABS: 352 {cells}/uL (ref 15–500)
EOS PCT: 4 %
HCT: 36.5 % (ref 35.0–45.0)
Hemoglobin: 12 g/dL (ref 11.7–15.5)
Lymphocytes Relative: 28 %
Lymphs Abs: 2464 cells/uL (ref 850–3900)
MCH: 31.6 pg (ref 27.0–33.0)
MCHC: 32.9 g/dL (ref 32.0–36.0)
MCV: 96.1 fL (ref 80.0–100.0)
MONOS PCT: 12 %
MPV: 9.1 fL (ref 7.5–12.5)
Monocytes Absolute: 1056 cells/uL — ABNORMAL HIGH (ref 200–950)
NEUTROS PCT: 55 %
Neutro Abs: 4840 cells/uL (ref 1500–7800)
PLATELETS: 328 10*3/uL (ref 140–400)
RBC: 3.8 MIL/uL (ref 3.80–5.10)
RDW: 13.9 % (ref 11.0–15.0)
WBC: 8.8 10*3/uL (ref 3.8–10.8)

## 2016-01-25 LAB — LIPID PANEL
CHOL/HDL RATIO: 2.5 ratio (ref <=5.0)
Cholesterol: 122 mg/dL — ABNORMAL LOW (ref 125–200)
HDL: 49 mg/dL (ref >=46)
LDL Cholesterol: 42 mg/dL (ref <130)
Triglycerides: 153 mg/dL — ABNORMAL HIGH (ref <150)
VLDL: 31 mg/dL — AB (ref <30)

## 2016-01-25 LAB — HEPATIC FUNCTION PANEL
ALBUMIN: 3.8 g/dL (ref 3.6–5.1)
ALT: 5 U/L — AB (ref 6–29)
AST: 17 U/L (ref 10–35)
Alkaline Phosphatase: 68 U/L (ref 33–130)
BILIRUBIN DIRECT: 0.1 mg/dL (ref <=0.2)
BILIRUBIN TOTAL: 0.3 mg/dL (ref 0.2–1.2)
Indirect Bilirubin: 0.2 mg/dL (ref 0.2–1.2)
Total Protein: 6.2 g/dL (ref 6.1–8.1)

## 2016-01-25 LAB — BASIC METABOLIC PANEL WITH GFR
BUN: 8 mg/dL (ref 7–25)
CALCIUM: 9 mg/dL (ref 8.6–10.4)
CHLORIDE: 100 mmol/L (ref 98–110)
CO2: 23 mmol/L (ref 20–31)
CREATININE: 1.13 mg/dL — AB (ref 0.60–0.93)
GFR, Est African American: 56 mL/min — ABNORMAL LOW (ref >=60)
GFR, Est Non African American: 49 mL/min — ABNORMAL LOW (ref >=60)
Glucose, Bld: 72 mg/dL (ref 65–99)
Potassium: 4.5 mmol/L (ref 3.5–5.3)
SODIUM: 132 mmol/L — AB (ref 135–146)

## 2016-01-25 LAB — TSH: TSH: 1.98 mIU/L

## 2016-01-25 MED ORDER — MAGNESIUM 500 MG PO TABS: ORAL_TABLET | ORAL | 11 refills | Status: DC

## 2016-01-25 MED ORDER — B COMPLEX PO TABS: 1.0000 | ORAL_TABLET | Freq: Every day | ORAL | Status: AC

## 2016-01-25 NOTE — Patient Instructions (Addendum)
Please start taking 500 mg of magnesium twice daily.  This is available over the counter.  This is to help prevent muscle cramps.  Please start taking slow release iron once daily.    Please take B complex vitamin once daily.    Please increase your water intake to 3-4 water bottles per day in addition to your cokes.  This will help you urinate better.    Try going to affordable dentures.  There are multiple locations here in [place].  They pull teeth and also can help to make your denture plate.

## 2016-01-25 NOTE — Progress Notes (Signed)
Assessment and Plan:  Hypertension:  -Continue medication,  -monitor blood pressure at home.  -Continue DASH diet.   -Reminder to go to the ER if any CP, SOB, nausea, dizziness, severe HA, changes vision/speech, left arm numbness and tingling, and jaw pain.  Cholesterol: -Continue diet and exercise.  -Check cholesterol.   Pre-diabetes: -Continue diet and exercise.  -Check A1C  Vitamin D Def: -check level -continue medications.   GERD -reassure she is not on PPI -cont ranitidine  COPD -cont inhalers -seems to be relatively well controlled at this time.  -CKD -encouraged hydration as patient only drinks pop and never drinks water  Anemia -cont monitoring CBC  Hyponatremia -BMET -increase salt intake -possible poor intake vs. Psychogenic polydipsia vs. Early SIADH -volume neutral on exam  Continue diet and meds as discussed. Further disposition pending results of labs.  HPI 70 y.o. female  presents for 3 month follow up with hypertension, hyperlipidemia, prediabetes and vitamin D.   Her blood pressure has been controlled at home, today their BP is BP: 134/70.   She does not workout. She denies chest pain, shortness of breath, dizziness.   She is on cholesterol medication and denies myalgias. Her cholesterol is at goal. The cholesterol last visit was:   Lab Results  Component Value Date   CHOL 117 (L) 10/25/2015   HDL 51 10/25/2015   LDLCALC 34 10/25/2015   TRIG 158 (H) 10/25/2015   CHOLHDL 2.3 10/25/2015     She has been working on diet and exercise for prediabetes, and denies foot ulcerations, hyperglycemia, hypoglycemia , increased appetite, nausea, paresthesia of the feet, polydipsia, polyuria, visual disturbances, vomiting and weight loss. Last A1C in the office was:  Lab Results  Component Value Date   HGBA1C 5.6 10/25/2015    Patient is on Vitamin D supplement.  Lab Results  Component Value Date   VD25OH 92 10/25/2015     She reports that she has  several questions about tv.  She was worried about protonix which she is not even taking at this time.  She is taking ranitidine.    She did get her flu shot.    She wants to know what we think about theraworks for leg cramping.  She reports that she is getting leg cramps.  She reports that she walks when she gets the cramps and then it goes away.  She reports that she is getting back up.    She reports that she is not drinking any water per her report.    Current Medications:  Current Outpatient Prescriptions on File Prior to Visit  Medication Sig Dispense Refill  . aspirin EC 81 MG tablet Take 81 mg by mouth daily.    . cholecalciferol (VITAMIN D) 1000 UNITS tablet Take 5,000 Units by mouth daily.    Marland Kitchen gabapentin (NEURONTIN) 600 MG tablet TAKE 1/2 TO 1 TABLET BY MOUTH THREE TIMES DAILY AS DIRECTED 270 tablet 0  . LORazepam (ATIVAN) 1 MG tablet TAKE 1/2 TO 1 TABLET BY MOUTH THREE TIMES DAILY AS NEEDED FOR ANXIETY OR HOT FLASHES 270 tablet 1  . losartan (COZAAR) 100 MG tablet TAKE 1 TABLET BY MOUTH EVERY DAY FOR BLOOD PRESSURE 90 tablet 1  . mirtazapine (REMERON) 45 MG tablet TAKE 1 TABLET BY MOUTH EVERY NIGHT AT BEDTIME 90 tablet 1  . montelukast (SINGULAIR) 10 MG tablet TAKE 1 TABLET BY MOUTH ONCE DAILY 90 tablet 0  . OVER THE COUNTER MEDICATION Takes OTC Benadryl 3 tabs at hs    .  oxybutynin (DITROPAN) 5 MG tablet TAKE 1/2 TO 1 TABLET BY MOUTH THREE TIMES DAILY FOR BLADDER CONTROL 270 tablet 0  . QUEtiapine (SEROQUEL) 400 MG tablet TAKE 1 TABLET BY MOUTH EVERY NIGHT AT BEDTIME 90 tablet 1  . quinapril (ACCUPRIL) 40 MG tablet TAKE 1 TABLET BY MOUTH AT BEDTIME. 90 tablet 1  . ranitidine (ZANTAC) 300 MG tablet TAKE 1 TABLET(300 MG) BY MOUTH TWICE DAILY 180 tablet 1  . traMADol (ULTRAM) 50 MG tablet TAKE 1 TABLET BY MOUTH TWICE DAILY AS NEEDED 60 tablet 0  . vitamin C (ASCORBIC ACID) 500 MG tablet Take 500 mg by mouth daily.     No current facility-administered medications on file prior to  visit.     Medical History:  Past Medical History:  Diagnosis Date  . Carotid artery aneurysm ([place]) 03/04/2012  . COPD (chronic obstructive pulmonary disease) ([place])   . Depression   . DJD (degenerative joint disease)   . GERD (gastroesophageal reflux disease)   . Hypertension   . Stroke [facility])    was on Plavix - not now    Allergies:  Allergies  Allergen Reactions  . Amoxapine And Related Nausea And Vomiting  . Amoxicillin     itching  . Ciprocinonide [Fluocinolone]     Pt unsure  . Ciprofloxacin     Pt unsure  . Doxycycline Nausea And Vomiting  . Erythromycin Hives  . Sulfa Antibiotics Other (See Comments)    Unknown-reaction as a child  . Zofran [Ondansetron Hcl] Nausea And Vomiting     Review of Systems:  Review of Systems  Constitutional: Negative for chills, fever and malaise/fatigue.  HENT: Negative for congestion, ear pain and sore throat.   Eyes: Negative.   Respiratory: Negative for cough, shortness of breath and wheezing.   Cardiovascular: Negative for chest pain, palpitations and leg swelling.  Gastrointestinal: Negative for abdominal pain, blood in stool, constipation, diarrhea, heartburn and melena.  Genitourinary: Negative.   Musculoskeletal: Positive for myalgias.  Skin: Negative.   Neurological: Negative for dizziness, sensory change, loss of consciousness and headaches.  Psychiatric/Behavioral: Negative for depression. The patient is not nervous/anxious and does not have insomnia.     Family history- Review and unchanged  Social history- Review and unchanged  Physical Exam: BP 134/70   Pulse 88   Temp 98 F (36.7 C) (Temporal)   Resp 18   Ht 5' (1.524 m)   Wt 104 lb (47.2 kg)   BMI 20.31 kg/m  Wt Readings from Last 3 Encounters:  01/25/16 104 lb (47.2 kg)  10/25/15 107 lb 9.6 oz (48.8 kg)  07/07/15 110 lb (49.9 kg)    General Appearance: Well nourished well developed, in no apparent distress. Eyes: PERRLA, EOMs, conjunctiva no  swelling or erythema ENT/Mouth: Ear canals normal without obstruction, swelling, erythma, discharge.  TMs normal bilaterally.  Oropharynx moist, clear, without exudate, or postoropharyngeal swelling. Neck: Supple, thyroid normal,no cervical adenopathy  Respiratory: Respiratory effort normal, Breath sounds clear A&P without rhonchi, wheeze, or rale.  No retractions, no accessory usage. Cardio: RRR with no MRGs. Brisk peripheral pulses without edema.  Abdomen: Soft, + BS,  Non tender, no guarding, rebound, hernias, masses. Musculoskeletal: Full ROM, 5/5 strength, Normal gait Skin: Warm, dry without rashes, lesions, ecchymosis.  Neuro: Awake and oriented X 3, Cranial nerves intact. Normal muscle tone, no cerebellar symptoms. Psych: Anxious appearing affect, Insight and Judgment somewhat poor, patient very inattentive.    Starlyn Skeans, PA-C 2:42 PM [facility]  [facility] Medicine

## 2016-01-26 ENCOUNTER — Other Ambulatory Visit: Payer: Self-pay | Admitting: Internal Medicine

## 2016-01-26 LAB — HEMOGLOBIN A1C
HEMOGLOBIN A1C: 5 % (ref <5.7)
MEAN PLASMA GLUCOSE: 97 mg/dL

## 2016-02-10 ENCOUNTER — Other Ambulatory Visit: Payer: Self-pay | Admitting: Internal Medicine

## 2016-02-10 ENCOUNTER — Other Ambulatory Visit: Payer: Self-pay | Admitting: Physician Assistant

## 2016-02-10 DIAGNOSIS — M549 Dorsalgia, unspecified: Secondary | ICD-10-CM

## 2016-02-26 ENCOUNTER — Inpatient Hospital Stay (HOSPITAL_COMMUNITY): Payer: Commercial Managed Care - HMO

## 2016-02-26 ENCOUNTER — Emergency Department (HOSPITAL_COMMUNITY): Payer: Commercial Managed Care - HMO

## 2016-02-26 ENCOUNTER — Encounter (HOSPITAL_COMMUNITY): Payer: Self-pay | Admitting: Emergency Medicine

## 2016-02-26 ENCOUNTER — Inpatient Hospital Stay (HOSPITAL_COMMUNITY)
Admission: EM | Admit: 2016-02-26 | Discharge: 2016-02-29 | DRG: 064 | Disposition: A | Discharge status: Skilled Nursing Facility | Payer: Commercial Managed Care - HMO | Attending: Internal Medicine | Admitting: Internal Medicine

## 2016-02-26 DIAGNOSIS — I63412 Cerebral infarction due to embolism of left middle cerebral artery: Secondary | ICD-10-CM | POA: Diagnosis not present

## 2016-02-26 DIAGNOSIS — F0390 Unspecified dementia without behavioral disturbance: Secondary | ICD-10-CM | POA: Diagnosis present

## 2016-02-26 DIAGNOSIS — I672 Cerebral atherosclerosis: Secondary | ICD-10-CM | POA: Diagnosis present

## 2016-02-26 DIAGNOSIS — I129 Hypertensive chronic kidney disease with stage 1 through stage 4 chronic kidney disease, or unspecified chronic kidney disease: Secondary | ICD-10-CM | POA: Diagnosis present

## 2016-02-26 DIAGNOSIS — M6281 Muscle weakness (generalized): Secondary | ICD-10-CM | POA: Diagnosis not present

## 2016-02-26 DIAGNOSIS — M810 Age-related osteoporosis without current pathological fracture: Secondary | ICD-10-CM | POA: Diagnosis not present

## 2016-02-26 DIAGNOSIS — F17210 Nicotine dependence, cigarettes, uncomplicated: Secondary | ICD-10-CM | POA: Diagnosis present

## 2016-02-26 DIAGNOSIS — I6602 Occlusion and stenosis of left middle cerebral artery: Secondary | ICD-10-CM | POA: Diagnosis not present

## 2016-02-26 DIAGNOSIS — I69320 Aphasia following cerebral infarction: Secondary | ICD-10-CM | POA: Diagnosis not present

## 2016-02-26 DIAGNOSIS — Z79899 Other long term (current) drug therapy: Secondary | ICD-10-CM

## 2016-02-26 DIAGNOSIS — K219 Gastro-esophageal reflux disease without esophagitis: Secondary | ICD-10-CM | POA: Diagnosis present

## 2016-02-26 DIAGNOSIS — Z882 Allergy status to sulfonamides status: Secondary | ICD-10-CM | POA: Diagnosis not present

## 2016-02-26 DIAGNOSIS — I720 Aneurysm of carotid artery: Secondary | ICD-10-CM | POA: Diagnosis not present

## 2016-02-26 DIAGNOSIS — I63312 Cerebral infarction due to thrombosis of left middle cerebral artery: Secondary | ICD-10-CM

## 2016-02-26 DIAGNOSIS — R4701 Aphasia: Secondary | ICD-10-CM | POA: Diagnosis not present

## 2016-02-26 DIAGNOSIS — J449 Chronic obstructive pulmonary disease, unspecified: Secondary | ICD-10-CM | POA: Diagnosis present

## 2016-02-26 DIAGNOSIS — I6340 Cerebral infarction due to embolism of unspecified cerebral artery: Secondary | ICD-10-CM | POA: Insufficient documentation

## 2016-02-26 DIAGNOSIS — M1990 Unspecified osteoarthritis, unspecified site: Secondary | ICD-10-CM | POA: Diagnosis present

## 2016-02-26 DIAGNOSIS — I10 Essential (primary) hypertension: Secondary | ICD-10-CM | POA: Diagnosis not present

## 2016-02-26 DIAGNOSIS — M549 Dorsalgia, unspecified: Secondary | ICD-10-CM

## 2016-02-26 DIAGNOSIS — Z888 Allergy status to other drugs, medicaments and biological substances status: Secondary | ICD-10-CM

## 2016-02-26 DIAGNOSIS — N183 Chronic kidney disease, stage 3 (moderate): Secondary | ICD-10-CM | POA: Diagnosis present

## 2016-02-26 DIAGNOSIS — G9340 Encephalopathy, unspecified: Secondary | ICD-10-CM | POA: Diagnosis not present

## 2016-02-26 DIAGNOSIS — I639 Cerebral infarction, unspecified: Secondary | ICD-10-CM

## 2016-02-26 DIAGNOSIS — I6789 Other cerebrovascular disease: Secondary | ICD-10-CM | POA: Diagnosis not present

## 2016-02-26 DIAGNOSIS — I6522 Occlusion and stenosis of left carotid artery: Secondary | ICD-10-CM | POA: Diagnosis present

## 2016-02-26 DIAGNOSIS — Z8673 Personal history of transient ischemic attack (TIA), and cerebral infarction without residual deficits: Secondary | ICD-10-CM

## 2016-02-26 DIAGNOSIS — R4781 Slurred speech: Secondary | ICD-10-CM | POA: Diagnosis not present

## 2016-02-26 DIAGNOSIS — E785 Hyperlipidemia, unspecified: Secondary | ICD-10-CM | POA: Diagnosis not present

## 2016-02-26 DIAGNOSIS — G9389 Other specified disorders of brain: Secondary | ICD-10-CM | POA: Diagnosis not present

## 2016-02-26 DIAGNOSIS — R079 Chest pain, unspecified: Secondary | ICD-10-CM | POA: Diagnosis not present

## 2016-02-26 DIAGNOSIS — R1312 Dysphagia, oropharyngeal phase: Secondary | ICD-10-CM | POA: Diagnosis not present

## 2016-02-26 DIAGNOSIS — R471 Dysarthria and anarthria: Secondary | ICD-10-CM | POA: Diagnosis present

## 2016-02-26 DIAGNOSIS — F329 Major depressive disorder, single episode, unspecified: Secondary | ICD-10-CM | POA: Diagnosis not present

## 2016-02-26 DIAGNOSIS — M545 Low back pain: Secondary | ICD-10-CM | POA: Diagnosis not present

## 2016-02-26 DIAGNOSIS — R52 Pain, unspecified: Secondary | ICD-10-CM

## 2016-02-26 DIAGNOSIS — Z7982 Long term (current) use of aspirin: Secondary | ICD-10-CM

## 2016-02-26 LAB — CBC
HEMATOCRIT: 36.7 % (ref 36.0–46.0)
Hemoglobin: 12.3 g/dL (ref 12.0–15.0)
MCH: 31.9 pg (ref 26.0–34.0)
MCHC: 33.5 g/dL (ref 30.0–36.0)
MCV: 95.1 fL (ref 78.0–100.0)
PLATELETS: 281 10*3/uL (ref 150–400)
RBC: 3.86 MIL/uL — ABNORMAL LOW (ref 3.87–5.11)
RDW: 13.4 % (ref 11.5–15.5)
WBC: 8.6 10*3/uL (ref 4.0–10.5)

## 2016-02-26 LAB — DIFFERENTIAL
BASOS PCT: 1 %
Basophils Absolute: 0.1 10*3/uL (ref 0.0–0.1)
EOS PCT: 4 %
Eosinophils Absolute: 0.3 10*3/uL (ref 0.0–0.7)
Lymphocytes Relative: 34 %
Lymphs Abs: 2.9 10*3/uL (ref 0.7–4.0)
MONO ABS: 1 10*3/uL (ref 0.1–1.0)
MONOS PCT: 12 %
NEUTROS ABS: 4.3 10*3/uL (ref 1.7–7.7)
Neutrophils Relative %: 49 %

## 2016-02-26 LAB — COMPREHENSIVE METABOLIC PANEL
ALK PHOS: 60 U/L (ref 38–126)
ALT: 10 U/L — AB (ref 14–54)
ANION GAP: 9 (ref 5–15)
AST: 22 U/L (ref 15–41)
Albumin: 3.5 g/dL (ref 3.5–5.0)
BUN: 9 mg/dL (ref 6–20)
CALCIUM: 9.3 mg/dL (ref 8.9–10.3)
CHLORIDE: 102 mmol/L (ref 101–111)
CO2: 22 mmol/L (ref 22–32)
CREATININE: 1.13 mg/dL — AB (ref 0.44–1.00)
GFR, EST AFRICAN AMERICAN: 55 mL/min — AB (ref >60)
GFR, EST NON AFRICAN AMERICAN: 47 mL/min — AB (ref >60)
Glucose, Bld: 75 mg/dL (ref 65–99)
Potassium: 4.8 mmol/L (ref 3.5–5.1)
Sodium: 133 mmol/L — ABNORMAL LOW (ref 135–145)
Total Bilirubin: 0.3 mg/dL (ref 0.3–1.2)
Total Protein: 6.2 g/dL — ABNORMAL LOW (ref 6.5–8.1)

## 2016-02-26 LAB — APTT: aPTT: 33 seconds (ref 24–36)

## 2016-02-26 LAB — PROTIME-INR
INR: 0.98
PROTHROMBIN TIME: 13 s (ref 11.4–15.2)

## 2016-02-26 MED ORDER — SODIUM CHLORIDE 0.9 % IV SOLN
INTRAVENOUS | Status: DC
  Administered 2016-02-26: via INTRAVENOUS

## 2016-02-26 MED ORDER — STROKE: EARLY STAGES OF RECOVERY BOOK
Freq: Once | Status: AC
  Administered 2016-02-26
  Filled 2016-02-26: qty 1

## 2016-02-26 MED ORDER — ASPIRIN 300 MG RE SUPP: 300.0000 mg | Freq: Once | RECTAL | Status: DC

## 2016-02-26 MED ORDER — ASPIRIN 325 MG PO TABS
325.0000 mg | ORAL_TABLET | Freq: Every day | ORAL | Status: DC
  Administered 2016-02-28 – 2016-02-29 (×2): 325 mg via ORAL
  Filled 2016-02-26 (×2): qty 1

## 2016-02-26 MED ORDER — ENOXAPARIN SODIUM 40 MG/0.4ML ~~LOC~~ SOLN
40.0000 mg | SUBCUTANEOUS | Status: DC
  Administered 2016-02-27 – 2016-02-29 (×3): 40 mg via SUBCUTANEOUS
  Filled 2016-02-26 (×4): qty 0.4

## 2016-02-26 MED ORDER — ASPIRIN 300 MG RE SUPP
300.0000 mg | Freq: Every day | RECTAL | Status: DC
  Administered 2016-02-26 – 2016-02-27 (×2): 300 mg via RECTAL
  Filled 2016-02-26 (×2): qty 1

## 2016-02-26 NOTE — H&P (Signed)
History and Physical    MCKYNZIE COWEN [id number] DOB: 11-Mar-1946 DOA: 02/26/2016   PCP: Alesia Richards, MD Chief Complaint:  Chief Complaint  Patient presents with  . Aphasia    HPI: KARLETTE CANTWELL is a 70 y.o. female with medical history significant of prior stroke (on CT scans going back to 2004), HTN, carotid artery aneurysm.  Patient LKW yesterday as far as we can tell.  Daughter called patient at 5pm today like they do every day to talk.  Patient wasn't making sense on phone, daughter became concerned for possible stroke and called friend who came, saw patient, and called EMS.  Patient not really able to provide much history due to aphasia (seems like a receptive aphasia to me).  Review of Systems: Unable to perform due to aphasia.   Past Medical History:  Diagnosis Date  . Carotid artery aneurysm ([place]) 03/04/2012  . COPD (chronic obstructive pulmonary disease) ([place])   . Depression   . DJD (degenerative joint disease)   . GERD (gastroesophageal reflux disease)   . Hypertension   . Stroke [facility])    was on Plavix - not now    Past Surgical History:  Procedure Laterality Date  . CATARACT EXTRACTION Bilateral   . COLONOSCOPY  2004  . HERNIA REPAIR Right 2014   inguinal  . KYPHOPLASTY N/A 10/28/2014   Procedure: KYPHOPLASTY;  Surgeon: Phylliss Bob, MD;  Location: [place];  Service: Orthopedics;  Laterality: N/A;  Lumbar 1 kyphoplasty  . MICROLARYNGOSCOPY WITH CO2 LASER AND EXCISION OF VOCAL CORD LESION N/A 06/05/2013   Procedure: MICROLARYNGOSCOPY WITH CO2 LASER AND EXCISION OF VOCAL CORD POLYPS;  Surgeon: Melida Quitter, MD;  Location: [place];  Service: ENT;  Laterality: N/A;  . TONSILLECTOMY    . TUBAL LIGATION  30 years  . VIDEO BRONCHOSCOPY  04/24/2012   Procedure: VIDEO BRONCHOSCOPY WITHOUT FLUORO;  Surgeon: Rigoberto Noel, MD;  Location: [place];  Service: Cardiopulmonary;  Laterality: Bilateral;     reports that she has been smoking Cigarettes.  She has a  7.50 pack-year smoking history. She has never used smokeless tobacco. She reports that she does not drink alcohol or use drugs.  Allergies  Allergen Reactions  . Amoxapine And Related Nausea And Vomiting  . Amoxicillin     itching  . Ciprocinonide [Fluocinolone]     Pt unsure  . Ciprofloxacin     Pt unsure  . Doxycycline Nausea And Vomiting  . Erythromycin Hives  . Sulfa Antibiotics Other (See Comments)    Unknown-reaction as a child  . Zofran [Ondansetron Hcl] Nausea And Vomiting    Family History  Problem Relation Age of Onset  . Breast cancer Mother   . Breast cancer Sister       Prior to Admission medications   Medication Sig Start Date End Date Taking? Authorizing Provider  aspirin EC 81 MG tablet Take 81 mg by mouth daily.    Historical Provider, MD  b complex vitamins tablet Take 1 tablet by mouth daily. 01/25/16   Courtney Forcucci, PA-C  cholecalciferol (VITAMIN D) 1000 UNITS tablet Take 5,000 Units by mouth daily.    Historical Provider, MD  gabapentin (NEURONTIN) 600 MG tablet TAKE 1/2 TO 1 TABLET BY MOUTH THREE TIMES DAILY AS DIRECTED 01/13/16   Unk Pinto, MD  LORazepam (ATIVAN) 1 MG tablet TAKE 1/2 TO 1 TABLET BY MOUTH THREE TIMES DAILY AS NEEDED FOR ANXIETY OR HOT FLASHES 02/10/16   Vicie Mutters, PA-C  losartan (COZAAR)  100 MG tablet TAKE 1 TABLET BY MOUTH EVERY DAY FOR BLOOD PRESSURE 11/11/15   Unk Pinto, MD  Magnesium 500 MG TABS Take 500 mg tablet twice daily with meals. 01/25/16   Courtney Forcucci, PA-C  mirtazapine (REMERON) 45 MG tablet TAKE 1 TABLET BY MOUTH EVERY NIGHT AT BEDTIME 02/10/16   Vicie Mutters, PA-C  montelukast (SINGULAIR) 10 MG tablet TAKE 1 TABLET BY MOUTH EVERY DAY 02/10/16   Vicie Mutters, PA-C  OVER THE COUNTER MEDICATION Takes OTC Benadryl 3 tabs at hs    Historical Provider, MD  oxybutynin (DITROPAN) 5 MG tablet TAKE 1/2 TO 1 TABLET BY MOUTH THREE TIMES DAILY FOR BLADDER CONTROL 01/23/16   Unk Pinto, MD  QUEtiapine  (SEROQUEL) 400 MG tablet TAKE 1 TABLET BY MOUTH EVERY NIGHT AT BEDTIME 10/15/15   Unk Pinto, MD  quinapril (ACCUPRIL) 40 MG tablet TAKE 1 TABLET BY MOUTH AT BEDTIME. 02/10/16   Vicie Mutters, PA-C  ranitidine (ZANTAC) 300 MG tablet TAKE 1 TABLET(300 MG) BY MOUTH TWICE DAILY 11/11/15   Unk Pinto, MD  traMADol (ULTRAM) 50 MG tablet TAKE 1 TABLET BY MOUTH TWICE DAILY AS NEEDED 02/10/16   Vicie Mutters, PA-C  vitamin C (ASCORBIC ACID) 500 MG tablet Take 500 mg by mouth daily.    Historical Provider, MD    Physical Exam: Vitals:   02/26/16 1947 02/26/16 2000 02/26/16 2015 02/26/16 2100  BP:  159/76 158/76 170/90  Pulse:  62 (!) 59 63  Resp:  10 13 15   Temp: 98.1 F (36.7 C)     TempSrc:      SpO2:  98% 100% 97%  Weight:          Constitutional: NAD, calm, comfortable Eyes: PERRL, lids and conjunctivae normal ENMT: Mucous membranes are moist. Posterior pharynx clear of any exudate or lesions.Normal dentition.  Neck: normal, supple, no masses, no thyromegaly Respiratory: clear to auscultation bilaterally, no wheezing, no crackles. Normal respiratory effort. No accessory muscle use.  Cardiovascular: Regular rate and rhythm, no murmurs / rubs / gallops. No extremity edema. 2+ pedal pulses. No carotid bruits.  Abdomen: no tenderness, no masses palpated. No hepatosplenomegaly. Bowel sounds positive.  Musculoskeletal: no clubbing / cyanosis. No joint deformity upper and lower extremities. Good ROM, no contractures. Normal muscle tone.  Skin: no rashes, lesions, ulcers. No induration Neurologic: Patient appears to have primarily a receptive aphasia, difficulty following commands. Psychiatric: Unable to assess   Labs on Admission: I have personally reviewed following labs and imaging studies  CBC:  Recent Labs Lab 02/26/16 1910  WBC 8.6  NEUTROABS 4.3  HGB 12.3  HCT 36.7  MCV 95.1  PLT [id number]   Basic Metabolic Panel:  Recent Labs Lab 02/26/16 1910  NA 133*  K 4.8    CL 102  CO2 22  GLUCOSE 75  BUN 9  CREATININE 1.13*  CALCIUM 9.3   GFR: Estimated Creatinine Clearance: 32.3 mL/min (by C-G formula based on SCr of 1.13 mg/dL (H)). Liver Function Tests:  Recent Labs Lab 02/26/16 1910  AST 22  ALT 10*  ALKPHOS 60  BILITOT 0.3  PROT 6.2*  ALBUMIN 3.5   No results for input(s): LIPASE, AMYLASE in the last 168 hours. No results for input(s): AMMONIA in the last 168 hours. Coagulation Profile:  Recent Labs Lab 02/26/16 1910  INR 0.98   Cardiac Enzymes: No results for input(s): CKTOTAL, CKMB, CKMBINDEX, TROPONINI in the last 168 hours. BNP (last 3 results) No results for input(s): PROBNP in the last  8760 hours. HbA1C: No results for input(s): HGBA1C in the last 72 hours. CBG: No results for input(s): GLUCAP in the last 168 hours. Lipid Profile: No results for input(s): CHOL, HDL, LDLCALC, TRIG, CHOLHDL, LDLDIRECT in the last 72 hours. Thyroid Function Tests: No results for input(s): TSH, T4TOTAL, FREET4, T3FREE, THYROIDAB in the last 72 hours. Anemia Panel: No results for input(s): VITAMINB12, FOLATE, FERRITIN, TIBC, IRON, RETICCTPCT in the last 72 hours. Urine analysis:    Component Value Date/Time   COLORURINE YELLOW 07/07/2015 [place] 07/07/2015 1542   LABSPEC 1.010 07/07/2015 1542   PHURINE 6.0 07/07/2015 1542   GLUCOSEU NEGATIVE 07/07/2015 1542   HGBUR NEGATIVE 07/07/2015 1542   BILIRUBINUR NEGATIVE 07/07/2015 1542   KETONESUR NEGATIVE 07/07/2015 1542   PROTEINUR NEGATIVE 07/07/2015 1542   UROBILINOGEN 0.2 10/26/2014 1503   NITRITE NEGATIVE 07/07/2015 1542   LEUKOCYTESUR NEGATIVE 07/07/2015 1542   Sepsis Labs: @LABRCNTIP (procalcitonin:4,lacticidven:4) )No results found for this or any previous visit (from the past 240 hour(s)).   Radiological Exams on Admission: Ct Head Wo Contrast  Result Date: 02/26/2016 CLINICAL DATA:  Aphasia. EXAM: CT HEAD WITHOUT CONTRAST TECHNIQUE: Contiguous axial images  were obtained from the base of the skull through the vertex without intravenous contrast. COMPARISON:  MR brain 07/03/2013 and CT head 07/01/2013. FINDINGS: Brain: Difficult to exclude an age-indeterminate lacunar infarct in the left basal ganglia (series 2, image 14). No additional evidence of an acute infarct, acute hemorrhage, mass lesion, mass effect or hydrocephalus. Atrophy. Periventricular low attenuation. Encephalomalacia in the left parietal lobe. Vascular: No hyperdense vessel or unexpected calcification. Skull: Normal. Negative for fracture or focal lesion. Sinuses/Orbits: No acute finding. Other: None. IMPRESSION: 1. Difficult to exclude an age-indeterminate lacunar infarct in the left basal ganglia. Otherwise, no acute intracranial abnormality. 2. Atrophy and chronic microvascular white matter ischemic changes. 3. Left parietal lobe encephalomalacia. Electronically Signed   By: Lorin Picket M.D.   On: 02/26/2016 19:11    EKG: Independently reviewed.  Assessment/Plan Principal Problem:   Acute encephalopathy Active Problems:   HTN    1. Acute encephalopathy - Primarily receptive aphasia.  Suspicious for acute ischemic stroke. 1. Stroke pathway 2. MRI brain ordered 3. Neuro consult 4. PT/OT/SLP 5. Swallow eval (NPO until this done) 2. HTN - holding BP meds due to NPO status, monitor BP and treat if it gets too high.   DVT prophylaxis: Lovenox Code Status: Full Family Communication: Friend at bedside Consults called: Neurology Admission status: Admit to inpatient   Etta Quill DO [facility] Pager [phone] from 7PM-7AM  If 7AM-7PM, please contact the day physician for the patient [email or web address] Password [email or web address]  02/26/2016, 9:12 PM

## 2016-02-26 NOTE — ED Notes (Addendum)
Pt come in via EMS with a C/C of asphsia. Pt speech is incomprehensible and unable to form sentences. Initial NIH was an 8, modified NIH @ 2000hrs was a 2. Pt failed swallow screen due to a history of disphagia, order placed for an SLP. Current blood pressure 168/76, P-63, R-117, 98% RA, and sinus rhythm. Lovenox tubed up to the floor as pt no longer in department.   Pt currently in MRI, MRI staff will be bringing pt to the floor.

## 2016-02-26 NOTE — ED Notes (Signed)
Patient transported to CT 

## 2016-02-26 NOTE — ED Notes (Signed)
Patient transported to MRI 

## 2016-02-26 NOTE — ED Triage Notes (Signed)
Received pt from home with c/o LSN yesterday when she talked with her daughter by phone. Daughter called pt today about 40 mins PTA and noticed that pt was having a hard time talking.

## 2016-02-26 NOTE — Progress Notes (Signed)
Spoke with pt's Daughter [name] to clear pt for MRI ; ie double check her surgical history. She urged all staff to call with any questions or concerns. Number is listed in pt's snapshot.

## 2016-02-26 NOTE — ED Provider Notes (Signed)
[facility] DEPT Provider Note   CSN: [id number] Arrival date & time: 02/26/16  1805     History   Chief Complaint Chief Complaint  Patient presents with  . Aphasia    HPI [name] is a 70 y.o. female.   Cerebrovascular Accident  This is a new problem. The current episode started 12 to 24 hours ago. The problem occurs constantly. The problem has not changed since onset.Associated symptoms comments: Speech difficulty, confusion and trouble following commands. Nothing aggravates the symptoms. Nothing relieves the symptoms. She has tried nothing for the symptoms. The treatment provided no relief.    Past Medical History:  Diagnosis Date  . Carotid artery aneurysm ([place]) 03/04/2012  . COPD (chronic obstructive pulmonary disease) ([place])   . Depression   . DJD (degenerative joint disease)   . GERD (gastroesophageal reflux disease)   . Hypertension   . Stroke [facility])    was on Plavix - not now    Patient Active Problem List   Diagnosis Date Noted  . Acute encephalopathy 02/26/2016  . Osteoporosis 07/04/2015  . Impaired cognition 03/30/2014  . Vitamin D deficiency 08/18/2013  . Hyperlipidemia 08/18/2013  . Prediabetes 08/18/2013  . Medication management 08/18/2013  . CKD (chronic kidney disease) stage 3, GFR 30-59 ml/min 07/01/2013  . Anemia of chronic disease 07/01/2013  . Depression, major, in remission ([place]) 06/30/2013  . HTN 02/09/2013  . COPD GOLD III 03/28/2012  . Vocal cord dysfunction 03/28/2012  . Carotid artery aneurysm ([place]) 03/04/2012  . Smoker 03/04/2012    Past Surgical History:  Procedure Laterality Date  . CATARACT EXTRACTION Bilateral   . COLONOSCOPY  2004  . HERNIA REPAIR Right 2014   inguinal  . KYPHOPLASTY N/A 10/28/2014   Procedure: KYPHOPLASTY;  Surgeon: Phylliss Bob, MD;  Location: [place];  Service: Orthopedics;  Laterality: N/A;  Lumbar 1 kyphoplasty  . MICROLARYNGOSCOPY WITH CO2 LASER AND EXCISION OF VOCAL CORD LESION N/A 06/05/2013   Procedure: MICROLARYNGOSCOPY WITH CO2 LASER AND EXCISION OF VOCAL CORD POLYPS;  Surgeon: Melida Quitter, MD;  Location: [place];  Service: ENT;  Laterality: N/A;  . TONSILLECTOMY    . TUBAL LIGATION  30 years  . VIDEO BRONCHOSCOPY  04/24/2012   Procedure: VIDEO BRONCHOSCOPY WITHOUT FLUORO;  Surgeon: Rigoberto Noel, MD;  Location: [facility];  Service: Cardiopulmonary;  Laterality: Bilateral;    OB History    No data available       Home Medications    Prior to Admission medications   Medication Sig Start Date End Date Taking? Authorizing Provider  aspirin EC 81 MG tablet Take 81 mg by mouth daily.    Historical Provider, MD  b complex vitamins tablet Take 1 tablet by mouth daily. 01/25/16   Courtney Forcucci, PA-C  cholecalciferol (VITAMIN D) 1000 UNITS tablet Take 5,000 Units by mouth daily.    Historical Provider, MD  gabapentin (NEURONTIN) 600 MG tablet TAKE 1/2 TO 1 TABLET BY MOUTH THREE TIMES DAILY AS DIRECTED 01/13/16   Unk Pinto, MD  LORazepam (ATIVAN) 1 MG tablet TAKE 1/2 TO 1 TABLET BY MOUTH THREE TIMES DAILY AS NEEDED FOR ANXIETY OR HOT FLASHES 02/10/16   Vicie Mutters, PA-C  losartan (COZAAR) 100 MG tablet TAKE 1 TABLET BY MOUTH EVERY DAY FOR BLOOD PRESSURE 11/11/15   Unk Pinto, MD  Magnesium 500 MG TABS Take 500 mg tablet twice daily with meals. 01/25/16   Courtney Forcucci, PA-C  mirtazapine (REMERON) 45 MG tablet TAKE 1 TABLET BY MOUTH  EVERY NIGHT AT BEDTIME 02/10/16   Vicie Mutters, PA-C  montelukast (SINGULAIR) 10 MG tablet TAKE 1 TABLET BY MOUTH EVERY DAY 02/10/16   Vicie Mutters, PA-C  OVER THE COUNTER MEDICATION Takes OTC Benadryl 3 tabs at hs    Historical Provider, MD  oxybutynin (DITROPAN) 5 MG tablet TAKE 1/2 TO 1 TABLET BY MOUTH THREE TIMES DAILY FOR BLADDER CONTROL 01/23/16   Unk Pinto, MD  QUEtiapine (SEROQUEL) 400 MG tablet TAKE 1 TABLET BY MOUTH EVERY NIGHT AT BEDTIME 10/15/15   Unk Pinto, MD  quinapril (ACCUPRIL) 40 MG tablet TAKE 1 TABLET  BY MOUTH AT BEDTIME. 02/10/16   Vicie Mutters, PA-C  ranitidine (ZANTAC) 300 MG tablet TAKE 1 TABLET(300 MG) BY MOUTH TWICE DAILY 11/11/15   Unk Pinto, MD  traMADol (ULTRAM) 50 MG tablet TAKE 1 TABLET BY MOUTH TWICE DAILY AS NEEDED 02/10/16   Vicie Mutters, PA-C  vitamin C (ASCORBIC ACID) 500 MG tablet Take 500 mg by mouth daily.    Historical Provider, MD    Family History Family History  Problem Relation Age of Onset  . Breast cancer Mother   . Breast cancer Sister     Social History Social History  Substance Use Topics  . Smoking status: Current Every Day Smoker    Packs/day: 0.25    Years: 30.00    Types: Cigarettes  . Smokeless tobacco: Never Used  . Alcohol use No     Allergies   Amoxapine and related; Amoxicillin; Ciprocinonide [fluocinolone]; Ciprofloxacin; Doxycycline; Erythromycin; Sulfa antibiotics; and Zofran [ondansetron hcl]   Review of Systems Review of Systems  Unable to perform ROS: Acuity of condition     Physical Exam Updated Vital Signs BP 168/76   Pulse 63   Temp 98.1 F (36.7 C)   Resp 17   Wt 47.2 kg   SpO2 98%   BMI 20.31 kg/m   Physical Exam  Constitutional: She appears well-developed and well-nourished. No distress.  HENT:  Head: Normocephalic and atraumatic.  Eyes: Conjunctivae are normal.  Neck: Neck supple.  Cardiovascular: Normal rate and regular rhythm.   No murmur heard. Pulmonary/Chest: Effort normal and breath sounds normal. No respiratory distress.  Abdominal: Soft. There is no tenderness.  Musculoskeletal: She exhibits no edema.  Neurological: She is alert. She exhibits normal muscle tone. GCS eye subscore is 4. GCS verbal subscore is 2. GCS motor subscore is 6.  Patient is alert however not able to evaluate for orientation, but does seem confused as she is not following commands appropriately, incomprehensible verbal speech. No facial droop her obvious cranial nerve deformity. She does follow some commands she  doesn't appear to have any focal weaknesses.  Skin: Skin is warm and dry.  Psychiatric: She has a normal mood and affect.  Nursing note and vitals reviewed.    ED Treatments / Results  Labs (all labs ordered are listed, but only abnormal results are displayed) Labs Reviewed  CBC - Abnormal; Notable for the following:       Result Value   RBC 3.86 (*)    All other components within normal limits  COMPREHENSIVE METABOLIC PANEL - Abnormal; Notable for the following:    Sodium 133 (*)    Creatinine, Ser 1.13 (*)    Total Protein 6.2 (*)    ALT 10 (*)    GFR calc non Af Amer 47 (*)    GFR calc Af Amer 55 (*)    All other components within normal limits  PROTIME-INR  APTT  DIFFERENTIAL  URINALYSIS, ROUTINE W REFLEX MICROSCOPIC (NOT AT [facility])  HEMOGLOBIN A1C  LIPID PANEL  I-STAT TROPOININ, ED  CBG MONITORING, ED  I-STAT CHEM 8, ED    EKG  EKG Interpretation  Date/Time:  Sunday February 26 2016 18:16:56 EST Ventricular Rate:  68 PR Interval:    QRS Duration: 81 QT Interval:  397 QTC Calculation: 423 R Axis:   30 Text Interpretation:  Sinus rhythm Left atrial enlargement Low voltage, extremity leads No significant change since last tracing Confirmed by Winfred Leeds  MD, SAM [phone]) on 02/26/2016 6:40:16 PM       Radiology Ct Head Wo Contrast  Result Date: 02/26/2016 CLINICAL DATA:  Aphasia. EXAM: CT HEAD WITHOUT CONTRAST TECHNIQUE: Contiguous axial images were obtained from the base of the skull through the vertex without intravenous contrast. COMPARISON:  MR brain 07/03/2013 and CT head 07/01/2013. FINDINGS: Brain: Difficult to exclude an age-indeterminate lacunar infarct in the left basal ganglia (series 2, image 14). No additional evidence of an acute infarct, acute hemorrhage, mass lesion, mass effect or hydrocephalus. Atrophy. Periventricular low attenuation. Encephalomalacia in the left parietal lobe. Vascular: No hyperdense vessel or unexpected calcification. Skull:  Normal. Negative for fracture or focal lesion. Sinuses/Orbits: No acute finding. Other: None. IMPRESSION: 1. Difficult to exclude an age-indeterminate lacunar infarct in the left basal ganglia. Otherwise, no acute intracranial abnormality. 2. Atrophy and chronic microvascular white matter ischemic changes. 3. Left parietal lobe encephalomalacia. Electronically Signed   By: Lorin Picket M.D.   On: 02/26/2016 19:11   Dg Chest Port 1 View  Result Date: 02/26/2016 CLINICAL DATA:  Chest pain today. EXAM: PORTABLE CHEST 1 VIEW COMPARISON:  Or FINDINGS: The heart size and mediastinal contours are within normal limits. Aortic atherosclerosis. Both lungs are clear. No evidence of pneumothorax or pleural effusion. IMPRESSION: No active disease. Electronically Signed   By: Earle Gell M.D.   On: 02/26/2016 21:19    Procedures Procedures (including critical care time)  Medications Ordered in ED Medications   stroke: mapping our early stages of recovery book (not administered)  enoxaparin (LOVENOX) injection 40 mg (not administered)  aspirin suppository 300 mg (300 mg Rectal Given 02/26/16 2122)    Or  aspirin tablet 325 mg ( Oral See Alternative 02/26/16 2122)     Initial Impression / Assessment and Plan / ED Course  I have reviewed the triage vital signs and the nursing notes.  Pertinent labs & imaging results that were available during my care of the patient were reviewed by me and considered in my medical decision making (see chart for details).  Clinical Course    70 year old female with past medical history of TIA comes today with strokelike symptoms. She is disorganized speech is having trouble following commands. No focal cranial nerve deficits unable to appreciate any focal weaknesses or sensory deficits. Last known normal sometime yesterday afternoon, well out of the window for any intervention. CT scan shows age-indeterminate lacunar infarct left basal ganglia. EKG is unremarkable for any  ischemic changes interval upper body or arrhythmia. Laboratory analysis is unremarkable for any sort of contribution to her presentation. Patient did not pass her bedside swallow was given rectal aspirin. Patient will be admitted to the hospitalist team and have neurology consult as well. Vital signs stable time of handoff of care. Further made of the patient's note please see inpatient team notes.  Final Clinical Impressions(s) / ED Diagnoses   Final diagnoses:  Pain  Stroke [facility])  Stroke [facility])  Stroke ([place])  New Prescriptions New Prescriptions   No medications on file     Dewaine Conger, MD 02/26/16 [place], MD 02/27/16 0127

## 2016-02-26 NOTE — Progress Notes (Signed)
Patient is admitted from ED. Patient alert and oriented. Patient and friend oriented to room. Tele placed. Patient made comfortable.

## 2016-02-26 NOTE — Consult Note (Signed)
Admission H&P    Chief Complaint: new onset speech abnormality.  HPI: [name] is an 70 y.o. female with a history of previous stroke,, degenerative joint disease, COPD and carotid aneurysm, presenting with new onset speech abnormality. She was last known well on 02/25/16. She was noted to have nonsensical speech output by her daughter this afternoon, and was thought to probably be experiencing a recurrent stroke. CT scan of the head showed no acute intracranial abnormality. Old left parietal area of encephalomalacia was noted. MRI, in addition to old stroke, showed new areas of patchy acute left temporal parietal ischemic infarction. NIH stroke score was 4.  LSN: 02/25/2016 tPA Given: No: beyond time window for treatment consideration mRankin:  Past Medical History:  Diagnosis Date  . Carotid artery aneurysm ([place]) 03/04/2012  . COPD (chronic obstructive pulmonary disease) ([place])   . Depression   . DJD (degenerative joint disease)   . GERD (gastroesophageal reflux disease)   . Hypertension   . Stroke [facility])    was on Plavix - not now    Past Surgical History:  Procedure Laterality Date  . CATARACT EXTRACTION Bilateral   . COLONOSCOPY  2004  . HERNIA REPAIR Right 2014   inguinal  . KYPHOPLASTY N/A 10/28/2014   Procedure: KYPHOPLASTY;  Surgeon: Phylliss Bob, MD;  Location: [place];  Service: Orthopedics;  Laterality: N/A;  Lumbar 1 kyphoplasty  . MICROLARYNGOSCOPY WITH CO2 LASER AND EXCISION OF VOCAL CORD LESION N/A 06/05/2013   Procedure: MICROLARYNGOSCOPY WITH CO2 LASER AND EXCISION OF VOCAL CORD POLYPS;  Surgeon: Melida Quitter, MD;  Location: [place];  Service: ENT;  Laterality: N/A;  . TONSILLECTOMY    . TUBAL LIGATION  30 years  . VIDEO BRONCHOSCOPY  04/24/2012   Procedure: VIDEO BRONCHOSCOPY WITHOUT FLUORO;  Surgeon: Rigoberto Noel, MD;  Location: [place];  Service: Cardiopulmonary;  Laterality: Bilateral;    Family History  Problem Relation Age of Onset  . Breast cancer  Mother   . Breast cancer Sister    Social History:  reports that she has been smoking Cigarettes.  She has a 7.50 pack-year smoking history. She has never used smokeless tobacco. She reports that she does not drink alcohol or use drugs.  Allergies:  Allergies  Allergen Reactions  . Amoxapine And Related Nausea And Vomiting  . Amoxicillin     itching  . Ciprocinonide [Fluocinolone]     Pt unsure  . Ciprofloxacin     Pt unsure  . Doxycycline Nausea And Vomiting  . Erythromycin Hives  . Sulfa Antibiotics Other (See Comments)    Unknown-reaction as a child  . Zofran [Ondansetron Hcl] Nausea And Vomiting    Medications Prior to Admission  Medication Sig Dispense Refill  . aspirin EC 81 MG tablet Take 81 mg by mouth daily.    Marland Kitchen b complex vitamins tablet Take 1 tablet by mouth daily.    . cholecalciferol (VITAMIN D) 1000 UNITS tablet Take 5,000 Units by mouth daily.    Marland Kitchen gabapentin (NEURONTIN) 600 MG tablet TAKE 1/2 TO 1 TABLET BY MOUTH THREE TIMES DAILY AS DIRECTED 270 tablet 0  . LORazepam (ATIVAN) 1 MG tablet TAKE 1/2 TO 1 TABLET BY MOUTH THREE TIMES DAILY AS NEEDED FOR ANXIETY OR HOT FLASHES 270 tablet 0  . losartan (COZAAR) 100 MG tablet TAKE 1 TABLET BY MOUTH EVERY DAY FOR BLOOD PRESSURE 90 tablet 1  . Magnesium 500 MG TABS Take 500 mg tablet twice daily with meals. 100 tablet 11  .  mirtazapine (REMERON) 45 MG tablet TAKE 1 TABLET BY MOUTH EVERY NIGHT AT BEDTIME 90 tablet 0  . montelukast (SINGULAIR) 10 MG tablet TAKE 1 TABLET BY MOUTH EVERY DAY 90 tablet 0  . OVER THE COUNTER MEDICATION Takes OTC Benadryl 3 tabs at hs    . oxybutynin (DITROPAN) 5 MG tablet TAKE 1/2 TO 1 TABLET BY MOUTH THREE TIMES DAILY FOR BLADDER CONTROL 270 tablet 0  . QUEtiapine (SEROQUEL) 400 MG tablet TAKE 1 TABLET BY MOUTH EVERY NIGHT AT BEDTIME 90 tablet 1  . quinapril (ACCUPRIL) 40 MG tablet TAKE 1 TABLET BY MOUTH AT BEDTIME. 90 tablet 0  . ranitidine (ZANTAC) 300 MG tablet TAKE 1 TABLET(300 MG) BY MOUTH  TWICE DAILY 180 tablet 1  . traMADol (ULTRAM) 50 MG tablet TAKE 1 TABLET BY MOUTH TWICE DAILY AS NEEDED 60 tablet 0  . vitamin C (ASCORBIC ACID) 500 MG tablet Take 500 mg by mouth daily.      ROS: Unavailable due to patient's acute aphasia.  Physical Examination: Blood pressure (!) 143/74, pulse 66, temperature 98.2 F (36.8 C), temperature source Oral, resp. rate 16, weight 49.1 kg (108 lb 3.2 oz), SpO2 100 %.  HEENT-  Normocephalic, no lesions, without obvious abnormality.  Normal external eye and conjunctiva.  Normal TM's bilaterally.  Normal auditory canals and external ears. Normal external nose, mucus membranes and septum.  Normal pharynx. Neck supple with no masses, nodes, nodules or enlargement. Cardiovascular - regular rate and rhythm, S1, S2 normal, no murmur, click, rub or gallop Lungs - chest clear, no wheezing, rales, normal symmetric air entry Abdomen - soft, non-tender; bowel sounds normal; no masses,  no organomegaly Extremities - no joint deformities, effusion, or inflammation and no edema  Neurologic Examination: Mental Status: Alert, moderately severe receptive an expressive aphasia. Occasionally able to follow simple verbal commands. Speech output was essentially word salad. Cranial Nerves: II-Visual fields were normal to visual confrontation. III/IV/VI-Pupils were equal and reacted normally to light. Extraocular movements were full and conjugate.  V/VII-no facial numbness and no facial weakness. VIII-normal. X-no dysarthria; speech content nonsensical for the most part. XI: trapezius strength/neck flexion strength normal bilaterally XII-midline tongue extension with normal strength. Motor: 5/5 bilaterally with normal tone and bulk Sensory: Normal throughout. Deep Tendon Reflexes: 2+ and symmetric. Plantars: mute bilaterally Carotid auscultation: Normal  Results for orders placed or performed during the hospital encounter of 02/26/16 (from the past 48 hour(s))   Protime-INR     Status: None   Collection Time: 02/26/16  7:10 PM  Result Value Ref Range   Prothrombin Time 13.0 11.4 - 15.2 seconds   INR 0.98   APTT     Status: None   Collection Time: 02/26/16  7:10 PM  Result Value Ref Range   aPTT 33 24 - 36 seconds  CBC     Status: Abnormal   Collection Time: 02/26/16  7:10 PM  Result Value Ref Range   WBC 8.6 4.0 - 10.5 K/uL   RBC 3.86 (L) 3.87 - 5.11 MIL/uL   Hemoglobin 12.3 12.0 - 15.0 g/dL   HCT 36.7 36.0 - 46.0 %   MCV 95.1 78.0 - 100.0 fL   MCH 31.9 26.0 - 34.0 pg   MCHC 33.5 30.0 - 36.0 g/dL   RDW 13.4 11.5 - 15.5 %   Platelets 281 150 - 400 K/uL  Differential     Status: None   Collection Time: 02/26/16  7:10 PM  Result Value Ref Range   Neutrophils  Relative % 49 %   Neutro Abs 4.3 1.7 - 7.7 K/uL   Lymphocytes Relative 34 %   Lymphs Abs 2.9 0.7 - 4.0 K/uL   Monocytes Relative 12 %   Monocytes Absolute 1.0 0.1 - 1.0 K/uL   Eosinophils Relative 4 %   Eosinophils Absolute 0.3 0.0 - 0.7 K/uL   Basophils Relative 1 %   Basophils Absolute 0.1 0.0 - 0.1 K/uL  Comprehensive metabolic panel     Status: Abnormal   Collection Time: 02/26/16  7:10 PM  Result Value Ref Range   Sodium 133 (L) 135 - 145 mmol/L   Potassium 4.8 3.5 - 5.1 mmol/L   Chloride 102 101 - 111 mmol/L   CO2 22 22 - 32 mmol/L   Glucose, Bld 75 65 - 99 mg/dL   BUN 9 6 - 20 mg/dL   Creatinine, Ser 1.13 (H) 0.44 - 1.00 mg/dL   Calcium 9.3 8.9 - 10.3 mg/dL   Total Protein 6.2 (L) 6.5 - 8.1 g/dL   Albumin 3.5 3.5 - 5.0 g/dL   AST 22 15 - 41 U/L   ALT 10 (L) 14 - 54 U/L   Alkaline Phosphatase 60 38 - 126 U/L   Total Bilirubin 0.3 0.3 - 1.2 mg/dL   GFR calc non Af Amer 47 (L) >60 mL/min   GFR calc Af Amer 55 (L) >60 mL/min    Comment: (NOTE) The eGFR has been calculated using the CKD EPI equation. This calculation has not been validated in all clinical situations. eGFR's persistently <60 mL/min signify possible Chronic Kidney Disease.    Anion gap 9 5 - 15    Ct Head Wo Contrast  Result Date: 02/26/2016 CLINICAL DATA:  Aphasia. EXAM: CT HEAD WITHOUT CONTRAST TECHNIQUE: Contiguous axial images were obtained from the base of the skull through the vertex without intravenous contrast. COMPARISON:  MR brain 07/03/2013 and CT head 07/01/2013. FINDINGS: Brain: Difficult to exclude an age-indeterminate lacunar infarct in the left basal ganglia (series 2, image 14). No additional evidence of an acute infarct, acute hemorrhage, mass lesion, mass effect or hydrocephalus. Atrophy. Periventricular low attenuation. Encephalomalacia in the left parietal lobe. Vascular: No hyperdense vessel or unexpected calcification. Skull: Normal. Negative for fracture or focal lesion. Sinuses/Orbits: No acute finding. Other: None. IMPRESSION: 1. Difficult to exclude an age-indeterminate lacunar infarct in the left basal ganglia. Otherwise, no acute intracranial abnormality. 2. Atrophy and chronic microvascular white matter ischemic changes. 3. Left parietal lobe encephalomalacia. Electronically Signed   By: Lorin Picket M.D.   On: 02/26/2016 19:11   Mr Brain Wo Contrast  Addendum Date: 02/26/2016   ADDENDUM REPORT: 02/26/2016 23:44 ADDENDUM: Acute findings discussed with and reconfirmed by Dr.JARED GARDNER on 02/26/2016 at 11:40pm. Electronically Signed   By: Elon Alas M.D.   On: 02/26/2016 23:44   Result Date: 02/26/2016 CLINICAL DATA:  Altered speech, aphasic. History of stroke, hypertension, carotid artery aneurysm. EXAM: MRI HEAD WITHOUT CONTRAST MRA HEAD WITHOUT CONTRAST TECHNIQUE: Multiplanar, multiecho pulse sequences of the brain and surrounding structures were obtained without intravenous contrast. Angiographic images of the head were obtained using MRA technique without contrast. COMPARISON:  CT HEAD February 26, 2016 at 1858 hours and, MRI of the head July 03, 2013 and MRA head March 10, 2010 moderate stenosis in luminal irregularity RIGHT M1 segment. FINDINGS:  MRI HEAD FINDINGS BRAIN: Patchy reduced diffusion LEFT temporoparietal lobe extending to the periventricular white matter. 11 mm ovoid focus of reduced diffusion LEFT posterior corona radiata. Areas  of diffusion abnormality demonstrate low ADC values. Faint susceptibility artifact LEFT parietal lobe at site of old LEFT parietal lobe infarct with encephalomalacia. Old RIGHT thalamus lacunar infarct. Ex vacuo dilatation subjacent LEFT lateral ventricle, no hydrocephalus. No suspicious parenchymal signal, masses or mass effect. Similar patchy to confluent supratentorial pontine white matter FLAIR T2 hyperintensities. No abnormal extra-axial fluid collections. No extra-axial masses though, contrast enhanced sequences would be more sensitive. VASCULAR: Normal major intracranial vascular flow voids present at skull base. SKULL AND UPPER CERVICAL SPINE: No abnormal sellar expansion. No suspicious calvarial bone marrow signal. Craniocervical junction maintained. SINUSES/ORBITS: The mastoid air-cells and included paranasal sinuses are well-aerated. Status post bilateral ocular lens implants. The included ocular globes and orbital contents are non-suspicious. OTHER: Patient is edentulous. MRA HEAD FINDINGS- mild motion degraded examination. ANTERIOR CIRCULATION: Flow related enhancement of the included cervical, petrous, cavernous and supraclinoid internal carotid arteries. Moderate stenosis LEFT cavernous internal carotid artery and, moderate stenosis LEFT greater than RIGHT supraclinoid internal carotid arteries. 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 Moderate luminal irregularity of the anterior cerebral arteries. Patent anterior communicating artery. Moderate stenosis proximal LEFT A2 segment. Moderate stenosis distal LEFT M1 segment. LEFT M2 segment occlusion. Moderate tandem stenosis RIGHT M1 segment. POSTERIOR CIRCULATION:  RIGHT vertebral artery is dominant. Basilar artery is patent, with normal flow related enhancement of the main branch vessels. Normal flow related enhancement of the posterior cerebral arteries. Moderate luminal irregularity of the posterior cerebral arteries. No aneurysm. IMPRESSION: MRI HEAD: Acute patchy small LEFT temporal parietal/MCA territory nonhemorrhagic infarct. 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 Mild to moderate chronic small vessel ischemic disease. MRA HEAD: LEFT M2 occlusion may be acute. Moderate intracranial atherosclerosis. 2 mm LEFT cavernous/meningohypophyseal aneurysm better characterized on prior less motion degraded examination. Electronically Signed: By: Elon Alas M.D. On: 02/26/2016 23:32   Dg Chest Port 1 View  Result Date: 02/26/2016 CLINICAL DATA:  Chest pain today. EXAM: PORTABLE CHEST 1 VIEW COMPARISON:  Or FINDINGS: The heart size and mediastinal contours are within normal limits. Aortic atherosclerosis. Both lungs are clear. No evidence of pneumothorax or pleural effusion. IMPRESSION: No active disease. Electronically Signed   By: Earle Gell M.D.   On: 02/26/2016 21:19   Mr Jodene Nam Head/brain QP Cm  Addendum Date: 02/26/2016   ADDENDUM REPORT: 02/26/2016 23:44 ADDENDUM: Acute findings discussed with and reconfirmed by Dr.JARED GARDNER on 02/26/2016 at 11:40pm. Electronically Signed   By: Elon Alas M.D.   On: 02/26/2016 23:44   Result Date: 02/26/2016 CLINICAL DATA:  Altered speech, aphasic. History of stroke, hypertension, carotid artery aneurysm. EXAM: MRI HEAD WITHOUT CONTRAST MRA HEAD WITHOUT CONTRAST TECHNIQUE: Multiplanar, multiecho pulse sequences of the brain and surrounding structures were obtained without intravenous contrast. Angiographic images of the head were obtained using MRA technique without contrast. COMPARISON:  CT HEAD February 26, 2016 at 1858 hours and, MRI of the head July 03, 2013 and MRA head March 10, 2010 moderate stenosis in luminal irregularity RIGHT M1 segment. FINDINGS: MRI HEAD FINDINGS BRAIN: Patchy reduced diffusion LEFT temporoparietal lobe extending to the periventricular white matter. 11 mm ovoid focus of reduced diffusion LEFT posterior corona radiata. Areas of diffusion abnormality demonstrate low ADC values. Faint susceptibility artifact LEFT parietal lobe at site of old LEFT parietal lobe infarct with encephalomalacia. Old RIGHT thalamus lacunar infarct. Ex vacuo dilatation subjacent LEFT lateral ventricle, no hydrocephalus. No suspicious parenchymal signal, masses  or mass effect. Similar patchy to confluent supratentorial pontine white matter FLAIR T2 hyperintensities. No abnormal extra-axial fluid collections. No extra-axial masses though, contrast enhanced sequences would be more sensitive. VASCULAR: Normal major intracranial vascular flow voids present at skull base. SKULL AND UPPER CERVICAL SPINE: No abnormal sellar expansion. No suspicious calvarial bone marrow signal. Craniocervical junction maintained. SINUSES/ORBITS: The mastoid air-cells and included paranasal sinuses are well-aerated. Status post bilateral ocular lens implants. The included ocular globes and orbital contents are non-suspicious. OTHER: Patient is edentulous. MRA HEAD FINDINGS- mild motion degraded examination. ANTERIOR CIRCULATION: Flow related enhancement of the included cervical, petrous, cavernous and supraclinoid internal carotid arteries. Moderate stenosis LEFT cavernous internal carotid artery and, moderate stenosis LEFT greater than RIGHT supraclinoid internal carotid arteries. 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 Moderate luminal irregularity of the anterior cerebral arteries. Patent anterior communicating artery. Moderate stenosis proximal LEFT A2 segment. Moderate stenosis distal LEFT M1 segment. LEFT M2 segment  occlusion. Moderate tandem stenosis RIGHT M1 segment. POSTERIOR CIRCULATION: RIGHT vertebral artery is dominant. Basilar artery is patent, with normal flow related enhancement of the main branch vessels. Normal flow related enhancement of the posterior cerebral arteries. Moderate luminal irregularity of the posterior cerebral arteries. No aneurysm. IMPRESSION: MRI HEAD: Acute patchy small LEFT temporal parietal/MCA territory nonhemorrhagic infarct. 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 Mild to moderate chronic small vessel ischemic disease. MRA HEAD: LEFT M2 occlusion may be acute. Moderate intracranial atherosclerosis. 2 mm LEFT cavernous/meningohypophyseal aneurysm better characterized on prior less motion degraded examination. Electronically Signed: By: Elon Alas M.D. On: 02/26/2016 23:32    Assessment: 70 y.o. female with multiple risk factors for stroke presenting with acute left posterior MCA territory ischemic infarction.  Stroke Risk Factors - hypertension and carotid aneurysm, left MCA occlusion  Plan: 1. HgbA1c, fasting lipid panel 2. PT consult, OT consult, Speech consult 3. Echocardiogram 4. Carotid dopplers 5. Prophylactic therapy-Antiplatelet med: Aspirin  6. Risk factor modification 7. Telemetry monitoring  C.R. Nicole Kindred, MD [facility] [phone]  02/26/2016, 11:51 PM

## 2016-02-26 NOTE — ED Provider Notes (Signed)
Level V caveat patient noncommunicative. She was found by friend 5 PM today unable to speak. On exam patient is alert, moves all extremities, does not follow simple commands. Speech is incomprehensible. Lungs clear auscultation heart regular rate and rhythm nondistended nontender skin warm dry   Orlie Dakin, MD 02/27/16 0126

## 2016-02-27 ENCOUNTER — Inpatient Hospital Stay (HOSPITAL_COMMUNITY): Payer: Commercial Managed Care - HMO

## 2016-02-27 ENCOUNTER — Encounter (HOSPITAL_COMMUNITY): Payer: Commercial Managed Care - HMO

## 2016-02-27 ENCOUNTER — Encounter (HOSPITAL_COMMUNITY): Payer: Self-pay | Admitting: Radiology

## 2016-02-27 DIAGNOSIS — I6789 Other cerebrovascular disease: Secondary | ICD-10-CM

## 2016-02-27 DIAGNOSIS — I6340 Cerebral infarction due to embolism of unspecified cerebral artery: Secondary | ICD-10-CM | POA: Insufficient documentation

## 2016-02-27 LAB — URINALYSIS, ROUTINE W REFLEX MICROSCOPIC
BILIRUBIN URINE: NEGATIVE
GLUCOSE, UA: NEGATIVE mg/dL
HGB URINE DIPSTICK: NEGATIVE
Ketones, ur: NEGATIVE mg/dL
Leukocytes, UA: NEGATIVE
Nitrite: NEGATIVE
PROTEIN: NEGATIVE mg/dL
Specific Gravity, Urine: 1.009 (ref 1.005–1.030)
pH: 5.5 (ref 5.0–8.0)

## 2016-02-27 LAB — LIPID PANEL
CHOLESTEROL: 126 mg/dL (ref 0–200)
HDL: 54 mg/dL (ref >40)
LDL CALC: 56 mg/dL (ref 0–99)
Total CHOL/HDL Ratio: 2.3 RATIO
Triglycerides: 80 mg/dL (ref <150)
VLDL: 16 mg/dL (ref 0–40)

## 2016-02-27 LAB — ECHOCARDIOGRAM COMPLETE: WEIGHTICAEL: 1731.2 [oz_av]

## 2016-02-27 MED ORDER — MIRTAZAPINE 15 MG PO TABS
45.0000 mg | ORAL_TABLET | Freq: Every day | ORAL | Status: DC
  Administered 2016-02-27: 45 mg via ORAL
  Filled 2016-02-27 (×2): qty 3

## 2016-02-27 MED ORDER — MONTELUKAST SODIUM 10 MG PO TABS
10.0000 mg | ORAL_TABLET | Freq: Every day | ORAL | Status: DC
  Administered 2016-02-27 – 2016-02-29 (×3): 10 mg via ORAL
  Filled 2016-02-27 (×3): qty 1

## 2016-02-27 MED ORDER — FAMOTIDINE 20 MG PO TABS
20.0000 mg | ORAL_TABLET | Freq: Every day | ORAL | Status: DC
  Administered 2016-02-27 – 2016-02-29 (×3): 20 mg via ORAL
  Filled 2016-02-27 (×3): qty 1

## 2016-02-27 MED ORDER — IOPAMIDOL (ISOVUE-370) INJECTION 76%
INTRAVENOUS | Status: AC
  Administered 2016-02-27: 50 mL
  Filled 2016-02-27: qty 50

## 2016-02-27 MED ORDER — LORAZEPAM 0.5 MG PO TABS: 0.5000 mg | ORAL_TABLET | Freq: Two times a day (BID) | ORAL | Status: DC | PRN

## 2016-02-27 MED ORDER — IOPAMIDOL (ISOVUE-370) INJECTION 76%
INTRAVENOUS | Status: AC
  Filled 2016-02-27: qty 50

## 2016-02-27 NOTE — Progress Notes (Signed)
[facility] PROGRESS NOTE  SANTRESA BOHLINGER [id number] DOB: 04-28-1945 DOA: 02/26/2016   PCP: Alesia Richards, MD     Assessment/Plan: Principal Problem:   Acute encephalopathy Active Problems:   HTN    70 y.o. female with a history of previous stroke,, degenerative joint disease, COPD and carotid aneurysm, presenting with new onset speech abnormality. She was last known well on 02/25/16. She was noted to have nonsensical speech output by her daughter this afternoon, and was thought to probably be experiencing a recurrent stroke. CT scan of the head showed no acute intracranial abnormality. Old left parietal area of encephalomalacia was noted. MRI, in addition to old stroke, showed new areas of patchy acute left temporal parietal ischemic infarction. NIH stroke score was 4.  Assessment and plan acute left posterior MCA territory ischemic infarction 1. HgbA1c, fasting lipid panel -LDL 56 2. PT consult, OT consult, Speech consult 3. Echocardiogram pending 4. Carotid dopplers pending 5. Prophylactic therapy-Antiplatelet med: Aspirin , switch to Plavix? 6. Risk factor modification 7. Telemetry monitoring   COPD -resume home medications, including Singulair, currently stable  Depression-holding lorazepam, resume Remeron, Seroquel,  Gastroesophageal reflux disease-continue  Zantac  Hypertension -holding Cozaar and Accupril    DVT prophylaxsis  Lovenox  Code Status:  Full code      Family Communication: Discussed in detail with the patient, all imaging results, lab results explained to the patient   Disposition Plan: 1-2 days     Consultants:  Neurology  Procedures:  None  Antibiotics: Anti-infectives    None         HPI/Subjective: Noted to have expressive aphasia  Objective: Vitals:   02/27/16 0200 02/27/16 0400 02/27/16 0600 02/27/16 0800  BP: (!) 163/76 (!) 156/84 (!) 170/79 (!) 174/75  Pulse: 65 70 73 81  Resp: 16 16  18    Temp: 97.5 F (36.4 C)  98.5 F (36.9 C) 98.1 F (36.7 C)  TempSrc: Oral  Oral Oral  SpO2: 100% 98% 95% 96%  Weight:       No intake or output data in the 24 hours ending 02/27/16 0858  Exam:  Examination:  General exam: Appears calm and comfortable  Respiratory system: Clear to auscultation. Respiratory effort normal. Cardiovascular system: S1 & S2 heard, RRR. No JVD, murmurs, rubs, gallops or clicks. No pedal edema. Gastrointestinal system: Abdomen is nondistended, soft and nontender. No organomegaly or masses felt. Normal bowel sounds heard. Central nervous systeexpressive aphasiam:   Extremities: Symmetric 5 x 5 power. Skin: No rashes, lesions or ulcers Psychiatry: Judgement and insight appear normal. Mood & affect appropriate.     Data Reviewed: I have personally reviewed following labs and imaging studies  Micro Results No results found for this or any previous visit (from the past 240 hour(s)).  Radiology Reports Ct Head Wo Contrast  Result Date: 02/26/2016 CLINICAL DATA:  Aphasia. EXAM: CT HEAD WITHOUT CONTRAST TECHNIQUE: Contiguous axial images were obtained from the base of the skull through the vertex without intravenous contrast. COMPARISON:  MR brain 07/03/2013 and CT head 07/01/2013. FINDINGS: Brain: Difficult to exclude an age-indeterminate lacunar infarct in the left basal ganglia (series 2, image 14). No additional evidence of an acute infarct, acute hemorrhage, mass lesion, mass effect or hydrocephalus. Atrophy. Periventricular low attenuation. Encephalomalacia in the left parietal lobe. Vascular: No hyperdense vessel or unexpected calcification. Skull: Normal. Negative for fracture or focal lesion. Sinuses/Orbits: No acute finding. Other: None. IMPRESSION: 1. Difficult to exclude an age-indeterminate lacunar  infarct in the left basal ganglia. Otherwise, no acute intracranial abnormality. 2. Atrophy and chronic microvascular white matter ischemic changes. 3. Left  parietal lobe encephalomalacia. Electronically Signed   By: Lorin Picket M.D.   On: 02/26/2016 19:11   Mr Brain Wo Contrast  Addendum Date: 02/26/2016   ADDENDUM REPORT: 02/26/2016 23:44 ADDENDUM: Acute findings discussed with and reconfirmed by Dr.JARED GARDNER on 02/26/2016 at 11:40pm. Electronically Signed   By: Elon Alas M.D.   On: 02/26/2016 23:44   Result Date: 02/26/2016 CLINICAL DATA:  Altered speech, aphasic. History of stroke, hypertension, carotid artery aneurysm. EXAM: MRI HEAD WITHOUT CONTRAST MRA HEAD WITHOUT CONTRAST TECHNIQUE: Multiplanar, multiecho pulse sequences of the brain and surrounding structures were obtained without intravenous contrast. Angiographic images of the head were obtained using MRA technique without contrast. COMPARISON:  CT HEAD February 26, 2016 at 1858 hours and, MRI of the head July 03, 2013 and MRA head March 10, 2010 moderate stenosis in luminal irregularity RIGHT M1 segment. FINDINGS: MRI HEAD FINDINGS BRAIN: Patchy reduced diffusion LEFT temporoparietal lobe extending to the periventricular white matter. 11 mm ovoid focus of reduced diffusion LEFT posterior corona radiata. Areas of diffusion abnormality demonstrate low ADC values. Faint susceptibility artifact LEFT parietal lobe at site of old LEFT parietal lobe infarct with encephalomalacia. Old RIGHT thalamus lacunar infarct. Ex vacuo dilatation subjacent LEFT lateral ventricle, no hydrocephalus. No suspicious parenchymal signal, masses or mass effect. Similar patchy to confluent supratentorial pontine white matter FLAIR T2 hyperintensities. No abnormal extra-axial fluid collections. No extra-axial masses though, contrast enhanced sequences would be more sensitive. VASCULAR: Normal major intracranial vascular flow voids present at skull base. SKULL AND UPPER CERVICAL SPINE: No abnormal sellar expansion. No suspicious calvarial bone marrow signal. Craniocervical junction maintained. SINUSES/ORBITS: The  mastoid air-cells and included paranasal sinuses are well-aerated. Status post bilateral ocular lens implants. The included ocular globes and orbital contents are non-suspicious. OTHER: Patient is edentulous. MRA HEAD FINDINGS- mild motion degraded examination. ANTERIOR CIRCULATION: Flow related enhancement of the included cervical, petrous, cavernous and supraclinoid internal carotid arteries. Moderate stenosis LEFT cavernous internal carotid artery and, moderate stenosis LEFT greater than RIGHT supraclinoid internal carotid arteries. 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 Moderate luminal irregularity of the anterior cerebral arteries. Patent anterior communicating artery. Moderate stenosis proximal LEFT A2 segment. Moderate stenosis distal LEFT M1 segment. LEFT M2 segment occlusion. Moderate tandem stenosis RIGHT M1 segment. POSTERIOR CIRCULATION: RIGHT vertebral artery is dominant. Basilar artery is patent, with normal flow related enhancement of the main branch vessels. Normal flow related enhancement of the posterior cerebral arteries. Moderate luminal irregularity of the posterior cerebral arteries. No aneurysm. IMPRESSION: MRI HEAD: Acute patchy small LEFT temporal parietal/MCA territory nonhemorrhagic infarct. 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 Mild to moderate chronic small vessel ischemic disease. MRA HEAD: LEFT M2 occlusion may be acute. Moderate intracranial atherosclerosis. 2 mm LEFT cavernous/meningohypophyseal aneurysm better characterized on prior less motion degraded examination. Electronically Signed: By: Elon Alas M.D. On: 02/26/2016 23:32   Dg Chest Port 1 View  Result Date: 02/26/2016 CLINICAL DATA:  Chest pain today. EXAM: PORTABLE CHEST 1 VIEW COMPARISON:  Or FINDINGS: The heart size and mediastinal contours are within normal limits. Aortic  atherosclerosis. Both lungs are clear. No evidence of pneumothorax or pleural effusion. IMPRESSION: No active disease. Electronically Signed   By: Earle Gell M.D.   On: 02/26/2016 21:19   Mr  Mra Head/brain Wo Cm  Addendum Date: 02/26/2016   ADDENDUM REPORT: 02/26/2016 23:44 ADDENDUM: Acute findings discussed with and reconfirmed by Dr.JARED GARDNER on 02/26/2016 at 11:40pm. Electronically Signed   By: Elon Alas M.D.   On: 02/26/2016 23:44   Result Date: 02/26/2016 CLINICAL DATA:  Altered speech, aphasic. History of stroke, hypertension, carotid artery aneurysm. EXAM: MRI HEAD WITHOUT CONTRAST MRA HEAD WITHOUT CONTRAST TECHNIQUE: Multiplanar, multiecho pulse sequences of the brain and surrounding structures were obtained without intravenous contrast. Angiographic images of the head were obtained using MRA technique without contrast. COMPARISON:  CT HEAD February 26, 2016 at 1858 hours and, MRI of the head July 03, 2013 and MRA head March 10, 2010 moderate stenosis in luminal irregularity RIGHT M1 segment. FINDINGS: MRI HEAD FINDINGS BRAIN: Patchy reduced diffusion LEFT temporoparietal lobe extending to the periventricular white matter. 11 mm ovoid focus of reduced diffusion LEFT posterior corona radiata. Areas of diffusion abnormality demonstrate low ADC values. Faint susceptibility artifact LEFT parietal lobe at site of old LEFT parietal lobe infarct with encephalomalacia. Old RIGHT thalamus lacunar infarct. Ex vacuo dilatation subjacent LEFT lateral ventricle, no hydrocephalus. No suspicious parenchymal signal, masses or mass effect. Similar patchy to confluent supratentorial pontine white matter FLAIR T2 hyperintensities. No abnormal extra-axial fluid collections. No extra-axial masses though, contrast enhanced sequences would be more sensitive. VASCULAR: Normal major intracranial vascular flow voids present at skull base. SKULL AND UPPER CERVICAL SPINE: No abnormal sellar expansion. No suspicious  calvarial bone marrow signal. Craniocervical junction maintained. SINUSES/ORBITS: The mastoid air-cells and included paranasal sinuses are well-aerated. Status post bilateral ocular lens implants. The included ocular globes and orbital contents are non-suspicious. OTHER: Patient is edentulous. MRA HEAD FINDINGS- mild motion degraded examination. ANTERIOR CIRCULATION: Flow related enhancement of the included cervical, petrous, cavernous and supraclinoid internal carotid arteries. Moderate stenosis LEFT cavernous internal carotid artery and, moderate stenosis LEFT greater than RIGHT supraclinoid internal carotid arteries. 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 Moderate luminal irregularity of the anterior cerebral arteries. Patent anterior communicating artery. Moderate stenosis proximal LEFT A2 segment. Moderate stenosis distal LEFT M1 segment. LEFT M2 segment occlusion. Moderate tandem stenosis RIGHT M1 segment. POSTERIOR CIRCULATION: RIGHT vertebral artery is dominant. Basilar artery is patent, with normal flow related enhancement of the main branch vessels. Normal flow related enhancement of the posterior cerebral arteries. Moderate luminal irregularity of the posterior cerebral arteries. No aneurysm. IMPRESSION: MRI HEAD: Acute patchy small LEFT temporal parietal/MCA territory nonhemorrhagic infarct. 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 Mild to moderate chronic small vessel ischemic disease. MRA HEAD: LEFT M2 occlusion may be acute. Moderate intracranial atherosclerosis. 2 mm LEFT cavernous/meningohypophyseal aneurysm better characterized on prior less motion degraded examination. Electronically Signed: By: Elon Alas M.D. On: 02/26/2016 23:32     CBC  Recent Labs Lab 02/26/16 1910  WBC 8.6  HGB 12.3  HCT 36.7  PLT 281  MCV 95.1  MCH 31.9  MCHC 33.5  RDW 13.4   LYMPHSABS 2.9  MONOABS 1.0  EOSABS 0.3  BASOSABS 0.1    Chemistries   Recent Labs Lab 02/26/16 1910  NA 133*  K 4.8  CL 102  CO2 22  GLUCOSE 75  BUN 9  CREATININE 1.13*  CALCIUM 9.3  AST 22  ALT 10*  ALKPHOS 60  BILITOT 0.3   ------------------------------------------------------------------------------------------------------------------ estimated creatinine clearance is 32.3 mL/min (by C-G formula based on SCr of 1.13 mg/dL (  H)). ------------------------------------------------------------------------------------------------------------------ No results for input(s): HGBA1C in the last 72 hours. ------------------------------------------------------------------------------------------------------------------  Recent Labs  02/27/16 0628  CHOL 126  HDL 54  LDLCALC 56  TRIG 80  CHOLHDL 2.3   ------------------------------------------------------------------------------------------------------------------ No results for input(s): TSH, T4TOTAL, T3FREE, THYROIDAB in the last 72 hours.  Invalid input(s): FREET3 ------------------------------------------------------------------------------------------------------------------ No results for input(s): VITAMINB12, FOLATE, FERRITIN, TIBC, IRON, RETICCTPCT in the last 72 hours.  Coagulation profile  Recent Labs Lab 02/26/16 1910  INR 0.98    No results for input(s): DDIMER in the last 72 hours.  Cardiac Enzymes No results for input(s): CKMB, TROPONINI, MYOGLOBIN in the last 168 hours.  Invalid input(s): CK ------------------------------------------------------------------------------------------------------------------ Invalid input(s): POCBNP   CBG: No results for input(s): GLUCAP in the last 168 hours.     Studies: Ct Head Wo Contrast  Result Date: 02/26/2016 CLINICAL DATA:  Aphasia. EXAM: CT HEAD WITHOUT CONTRAST TECHNIQUE: Contiguous axial images were obtained from the base of the skull through the  vertex without intravenous contrast. COMPARISON:  MR brain 07/03/2013 and CT head 07/01/2013. FINDINGS: Brain: Difficult to exclude an age-indeterminate lacunar infarct in the left basal ganglia (series 2, image 14). No additional evidence of an acute infarct, acute hemorrhage, mass lesion, mass effect or hydrocephalus. Atrophy. Periventricular low attenuation. Encephalomalacia in the left parietal lobe. Vascular: No hyperdense vessel or unexpected calcification. Skull: Normal. Negative for fracture or focal lesion. Sinuses/Orbits: No acute finding. Other: None. IMPRESSION: 1. Difficult to exclude an age-indeterminate lacunar infarct in the left basal ganglia. Otherwise, no acute intracranial abnormality. 2. Atrophy and chronic microvascular white matter ischemic changes. 3. Left parietal lobe encephalomalacia. Electronically Signed   By: Lorin Picket M.D.   On: 02/26/2016 19:11   Mr Brain Wo Contrast  Addendum Date: 02/26/2016   ADDENDUM REPORT: 02/26/2016 23:44 ADDENDUM: Acute findings discussed with and reconfirmed by Dr.JARED GARDNER on 02/26/2016 at 11:40pm. Electronically Signed   By: Elon Alas M.D.   On: 02/26/2016 23:44   Result Date: 02/26/2016 CLINICAL DATA:  Altered speech, aphasic. History of stroke, hypertension, carotid artery aneurysm. EXAM: MRI HEAD WITHOUT CONTRAST MRA HEAD WITHOUT CONTRAST TECHNIQUE: Multiplanar, multiecho pulse sequences of the brain and surrounding structures were obtained without intravenous contrast. Angiographic images of the head were obtained using MRA technique without contrast. COMPARISON:  CT HEAD February 26, 2016 at 1858 hours and, MRI of the head July 03, 2013 and MRA head March 10, 2010 moderate stenosis in luminal irregularity RIGHT M1 segment. FINDINGS: MRI HEAD FINDINGS BRAIN: Patchy reduced diffusion LEFT temporoparietal lobe extending to the periventricular white matter. 11 mm ovoid focus of reduced diffusion LEFT posterior corona radiata.  Areas of diffusion abnormality demonstrate low ADC values. Faint susceptibility artifact LEFT parietal lobe at site of old LEFT parietal lobe infarct with encephalomalacia. Old RIGHT thalamus lacunar infarct. Ex vacuo dilatation subjacent LEFT lateral ventricle, no hydrocephalus. No suspicious parenchymal signal, masses or mass effect. Similar patchy to confluent supratentorial pontine white matter FLAIR T2 hyperintensities. No abnormal extra-axial fluid collections. No extra-axial masses though, contrast enhanced sequences would be more sensitive. VASCULAR: Normal major intracranial vascular flow voids present at skull base. SKULL AND UPPER CERVICAL SPINE: No abnormal sellar expansion. No suspicious calvarial bone marrow signal. Craniocervical junction maintained. SINUSES/ORBITS: The mastoid air-cells and included paranasal sinuses are well-aerated. Status post bilateral ocular lens implants. The included ocular globes and orbital contents are non-suspicious. OTHER: Patient is edentulous. MRA HEAD FINDINGS- mild motion degraded examination. ANTERIOR CIRCULATION: Flow related enhancement of the included cervical, petrous,  cavernous and supraclinoid internal carotid arteries. Moderate stenosis LEFT cavernous internal carotid artery and, moderate stenosis LEFT greater than RIGHT supraclinoid internal carotid arteries. 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 Moderate luminal irregularity of the anterior cerebral arteries. Patent anterior communicating artery. Moderate stenosis proximal LEFT A2 segment. Moderate stenosis distal LEFT M1 segment. LEFT M2 segment occlusion. Moderate tandem stenosis RIGHT M1 segment. POSTERIOR CIRCULATION: RIGHT vertebral artery is dominant. Basilar artery is patent, with normal flow related enhancement of the main branch vessels. Normal flow related enhancement of the posterior cerebral arteries.  Moderate luminal irregularity of the posterior cerebral arteries. No aneurysm. IMPRESSION: MRI HEAD: Acute patchy small LEFT temporal parietal/MCA territory nonhemorrhagic infarct. 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 Mild to moderate chronic small vessel ischemic disease. MRA HEAD: LEFT M2 occlusion may be acute. Moderate intracranial atherosclerosis. 2 mm LEFT cavernous/meningohypophyseal aneurysm better characterized on prior less motion degraded examination. Electronically Signed: By: Elon Alas M.D. On: 02/26/2016 23:32   Dg Chest Port 1 View  Result Date: 02/26/2016 CLINICAL DATA:  Chest pain today. EXAM: PORTABLE CHEST 1 VIEW COMPARISON:  Or FINDINGS: The heart size and mediastinal contours are within normal limits. Aortic atherosclerosis. Both lungs are clear. No evidence of pneumothorax or pleural effusion. IMPRESSION: No active disease. Electronically Signed   By: Earle Gell M.D.   On: 02/26/2016 21:19   Mr Jodene Nam Head/brain [id number] Cm  Addendum Date: 02/26/2016   ADDENDUM REPORT: 02/26/2016 23:44 ADDENDUM: Acute findings discussed with and reconfirmed by Dr.JARED GARDNER on 02/26/2016 at 11:40pm. Electronically Signed   By: Elon Alas M.D.   On: 02/26/2016 23:44   Result Date: 02/26/2016 CLINICAL DATA:  Altered speech, aphasic. History of stroke, hypertension, carotid artery aneurysm. EXAM: MRI HEAD WITHOUT CONTRAST MRA HEAD WITHOUT CONTRAST TECHNIQUE: Multiplanar, multiecho pulse sequences of the brain and surrounding structures were obtained without intravenous contrast. Angiographic images of the head were obtained using MRA technique without contrast. COMPARISON:  CT HEAD February 26, 2016 at 1858 hours and, MRI of the head July 03, 2013 and MRA head March 10, 2010 moderate stenosis in luminal irregularity RIGHT M1 segment. FINDINGS: MRI HEAD FINDINGS BRAIN: Patchy reduced diffusion LEFT temporoparietal lobe extending to the periventricular white  matter. 11 mm ovoid focus of reduced diffusion LEFT posterior corona radiata. Areas of diffusion abnormality demonstrate low ADC values. Faint susceptibility artifact LEFT parietal lobe at site of old LEFT parietal lobe infarct with encephalomalacia. Old RIGHT thalamus lacunar infarct. Ex vacuo dilatation subjacent LEFT lateral ventricle, no hydrocephalus. No suspicious parenchymal signal, masses or mass effect. Similar patchy to confluent supratentorial pontine white matter FLAIR T2 hyperintensities. No abnormal extra-axial fluid collections. No extra-axial masses though, contrast enhanced sequences would be more sensitive. VASCULAR: Normal major intracranial vascular flow voids present at skull base. SKULL AND UPPER CERVICAL SPINE: No abnormal sellar expansion. No suspicious calvarial bone marrow signal. Craniocervical junction maintained. SINUSES/ORBITS: The mastoid air-cells and included paranasal sinuses are well-aerated. Status post bilateral ocular lens implants. The included ocular globes and orbital contents are non-suspicious. OTHER: Patient is edentulous. MRA HEAD FINDINGS- mild motion degraded examination. ANTERIOR CIRCULATION: Flow related enhancement of the included cervical, petrous, cavernous and supraclinoid internal carotid arteries. Moderate stenosis LEFT cavernous internal carotid artery and, moderate stenosis LEFT greater than RIGHT supraclinoid internal carotid arteries. 2 mm LEFT cavernous internal carotid artery aneurysm was present previously and better seen on prior less motion  degraded examination. Moderate luminal irregularity of the anterior cerebral arteries. Patent anterior communicating artery. Moderate stenosis proximal LEFT A2 segment. Moderate stenosis distal LEFT M1 segment. LEFT M2 segment occlusion. Moderate tandem stenosis RIGHT M1 segment. POSTERIOR CIRCULATION: RIGHT vertebral artery is dominant. Basilar artery is patent, with normal flow related enhancement of the main  branch vessels. Normal flow related enhancement of the posterior cerebral arteries. Moderate luminal irregularity of the posterior cerebral arteries. No aneurysm. IMPRESSION: MRI HEAD: Acute patchy small LEFT temporal parietal/MCA territory nonhemorrhagic infarct. 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 Mild to moderate chronic small vessel ischemic disease. MRA HEAD: LEFT M2 occlusion may be acute. Moderate intracranial atherosclerosis. 2 mm LEFT cavernous/meningohypophyseal aneurysm better characterized on prior less motion degraded examination. Electronically Signed: By: Elon Alas M.D. On: 02/26/2016 23:32      Lab Results  Component Value Date   HGBA1C 5.0 01/25/2016   HGBA1C 5.6 10/25/2015   HGBA1C 5.3 07/07/2015   Lab Results  Component Value Date   MICROALBUR 0.2 07/07/2015   LDLCALC 56 02/27/2016   CREATININE 1.13 (H) 02/26/2016       Scheduled Meds: . aspirin  300 mg Rectal Daily   Or  . aspirin  325 mg Oral Daily  . enoxaparin (LOVENOX) injection  40 mg Subcutaneous Q24H   Continuous Infusions: . sodium chloride 100 mL/hr at 02/26/16 2357     LOS: 1 day    Time spent: >30 MINS    [facility]  [facility] Pager [phone]. If 7PM-7AM, please contact night-coverage at [email or web address], password [facility] 02/27/2016, 8:58 AM  LOS: 1 day

## 2016-02-27 NOTE — Progress Notes (Addendum)
STROKE TEAM PROGRESS NOTE   HISTORY OF PRESENT ILLNESS (per record) PENELOPEROSE SODERMAN is an 70 y.o. female with a history of previous stroke,, degenerative joint disease, COPD and carotid aneurysm, presenting with new onset speech abnormality. She was last known well on 02/25/16, time not documented. She was noted to have nonsensical speech output by her daughter this afternoon, and was thought to probably be experiencing a recurrent stroke. CT scan of the head showed no acute intracranial abnormality. Old left parietal area of encephalomalacia was noted. MRI, in addition to old stroke, showed new areas of patchy acute left temporal parietal ischemic infarction. NIH stroke score was 4. Patient was not administered IV t-PA secondary to beyond time window for treatment consideration. She was admitted for further evaluation and treatment.   SUBJECTIVE (INTERVAL HISTORY) No family is at bedside. Pt still has global aphasia, receptive > expressive, with dysarthria. Not cooperative on exam due to aphasia.    OBJECTIVE Temp:  [97.5 F (36.4 C)-98.9 F (37.2 C)] 98.7 F (37.1 C) (12/04 1513) Pulse Rate:  [59-86] 85 (12/04 1513) Cardiac Rhythm: Normal sinus rhythm (12/04 0800) Resp:  [10-19] 16 (12/04 1513) BP: (143-179)/(72-90) 165/86 (12/04 1513) SpO2:  [93 %-100 %] 93 % (12/04 1513) FiO2 (%):  [21 %] 21 % (12/03 2045) Weight:  [49.1 kg (108 lb 3.2 oz)] 49.1 kg (108 lb 3.2 oz) (12/03 2330)  CBC:   Recent Labs Lab 02/26/16 1910  WBC 8.6  NEUTROABS 4.3  HGB 12.3  HCT 36.7  MCV 95.1  PLT [id number]    Basic Metabolic Panel:   Recent Labs Lab 02/26/16 1910  NA 133*  K 4.8  CL 102  CO2 22  GLUCOSE 75  BUN 9  CREATININE 1.13*  CALCIUM 9.3    Lipid Panel:     Component Value Date/Time   CHOL 126 02/27/2016 0628   TRIG 80 02/27/2016 0628   HDL 54 02/27/2016 0628   CHOLHDL 2.3 02/27/2016 0628   VLDL 16 02/27/2016 0628   LDLCALC 56 02/27/2016 0628   HgbA1c:  Lab Results  Component  Value Date   HGBA1C 5.0 01/25/2016   Urine Drug Screen: No results found for: LABOPIA, COCAINSCRNUR, LABBENZ, AMPHETMU, THCU, LABBARB    IMAGING I have personally reviewed the radiological images below and agree with the radiology interpretations.  Ct Head Wo Contrast 02/26/2016 1. Difficult to exclude an age-indeterminate lacunar infarct in the left basal ganglia. Otherwise, no acute intracranial abnormality. 2. Atrophy and chronic microvascular white matter ischemic changes. 3. Left parietal lobe encephalomalacia.   Ct Angio Neck W Or Wo Contrast 02/27/2016 1. 35% proximal left ICA stenosis. 2. Mild-to-moderate right and severe left proximal vertebral artery stenoses.   Dg Chest Port 1 View 02/26/2016 No active disease.   MRI HEAD 02/26/2016  Acute patchy small LEFT temporal parietal/MCA territory nonhemorrhagic infarct. 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 Mild to moderate chronic small vessel ischemic disease.   MRA HEAD 02/26/2016 LEFT M2 occlusion may be acute. Moderate intracranial atherosclerosis. 2 mm LEFT cavernous/meningohypophyseal aneurysm better characterized on prior less motion degraded examination.   2D Echocardiogram  - Left ventricle: The cavity size was normal. Wall thickness was normal. Systolic function was normal. The estimated ejection fraction was in the range of 55% to 60%. Wall motion was normal; there were no regional wall motion abnormalities. Doppler parameters are consistent with abnormal left ventricular relaxation (grade 1 diastolic dysfunction). - Aortic valve: There was trivial regurgitation. - Pericardium,  extracardiac: A small pericardial effusion was identified. There was no evidence of hemodynamic compromise.   PHYSICAL EXAM  Temp:  [97.5 F (36.4 C)-98.9 F (37.2 C)] 98.5 F (36.9 C) (12/05 0139) Pulse Rate:  [65-88] 81 (12/05 0139) Resp:  [16-20] 20 (12/05 0139) BP: (144-179)/(73-86) 144/73 (12/05  0139) SpO2:  [93 %-100 %] 96 % (12/05 0139)  General - Well nourished, well developed, in no apparent distress.  Ophthalmologic - Fundi not visualized due to noncooperation.  Cardiovascular - Regular rate and rhythm.  Neuro - awake alert, not following commands. Global aphasia, receptive > expressive. Able to have words and short sentences, but slow and with hesitancy, and dysarthria. Not able to repeat or name. PERRL, blinking to visual threat bilaterally. No significant facial asymmetry, tongue in the middle. Moving all extremities equally and bilaterally. DTR 1+ and no babinski. Sensation, coordination and gait not tested.    ASSESSMENT/PLAN Ms. BRITTNEY CASTILLEJO is a 70 y.o. female with history of previous stroke, degenerative joint disease, COPD and carotid aneurysm presenting with nonsensical speech. She did not receive IV t-PA due to delay in arrival.   Stroke:  Patchy left temporal parietal infarct near previous encephalomalacia, more likely due to large vessel disease from left ICA soft plaque. cardioembolic can not be ruled out.   Resultant  Aphasia, receptive > expressive  MRI  Patchy L temporal parietal/MCA infarct near remote L parietal/MCA. R thalamic infarcts.  MRA  L M2 occlusion. Moderate atherosclerosis. 9mm L cavernous aneurysm  CTA neck 35% proximal L ICA, however, significant soft plaque at origin.   2D Echo  EF 55-60%. No source of embolus   Recommend 30 day cardiac event monitoring as outpt to rule out afib  LDL 56  HgbA1c pending  Lovenox 40 mg sq daily for VTE prophylaxis DIET DYS 2 Room service appropriate? Yes; Fluid consistency: Thin  aspirin 81 mg daily prior to admission, now on aspirin 325 mg daily. Recommend DAPT for 3 months and then plavix alone.   Patient counseled to be compliant with her antithrombotic medications  Ongoing aggressive stroke risk factor management  Therapy recommendations:  SNF  Disposition:  pending   Hx of  stroke  Remote left parietal infarcts prior to 2004  2004 MRI punctate infarcts at the margin of old left parietal infarcts - MRA left M2 occlusion  2011 MRI negative and MRA normal with left M2 recannulized.  06/2013 MRI negative   02/2016 (this time) - left MCA infarct near the old infarcts, MRA showed left M2 occlusion  Hypertension  cozaar at home  Stable  Permissive hypertension (OK if < 220/120) but gradually normalize in 5-7 days  Long-term BP goal normotensive  Tobacco abuse  Current smoker  Smoking cessation counseling provided  Other Stroke Risk Factors  Advanced age  Other Active Problems  COPD  Depression  GERD  Hospital day # 1  Rosalin Hawking, MD PhD Stroke Neurology 02/28/2016 2:06 AM   To contact Stroke Continuity provider, please refer to [email or web address] After hours, contact General Neurology

## 2016-02-27 NOTE — Progress Notes (Signed)
  Echocardiogram 2D Echocardiogram has been performed.  Jennette Dubin 02/27/2016, 1:26 PM

## 2016-02-27 NOTE — Evaluation (Signed)
Speech Language Pathology Evaluation Patient Details Name: [name] MRN: [id number] DOB: 10-27-1945 Today's Date: 02/27/2016 Time: [id number] SLP Time Calculation (min) (ACUTE ONLY): 15 min  Problem List:  Patient Active Problem List   Diagnosis Date Noted  . Acute encephalopathy 02/26/2016  . Osteoporosis 07/04/2015  . Impaired cognition 03/30/2014  . Vitamin D deficiency 08/18/2013  . Hyperlipidemia 08/18/2013  . Prediabetes 08/18/2013  . Medication management 08/18/2013  . CKD (chronic kidney disease) stage 3, GFR 30-59 ml/min 07/01/2013  . Anemia of chronic disease 07/01/2013  . Depression, major, in remission ([place]) 06/30/2013  . HTN 02/09/2013  . COPD GOLD III 03/28/2012  . Vocal cord dysfunction 03/28/2012  . Carotid artery aneurysm ([place]) 03/04/2012  . Smoker 03/04/2012   Past Medical History:  Past Medical History:  Diagnosis Date  . Carotid artery aneurysm ([place]) 03/04/2012  . COPD (chronic obstructive pulmonary disease) ([place])   . Depression   . DJD (degenerative joint disease)   . GERD (gastroesophageal reflux disease)   . Hypertension   . Stroke [facility])    was on Plavix - not now   Past Surgical History:  Past Surgical History:  Procedure Laterality Date  . CATARACT EXTRACTION Bilateral   . COLONOSCOPY  2004  . HERNIA REPAIR Right 2014   inguinal  . KYPHOPLASTY N/A 10/28/2014   Procedure: KYPHOPLASTY;  Surgeon: Phylliss Bob, MD;  Location: [place];  Service: Orthopedics;  Laterality: N/A;  Lumbar 1 kyphoplasty  . MICROLARYNGOSCOPY WITH CO2 LASER AND EXCISION OF VOCAL CORD LESION N/A 06/05/2013   Procedure: MICROLARYNGOSCOPY WITH CO2 LASER AND EXCISION OF VOCAL CORD POLYPS;  Surgeon: Melida Quitter, MD;  Location: [place];  Service: ENT;  Laterality: N/A;  . TONSILLECTOMY    . TUBAL LIGATION  30 years  . VIDEO BRONCHOSCOPY  04/24/2012   Procedure: VIDEO BRONCHOSCOPY WITHOUT FLUORO;  Surgeon: Rigoberto Noel, MD;  Location: [place];  Service: Cardiopulmonary;   Laterality: Bilateral;   HPI:  70 y.o.femalewith a history of previous stroke,, degenerative joint disease, COPD and carotid aneurysm, presenting with new onset speech abnormality. She was last known well on 02/25/16. She was noted to have nonsensical speech output by her daughter this afternoon, and was thought to probably be experiencing a recurrent stroke. CT scan of the head showed no acute intracranial abnormality. Old left parietal area of encephalomalacia was noted. MRI, in addition to old stroke, showed new areas of patchy acute left temporal parietal ischemic infarction. NIH stroke score was 4.    Assessment / Plan / Recommendation Clinical Impression  Pt presents with expressive aphasia and intermittent apraxic movements when attempting to communicate verbally. Pt with intermittent spontaneous intelligible utterances that are appropriate and well formed however her communicate ability is often nonsensical with pt aware of errors but not stimuable for correction. Pt requires skilled ST to address expressive language deficits. Cognition difficult to assess d/t communication deficits. Will further assess as appropriate.     SLP Assessment  Patient needs continued Speech Lanaguage Pathology Services    Follow Up Recommendations  Skilled Nursing facility (TBD for language deficits)    Frequency and Duration min 2x/week  2 weeks      SLP Evaluation Cognition  Overall Cognitive Status: Difficult to assess (d/t communication deficits) Arousal/Alertness: Awake/alert Orientation Level: Oriented to person Attention: Selective Selective Attention: Appears intact Comments: Cognition is difficult to asssess d/t communication deficits       Comprehension  Auditory Comprehension Overall Auditory Comprehension: Appears within functional limits  for tasks assessed Yes/No Questions: Within Functional Limits Commands: Within Functional Limits Conversation:  (unable to assess) Visual  Recognition/Discrimination Discrimination: Not tested Reading Comprehension Reading Status: Not tested    Expression Expression Primary Mode of Expression: Verbal Verbal Expression Overall Verbal Expression: Impaired Initiation: No impairment Level of Generative/Spontaneous Verbalization: Phrase Repetition: Impaired Level of Impairment: Word level Naming: Impairment Responsive: 0-25% accurate Confrontation: Not tested Convergent: Not tested Divergent: Not tested Verbal Errors: Semantic paraphasias;Aware of errors Pragmatics: Unable to assess Non-Verbal Means of Communication: Not applicable Written Expression Dominant Hand: Right Written Expression: Not tested   Oral / Motor  Oral Motor/Sensory Function Overall Oral Motor/Sensory Function: Within functional limits Motor Speech Overall Motor Speech: Impaired (At times, pt with some apraxic-like oral movements) Respiration: Within functional limits Phonation: Normal Resonance: Within functional limits Articulation: Impaired Level of Impairment: Word Intelligibility: Intelligibility reduced Word: 25-49% accurate Phrase: 50-74% accurate Sentence: 25-49% accurate Conversation: 0-24% accurate Motor Planning: Impaired Level of Impairment: Word Motor Speech Errors: Aware;Inconsistent   GO            [name], M.S., CCC-SLP Speech-Language Pathologist [phone]         [name] 02/27/2016, 4:22 PM

## 2016-02-27 NOTE — Evaluation (Signed)
Physical Therapy Evaluation Patient Details Name: [name] MRN: [id number] DOB: 1945/10/09 Today's Date: 02/27/2016   History of Present Illness  Pt with acute encephalopathy and impaired speech. MRI + L posterior MCA territory infarct. PMH: CVA, DJD, COPD, carotid aneurysm, depression.    Clinical Impression  Pt admitted with above diagnosis. Evaluation complicated by patient's communication deficits. Unable to reliably answer yes/no questions. At times partial sentence would be clearly stated, however mostly nonsensical language. Pt currently with functional limitations due to the deficits listed below (see PT Problem List).  Pt will benefit from skilled PT to increase their independence and safety with mobility to allow discharge to the venue listed below.       Follow Up Recommendations SNF;Supervision/Assistance - 24 hour (due to communication deficits)    Equipment Recommendations  Other (comment) (tBA)    Recommendations for Other Services Speech consult     Precautions / Restrictions Precautions Precautions: Fall      Mobility  Bed Mobility Overal bed mobility: Needs Assistance Bed Mobility: Supine to Sit     Supine to sit: Min guard Sit to supine: Supervision;HOB elevated   General bed mobility comments: tactile cues to initiate (did not follow gestures)  Transfers Overall transfer level: Needs assistance Equipment used: None Transfers: Sit to/from Stand Sit to Stand: Min assist         General transfer comment: slightly unsteady with small step upon standing  Ambulation/Gait Ambulation/Gait assistance: Min assist Ambulation Distance (Feet): 80 Feet Assistive device: None Gait Pattern/deviations: Step-through pattern;Drifts right/left   Gait velocity interpretation: Below normal speed for age/gender General Gait Details: Patient with drift to lt/rt with head turns (pt voluntarily turning her head to look into other rooms)  Stairs             Wheelchair Mobility    Modified Rankin (Stroke Patients Only) Modified Rankin (Stroke Patients Only) Pre-Morbid Rankin Score: No significant disability Modified Rankin: Moderate disability     Balance Overall balance assessment: Needs assistance Sitting-balance support: Feet supported Sitting balance-Leahy Scale: Fair       Standing balance-Leahy Scale: Poor                   Standardized Balance Assessment Standardized Balance Assessment :  (unable to get pt to follow instructions)           Pertinent Vitals/Pain BP after ambulation 145/86  Pain Assessment: Faces Faces Pain Scale: No hurt    Home Living Family/patient expects to be discharged to:: Skilled nursing facility                      Prior Function Level of Independence: Independent with assistive device(s)   Gait / Transfers Assistance Needed: walked without a device  ADL's / Homemaking Assistance Needed: used a riser over her toilet, otherwise indendent  Comments: Pt lives alone and drives per daughter.     Hand Dominance   Dominant Hand: Right    Extremity/Trunk Assessment   Upper Extremity Assessment: Defer to OT evaluation           Lower Extremity Assessment: Overall WFL for tasks assessed      Cervical / Trunk Assessment: Kyphotic  Communication   Communication: Receptive difficulties;Expressive difficulties  Cognition Arousal/Alertness: Awake/alert Behavior During Therapy: Flat affect Overall Cognitive Status: Difficult to assess                      General Comments General comments (  skin integrity, edema, etc.): Per OT, she spoke to pt's daughter and pt lives alone wiht no one to provide assist on discharge    Exercises     Assessment/Plan    PT Assessment Patient needs continued PT services  PT Problem List Decreased balance;Decreased mobility;Decreased knowledge of use of DME          PT Treatment Interventions DME instruction;Gait  training;Stair training;Functional mobility training;Therapeutic activities;Balance training;Neuromuscular re-education;Patient/family education    PT Goals (Current goals can be found in the Care Plan section)  Acute Rehab PT Goals Patient Stated Goal: unable to state, daughter wants rehab PT Goal Formulation: Patient unable to participate in goal setting Time For Goal Achievement: 03/12/16 Potential to Achieve Goals: Good    Frequency Min 3X/week   Barriers to discharge Decreased caregiver support      Co-evaluation               End of Session Equipment Utilized During Treatment: Gait belt Activity Tolerance: Patient tolerated treatment well Patient left: in chair;with call bell/phone within reach;with chair alarm set Nurse Communication: Mobility status;Other (comment) (up with chair alarm)         Time: [id number] PT Time Calculation (min) (ACUTE ONLY): 24 min   Charges:   PT Evaluation $PT Eval Moderate Complexity: 1 Procedure PT Treatments $Gait Training: 8-22 mins   PT G CodesJeanie Cooks Tanner Yeley 03-23-2016, 3:50 PM Pager [phone]

## 2016-02-27 NOTE — Evaluation (Signed)
Occupational Therapy Evaluation Patient Details Name: [name] MRN: [id number] DOB: 02-Apr-1945 Today's Date: 02/27/2016    History of Present Illness Pt with acute encephalopathy and impaired speech. MRI + L posterior MCA territory infarct. PMH: CVA, DJD, COPD, carotid aneurysm, depression.   Clinical Impression   Per daughter, pt was living independently prior to admission and driving. Pt presents with generalized weakness and  impaired communication. Pt required hand held assist this visit for ambulation and supervision for standing activities. Will require post acute rehab in SNF    Follow Up Recommendations  SNF;Supervision/Assistance - 24 hour    Equipment Recommendations   (defer to next venue)    Recommendations for Other Services       Precautions / Restrictions Precautions Precautions: Fall      Mobility Bed Mobility Overal bed mobility: Needs Assistance Bed Mobility: Supine to Sit;Sit to Supine     Supine to sit: Supervision;HOB elevated Sit to supine: Supervision;HOB elevated   General bed mobility comments: max assist to pull up in bed, pt unable to figure out   Transfers Overall transfer level: Needs assistance Equipment used: 1 person hand held assist Transfers: Sit to/from Stand Sit to Stand: Min assist         General transfer comment: pt reaching for therapist's hand upon rising    Balance Overall balance assessment: Needs assistance Sitting-balance support: Feet supported Sitting balance-Leahy Scale: Fair       Standing balance-Leahy Scale: Poor                              ADL Overall ADL's : Needs assistance/impaired Eating/Feeding: Set up;Bed level Eating/Feeding Details (indicate cue type and reason): ate applesauce Grooming: Wash/dry hands;Standing;Minimal assistance Grooming Details (indicate cue type and reason): cues to use soap Upper Body Bathing: Minimal assitance;Sitting   Lower Body Bathing: Minimal  assistance;Sit to/from stand   Upper Body Dressing : Minimal assistance;Sitting Upper Body Dressing Details (indicate cue type and reason): front opening gown Lower Body Dressing: Minimal assistance;Sit to/from stand Lower Body Dressing Details (indicate cue type and reason): assist for standing balance Toilet Transfer: Minimal assistance;Ambulation;BSC (over toilet)   Toileting- Clothing Manipulation and Hygiene: Minimal assistance;Sit to/from stand Toileting - Clothing Manipulation Details (indicate cue type and reason): assist to keep clothing out of toilet     Functional mobility during ADLs: Minimal assistance (hand held)       Vision Additional Comments: vision appears intact   Perception     Praxis      Pertinent Vitals/Pain Pain Assessment: Faces Faces Pain Scale: No hurt     Hand Dominance Right   Extremity/Trunk Assessment Upper Extremity Assessment Upper Extremity Assessment: Overall WFL for tasks assessed (not formally assessed)   Lower Extremity Assessment Lower Extremity Assessment: Defer to PT evaluation       Communication Communication Communication: Receptive difficulties;Expressive difficulties   Cognition Arousal/Alertness: Awake/alert Behavior During Therapy: Flat affect Overall Cognitive Status: Difficult to assess                     General Comments       Exercises       Shoulder Instructions      Home Living Family/patient expects to be discharged to:: Skilled nursing facility  Prior Functioning/Environment Level of Independence: Independent with assistive device(s)  Gait / Transfers Assistance Needed: walked without a device ADL's / Homemaking Assistance Needed: used a riser over her toilet, otherwise indendent   Comments: Pt lives alone and drives per daughter.        OT Problem List: Decreased activity tolerance;Impaired balance (sitting and/or  standing);Decreased knowledge of use of DME or AE;Decreased cognition;Decreased strength   OT Treatment/Interventions: Self-care/ADL training;DME and/or AE instruction;Cognitive remediation/compensation;Balance training;Patient/family education    OT Goals(Current goals can be found in the care plan section) Acute Rehab OT Goals Patient Stated Goal: unable to state, daughter wants rehab OT Goal Formulation: With patient Time For Goal Achievement: 03/12/16 Potential to Achieve Goals: Good ADL Goals Pt Will Perform Grooming: with supervision;standing Pt Will Perform Upper Body Bathing: with supervision;sitting Pt Will Perform Lower Body Bathing: with supervision;sit to/from stand Pt Will Perform Upper Body Dressing: with supervision;sitting Pt Will Perform Lower Body Dressing: with supervision;sit to/from stand Pt Will Transfer to Toilet: with supervision;ambulating;bedside commode (over toilet) Pt Will Perform Toileting - Clothing Manipulation and hygiene: with supervision;sit to/from stand  OT Frequency: Min 2X/week   Barriers to D/C: Decreased caregiver support          Co-evaluation              End of Session Equipment Utilized During Treatment: Gait belt  Activity Tolerance: Patient tolerated treatment well Patient left: in bed;with call bell/phone within reach;with bed alarm set   Time: [id number] OT Time Calculation (min): 14 min Charges:  OT General Charges $OT Visit: 1 Procedure OT Evaluation $OT Eval Moderate Complexity: 1 Procedure G-Codes:    Malka So 02/27/2016, 2:38 PM [phone]

## 2016-02-27 NOTE — Care Management Note (Signed)
Case Management Note  Patient Details  Name: [name] MRN: [id number] Date of Birth: 1945-05-21  Subjective/Objective:   Pt admitted with CVA. She is from home.                 Action/Plan: Awaiting PT/OT recommendations. CM following for d/c disposition.   Expected Discharge Date:                  Expected Discharge Plan:     In-House Referral:     Discharge planning Services     Post Acute Care Choice:    Choice offered to:     DME Arranged:    DME Agency:     HH Arranged:    HH Agency:     Status of Service:  In process, will continue to follow  If discussed at Long Length of Stay Meetings, dates discussed:    Additional Comments:  Pollie Friar, RN 02/27/2016, 1:39 PM

## 2016-02-27 NOTE — Progress Notes (Signed)
Speech Language Pathology Treatment: Cognitive-Linquistic  Patient Details Name: [name] MRN: [id number] DOB: [birth year] Today's Date: 02/27/2016 Time: [id number] SLP Time Calculation (min) (ACUTE ONLY): 15 min  Assessment / Plan / Recommendation Clinical Impression  Skilled treatment session focused on expressive language goals. SLP facilitated session providing Max A verbal cues for expressive language at the word level. Pt with spontaneous utterances at the sentence level but then was not stimulable for imitation when errors occurred.  When errors occurred, pt's responses were nonsensical with some apraxic-like motor patterns.    HPI HPI: 70 y.o.femalewith a history of previous stroke,, degenerative joint disease, COPD and carotid aneurysm, presenting with new onset speech abnormality. She was last known well on 02/25/16. She was noted to have nonsensical speech output by her daughter this afternoon, and was thought to probably be experiencing a recurrent stroke. CT scan of the head showed no acute intracranial abnormality. Old left parietal area of encephalomalacia was noted. MRI, in addition to old stroke, showed new areas of patchy acute left temporal parietal ischemic infarction. NIH stroke score was 4.       SLP Plan  Continue with current plan of care to target expressive language deficits    Recommendations  Diet recommendations: Dysphagia 2 (fine chop);Thin liquid Liquids provided via: Cup;Straw Medication Administration: Whole meds with puree Supervision: Patient able to self feed                Oral Care Recommendations: Oral care BID Follow up Recommendations: Skilled Nursing facility Plan: Continue with current plan of care       GO             Keona Bilyeu B. Rutherford Nail, M.S., CCC-SLP Speech-Language Pathologist [phone]    Tanishi Nault 02/27/2016, 4:24 PM

## 2016-02-27 NOTE — Evaluation (Signed)
Clinical/Bedside Swallow Evaluation Patient Details  Name: [name] MRN: [id number] Date of Birth: 1945/04/11  Today's Date: 02/27/2016 Time: SLP Start Time (ACUTE ONLY): [id number] SLP Stop Time (ACUTE ONLY): 0932 SLP Time Calculation (min) (ACUTE ONLY): 15 min  Past Medical History:  Past Medical History:  Diagnosis Date  . Carotid artery aneurysm ([place]) 03/04/2012  . COPD (chronic obstructive pulmonary disease) ([place])   . Depression   . DJD (degenerative joint disease)   . GERD (gastroesophageal reflux disease)   . Hypertension   . Stroke [facility])    was on Plavix - not now   Past Surgical History:  Past Surgical History:  Procedure Laterality Date  . CATARACT EXTRACTION Bilateral   . COLONOSCOPY  2004  . HERNIA REPAIR Right 2014   inguinal  . KYPHOPLASTY N/A 10/28/2014   Procedure: KYPHOPLASTY;  Surgeon: Phylliss Bob, MD;  Location: [place];  Service: Orthopedics;  Laterality: N/A;  Lumbar 1 kyphoplasty  . MICROLARYNGOSCOPY WITH CO2 LASER AND EXCISION OF VOCAL CORD LESION N/A 06/05/2013   Procedure: MICROLARYNGOSCOPY WITH CO2 LASER AND EXCISION OF VOCAL CORD POLYPS;  Surgeon: Melida Quitter, MD;  Location: [place];  Service: ENT;  Laterality: N/A;  . TONSILLECTOMY    . TUBAL LIGATION  30 years  . VIDEO BRONCHOSCOPY  04/24/2012   Procedure: VIDEO BRONCHOSCOPY WITHOUT FLUORO;  Surgeon: Rigoberto Noel, MD;  Location: [place];  Service: Cardiopulmonary;  Laterality: Bilateral;   HPI:  70 y.o.femalewith a history of previous stroke,, degenerative joint disease, COPD and carotid aneurysm, presenting with new onset speech abnormality. She was last known well on 02/25/16. She was noted to have nonsensical speech output by her daughter this afternoon, and was thought to probably be experiencing a recurrent stroke. CT scan of the head showed no acute intracranial abnormality. Old left parietal area of encephalomalacia was noted. MRI, in addition to old stroke, showed new areas of patchy acute  left temporal parietal ischemic infarction. NIH stroke score was 4.    Assessment / Plan / Recommendation Clinical Impression  Pt presents at reduced aspiration risk with dysphagia 2 diet and thin liquids. Recommend dysphagia 3 for ease of chewing d/t missing/poorly conditioned teeth. No s/s of aspiration or dysphagia present with trials of soft solids.     Aspiration Risk  Mild aspiration risk    Diet Recommendation Dysphagia 2 (Fine chop);Thin liquid   Liquid Administration via: Cup;Straw Medication Administration: Whole meds with puree Supervision: Patient able to self feed    Other  Recommendations Oral Care Recommendations: Oral care BID   Follow up Recommendations None       Swallow Study   General Date of Onset: 02/26/16 HPI: 70 y.o.femalewith a history of previous stroke,, degenerative joint disease, COPD and carotid aneurysm, presenting with new onset speech abnormality. She was last known well on 02/25/16. She was noted to have nonsensical speech output by her daughter this afternoon, and was thought to probably be experiencing a recurrent stroke. CT scan of the head showed no acute intracranial abnormality. Old left parietal area of encephalomalacia was noted. MRI, in addition to old stroke, showed new areas of patchy acute left temporal parietal ischemic infarction. NIH stroke score was 4.  Type of Study: Bedside Swallow Evaluation Previous Swallow Assessment:  (none in chart) Diet Prior to this Study: NPO Temperature Spikes Noted: No Respiratory Status: Room air History of Recent Intubation: No Behavior/Cognition: Alert;Cooperative;Pleasant mood Oral Cavity Assessment: Within Functional Limits Oral Care Completed by SLP: Recent completion  by staff Oral Cavity - Dentition: Poor condition;Missing dentition Vision: Functional for self-feeding Self-Feeding Abilities: Able to feed self Patient Positioning: Upright in bed Baseline Vocal Quality: Normal Volitional Cough:  Strong Volitional Swallow: Able to elicit    Oral/Motor/Sensory Function Overall Oral Motor/Sensory Function: Within functional limits   Ice Chips Ice chips: Within functional limits   Thin Liquid Thin Liquid: Within functional limits Presentation: Cup;Straw;Self Fed    Nectar Thick Nectar Thick Liquid: Not tested   Honey Thick Honey Thick Liquid: Not tested   Puree Puree: Within functional limits Presentation: Spoon   Solid   GO   Solid: Within functional limits Presentation: Self Fed (Soft solids were attempted d/t poor conditioned teeth)       [name] B. Rutherford Nail, M.S., CCC-SLP Speech-Language Pathologist [phone] [name] 02/27/2016,3:52 PM

## 2016-02-28 DIAGNOSIS — I6522 Occlusion and stenosis of left carotid artery: Secondary | ICD-10-CM

## 2016-02-28 DIAGNOSIS — I63412 Cerebral infarction due to embolism of left middle cerebral artery: Principal | ICD-10-CM

## 2016-02-28 LAB — HEMOGLOBIN A1C
Hgb A1c MFr Bld: 5.2 % (ref 4.8–5.6)
MEAN PLASMA GLUCOSE: 103 mg/dL

## 2016-02-28 MED ORDER — CLOPIDOGREL BISULFATE 75 MG PO TABS
75.0000 mg | ORAL_TABLET | Freq: Every day | ORAL | Status: DC
  Administered 2016-02-28 – 2016-02-29 (×2): 75 mg via ORAL
  Filled 2016-02-28 (×2): qty 1

## 2016-02-28 MED ORDER — ATORVASTATIN CALCIUM 10 MG PO TABS
20.0000 mg | ORAL_TABLET | Freq: Every day | ORAL | Status: DC
  Administered 2016-02-28: 20 mg via ORAL
  Filled 2016-02-28: qty 2

## 2016-02-28 NOTE — Consult Note (Signed)
[facility] CM Primary Care Navigator  02/28/2016  LAKEESHA FONTANILLA May 27, 1945 [id number]  Met with patient and friends at the bedside to identify possible discharge needs. Patient noted with some speech abnormality, aphasia.  Electronic medical record shows that Dr. Unk Pinto with [facility] as the primary care provider. Pharmacy is [facility] in [place].  Her friends report that she is very independent with self care, able to drive prior to this admission and manages her own medications at home. She lives alone, drives to her doctors' appointments and cares for her self in her home as stated.  Patient's daughter [name]) lives in [place]. Per CSW note, discharge plan is skilled nursing facility due to deficits in mobility, speech, and comprehension making it difficult for her to return home safely at this time. Daughter was agreeable to recommendations for SNF (skilled nursing facility) in the local [place] area. Daughter is not be able to return to hospital until next week as her daughter is in the process of having a baby any day soon.  Patient letter provided as a reminder to contact primary care provider's office when she returns home, for a post discharge follow-up appointment within a week or sooner if needs arise.  For additional questions please contact:  Edwena Felty A. Kae Lauman, BSN, RN-BC [facility] PRIMARY CARE Navigator Cell: [phone]

## 2016-02-28 NOTE — Progress Notes (Addendum)
Speech Language Pathology Treatment: Cognitive-Linquistic  Patient Details Name: [name] MRN: [id number] DOB: [birth year] Today's Date: 02/28/2016 Time: [id number] SLP Time Calculation (min) (ACUTE ONLY): 16 min  Assessment / Plan / Recommendation Clinical Impression  Skilled treatment session focused on addressing communication goals, due to patient declining PO trails. SLP facilitated session by providing written and demonstrative cues for completion of automatic sequences of counting and naming the days of the week, which patient did with Max faded to Mod assist multimodal cues.  Impaired sustained attention due to what appeared to be internal distractions was a limiting factor.  Patient verbally perseverated on "my daughter should be here."  Patient required near Total assist to watch clinician for phonemic cues during a naming object task with sentence completion cues.  Patient with attempts in 30% of opportunities with errors being phonemic in nature.  Patient also required Max assist verbal and visual cues for accurate yes/no responses with biographical and environmental questions, which resulted in ~40% accuracy.  Patient appears to have receptive deficits as well.  Goals added this date.  Continue with current plan of care.     HPI HPI: 70 y.o.femalewith a history of previous stroke,, degenerative joint disease, COPD and carotid aneurysm, presenting with new onset speech abnormality. She was last known well on 02/25/16. She was noted to have nonsensical speech output by her daughter this afternoon, and was thought to probably be experiencing a recurrent stroke. CT scan of the head showed no acute intracranial abnormality. Old left parietal area of encephalomalacia was noted. MRI, in addition to old stroke, showed new areas of patchy acute left temporal parietal ischemic infarction. NIH stroke score was 4.       SLP Plan  Continue with current plan of care     Recommendations                    Oral Care Recommendations: Oral care BID Follow up Recommendations: 24 hour supervision/assistance;Inpatient Rehab Plan: Continue with current plan of care       GO               [name]., CCC-SLP [id number]  [place] 02/28/2016, 11:12 AM

## 2016-02-28 NOTE — Progress Notes (Signed)
STROKE TEAM PROGRESS NOTE   SUBJECTIVE (INTERVAL HISTORY) No family is at bedside. Pt still has global aphasia, receptive > expressive, with dysarthria, but seems improved some. Able to follow eye closure and opening, however, perseveration.    OBJECTIVE Temp:  [97.8 F (36.6 C)-98.6 F (37 C)] 98.3 F (36.8 C) (12/05 2107) Pulse Rate:  [79-95] 79 (12/05 2107) Cardiac Rhythm: Normal sinus rhythm (12/05 1955) Resp:  [20] 20 (12/05 2107) BP: (144-196)/(73-90) 165/86 (12/05 2107) SpO2:  [95 %-98 %] 97 % (12/05 2107)  CBC:   Recent Labs Lab 02/26/16 1910  WBC 8.6  NEUTROABS 4.3  HGB 12.3  HCT 36.7  MCV 95.1  PLT [id number]    Basic Metabolic Panel:   Recent Labs Lab 02/26/16 1910  NA 133*  K 4.8  CL 102  CO2 22  GLUCOSE 75  BUN 9  CREATININE 1.13*  CALCIUM 9.3    Lipid Panel:     Component Value Date/Time   CHOL 126 02/27/2016 0628   TRIG 80 02/27/2016 0628   HDL 54 02/27/2016 0628   CHOLHDL 2.3 02/27/2016 0628   VLDL 16 02/27/2016 0628   LDLCALC 56 02/27/2016 0628   HgbA1c:  Lab Results  Component Value Date   HGBA1C 5.2 02/27/2016   Urine Drug Screen: No results found for: LABOPIA, COCAINSCRNUR, LABBENZ, AMPHETMU, THCU, LABBARB    IMAGING I have personally reviewed the radiological images below and agree with the radiology interpretations.  Ct Head Wo Contrast 02/26/2016 1. Difficult to exclude an age-indeterminate lacunar infarct in the left basal ganglia. Otherwise, no acute intracranial abnormality. 2. Atrophy and chronic microvascular white matter ischemic changes. 3. Left parietal lobe encephalomalacia.   Ct Angio Neck W Or Wo Contrast 02/27/2016 1. 35% proximal left ICA stenosis. 2. Mild-to-moderate right and severe left proximal vertebral artery stenoses.   Dg Chest Port 1 View 02/26/2016 No active disease.   MRI HEAD 02/26/2016  Acute patchy small LEFT temporal parietal/MCA territory nonhemorrhagic infarct. Old LEFT parietal/MCA territory  infarct. Old RIGHT thalamus infarct. Mild to moderate chronic small vessel ischemic disease.   MRA HEAD 02/26/2016 LEFT M2 occlusion may be acute. Moderate intracranial atherosclerosis. 2 mm LEFT cavernous/meningohypophyseal aneurysm better characterized on prior less motion degraded examination.   2D Echocardiogram  - Left ventricle: The cavity size was normal. Wall thickness was normal. Systolic function was normal. The estimated ejection fraction was in the range of 55% to 60%. Wall motion was normal; there were no regional wall motion abnormalities. Doppler parameters are consistent with abnormal left ventricular relaxation (grade 1 diastolic dysfunction). - Aortic valve: There was trivial regurgitation. - Pericardium, extracardiac: A small pericardial effusion was identified. There was no evidence of hemodynamic compromise.   PHYSICAL EXAM  Temp:  [97.8 F (36.6 C)-98.6 F (37 C)] 98.3 F (36.8 C) (12/05 2107) Pulse Rate:  [79-95] 79 (12/05 2107) Resp:  [20] 20 (12/05 2107) BP: (144-196)/(73-90) 165/86 (12/05 2107) SpO2:  [95 %-98 %] 97 % (12/05 2107)  General - Well nourished, well developed, in no apparent distress.  Ophthalmologic - Fundi not visualized due to noncooperation.  Cardiovascular - Regular rate and rhythm.  Neuro - awake alert, not following commands. Global aphasia, receptive > expressive. Able to have words and short sentences, but slow and with hesitancy, and dysarthria. Not able to repeat or name. PERRL, blinking to visual threat bilaterally. No significant facial asymmetry, tongue in the middle. Moving all extremities equally and bilaterally. DTR 1+ and no babinski. Sensation, coordination and  gait not tested.    ASSESSMENT/PLAN [name] is a 70 y.o. female with history of previous stroke, degenerative joint disease, COPD and carotid aneurysm presenting with nonsensical speech. She did not receive IV t-PA due to delay in arrival.   Stroke:  Patchy  left temporal parietal infarct near previous encephalomalacia, more likely due to large vessel disease from left ICA soft plaque. cardioembolic can not be ruled out.   Resultant  Aphasia, receptive > expressive  MRI  Patchy L temporal parietal/MCA infarct near remote L parietal/MCA. R thalamic infarcts.  MRA  L M2 occlusion. Moderate atherosclerosis. 44mm L cavernous aneurysm  CTA neck 35% proximal L ICA, however, significant soft plaque at origin.   2D Echo  EF 55-60%. No source of embolus   Recommend 30 day cardiac event monitoring as outpt to rule out afib  LDL 56  HgbA1c 5.2  Lovenox 40 mg sq daily for VTE prophylaxis DIET DYS 2 Room service appropriate? Yes; Fluid consistency: Thin  aspirin 81 mg daily prior to admission, now on aspirin 325 mg daily. Recommend DAPT for 3 months and then plavix alone.   Patient counseled to be compliant with her antithrombotic medications  Ongoing aggressive stroke risk factor management  Therapy recommendations:  SNF  Disposition:  pending   Hx of stroke  Remote left parietal infarcts prior to 2004  2004 MRI punctate infarcts at the margin of old left parietal infarcts - MRA left M2 occlusion  2011 MRI negative and MRA normal with left M2 recannulized.  06/2013 MRI negative   02/2016 (this time) - left MCA infarct near the old infarcts, MRA showed left M2 occlusion  Hypertension  cozaar at home  Stable  Permissive hypertension (OK if < 220/120) but gradually normalize in 5-7 days  Long-term BP goal normotensive  Tobacco abuse  Current smoker  Smoking cessation counseling provided  Other Stroke Risk Factors  Advanced age  Other Active Problems  COPD  Depression  GERD  ? Baseline dementia  Hospital day # 2  Neurology will sign off. Please call with questions. Pt will follow up with Dr. Erlinda Bolton at [facility] in about 6 weeks. Thanks for the consult.   Rosalin Hawking, MD PhD Stroke Neurology 02/28/2016 10:28 PM   To  contact Stroke Continuity provider, please refer to [email or web address] After hours, contact General Neurology

## 2016-02-28 NOTE — NC FL2 (Signed)
[place] LEVEL OF CARE SCREENING TOOL     IDENTIFICATION  Patient Name: [name] Birthdate: 1945/10/01 Sex: female Admission Date (Current Location): 02/26/2016  [facility] and [place] Number:  Herbalist and Address:  [facility] [place] [address], [place], [place] [address]      Provider Number: [id number]  Attending Physician Name and Address:  Reyne Dumas, MD  Relative Name and Phone Number:       Current Level of Care: Hospital Recommended Level of Care: [place] Prior Approval Number:    Date Approved/Denied:   PASRR Number:   [id number]   Discharge Plan: SNF    Current Diagnoses: Patient Active Problem List   Diagnosis Date Noted  . Atherosclerosis of left carotid artery   . Cerebral embolism with cerebral infarction 02/27/2016  . Acute encephalopathy 02/26/2016  . Osteoporosis 07/04/2015  . Impaired cognition 03/30/2014  . Vitamin D deficiency 08/18/2013  . Hyperlipidemia 08/18/2013  . Prediabetes 08/18/2013  . Medication management 08/18/2013  . CKD (chronic kidney disease) stage 3, GFR 30-59 ml/min 07/01/2013  . Anemia of chronic disease 07/01/2013  . Depression, major, in remission ([place]) 06/30/2013  . HTN 02/09/2013  . COPD GOLD III 03/28/2012  . Vocal cord dysfunction 03/28/2012  . Carotid artery aneurysm ([place]) 03/04/2012  . Smoker 03/04/2012    Orientation RESPIRATION BLADDER Height & Weight     Self, Place  Normal Continent Weight: 108 lb 3.2 oz (49.1 kg) Height:     BEHAVIORAL SYMPTOMS/MOOD NEUROLOGICAL BOWEL NUTRITION STATUS   pleasant   stable/confused at times Continent Diet (Dysphagia 2 (Fine chop);Thin liquid)  Liquid Administration via: Cup;Straw Medication Administration: Whole meds with puree Supervision: Patient able to self feed   AMBULATORY STATUS COMMUNICATION OF NEEDS Skin   Limited Assist Verbally Normal                       Personal Care Assistance  Level of Assistance  Bathing, Feeding, Dressing Bathing Assistance: Limited assistance Feeding assistance: Limited assistance Dressing Assistance: Limited assistance     Functional Limitations Info  Sight, Hearing, Speech Sight Info: Adequate Hearing Info: Impaired Speech Info: Impaired    SPECIAL CARE FACTORS FREQUENCY  PT (By licensed PT), OT (By licensed OT), Restorative feeding program, Speech therapy     PT Frequency: 5x OT Frequency: 5x     Speech Therapy Frequency: 3x      Contractures Contractures Info: Not present    Additional Factors Info  Code Status, Allergies Code Status Info: Full Code Allergies Info: Amoxapine And Related, Amoxicillin, Ciprocinonide Fluocinolone, Ciprofloxacin, Doxycycline, Erythromycin, Sulfa Antibiotics, Zofran Ondansetron Hcl           Current Medications (02/28/2016):  This is the current hospital active medication list Current Facility-Administered Medications  Medication Dose Route Frequency Provider Last Rate Last Dose  . 0.9 %  sodium chloride infusion   Intravenous Continuous Etta Quill, DO   Stopped at 02/27/16 1900  . aspirin suppository 300 mg  300 mg Rectal Daily Etta Quill, DO   300 mg at 02/27/16 [id number]   Or  . aspirin tablet 325 mg  325 mg Oral Daily Etta Quill, DO   325 mg at 02/28/16 [id number]  . atorvastatin (LIPITOR) tablet 20 mg  20 mg Oral q1800 Rosalin Hawking, MD      . clopidogrel (PLAVIX) tablet 75 mg  75 mg Oral Daily Rosalin Hawking, MD  75 mg at 02/28/16 0904  . enoxaparin (LOVENOX) injection 40 mg  40 mg Subcutaneous Q24H Etta Quill, DO   40 mg at 02/28/16 0908  . famotidine (PEPCID) tablet 20 mg  20 mg Oral Daily Reyne Dumas, MD   20 mg at 02/28/16 0905  . LORazepam (ATIVAN) tablet 0.5 mg  0.5 mg Oral BID PRN Reyne Dumas, MD      . mirtazapine (REMERON) tablet 45 mg  45 mg Oral QHS Reyne Dumas, MD   45 mg at 02/27/16 2035  . montelukast (SINGULAIR) tablet 10 mg  10 mg Oral Daily Reyne Dumas, MD   10  mg at 02/28/16 [id number]     Discharge Medications: Please see discharge summary for a list of discharge medications.  Relevant Imaging Results:  Relevant Lab Results:   Additional Information SSN: [id number]  Lilly Cove, LCSW

## 2016-02-28 NOTE — Clinical Social Work Placement (Signed)
   CLINICAL SOCIAL WORK PLACEMENT  NOTE  Date:  02/28/2016  Patient Details  Name: [name] MRN: [id number] Date of Birth: [birth year]  Clinical Social Work is seeking post-discharge placement for this patient at the [place] level of care (*CSW will initial, date and re-position this form in  chart as items are completed):  Yes   Patient/family provided with [place] Work Department's list of facilities offering this level of care within the geographic area requested by the patient (or if unable, by the patient's family).  Yes   Patient/family informed of their freedom to choose among providers that offer the needed level of care, that participate in Medicare, Medicaid or managed care program needed by the patient, have an available bed and are willing to accept the patient.  Yes   Patient/family informed of [facility]'s ownership interest in [facility] and [facility], as well as of the fact that they are under no obligation to receive care at these facilities.  PASRR submitted to EDS on 02/28/16     PASRR number received on 02/28/16     Existing PASRR number confirmed on       FL2 transmitted to all facilities in geographic area requested by pt/family on 02/28/16     FL2 transmitted to all facilities within larger geographic area on       Patient informed that his/her managed care company has contracts with or will negotiate with certain facilities, including the following:            Patient/family informed of bed offers received.  Patient chooses bed at       Physician recommends and patient chooses bed at      Patient to be transferred to   on  .  Patient to be transferred to facility by       Patient family notified on   of transfer.  Name of family member notified:        PHYSICIAN Please sign FL2     Additional Comment:    _______________________________________________ Lilly Cove, LCSW 02/28/2016, 2:09  PM

## 2016-02-28 NOTE — Progress Notes (Signed)
Daughter called and gave additional supports for patient.  Daughter remains primary contact, however once she leaves this evening she will not be able to return to hospital until next week as her daughter is in the process of having a baby any day.  Thus local friends for patient if needs arise and consent has been given to call: Jan Rogers:  [phone] Nira Retort: [phone]

## 2016-02-28 NOTE — Progress Notes (Addendum)
[facility] PROGRESS NOTE  [name] [id number] DOB: 09/01/1945 DOA: 02/26/2016   PCP: Alesia Richards, MD     Assessment/Plan: Principal Problem:   Acute encephalopathy Active Problems:   HTN   Cerebral embolism with cerebral infarction   Atherosclerosis of left carotid artery    70 y.o. female with a history of previous stroke,, degenerative joint disease, COPD and carotid aneurysm, presenting with new onset speech abnormality. She was last known well on 02/25/16. She was noted to have nonsensical speech output by her daughter this afternoon, and was thought to probably be experiencing a recurrent stroke. CT scan of the head showed no acute intracranial abnormality. Old left parietal area of encephalomalacia was noted. MRI, in addition to old stroke, showed new areas of patchy acute left temporal parietal ischemic infarction. NIH stroke score was 4.  Assessment and plan acute left posterior MCA territory ischemic infarction 1. HgbA1c 5.2, fasting lipid panel -LDL 56 2. PT consult, OT consult, Speech consult 3. Echocardiogram LV EF: 55% -   60% 4. Carotid dopplers pending 5. Prophylactic therapy-Antiplatelet med: Aspirin , Recommend DAPT for 3 months and then plavix alone.  6. Risk factor modification 7. Telemetry monitoring Recommend 30 day cardiac event monitoring as outpt to rule out afib, requested   Hx of stroke  Remote left parietal infarcts prior to 2004  2004 MRI punctate infarcts at the margin of old left parietal infarcts - MRA left M2 occlusion  2011 MRI negative and MRA normal with left M2 recannulized.  06/2013 MRI negative   02/2016 (this time) - left MCA infarct near the old infarcts, MRA showed left M2 occlusion   COPD -resume home medications, including Singulair, currently stable  Depression-holding lorazepam, resume Remeron, Seroquel,  Gastroesophageal reflux disease-continue  Zantac  Hypertension -holding Cozaar and  Accupril    DVT prophylaxsis  Lovenox  Code Status:  Full code      Family Communication: Discussed in detail with the patient, all imaging results, lab results explained to the patient   Disposition Plan: 1-2 days SNF     Consultants:  Neurology  Procedures:  None  Antibiotics: Anti-infectives    None         HPI/Subjective:   still has global aphasia, receptive > expressive, with dysarthria.   Objective: Vitals:   02/28/16 0100 02/28/16 0139 02/28/16 0608 02/28/16 1000  BP: (!) 149/78 (!) 144/73 (!) 196/90 (!) 162/83  Pulse: 88 81 87 95  Resp: 20 20 20 20   Temp: 98.6 F (37 C) 98.5 F (36.9 C) 97.8 F (36.6 C) 98.2 F (36.8 C)  TempSrc: Oral Oral Oral Oral  SpO2: 96% 96% 95% 95%  Weight:        Intake/Output Summary (Last 24 hours) at 02/28/16 1159 Last data filed at 02/28/16 0846  Gross per 24 hour  Intake             2025 ml  Output                0 ml  Net             2025 ml    Exam:  Examination:  General exam: Appears calm and comfortable  Respiratory system: Clear to auscultation. Respiratory effort normal. Cardiovascular system: S1 & S2 heard, RRR. No JVD, murmurs, rubs, gallops or clicks. No pedal edema. Gastrointestinal system: Abdomen is nondistended, soft and nontender. No organomegaly or masses felt. Normal bowel sounds heard. Central nervous  systeexpressive aphasiam:   Extremities: Symmetric 5 x 5 power. Skin: No rashes, lesions or ulcers Psychiatry: Judgement and insight appear normal. Mood & affect appropriate.     Data Reviewed: I have personally reviewed following labs and imaging studies  Micro Results No results found for this or any previous visit (from the past 240 hour(s)).  Radiology Reports Ct Head Wo Contrast  Result Date: 02/26/2016 CLINICAL DATA:  Aphasia. EXAM: CT HEAD WITHOUT CONTRAST TECHNIQUE: Contiguous axial images were obtained from the base of the skull through the vertex without intravenous  contrast. COMPARISON:  MR brain 07/03/2013 and CT head 07/01/2013. FINDINGS: Brain: Difficult to exclude an age-indeterminate lacunar infarct in the left basal ganglia (series 2, image 14). No additional evidence of an acute infarct, acute hemorrhage, mass lesion, mass effect or hydrocephalus. Atrophy. Periventricular low attenuation. Encephalomalacia in the left parietal lobe. Vascular: No hyperdense vessel or unexpected calcification. Skull: Normal. Negative for fracture or focal lesion. Sinuses/Orbits: No acute finding. Other: None. IMPRESSION: 1. Difficult to exclude an age-indeterminate lacunar infarct in the left basal ganglia. Otherwise, no acute intracranial abnormality. 2. Atrophy and chronic microvascular white matter ischemic changes. 3. Left parietal lobe encephalomalacia. Electronically Signed   By: Lorin Picket M.D.   On: 02/26/2016 19:11   Ct Angio Neck W Or Wo Contrast  Result Date: 02/27/2016 CLINICAL DATA:  Speech difficulty. Acute left temporoparietal MCA infarct on MRI. EXAM: CT ANGIOGRAPHY NECK TECHNIQUE: Multidetector CT imaging of the neck was performed using the standard protocol during bolus administration of intravenous contrast. Multiplanar CT image reconstructions and MIPs were obtained to evaluate the vascular anatomy. Carotid stenosis measurements (when applicable) are obtained utilizing NASCET criteria, using the distal internal carotid diameter as the denominator. CONTRAST:  50 mL Isovue 370 COMPARISON:  Soft tissue neck CT 12/28/2009. Head MRI/ MRA 02/26/2016. FINDINGS: Aortic arch: Common origin of the brachiocephalic and left common carotid arteries, a normal variant. Moderate calcified and noncalcified atherosclerotic plaque in the aortic arch. Arch vessel origins are widely patent. Right carotid system: Patent without evidence of stenosis or dissection. Left carotid system: Patent without evidence of dissection. Mild non-stenotic plaque in the mid to distal common carotid  artery. Eccentric plaque located along the posteromedial wall of the proximal ICA results in 35% stenosis, with a prominent amount of soft plaque noted. The amount of plaque and luminal narrowing at this level has mildly increased from the 2011 neck CT. Partially visualized left greater than right carotid siphon atherosclerosis, more fully evaluated on recent head MRA. Vertebral arteries: Vertebral arteries are patent with the right being mildly dominant. There is mild to moderate right and severe left vertebral artery stenosis proximally near their origins. Apparent 1.5 mm outpouching projecting anteriorly from the right V4 segment corresponds to focal calcification on yesterday's head CT as well as the prior neck CT and is felt to reflect atherosclerotic plaque rather than a tiny aneurysm. No aneurysm was apparent in this location on yesterday's MRA. Skeleton: Mild cervical spondylosis. Other neck: No neck mass or lymph node enlargement. Upper chest: Moderate emphysema. IMPRESSION: 1. 35% proximal left ICA stenosis. 2. Mild-to-moderate right and severe left proximal vertebral artery stenoses. Electronically Signed   By: Logan Bores M.D.   On: 02/27/2016 11:52   Mr Brain Wo Contrast  Addendum Date: 02/26/2016   ADDENDUM REPORT: 02/26/2016 23:44 ADDENDUM: Acute findings discussed with and reconfirmed by Dr.JARED GARDNER on 02/26/2016 at 11:40pm. Electronically Signed   By: Elon Alas M.D.   On: 02/26/2016  23:44   Result Date: 02/26/2016 CLINICAL DATA:  Altered speech, aphasic. History of stroke, hypertension, carotid artery aneurysm. EXAM: MRI HEAD WITHOUT CONTRAST MRA HEAD WITHOUT CONTRAST TECHNIQUE: Multiplanar, multiecho pulse sequences of the brain and surrounding structures were obtained without intravenous contrast. Angiographic images of the head were obtained using MRA technique without contrast. COMPARISON:  CT HEAD February 26, 2016 at 1858 hours and, MRI of the head July 03, 2013 and MRA head  March 10, 2010 moderate stenosis in luminal irregularity RIGHT M1 segment. FINDINGS: MRI HEAD FINDINGS BRAIN: Patchy reduced diffusion LEFT temporoparietal lobe extending to the periventricular white matter. 11 mm ovoid focus of reduced diffusion LEFT posterior corona radiata. Areas of diffusion abnormality demonstrate low ADC values. Faint susceptibility artifact LEFT parietal lobe at site of old LEFT parietal lobe infarct with encephalomalacia. Old RIGHT thalamus lacunar infarct. Ex vacuo dilatation subjacent LEFT lateral ventricle, no hydrocephalus. No suspicious parenchymal signal, masses or mass effect. Similar patchy to confluent supratentorial pontine white matter FLAIR T2 hyperintensities. No abnormal extra-axial fluid collections. No extra-axial masses though, contrast enhanced sequences would be more sensitive. VASCULAR: Normal major intracranial vascular flow voids present at skull base. SKULL AND UPPER CERVICAL SPINE: No abnormal sellar expansion. No suspicious calvarial bone marrow signal. Craniocervical junction maintained. SINUSES/ORBITS: The mastoid air-cells and included paranasal sinuses are well-aerated. Status post bilateral ocular lens implants. The included ocular globes and orbital contents are non-suspicious. OTHER: Patient is edentulous. MRA HEAD FINDINGS- mild motion degraded examination. ANTERIOR CIRCULATION: Flow related enhancement of the included cervical, petrous, cavernous and supraclinoid internal carotid arteries. Moderate stenosis LEFT cavernous internal carotid artery and, moderate stenosis LEFT greater than RIGHT supraclinoid internal carotid arteries. 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 Moderate luminal irregularity of the anterior cerebral arteries. Patent anterior communicating artery. Moderate stenosis proximal LEFT A2 segment. Moderate stenosis distal LEFT M1 segment. LEFT  M2 segment occlusion. Moderate tandem stenosis RIGHT M1 segment. POSTERIOR CIRCULATION: RIGHT vertebral artery is dominant. Basilar artery is patent, with normal flow related enhancement of the main branch vessels. Normal flow related enhancement of the posterior cerebral arteries. Moderate luminal irregularity of the posterior cerebral arteries. No aneurysm. IMPRESSION: MRI HEAD: Acute patchy small LEFT temporal parietal/MCA territory nonhemorrhagic infarct. 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 Mild to moderate chronic small vessel ischemic disease. MRA HEAD: LEFT M2 occlusion may be acute. Moderate intracranial atherosclerosis. 2 mm LEFT cavernous/meningohypophyseal aneurysm better characterized on prior less motion degraded examination. Electronically Signed: By: Elon Alas M.D. On: 02/26/2016 23:32   Dg Chest Port 1 View  Result Date: 02/26/2016 CLINICAL DATA:  Chest pain today. EXAM: PORTABLE CHEST 1 VIEW COMPARISON:  Or FINDINGS: The heart size and mediastinal contours are within normal limits. Aortic atherosclerosis. Both lungs are clear. No evidence of pneumothorax or pleural effusion. IMPRESSION: No active disease. Electronically Signed   By: Earle Gell M.D.   On: 02/26/2016 21:19   Mr Jodene Nam Head/brain [id number] Cm  Addendum Date: 02/26/2016   ADDENDUM REPORT: 02/26/2016 23:44 ADDENDUM: Acute findings discussed with and reconfirmed by Dr.JARED GARDNER on 02/26/2016 at 11:40pm. Electronically Signed   By: Elon Alas M.D.   On: 02/26/2016 23:44   Result Date: 02/26/2016 CLINICAL DATA:  Altered speech, aphasic. History of stroke, hypertension, carotid artery aneurysm. EXAM: MRI HEAD WITHOUT CONTRAST MRA HEAD WITHOUT CONTRAST TECHNIQUE: Multiplanar, multiecho pulse sequences of the brain and surrounding structures were obtained without  intravenous contrast. Angiographic images of the head were obtained using MRA technique without contrast. COMPARISON:  CT HEAD  February 26, 2016 at 1858 hours and, MRI of the head July 03, 2013 and MRA head March 10, 2010 moderate stenosis in luminal irregularity RIGHT M1 segment. FINDINGS: MRI HEAD FINDINGS BRAIN: Patchy reduced diffusion LEFT temporoparietal lobe extending to the periventricular white matter. 11 mm ovoid focus of reduced diffusion LEFT posterior corona radiata. Areas of diffusion abnormality demonstrate low ADC values. Faint susceptibility artifact LEFT parietal lobe at site of old LEFT parietal lobe infarct with encephalomalacia. Old RIGHT thalamus lacunar infarct. Ex vacuo dilatation subjacent LEFT lateral ventricle, no hydrocephalus. No suspicious parenchymal signal, masses or mass effect. Similar patchy to confluent supratentorial pontine white matter FLAIR T2 hyperintensities. No abnormal extra-axial fluid collections. No extra-axial masses though, contrast enhanced sequences would be more sensitive. VASCULAR: Normal major intracranial vascular flow voids present at skull base. SKULL AND UPPER CERVICAL SPINE: No abnormal sellar expansion. No suspicious calvarial bone marrow signal. Craniocervical junction maintained. SINUSES/ORBITS: The mastoid air-cells and included paranasal sinuses are well-aerated. Status post bilateral ocular lens implants. The included ocular globes and orbital contents are non-suspicious. OTHER: Patient is edentulous. MRA HEAD FINDINGS- mild motion degraded examination. ANTERIOR CIRCULATION: Flow related enhancement of the included cervical, petrous, cavernous and supraclinoid internal carotid arteries. Moderate stenosis LEFT cavernous internal carotid artery and, moderate stenosis LEFT greater than RIGHT supraclinoid internal carotid arteries. 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 Moderate luminal irregularity of the anterior cerebral arteries. Patent anterior communicating artery. Moderate  stenosis proximal LEFT A2 segment. Moderate stenosis distal LEFT M1 segment. LEFT M2 segment occlusion. Moderate tandem stenosis RIGHT M1 segment. POSTERIOR CIRCULATION: RIGHT vertebral artery is dominant. Basilar artery is patent, with normal flow related enhancement of the main branch vessels. Normal flow related enhancement of the posterior cerebral arteries. Moderate luminal irregularity of the posterior cerebral arteries. No aneurysm. IMPRESSION: MRI HEAD: Acute patchy small LEFT temporal parietal/MCA territory nonhemorrhagic infarct. 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 Mild to moderate chronic small vessel ischemic disease. MRA HEAD: LEFT M2 occlusion may be acute. Moderate intracranial atherosclerosis. 2 mm LEFT cavernous/meningohypophyseal aneurysm better characterized on prior less motion degraded examination. Electronically Signed: By: Elon Alas M.D. On: 02/26/2016 23:32     CBC  Recent Labs Lab 02/26/16 1910  WBC 8.6  HGB 12.3  HCT 36.7  PLT 281  MCV 95.1  MCH 31.9  MCHC 33.5  RDW 13.4  LYMPHSABS 2.9  MONOABS 1.0  EOSABS 0.3  BASOSABS 0.1    Chemistries   Recent Labs Lab 02/26/16 1910  NA 133*  K 4.8  CL 102  CO2 22  GLUCOSE 75  BUN 9  CREATININE 1.13*  CALCIUM 9.3  AST 22  ALT 10*  ALKPHOS 60  BILITOT 0.3   ------------------------------------------------------------------------------------------------------------------ estimated creatinine clearance is 32.3 mL/min (by C-G formula based on SCr of 1.13 mg/dL (H)). ------------------------------------------------------------------------------------------------------------------  Recent Labs  02/27/16 0628  HGBA1C 5.2   ------------------------------------------------------------------------------------------------------------------  Recent Labs  02/27/16 0628  CHOL 126  HDL 54  LDLCALC 56  TRIG 80  CHOLHDL 2.3    ------------------------------------------------------------------------------------------------------------------ No results for input(s): TSH, T4TOTAL, T3FREE, THYROIDAB in the last 72 hours.  Invalid input(s): FREET3 ------------------------------------------------------------------------------------------------------------------ No results for input(s): VITAMINB12, FOLATE, FERRITIN, TIBC, IRON, RETICCTPCT in the last 72 hours.  Coagulation profile  Recent Labs Lab 02/26/16 1910  INR  0.98    No results for input(s): DDIMER in the last 72 hours.  Cardiac Enzymes No results for input(s): CKMB, TROPONINI, MYOGLOBIN in the last 168 hours.  Invalid input(s): CK ------------------------------------------------------------------------------------------------------------------ Invalid input(s): POCBNP   CBG: No results for input(s): GLUCAP in the last 168 hours.     Studies: Ct Head Wo Contrast  Result Date: 02/26/2016 CLINICAL DATA:  Aphasia. EXAM: CT HEAD WITHOUT CONTRAST TECHNIQUE: Contiguous axial images were obtained from the base of the skull through the vertex without intravenous contrast. COMPARISON:  MR brain 07/03/2013 and CT head 07/01/2013. FINDINGS: Brain: Difficult to exclude an age-indeterminate lacunar infarct in the left basal ganglia (series 2, image 14). No additional evidence of an acute infarct, acute hemorrhage, mass lesion, mass effect or hydrocephalus. Atrophy. Periventricular low attenuation. Encephalomalacia in the left parietal lobe. Vascular: No hyperdense vessel or unexpected calcification. Skull: Normal. Negative for fracture or focal lesion. Sinuses/Orbits: No acute finding. Other: None. IMPRESSION: 1. Difficult to exclude an age-indeterminate lacunar infarct in the left basal ganglia. Otherwise, no acute intracranial abnormality. 2. Atrophy and chronic microvascular white matter ischemic changes. 3. Left parietal lobe encephalomalacia. Electronically  Signed   By: Lorin Picket M.D.   On: 02/26/2016 19:11   Ct Angio Neck W Or Wo Contrast  Result Date: 02/27/2016 CLINICAL DATA:  Speech difficulty. Acute left temporoparietal MCA infarct on MRI. EXAM: CT ANGIOGRAPHY NECK TECHNIQUE: Multidetector CT imaging of the neck was performed using the standard protocol during bolus administration of intravenous contrast. Multiplanar CT image reconstructions and MIPs were obtained to evaluate the vascular anatomy. Carotid stenosis measurements (when applicable) are obtained utilizing NASCET criteria, using the distal internal carotid diameter as the denominator. CONTRAST:  50 mL Isovue 370 COMPARISON:  Soft tissue neck CT 12/28/2009. Head MRI/ MRA 02/26/2016. FINDINGS: Aortic arch: Common origin of the brachiocephalic and left common carotid arteries, a normal variant. Moderate calcified and noncalcified atherosclerotic plaque in the aortic arch. Arch vessel origins are widely patent. Right carotid system: Patent without evidence of stenosis or dissection. Left carotid system: Patent without evidence of dissection. Mild non-stenotic plaque in the mid to distal common carotid artery. Eccentric plaque located along the posteromedial wall of the proximal ICA results in 35% stenosis, with a prominent amount of soft plaque noted. The amount of plaque and luminal narrowing at this level has mildly increased from the 2011 neck CT. Partially visualized left greater than right carotid siphon atherosclerosis, more fully evaluated on recent head MRA. Vertebral arteries: Vertebral arteries are patent with the right being mildly dominant. There is mild to moderate right and severe left vertebral artery stenosis proximally near their origins. Apparent 1.5 mm outpouching projecting anteriorly from the right V4 segment corresponds to focal calcification on yesterday's head CT as well as the prior neck CT and is felt to reflect atherosclerotic plaque rather than a tiny aneurysm. No  aneurysm was apparent in this location on yesterday's MRA. Skeleton: Mild cervical spondylosis. Other neck: No neck mass or lymph node enlargement. Upper chest: Moderate emphysema. IMPRESSION: 1. 35% proximal left ICA stenosis. 2. Mild-to-moderate right and severe left proximal vertebral artery stenoses. Electronically Signed   By: Logan Bores M.D.   On: 02/27/2016 11:52   Mr Brain Wo Contrast  Addendum Date: 02/26/2016   ADDENDUM REPORT: 02/26/2016 23:44 ADDENDUM: Acute findings discussed with and reconfirmed by Dr.JARED GARDNER on 02/26/2016 at 11:40pm. Electronically Signed   By: Elon Alas M.D.   On: 02/26/2016 23:44   Result Date: 02/26/2016  CLINICAL DATA:  Altered speech, aphasic. History of stroke, hypertension, carotid artery aneurysm. EXAM: MRI HEAD WITHOUT CONTRAST MRA HEAD WITHOUT CONTRAST TECHNIQUE: Multiplanar, multiecho pulse sequences of the brain and surrounding structures were obtained without intravenous contrast. Angiographic images of the head were obtained using MRA technique without contrast. COMPARISON:  CT HEAD February 26, 2016 at 1858 hours and, MRI of the head July 03, 2013 and MRA head March 10, 2010 moderate stenosis in luminal irregularity RIGHT M1 segment. FINDINGS: MRI HEAD FINDINGS BRAIN: Patchy reduced diffusion LEFT temporoparietal lobe extending to the periventricular white matter. 11 mm ovoid focus of reduced diffusion LEFT posterior corona radiata. Areas of diffusion abnormality demonstrate low ADC values. Faint susceptibility artifact LEFT parietal lobe at site of old LEFT parietal lobe infarct with encephalomalacia. Old RIGHT thalamus lacunar infarct. Ex vacuo dilatation subjacent LEFT lateral ventricle, no hydrocephalus. No suspicious parenchymal signal, masses or mass effect. Similar patchy to confluent supratentorial pontine white matter FLAIR T2 hyperintensities. No abnormal extra-axial fluid collections. No extra-axial masses though, contrast enhanced  sequences would be more sensitive. VASCULAR: Normal major intracranial vascular flow voids present at skull base. SKULL AND UPPER CERVICAL SPINE: No abnormal sellar expansion. No suspicious calvarial bone marrow signal. Craniocervical junction maintained. SINUSES/ORBITS: The mastoid air-cells and included paranasal sinuses are well-aerated. Status post bilateral ocular lens implants. The included ocular globes and orbital contents are non-suspicious. OTHER: Patient is edentulous. MRA HEAD FINDINGS- mild motion degraded examination. ANTERIOR CIRCULATION: Flow related enhancement of the included cervical, petrous, cavernous and supraclinoid internal carotid arteries. Moderate stenosis LEFT cavernous internal carotid artery and, moderate stenosis LEFT greater than RIGHT supraclinoid internal carotid arteries. 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 Moderate luminal irregularity of the anterior cerebral arteries. Patent anterior communicating artery. Moderate stenosis proximal LEFT A2 segment. Moderate stenosis distal LEFT M1 segment. LEFT M2 segment occlusion. Moderate tandem stenosis RIGHT M1 segment. POSTERIOR CIRCULATION: RIGHT vertebral artery is dominant. Basilar artery is patent, with normal flow related enhancement of the main branch vessels. Normal flow related enhancement of the posterior cerebral arteries. Moderate luminal irregularity of the posterior cerebral arteries. No aneurysm. IMPRESSION: MRI HEAD: Acute patchy small LEFT temporal parietal/MCA territory nonhemorrhagic infarct. 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 Mild to moderate chronic small vessel ischemic disease. MRA HEAD: LEFT M2 occlusion may be acute. Moderate intracranial atherosclerosis. 2 mm LEFT cavernous/meningohypophyseal aneurysm better characterized on prior less motion degraded examination. Electronically  Signed: By: Elon Alas M.D. On: 02/26/2016 23:32   Dg Chest Port 1 View  Result Date: 02/26/2016 CLINICAL DATA:  Chest pain today. EXAM: PORTABLE CHEST 1 VIEW COMPARISON:  Or FINDINGS: The heart size and mediastinal contours are within normal limits. Aortic atherosclerosis. Both lungs are clear. No evidence of pneumothorax or pleural effusion. IMPRESSION: No active disease. Electronically Signed   By: Earle Gell M.D.   On: 02/26/2016 21:19   Mr Jodene Nam Head/brain [id number] Cm  Addendum Date: 02/26/2016   ADDENDUM REPORT: 02/26/2016 23:44 ADDENDUM: Acute findings discussed with and reconfirmed by Dr.JARED GARDNER on 02/26/2016 at 11:40pm. Electronically Signed   By: Elon Alas M.D.   On: 02/26/2016 23:44   Result Date: 02/26/2016 CLINICAL DATA:  Altered speech, aphasic. History of stroke, hypertension, carotid artery aneurysm. EXAM: MRI HEAD WITHOUT CONTRAST MRA HEAD WITHOUT CONTRAST TECHNIQUE: Multiplanar, multiecho pulse sequences of the brain and surrounding structures were obtained without intravenous contrast. Angiographic images of the  head were obtained using MRA technique without contrast. COMPARISON:  CT HEAD February 26, 2016 at 1858 hours and, MRI of the head July 03, 2013 and MRA head March 10, 2010 moderate stenosis in luminal irregularity RIGHT M1 segment. FINDINGS: MRI HEAD FINDINGS BRAIN: Patchy reduced diffusion LEFT temporoparietal lobe extending to the periventricular white matter. 11 mm ovoid focus of reduced diffusion LEFT posterior corona radiata. Areas of diffusion abnormality demonstrate low ADC values. Faint susceptibility artifact LEFT parietal lobe at site of old LEFT parietal lobe infarct with encephalomalacia. Old RIGHT thalamus lacunar infarct. Ex vacuo dilatation subjacent LEFT lateral ventricle, no hydrocephalus. No suspicious parenchymal signal, masses or mass effect. Similar patchy to confluent supratentorial pontine white matter FLAIR T2 hyperintensities. No abnormal  extra-axial fluid collections. No extra-axial masses though, contrast enhanced sequences would be more sensitive. VASCULAR: Normal major intracranial vascular flow voids present at skull base. SKULL AND UPPER CERVICAL SPINE: No abnormal sellar expansion. No suspicious calvarial bone marrow signal. Craniocervical junction maintained. SINUSES/ORBITS: The mastoid air-cells and included paranasal sinuses are well-aerated. Status post bilateral ocular lens implants. The included ocular globes and orbital contents are non-suspicious. OTHER: Patient is edentulous. MRA HEAD FINDINGS- mild motion degraded examination. ANTERIOR CIRCULATION: Flow related enhancement of the included cervical, petrous, cavernous and supraclinoid internal carotid arteries. Moderate stenosis LEFT cavernous internal carotid artery and, moderate stenosis LEFT greater than RIGHT supraclinoid internal carotid arteries. 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 Moderate luminal irregularity of the anterior cerebral arteries. Patent anterior communicating artery. Moderate stenosis proximal LEFT A2 segment. Moderate stenosis distal LEFT M1 segment. LEFT M2 segment occlusion. Moderate tandem stenosis RIGHT M1 segment. POSTERIOR CIRCULATION: RIGHT vertebral artery is dominant. Basilar artery is patent, with normal flow related enhancement of the main branch vessels. Normal flow related enhancement of the posterior cerebral arteries. Moderate luminal irregularity of the posterior cerebral arteries. No aneurysm. IMPRESSION: MRI HEAD: Acute patchy small LEFT temporal parietal/MCA territory nonhemorrhagic infarct. 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 Mild to moderate chronic small vessel ischemic disease. MRA HEAD: LEFT M2 occlusion may be acute. Moderate intracranial atherosclerosis. 2 mm LEFT cavernous/meningohypophyseal aneurysm  better characterized on prior less motion degraded examination. Electronically Signed: By: Elon Alas M.D. On: 02/26/2016 23:32      Lab Results  Component Value Date   HGBA1C 5.2 02/27/2016   HGBA1C 5.0 01/25/2016   HGBA1C 5.6 10/25/2015   Lab Results  Component Value Date   MICROALBUR 0.2 07/07/2015   LDLCALC 56 02/27/2016   CREATININE 1.13 (H) 02/26/2016       Scheduled Meds: . aspirin  300 mg Rectal Daily   Or  . aspirin  325 mg Oral Daily  . atorvastatin  20 mg Oral q1800  . clopidogrel  75 mg Oral Daily  . enoxaparin (LOVENOX) injection  40 mg Subcutaneous Q24H  . famotidine  20 mg Oral Daily  . mirtazapine  45 mg Oral QHS  . montelukast  10 mg Oral Daily   Continuous Infusions: . sodium chloride Stopped (02/27/16 1900)     LOS: 2 days    Time spent: >30 MINS    [facility]  [facility] Pager [phone]. If 7PM-7AM, please contact night-coverage at [email or web address], password [facility] 02/28/2016, 11:59 AM  LOS: 2 days

## 2016-02-28 NOTE — Clinical Social Work Note (Signed)
Clinical Social Work Assessment  Patient Details  Name: [name] MRN: [id number] Date of Birth: 1945/12/23  Date of referral:  02/28/16               Reason for consult:  Facility Placement, Discharge Planning                Permission sought to share information with:  Case Manager, Facility Sport and exercise psychologist, Family Supports Permission granted to share information::  Yes, Verbal Permission Granted  Name::        Agency::     Relationship::  [name] Daughter:  [phone]  Contact Information:     Housing/Transportation Living arrangements for the past 2 months:  [place] of Information:  Patient, Medical Team, Case Manager, Adult Children Patient Interpreter Needed:  None Criminal Activity/Legal Involvement Pertinent to Current Situation/Hospitalization:  No - Comment as needed Significant Relationships:  Adult Children, Other Family Members, Friend Lives with:  Self Do you feel safe going back to the place where you live?  No Need for family participation in patient care:  Yes (Comment)  Care giving concerns:  LCSW met with daughter [name] at the bedside. [name] lives in [place] and talks to patient daily at [place]. Patient was overall independent prior to stroke. She was driving, walking, and caring for self in her home alone. She has a small dog who is family whom lives with her as well. At the present time, patient is positive for stroke and has deficits in mobility, speech, and comprehension making it difficult for her to return home safely at this time. Daughter is agreeable to recommendations for SNF in the local [place] area. Daughter reports she is available by phone and can see patient, but her daughter is due any day with her granddaughter and she is working full time in [place].    Social Worker assessment / plan:  LCSW explained role and reason for consult and completed assessment of needs. At this time SNF being recommended and  daughter agreeable to plan. Daughter is the Set designer for patient and already has POA of patient's fiances.  Discussed in detail recommendations, next level of care and SNF benefit with daughter. Patient interjected during conversation several times and was observed to be frustrated and confused.  LCSW provided emotional support and attempted to explain to patient plans and treatment, but her cognitive level is waxing and waning and she is unable to understand.  Daughter will be voice of patient and available by phone and e-mail. LCSW completed SNF work up. Will follow up with bed offers.  Employment status:  Retired Nurse, adult PT Recommendations:  [place] / Referral to community resources:  [place]  Patient/Family's Response to care:  Agreeable to plan  Patient/Family's Understanding of and Emotional Response to Diagnosis, Current Treatment, and Prognosis:  Patient very tearful and struggles to find her words in conversation. Daughter is understanding of limitations and understands that patient is positive for stroke and will need SNF at [place].  Emotional Assessment Appearance:  Appears stated age Attitude/Demeanor/Rapport:  Other (frustrated with speech, but overall very pleasant and cooperative) Affect (typically observed):  Anxious, Apprehensive, Pleasant Orientation:  Oriented to Self, Oriented to Place Alcohol / Substance use:  Not Applicable Psych involvement (Current and /or in the community):  No (Comment)  Discharge Needs  Concerns to be addressed:  No discharge needs identified Readmission within the last 30 days:  No Current discharge  risk:  None Barriers to Discharge:  Ship broker, Continued Medical Work up   Marshell Garfinkel 02/28/2016, 2:04 PM

## 2016-02-29 DIAGNOSIS — I6789 Other cerebrovascular disease: Secondary | ICD-10-CM | POA: Diagnosis not present

## 2016-02-29 DIAGNOSIS — I739 Peripheral vascular disease, unspecified: Secondary | ICD-10-CM | POA: Diagnosis not present

## 2016-02-29 DIAGNOSIS — I10 Essential (primary) hypertension: Secondary | ICD-10-CM | POA: Diagnosis not present

## 2016-02-29 DIAGNOSIS — I69320 Aphasia following cerebral infarction: Secondary | ICD-10-CM | POA: Diagnosis not present

## 2016-02-29 DIAGNOSIS — K219 Gastro-esophageal reflux disease without esophagitis: Secondary | ICD-10-CM | POA: Diagnosis not present

## 2016-02-29 DIAGNOSIS — I639 Cerebral infarction, unspecified: Secondary | ICD-10-CM | POA: Diagnosis not present

## 2016-02-29 DIAGNOSIS — F0390 Unspecified dementia without behavioral disturbance: Secondary | ICD-10-CM | POA: Diagnosis not present

## 2016-02-29 DIAGNOSIS — R1312 Dysphagia, oropharyngeal phase: Secondary | ICD-10-CM | POA: Diagnosis not present

## 2016-02-29 DIAGNOSIS — J449 Chronic obstructive pulmonary disease, unspecified: Secondary | ICD-10-CM | POA: Diagnosis not present

## 2016-02-29 DIAGNOSIS — G9340 Encephalopathy, unspecified: Secondary | ICD-10-CM | POA: Diagnosis not present

## 2016-02-29 DIAGNOSIS — E785 Hyperlipidemia, unspecified: Secondary | ICD-10-CM | POA: Diagnosis not present

## 2016-02-29 DIAGNOSIS — I6522 Occlusion and stenosis of left carotid artery: Secondary | ICD-10-CM | POA: Diagnosis not present

## 2016-02-29 DIAGNOSIS — M6281 Muscle weakness (generalized): Secondary | ICD-10-CM | POA: Diagnosis not present

## 2016-02-29 DIAGNOSIS — M810 Age-related osteoporosis without current pathological fracture: Secondary | ICD-10-CM | POA: Diagnosis not present

## 2016-02-29 DIAGNOSIS — F322 Major depressive disorder, single episode, severe without psychotic features: Secondary | ICD-10-CM | POA: Diagnosis not present

## 2016-02-29 DIAGNOSIS — I720 Aneurysm of carotid artery: Secondary | ICD-10-CM | POA: Diagnosis not present

## 2016-02-29 DIAGNOSIS — R1310 Dysphagia, unspecified: Secondary | ICD-10-CM | POA: Diagnosis not present

## 2016-02-29 LAB — COMPREHENSIVE METABOLIC PANEL
ALK PHOS: 61 U/L (ref 38–126)
ALT: 12 U/L — AB (ref 14–54)
ANION GAP: 9 (ref 5–15)
AST: 37 U/L (ref 15–41)
Albumin: 3.8 g/dL (ref 3.5–5.0)
BILIRUBIN TOTAL: 0.5 mg/dL (ref 0.3–1.2)
BUN: 9 mg/dL (ref 6–20)
CALCIUM: 9.3 mg/dL (ref 8.9–10.3)
CO2: 25 mmol/L (ref 22–32)
CREATININE: 1.06 mg/dL — AB (ref 0.44–1.00)
Chloride: 99 mmol/L — ABNORMAL LOW (ref 101–111)
GFR, EST AFRICAN AMERICAN: 59 mL/min — AB (ref >60)
GFR, EST NON AFRICAN AMERICAN: 51 mL/min — AB (ref >60)
Glucose, Bld: 107 mg/dL — ABNORMAL HIGH (ref 65–99)
Potassium: 4.1 mmol/L (ref 3.5–5.1)
SODIUM: 133 mmol/L — AB (ref 135–145)
TOTAL PROTEIN: 6.4 g/dL — AB (ref 6.5–8.1)

## 2016-02-29 LAB — CBC
HCT: 35.9 % — ABNORMAL LOW (ref 36.0–46.0)
HEMOGLOBIN: 12.5 g/dL (ref 12.0–15.0)
MCH: 32 pg (ref 26.0–34.0)
MCHC: 34.8 g/dL (ref 30.0–36.0)
MCV: 91.8 fL (ref 78.0–100.0)
PLATELETS: 292 10*3/uL (ref 150–400)
RBC: 3.91 MIL/uL (ref 3.87–5.11)
RDW: 12.7 % (ref 11.5–15.5)
WBC: 9.2 10*3/uL (ref 4.0–10.5)

## 2016-02-29 MED ORDER — GABAPENTIN 300 MG PO CAPS: 300.0000 mg | ORAL_CAPSULE | Freq: Every day | ORAL | 0 refills | Status: DC

## 2016-02-29 MED ORDER — TRAMADOL HCL 50 MG PO TABS: 50.0000 mg | ORAL_TABLET | Freq: Two times a day (BID) | ORAL | 0 refills | Status: DC | PRN

## 2016-02-29 MED ORDER — PANTOPRAZOLE SODIUM 40 MG PO TBEC: 40.0000 mg | DELAYED_RELEASE_TABLET | Freq: Every day | ORAL | 1 refills | Status: DC

## 2016-02-29 MED ORDER — CLOPIDOGREL BISULFATE 75 MG PO TABS: 75.0000 mg | ORAL_TABLET | Freq: Every day | ORAL | 1 refills | Status: DC

## 2016-02-29 MED ORDER — LORAZEPAM 0.5 MG PO TABS: 0.5000 mg | ORAL_TABLET | Freq: Two times a day (BID) | ORAL | 0 refills | Status: DC | PRN

## 2016-02-29 MED ORDER — ASPIRIN 325 MG PO TABS: 325.0000 mg | ORAL_TABLET | Freq: Every day | ORAL | 2 refills | Status: DC

## 2016-02-29 MED ORDER — ATORVASTATIN CALCIUM 20 MG PO TABS: 20.0000 mg | ORAL_TABLET | Freq: Every day | ORAL | 1 refills | Status: DC

## 2016-02-29 NOTE — H&P (Signed)
Report called to [facility] staff. Pt dc'd to transport staff in no obvious distress, at baseline.

## 2016-02-29 NOTE — Discharge Summary (Addendum)
Physician Discharge Summary  [name] MRN: [id number] DOB/AGE: Jan 31, 1946 70 y.o.  PCP: Alesia Richards, MD   Admit date: 02/26/2016 Discharge date: 02/29/2016  Discharge Diagnoses:    Principal Problem:   Acute encephalopathy Active Problems:   HTN   Cerebral embolism with cerebral infarction   Carotid atherosclerosis, left    Follow-up recommendations Follow-up with PCP in 3-5 days , including all  additional recommended appointments as below Follow-up CBC, CMP in 3-5 days Patient scheduled to receive 30 day cardiac event monitoring at [facility] cardiology Recommend DAPT for 3 months and then plavix alone.  Pt will follow up with Dr. Erlinda Hong at [facility] in about 6 weeks     Current Discharge Medication List    START taking these medications   Details  atorvastatin (LIPITOR) 20 MG tablet Take 1 tablet (20 mg total) by mouth daily at 6 PM. Qty: 30 tablet, Refills: 1    clopidogrel (PLAVIX) 75 MG tablet Take 1 tablet (75 mg total) by mouth daily. Qty: 30 tablet, Refills: 1    gabapentin (NEURONTIN) 300 MG capsule Take 1 capsule (300 mg total) by mouth at bedtime. Qty: 30 capsule, Refills: 0    pantoprazole (PROTONIX) 40 MG tablet Take 1 tablet (40 mg total) by mouth daily. Qty: 30 tablet, Refills: 1      CONTINUE these medications which have CHANGED   Details  LORazepam (ATIVAN) 0.5 MG tablet Take 1 tablet (0.5 mg total) by mouth 2 (two) times daily as needed for anxiety. Qty: 30 tablet, Refills: 0    traMADol (ULTRAM) 50 MG tablet Take 1 tablet (50 mg total) by mouth 2 (two) times daily as needed. Qty: 60 tablet, Refills: 0   Associated Diagnoses: Bilateral back pain      CONTINUE these medications which have NOT CHANGED   Details  losartan (COZAAR) 100 MG tablet TAKE 1 TABLET BY MOUTH EVERY DAY FOR BLOOD PRESSURE Qty: 90 tablet, Refills: 1    mirtazapine (REMERON) 45 MG tablet TAKE 1 TABLET BY MOUTH EVERY NIGHT AT BEDTIME Qty: 90 tablet, Refills: 0     montelukast (SINGULAIR) 10 MG tablet TAKE 1 TABLET BY MOUTH EVERY DAY Qty: 90 tablet, Refills: 0    oxybutynin (DITROPAN) 5 MG tablet TAKE 1/2 TO 1 TABLET BY MOUTH THREE TIMES DAILY FOR BLADDER CONTROL Qty: 270 tablet, Refills: 0    QUEtiapine (SEROQUEL) 400 MG tablet TAKE 1 TABLET BY MOUTH EVERY NIGHT AT BEDTIME Qty: 90 tablet, Refills: 1    aspirin EC 325 MG tablet Take 325 mg by mouth daily.    b complex vitamins tablet Take 1 tablet by mouth daily.    cholecalciferol (VITAMIN D) 1000 UNITS tablet Take 5,000 Units by mouth daily.    Magnesium 500 MG TABS Take 500 mg tablet twice daily with meals. Qty: 100 tablet, Refills: 11    vitamin C (ASCORBIC ACID) 500 MG tablet Take 500 mg by mouth daily.      STOP taking these medications     gabapentin (NEURONTIN) 600 MG tablet      quinapril (ACCUPRIL) 40 MG tablet      ranitidine (ZANTAC) 300 MG tablet          Discharge Condition: Stable   Discharge Instructions Get Medicines reviewed and adjusted: Please take all your medications with you for your next visit with your Primary MD  Please request your Primary MD to go over all hospital tests and procedure/radiological results at the follow up, please ask your  Primary MD to get all Hospital records sent to his/her office.  If you experience worsening of your admission symptoms, develop shortness of breath, life threatening emergency, suicidal or homicidal thoughts you must seek medical attention immediately by calling 911 or calling your MD immediately if symptoms less severe.  You must read complete instructions/literature along with all the possible adverse reactions/side effects for all the Medicines you take and that have been prescribed to you. Take any new Medicines after you have completely understood and accpet all the possible adverse reactions/side effects.   Do not drive when taking Pain medications.   Do not take more than prescribed Pain, Sleep and Anxiety  Medications  Special Instructions: If you have smoked or chewed Tobacco in the last 2 yrs please stop smoking, stop any regular Alcohol and or any Recreational drug use.  Wear Seat belts while driving.  Please note  You were cared for by a hospitalist during your hospital stay. Once you are discharged, your primary care physician will handle any further medical issues. Please note that NO REFILLS for any discharge medications will be authorized once you are discharged, as it is imperative that you return to your primary care physician (or establish a relationship with a primary care physician if you do not have one) for your aftercare needs so that they can reassess your need for medications and monitor your lab values.  Discharge Instructions    Ambulatory referral to Neurology    Complete by:  As directed    Pt will follow up with Dr. Erlinda Hong at [facility] in about 2 months. Thanks.       Allergies  Allergen Reactions  . Amoxapine And Related Nausea And Vomiting  . Amoxicillin     itching  . Ciprocinonide [Fluocinolone]     Pt unsure  . Ciprofloxacin     Pt unsure  . Doxycycline Nausea And Vomiting  . Erythromycin Hives  . Sulfa Antibiotics Other (See Comments)    Unknown-reaction as a child  . Zofran [Ondansetron Hcl] Nausea And Vomiting      Disposition: SNF   Consults: Neurology   Significant Diagnostic Studies:  Ct Head Wo Contrast  Result Date: 02/26/2016 CLINICAL DATA:  Aphasia. EXAM: CT HEAD WITHOUT CONTRAST TECHNIQUE: Contiguous axial images were obtained from the base of the skull through the vertex without intravenous contrast. COMPARISON:  MR brain 07/03/2013 and CT head 07/01/2013. FINDINGS: Brain: Difficult to exclude an age-indeterminate lacunar infarct in the left basal ganglia (series 2, image 14). No additional evidence of an acute infarct, acute hemorrhage, mass lesion, mass effect or hydrocephalus. Atrophy. Periventricular low attenuation. Encephalomalacia in  the left parietal lobe. Vascular: No hyperdense vessel or unexpected calcification. Skull: Normal. Negative for fracture or focal lesion. Sinuses/Orbits: No acute finding. Other: None. IMPRESSION: 1. Difficult to exclude an age-indeterminate lacunar infarct in the left basal ganglia. Otherwise, no acute intracranial abnormality. 2. Atrophy and chronic microvascular white matter ischemic changes. 3. Left parietal lobe encephalomalacia. Electronically Signed   By: Lorin Picket M.D.   On: 02/26/2016 19:11   Ct Angio Neck W Or Wo Contrast  Result Date: 02/27/2016 CLINICAL DATA:  Speech difficulty. Acute left temporoparietal MCA infarct on MRI. EXAM: CT ANGIOGRAPHY NECK TECHNIQUE: Multidetector CT imaging of the neck was performed using the standard protocol during bolus administration of intravenous contrast. Multiplanar CT image reconstructions and MIPs were obtained to evaluate the vascular anatomy. Carotid stenosis measurements (when applicable) are obtained utilizing NASCET criteria, using the distal internal  carotid diameter as the denominator. CONTRAST:  50 mL Isovue 370 COMPARISON:  Soft tissue neck CT 12/28/2009. Head MRI/ MRA 02/26/2016. FINDINGS: Aortic arch: Common origin of the brachiocephalic and left common carotid arteries, a normal variant. Moderate calcified and noncalcified atherosclerotic plaque in the aortic arch. Arch vessel origins are widely patent. Right carotid system: Patent without evidence of stenosis or dissection. Left carotid system: Patent without evidence of dissection. Mild non-stenotic plaque in the mid to distal common carotid artery. Eccentric plaque located along the posteromedial wall of the proximal ICA results in 35% stenosis, with a prominent amount of soft plaque noted. The amount of plaque and luminal narrowing at this level has mildly increased from the 2011 neck CT. Partially visualized left greater than right carotid siphon atherosclerosis, more fully evaluated on  recent head MRA. Vertebral arteries: Vertebral arteries are patent with the right being mildly dominant. There is mild to moderate right and severe left vertebral artery stenosis proximally near their origins. Apparent 1.5 mm outpouching projecting anteriorly from the right V4 segment corresponds to focal calcification on yesterday's head CT as well as the prior neck CT and is felt to reflect atherosclerotic plaque rather than a tiny aneurysm. No aneurysm was apparent in this location on yesterday's MRA. Skeleton: Mild cervical spondylosis. Other neck: No neck mass or lymph node enlargement. Upper chest: Moderate emphysema. IMPRESSION: 1. 35% proximal left ICA stenosis. 2. Mild-to-moderate right and severe left proximal vertebral artery stenoses. Electronically Signed   By: Logan Bores M.D.   On: 02/27/2016 11:52   Mr Brain Wo Contrast  Addendum Date: 02/26/2016   ADDENDUM REPORT: 02/26/2016 23:44 ADDENDUM: Acute findings discussed with and reconfirmed by Dr.JARED GARDNER on 02/26/2016 at 11:40pm. Electronically Signed   By: Elon Alas M.D.   On: 02/26/2016 23:44   Result Date: 02/26/2016 CLINICAL DATA:  Altered speech, aphasic. History of stroke, hypertension, carotid artery aneurysm. EXAM: MRI HEAD WITHOUT CONTRAST MRA HEAD WITHOUT CONTRAST TECHNIQUE: Multiplanar, multiecho pulse sequences of the brain and surrounding structures were obtained without intravenous contrast. Angiographic images of the head were obtained using MRA technique without contrast. COMPARISON:  CT HEAD February 26, 2016 at 1858 hours and, MRI of the head July 03, 2013 and MRA head March 10, 2010 moderate stenosis in luminal irregularity RIGHT M1 segment. FINDINGS: MRI HEAD FINDINGS BRAIN: Patchy reduced diffusion LEFT temporoparietal lobe extending to the periventricular white matter. 11 mm ovoid focus of reduced diffusion LEFT posterior corona radiata. Areas of diffusion abnormality demonstrate low ADC values. Faint  susceptibility artifact LEFT parietal lobe at site of old LEFT parietal lobe infarct with encephalomalacia. Old RIGHT thalamus lacunar infarct. Ex vacuo dilatation subjacent LEFT lateral ventricle, no hydrocephalus. No suspicious parenchymal signal, masses or mass effect. Similar patchy to confluent supratentorial pontine white matter FLAIR T2 hyperintensities. No abnormal extra-axial fluid collections. No extra-axial masses though, contrast enhanced sequences would be more sensitive. VASCULAR: Normal major intracranial vascular flow voids present at skull base. SKULL AND UPPER CERVICAL SPINE: No abnormal sellar expansion. No suspicious calvarial bone marrow signal. Craniocervical junction maintained. SINUSES/ORBITS: The mastoid air-cells and included paranasal sinuses are well-aerated. Status post bilateral ocular lens implants. The included ocular globes and orbital contents are non-suspicious. OTHER: Patient is edentulous. MRA HEAD FINDINGS- mild motion degraded examination. ANTERIOR CIRCULATION: Flow related enhancement of the included cervical, petrous, cavernous and supraclinoid internal carotid arteries. Moderate stenosis LEFT cavernous internal carotid artery and, moderate stenosis LEFT greater than RIGHT supraclinoid internal carotid arteries. 2 mm LEFT  cavernous internal carotid artery aneurysm was present previously and better seen on prior less motion degraded examination. Moderate luminal irregularity of the anterior cerebral arteries. Patent anterior communicating artery. Moderate stenosis proximal LEFT A2 segment. Moderate stenosis distal LEFT M1 segment. LEFT M2 segment occlusion. Moderate tandem stenosis RIGHT M1 segment. POSTERIOR CIRCULATION: RIGHT vertebral artery is dominant. Basilar artery is patent, with normal flow related enhancement of the main branch vessels. Normal flow related enhancement of the posterior cerebral arteries. Moderate luminal irregularity of the posterior cerebral  arteries. No aneurysm. IMPRESSION: MRI HEAD: Acute patchy small LEFT temporal parietal/MCA territory nonhemorrhagic infarct. 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 Mild to moderate chronic small vessel ischemic disease. MRA HEAD: LEFT M2 occlusion may be acute. Moderate intracranial atherosclerosis. 2 mm LEFT cavernous/meningohypophyseal aneurysm better characterized on prior less motion degraded examination. Electronically Signed: By: Elon Alas M.D. On: 02/26/2016 23:32   Dg Chest Port 1 View  Result Date: 02/26/2016 CLINICAL DATA:  Chest pain today. EXAM: PORTABLE CHEST 1 VIEW COMPARISON:  Or FINDINGS: The heart size and mediastinal contours are within normal limits. Aortic atherosclerosis. Both lungs are clear. No evidence of pneumothorax or pleural effusion. IMPRESSION: No active disease. Electronically Signed   By: Earle Gell M.D.   On: 02/26/2016 21:19   Mr Jodene Nam Head/brain [id number] Cm  Addendum Date: 02/26/2016   ADDENDUM REPORT: 02/26/2016 23:44 ADDENDUM: Acute findings discussed with and reconfirmed by Dr.JARED GARDNER on 02/26/2016 at 11:40pm. Electronically Signed   By: Elon Alas M.D.   On: 02/26/2016 23:44   Result Date: 02/26/2016 CLINICAL DATA:  Altered speech, aphasic. History of stroke, hypertension, carotid artery aneurysm. EXAM: MRI HEAD WITHOUT CONTRAST MRA HEAD WITHOUT CONTRAST TECHNIQUE: Multiplanar, multiecho pulse sequences of the brain and surrounding structures were obtained without intravenous contrast. Angiographic images of the head were obtained using MRA technique without contrast. COMPARISON:  CT HEAD February 26, 2016 at 1858 hours and, MRI of the head July 03, 2013 and MRA head March 10, 2010 moderate stenosis in luminal irregularity RIGHT M1 segment. FINDINGS: MRI HEAD FINDINGS BRAIN: Patchy reduced diffusion LEFT temporoparietal lobe extending to the periventricular white matter. 11 mm ovoid focus of reduced diffusion LEFT  posterior corona radiata. Areas of diffusion abnormality demonstrate low ADC values. Faint susceptibility artifact LEFT parietal lobe at site of old LEFT parietal lobe infarct with encephalomalacia. Old RIGHT thalamus lacunar infarct. Ex vacuo dilatation subjacent LEFT lateral ventricle, no hydrocephalus. No suspicious parenchymal signal, masses or mass effect. Similar patchy to confluent supratentorial pontine white matter FLAIR T2 hyperintensities. No abnormal extra-axial fluid collections. No extra-axial masses though, contrast enhanced sequences would be more sensitive. VASCULAR: Normal major intracranial vascular flow voids present at skull base. SKULL AND UPPER CERVICAL SPINE: No abnormal sellar expansion. No suspicious calvarial bone marrow signal. Craniocervical junction maintained. SINUSES/ORBITS: The mastoid air-cells and included paranasal sinuses are well-aerated. Status post bilateral ocular lens implants. The included ocular globes and orbital contents are non-suspicious. OTHER: Patient is edentulous. MRA HEAD FINDINGS- mild motion degraded examination. ANTERIOR CIRCULATION: Flow related enhancement of the included cervical, petrous, cavernous and supraclinoid internal carotid arteries. Moderate stenosis LEFT cavernous internal carotid artery and, moderate stenosis LEFT greater than RIGHT supraclinoid internal carotid arteries. 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 Moderate luminal irregularity of the anterior cerebral arteries. Patent anterior communicating artery. Moderate stenosis proximal LEFT A2 segment. Moderate stenosis distal LEFT M1 segment. LEFT  M2 segment occlusion. Moderate tandem stenosis RIGHT M1 segment. POSTERIOR CIRCULATION: RIGHT vertebral artery is dominant. Basilar artery is patent, with normal flow related enhancement of the main branch vessels. Normal flow related enhancement of the  posterior cerebral arteries. Moderate luminal irregularity of the posterior cerebral arteries. No aneurysm. IMPRESSION: MRI HEAD: Acute patchy small LEFT temporal parietal/MCA territory nonhemorrhagic infarct. 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 Mild to moderate chronic small vessel ischemic disease. MRA HEAD: LEFT M2 occlusion may be acute. Moderate intracranial atherosclerosis. 2 mm LEFT cavernous/meningohypophyseal aneurysm better characterized on prior less motion degraded examination. Electronically Signed: By: Elon Alas M.D. On: 02/26/2016 23:32    2-D echo LV EF: 55% -   60%  ------------------------------------------------------------------- Indications:      CVA 436.  ------------------------------------------------------------------- History:   PMH:   Chronic obstructive pulmonary disease.  Risk factors:  Current tobacco use. Hypertension. Dyslipidemia.  ------------------------------------------------------------------- Study Conclusions  - Left ventricle: The cavity size was normal. Wall thickness was   normal. Systolic function was normal. The estimated ejection   fraction was in the range of 55% to 60%. Wall motion was normal;   there were no regional wall motion abnormalities. Doppler   parameters are consistent with abnormal left ventricular   relaxation (grade 1 diastolic dysfunction). - Aortic valve: There was trivial regurgitation. - Pericardium, extracardiac: A small pericardial effusion was   identified. There was no evidence of hemodynamic compromise.   Filed Weights   02/26/16 1816 02/26/16 2330  Weight: 47.2 kg (104 lb) 49.1 kg (108 lb 3.2 oz)      Lipid Panel     Component Value Date/Time   CHOL 126 02/27/2016 0628   TRIG 80 02/27/2016 0628   HDL 54 02/27/2016 0628   CHOLHDL 2.3 02/27/2016 0628   VLDL 16 02/27/2016 0628   LDLCALC 56 02/27/2016 0628     Lab Results  Component Value Date   HGBA1C 5.2  02/27/2016   HGBA1C 5.0 01/25/2016   HGBA1C 5.6 10/25/2015       HPI :*  70 y.o.femalewith a history of previous stroke,, degenerative joint disease, COPD and carotid aneurysm, presenting with new onset speech abnormality. She was last known well on 02/25/16. She was noted to have nonsensical speech output by her daughter this afternoon, and was thought to probably be experiencing a recurrent stroke. CT scan of the head showed no acute intracranial abnormality. Old left parietal area of encephalomalacia was noted. MRI, in addition to old stroke, showed new areas of patchy acute left temporal parietal ischemic infarction. NIH stroke score was 4.  HOSPITAL COURSE:   Stroke:  Patchy left temporal parietal infarct near previous encephalomalacia, more likely due to large vessel disease from left ICA soft plaque. cardioembolic can not be ruled out.  1. HgbA1c 5.2, fasting lipid panel -LDL 56 2. PT consult, OT consult,-recommended SNF ,Speech consult-dysphagia 2 diet and thin liquids 3. Echocardiogram LV EF: 55% - 60% 4. CTA neck 35% proximal L ICA, however, significant soft plaque at origin 5. Prophylactic therapy-Antiplatelet med: Aspirin , Recommend DAPT for 3 months and then plavix alone.  6. Risk factor modification 7. Telemetry showed normal sinus rhythm, Recommend 30 day cardiac event monitoring as outpt to rule out afib, requested  8. Disposition-SNF   Prior CVA  Remote left parietal infarcts prior to 2004  2004 MRI punctate infarcts at the margin of old left parietal infarcts - MRA left M2 occlusion  2011 MRI negative and MRA normal with  left M2 recannulized.  06/2013 MRI negative   02/2016 (this time) - left MCA infarct near the old infarcts, MRA showed left M2 occlusion     COPD -resume home medications, including Singulair, currently stable  Depression-resume lorazepam, resume Remeron, Seroquel,  Gastroesophageal reflux disease-switched to Zantac to  Protonix  Hypertension -continue Cozaar      Discharge Exam:   Blood pressure (!) 174/76, pulse 76, temperature 98.6 F (37 C), temperature source Oral, resp. rate 20, weight 49.1 kg (108 lb 3.2 oz), SpO2 99 %. General exam: Appears calm and comfortable  Respiratory system: Clear to auscultation. Respiratory effort normal. Cardiovascular system: S1 & S2 heard, RRR. No JVD, murmurs, rubs, gallops or clicks. No pedal edema. Gastrointestinal system: Abdomen is nondistended, soft and nontender. No organomegaly or masses felt. Normal bowel sounds heard. Central nervous systeexpressive aphasiam:   Extremities: Symmetric 5 x 5 power. Skin: No rashes, lesions or ulcers Psychiatry: Judgement and insight appear normal. Mood & affect appropriate.      Follow-up Information    Xu,Jindong, MD. Schedule an appointment as soon as possible for a visit in 6 week(s).   Specialty:  Neurology Contact information: [address] [place] [address] [phone]        Alesia Richards, MD. Schedule an appointment as soon as possible for a visit.   Specialty:  Internal Medicine Why:  hospital follow-up, follow CBC, BMP Contact information: [address] [place] [address] [phone]           Signed: Reyne Dumas 02/29/2016, 8:19 AM        Time spent >45 mins

## 2016-02-29 NOTE — Progress Notes (Signed)
Speech Language Pathology Treatment: Cognitive-Linquistic;Dysphagia  Patient Details Name: [name] MRN: [id number] DOB: 24-Oct-1945 Today's Date: 02/29/2016 Time: [id number] SLP Time Calculation (min) (ACUTE ONLY): 16 min  Assessment / Plan / Recommendation Clinical Impression  SLP facilitated session for dysphagia and language providing max phrase completion and phonemic verbal and visual cues. SLP paired verbal language with written to increase comprehension and pt able to read #'s 1-10. Written language not successful pairing words and objects. Yes/no responses to basic information less than 20% accurate.  She is not ready to advance to higher texture due to prolonged mastication and transit with Dys 3 texture. Continue Dys 2, thin and intervention for language and dysphagia.     HPI HPI: 70 y.o.femalewith a history of previous stroke,, degenerative joint disease, COPD and carotid aneurysm, presenting with new onset speech abnormality. She was last known well on 02/25/16. She was noted to have nonsensical speech output by her daughter this afternoon, and was thought to probably be experiencing a recurrent stroke. CT scan of the head showed no acute intracranial abnormality. Old left parietal area of encephalomalacia was noted. MRI, in addition to old stroke, showed new areas of patchy acute left temporal parietal ischemic infarction. NIH stroke score was 4.       SLP Plan  Continue with current plan of care     Recommendations  Diet recommendations: Dysphagia 2 (fine chop);Thin liquid Liquids provided via: Cup;Straw Medication Administration: Whole meds with puree Supervision: Patient able to self feed Compensations: Slow rate;Small sips/bites Postural Changes and/or Swallow Maneuvers: Seated upright 90 degrees                Oral Care Recommendations: Oral care BID Follow up Recommendations: 24 hour supervision/assistance;Inpatient Rehab Plan: Continue with current plan of  care       GO                Houston Siren 02/29/2016, 10:29 AM   Orbie Pyo Colvin Caroli.Ed [employer] [phone]

## 2016-02-29 NOTE — Care Management Note (Signed)
Case Management Note  Patient Details  Name: [name] MRN: [id number] Date of Birth: 05-24-1945  Subjective/Objective:                    Action/Plan: Pt is discharging to [facility] today. No further needs per CM.   Expected Discharge Date:                  Expected Discharge Plan:  Skilled Nursing Facility  In-House Referral:  Clinical Social Work  Discharge planning Services  CM Consult  Post Acute Care Choice:    Choice offered to:     DME Arranged:    DME Agency:     HH Arranged:    [place] Agency:     Status of Service:  Completed, signed off  If discussed at [employer], dates discussed:    Additional Comments:  Pollie Friar, RN 02/29/2016, 4:39 PM

## 2016-02-29 NOTE — Progress Notes (Signed)
Physical Therapy Treatment Patient Details Name: [name] MRN: [id number] DOB: June 02, 1945 Today's Date: 02/29/2016    History of Present Illness Pt with acute encephalopathy and impaired speech. MRI + L posterior MCA territory infarct. PMH: CVA, DJD, COPD, carotid aneurysm, depression.    PT Comments    Patient following gestural cues better today, however at times did require tactile cues. Balance slightly worse than previous session--constantly seeking UE support. ? Due to limited ambulation over past 48 hrs?  Follow Up Recommendations  SNF;Supervision/Assistance - 24 hour (due to communication deficits)     Equipment Recommendations  Other (comment) (tBA)    Recommendations for Other Services       Precautions / Restrictions Precautions Precautions: Fall    Mobility  Bed Mobility Overal bed mobility: Needs Assistance Bed Mobility: Supine to Sit     Supine to sit: Supervision     General bed mobility comments: gestural cues; HOB flat, no rail  Transfers Overall transfer level: Needs assistance Equipment used: 1 person hand held assist Transfers: Sit to/from Stand Sit to Stand: Min guard         General transfer comment: x 3; pt seeking UE support each time (even when tried to have her attempt without HHA  Ambulation/Gait Ambulation/Gait assistance: Min assist Ambulation Distance (Feet): 100 Feet (x 2) Assistive device: 1 person hand held assist Gait Pattern/deviations: Step-through pattern;Decreased stride length;Drifts right/left;Staggering left   Gait velocity interpretation: <1.8 ft/sec, indicative of risk for recurrent falls General Gait Details: Patient with drift to lt/rt with head turns, less than previously (pt voluntarily turning her head to look into other rooms)   Stairs Stairs: Yes Stairs assistance: Min assist Stair Management: Two rails;Forwards;Step to pattern Number of Stairs: 4 (2x2) General stair comments: pt unable to understand  to attempt with one rail; strong grip on bil rails and very cautious with ?fear of falling  Wheelchair Mobility    Modified Rankin (Stroke Patients Only) Modified Rankin (Stroke Patients Only) Pre-Morbid Rankin Score: No significant disability Modified Rankin: Moderately severe disability     Balance Overall balance assessment: Needs assistance Sitting-balance support: No upper extremity supported;Feet unsupported Sitting balance-Leahy Scale: Good     Standing balance support: Single extremity supported Standing balance-Leahy Scale: Poor Standing balance comment: pt would not release PT's hand for support (despite multiple attempts)             High level balance activites: Direction changes;Turns;Sudden stops;Head turns High Level Balance Comments: unsteady with head turns (LOB x 1)    Cognition Arousal/Alertness: Awake/alert Behavior During Therapy: WFL for tasks assessed/performed (smiling; saying hello in halls) Overall Cognitive Status: Difficult to assess                      Exercises      General Comments General comments (skin integrity, edema, etc.): Attempted step taps (as with Berg), however pt unable to imitate and climbed step. Repeated single step ups with tactile cues to alternate legs with ascend and descend (backwards); bil UE support      Pertinent Vitals/Pain Pain Assessment: Faces Faces Pain Scale: No hurt    Home Living                      Prior Function            PT Goals (current goals can now be found in the care plan section) Acute Rehab PT Goals Patient Stated Goal: unable to state,  daughter wants rehab Time For Goal Achievement: 03/12/16 Progress towards PT goals: Progressing toward goals (slowly; pt more cautious)    Frequency    Min 3X/week      PT Plan Current plan remains appropriate    Co-evaluation             End of Session Equipment Utilized During Treatment: Gait belt Activity  Tolerance: Patient tolerated treatment well Patient left: in chair;with call bell/phone within reach;with chair alarm set     Time: 1118-1140 PT Time Calculation (min) (ACUTE ONLY): 22 min  Charges:  $Gait Training: 8-22 mins                    G CodesJeanie Cooks Gardy Montanari March 19, 2016, 11:59 AM Pager [phone]

## 2016-03-01 DIAGNOSIS — I720 Aneurysm of carotid artery: Secondary | ICD-10-CM | POA: Diagnosis not present

## 2016-03-01 DIAGNOSIS — I639 Cerebral infarction, unspecified: Secondary | ICD-10-CM | POA: Diagnosis not present

## 2016-03-01 DIAGNOSIS — J449 Chronic obstructive pulmonary disease, unspecified: Secondary | ICD-10-CM | POA: Diagnosis not present

## 2016-03-01 DIAGNOSIS — E785 Hyperlipidemia, unspecified: Secondary | ICD-10-CM | POA: Diagnosis not present

## 2016-03-01 NOTE — Clinical Social Work Note (Signed)
03/01/2016: CSW spoke with [facility], auth was obtained last night.  [facility] updated.   Darden Dates, MSW, LCSW

## 2016-03-01 NOTE — Clinical Social Work Note (Signed)
Pt ready for discharge to [facility] under LOG as no [employer] Josem Kaufmann has been confirmed at time of discharge. Pt's daughter is aware and agreeable to discharge plan. CSW will provide requested information to daughter regarding medicaid and ALF. CSW will follow with [facility] regarding auth. RN called report. [facility] provided transportation. CSW is signing off as no further needs identified.   Darden Dates, MSW, LCSW Clinical Social Worker  [phone]

## 2016-03-02 ENCOUNTER — Other Ambulatory Visit: Payer: Self-pay | Admitting: Internal Medicine

## 2016-03-02 DIAGNOSIS — J449 Chronic obstructive pulmonary disease, unspecified: Secondary | ICD-10-CM | POA: Diagnosis not present

## 2016-03-02 DIAGNOSIS — F0390 Unspecified dementia without behavioral disturbance: Secondary | ICD-10-CM | POA: Diagnosis not present

## 2016-03-02 DIAGNOSIS — I739 Peripheral vascular disease, unspecified: Secondary | ICD-10-CM | POA: Diagnosis not present

## 2016-03-02 DIAGNOSIS — R1310 Dysphagia, unspecified: Secondary | ICD-10-CM | POA: Diagnosis not present

## 2016-03-02 DIAGNOSIS — I69320 Aphasia following cerebral infarction: Secondary | ICD-10-CM | POA: Diagnosis not present

## 2016-03-02 DIAGNOSIS — F322 Major depressive disorder, single episode, severe without psychotic features: Secondary | ICD-10-CM | POA: Diagnosis not present

## 2016-03-02 DIAGNOSIS — I10 Essential (primary) hypertension: Secondary | ICD-10-CM | POA: Diagnosis not present

## 2016-03-02 DIAGNOSIS — K219 Gastro-esophageal reflux disease without esophagitis: Secondary | ICD-10-CM | POA: Diagnosis not present

## 2016-03-13 DIAGNOSIS — G9340 Encephalopathy, unspecified: Secondary | ICD-10-CM | POA: Diagnosis not present

## 2016-03-13 DIAGNOSIS — I639 Cerebral infarction, unspecified: Secondary | ICD-10-CM | POA: Diagnosis not present

## 2016-03-13 DIAGNOSIS — K219 Gastro-esophageal reflux disease without esophagitis: Secondary | ICD-10-CM | POA: Diagnosis not present

## 2016-03-13 DIAGNOSIS — J449 Chronic obstructive pulmonary disease, unspecified: Secondary | ICD-10-CM | POA: Diagnosis not present

## 2016-03-21 DIAGNOSIS — E785 Hyperlipidemia, unspecified: Secondary | ICD-10-CM | POA: Diagnosis not present

## 2016-03-21 DIAGNOSIS — I10 Essential (primary) hypertension: Secondary | ICD-10-CM | POA: Diagnosis not present

## 2016-04-09 ENCOUNTER — Other Ambulatory Visit: Payer: Self-pay | Admitting: Internal Medicine

## 2016-04-12 DIAGNOSIS — F0390 Unspecified dementia without behavioral disturbance: Secondary | ICD-10-CM | POA: Diagnosis not present

## 2016-04-12 DIAGNOSIS — I10 Essential (primary) hypertension: Secondary | ICD-10-CM | POA: Diagnosis not present

## 2016-04-12 DIAGNOSIS — F411 Generalized anxiety disorder: Secondary | ICD-10-CM | POA: Diagnosis not present

## 2016-04-12 DIAGNOSIS — I69320 Aphasia following cerebral infarction: Secondary | ICD-10-CM | POA: Diagnosis not present

## 2016-04-12 DIAGNOSIS — M1990 Unspecified osteoarthritis, unspecified site: Secondary | ICD-10-CM | POA: Diagnosis not present

## 2016-04-12 DIAGNOSIS — F322 Major depressive disorder, single episode, severe without psychotic features: Secondary | ICD-10-CM | POA: Diagnosis not present

## 2016-04-12 DIAGNOSIS — J449 Chronic obstructive pulmonary disease, unspecified: Secondary | ICD-10-CM | POA: Diagnosis not present

## 2016-04-23 ENCOUNTER — Other Ambulatory Visit: Payer: Self-pay | Admitting: Internal Medicine

## 2016-04-25 ENCOUNTER — Encounter (HOSPITAL_COMMUNITY): Payer: Self-pay | Admitting: Emergency Medicine

## 2016-04-25 ENCOUNTER — Emergency Department (HOSPITAL_COMMUNITY)
Admission: EM | Admit: 2016-04-25 | Discharge: 2016-04-26 | Disposition: A | Discharge status: Home / Self Care | Payer: Medicare HMO | Attending: Emergency Medicine | Admitting: Emergency Medicine

## 2016-04-25 DIAGNOSIS — Z7982 Long term (current) use of aspirin: Secondary | ICD-10-CM | POA: Insufficient documentation

## 2016-04-25 DIAGNOSIS — W01198A Fall on same level from slipping, tripping and stumbling with subsequent striking against other object, initial encounter: Secondary | ICD-10-CM | POA: Insufficient documentation

## 2016-04-25 DIAGNOSIS — R4182 Altered mental status, unspecified: Secondary | ICD-10-CM | POA: Insufficient documentation

## 2016-04-25 DIAGNOSIS — S42202A Unspecified fracture of upper end of left humerus, initial encounter for closed fracture: Secondary | ICD-10-CM | POA: Diagnosis not present

## 2016-04-25 DIAGNOSIS — E871 Hypo-osmolality and hyponatremia: Secondary | ICD-10-CM

## 2016-04-25 DIAGNOSIS — Y999 Unspecified external cause status: Secondary | ICD-10-CM | POA: Insufficient documentation

## 2016-04-25 DIAGNOSIS — S52502A Unspecified fracture of the lower end of left radius, initial encounter for closed fracture: Secondary | ICD-10-CM | POA: Diagnosis not present

## 2016-04-25 DIAGNOSIS — M79622 Pain in left upper arm: Secondary | ICD-10-CM | POA: Diagnosis not present

## 2016-04-25 DIAGNOSIS — F17210 Nicotine dependence, cigarettes, uncomplicated: Secondary | ICD-10-CM | POA: Insufficient documentation

## 2016-04-25 DIAGNOSIS — S199XXA Unspecified injury of neck, initial encounter: Secondary | ICD-10-CM | POA: Diagnosis not present

## 2016-04-25 DIAGNOSIS — M25512 Pain in left shoulder: Secondary | ICD-10-CM | POA: Diagnosis not present

## 2016-04-25 DIAGNOSIS — R52 Pain, unspecified: Secondary | ICD-10-CM

## 2016-04-25 DIAGNOSIS — I10 Essential (primary) hypertension: Secondary | ICD-10-CM | POA: Diagnosis not present

## 2016-04-25 DIAGNOSIS — S52572A Other intraarticular fracture of lower end of left radius, initial encounter for closed fracture: Secondary | ICD-10-CM | POA: Diagnosis not present

## 2016-04-25 DIAGNOSIS — S4991XA Unspecified injury of right shoulder and upper arm, initial encounter: Secondary | ICD-10-CM | POA: Diagnosis present

## 2016-04-25 DIAGNOSIS — Y92129 Unspecified place in nursing home as the place of occurrence of the external cause: Secondary | ICD-10-CM | POA: Diagnosis not present

## 2016-04-25 DIAGNOSIS — Y939 Activity, unspecified: Secondary | ICD-10-CM | POA: Insufficient documentation

## 2016-04-25 DIAGNOSIS — S0083XA Contusion of other part of head, initial encounter: Secondary | ICD-10-CM | POA: Diagnosis not present

## 2016-04-25 DIAGNOSIS — J449 Chronic obstructive pulmonary disease, unspecified: Secondary | ICD-10-CM | POA: Diagnosis not present

## 2016-04-25 DIAGNOSIS — S0990XA Unspecified injury of head, initial encounter: Secondary | ICD-10-CM | POA: Diagnosis not present

## 2016-04-25 DIAGNOSIS — T148XXA Other injury of unspecified body region, initial encounter: Secondary | ICD-10-CM | POA: Diagnosis not present

## 2016-04-25 DIAGNOSIS — Z8673 Personal history of transient ischemic attack (TIA), and cerebral infarction without residual deficits: Secondary | ICD-10-CM | POA: Insufficient documentation

## 2016-04-25 DIAGNOSIS — S42295A Other nondisplaced fracture of upper end of left humerus, initial encounter for closed fracture: Secondary | ICD-10-CM | POA: Diagnosis not present

## 2016-04-25 DIAGNOSIS — M79632 Pain in left forearm: Secondary | ICD-10-CM | POA: Diagnosis not present

## 2016-04-25 DIAGNOSIS — R079 Chest pain, unspecified: Secondary | ICD-10-CM | POA: Diagnosis not present

## 2016-04-25 DIAGNOSIS — S42292A Other displaced fracture of upper end of left humerus, initial encounter for closed fracture: Secondary | ICD-10-CM | POA: Diagnosis not present

## 2016-04-25 MED ORDER — FENTANYL CITRATE (PF) 100 MCG/2ML IJ SOLN
50.0000 ug | Freq: Once | INTRAMUSCULAR | Status: AC
  Administered 2016-04-26: 50 ug via INTRAVENOUS
  Filled 2016-04-25: qty 2

## 2016-04-25 NOTE — ED Provider Notes (Signed)
By signing my name below, I, Charolotte Eke, attest that this documentation has been prepared under the direction and in the presence of [place], DO. Electronically Signed: Charolotte Eke, Scribe. 04/25/16. 11:53 PM.  TIME SEEN: 11:45 PM  CHIEF COMPLAINT: Fall   HPI:  Pt is a  71 y.o. with h/o stroke and aphasia on Plavix, COPD, hypertension presents to ED with left arm pain s/p fall that occurred this afternoon at her hair dressers. States that she slipped on water on the floor. No preceding symptoms that led to her fall. She did hit her head. Pt has associated ecchymosis of the chin. Pt denies LOC. No N/V/D. Denies fever. Denies chest pain or shortness of breath. Denies recent cough. Pt is right hand dominant. Pt denies neck or back pain, any right arm pain, or hip pain.  ROS: See HPI, limited as patient is a poor historian Constitutional: no fever  Eyes: no drainage  ENT: no runny nose   Cardiovascular:  no chest pain  Resp: no SOB  GI: no vomiting GU: no dysuria Integumentary: no rash  Allergy: no hives  Musculoskeletal: no leg swelling  Neurological: no slurred speech ROS otherwise negative  PAST MEDICAL HISTORY/PAST SURGICAL HISTORY:  Past Medical History:  Diagnosis Date  . Carotid artery aneurysm ([place]) 03/04/2012  . COPD (chronic obstructive pulmonary disease) ([place])   . Depression   . DJD (degenerative joint disease)   . GERD (gastroesophageal reflux disease)   . Hypertension   . Stroke [facility])    was on Plavix - not now    MEDICATIONS:  Prior to Admission medications   Medication Sig Start Date End Date Taking? Authorizing Provider  aspirin 325 MG tablet Take 1 tablet (325 mg total) by mouth daily. 02/29/16   Reyne Dumas, MD  atorvastatin (LIPITOR) 20 MG tablet Take 1 tablet (20 mg total) by mouth daily at 6 PM. 02/29/16   Reyne Dumas, MD  b complex vitamins tablet Take 1 tablet by mouth daily. 01/25/16   Courtney Forcucci, PA-C  cholecalciferol (VITAMIN D) 1000 UNITS  tablet Take 5,000 Units by mouth daily.    Historical Provider, MD  clopidogrel (PLAVIX) 75 MG tablet Take 1 tablet (75 mg total) by mouth daily. 02/29/16   Reyne Dumas, MD  gabapentin (NEURONTIN) 600 MG tablet TAKE 1/2 TO 1 TABLET BY MOUTH THREE TIMES DAILY AS DIRECTED 04/09/16   Unk Pinto, MD  LORazepam (ATIVAN) 0.5 MG tablet Take 1 tablet (0.5 mg total) by mouth 2 (two) times daily as needed for anxiety. 02/29/16   Reyne Dumas, MD  losartan (COZAAR) 100 MG tablet TAKE 1 TABLET BY MOUTH EVERY DAY FOR BLOOD PRESSURE 04/23/16   Unk Pinto, MD  Magnesium 500 MG TABS Take 500 mg tablet twice daily with meals. Patient not taking: Reported on 02/27/2016 01/25/16   Loma Sousa Forcucci, PA-C  mirtazapine (REMERON) 45 MG tablet TAKE 1 TABLET BY MOUTH EVERY NIGHT AT BEDTIME Patient taking differently: TAKE 45 MG BY MOUTH EVERY NIGHT AT BEDTIME 02/10/16   Vicie Mutters, PA-C  montelukast (SINGULAIR) 10 MG tablet TAKE 1 TABLET BY MOUTH EVERY DAY Patient taking differently: TAKE 10 MG BY MOUTH EVERY DAY 02/10/16   Vicie Mutters, PA-C  oxybutynin (DITROPAN) 5 MG tablet TAKE 1/2 TO 1 TABLET BY MOUTH THREE TIMES DAILY FOR BLADDER CONTROL 04/23/16   Unk Pinto, MD  pantoprazole (PROTONIX) 40 MG tablet Take 1 tablet (40 mg total) by mouth daily. 02/29/16   Reyne Dumas, MD  QUEtiapine (  SEROQUEL) 400 MG tablet TAKE 1 TABLET BY MOUTH EVERY NIGHT AT BEDTIME 04/09/16   Unk Pinto, MD  ranitidine (ZANTAC) 300 MG tablet TAKE 1 TABLET(300 MG) BY MOUTH TWICE DAILY 04/23/16   Unk Pinto, MD  traMADol (ULTRAM) 50 MG tablet Take 1 tablet (50 mg total) by mouth 2 (two) times daily as needed. 02/29/16   Reyne Dumas, MD  vitamin C (ASCORBIC ACID) 500 MG tablet Take 500 mg by mouth daily.    Historical Provider, MD    ALLERGIES:  Allergies  Allergen Reactions  . Amoxapine And Related Nausea And Vomiting  . Amoxicillin     itching  . Ciprocinonide [Fluocinolone]     Pt unsure  . Ciprofloxacin     Pt  unsure  . Doxycycline Nausea And Vomiting  . Erythromycin Hives  . Sulfa Antibiotics Other (See Comments)    Unknown-reaction as a child  . Zofran [Ondansetron Hcl] Nausea And Vomiting    SOCIAL HISTORY:  Social History  Substance Use Topics  . Smoking status: Current Every Day Smoker    Packs/day: 0.25    Years: 30.00    Types: Cigarettes  . Smokeless tobacco: Never Used  . Alcohol use No    FAMILY HISTORY: Family History  Problem Relation Age of Onset  . Breast cancer Mother   . Breast cancer Sister     EXAM: BP (!) 204/106 (BP Location: Right Arm)   Pulse 96   Temp 98 F (36.7 C) (Oral)   Resp 20   SpO2 95%  CONSTITUTIONAL: Elderly, Alert Chronically ill appearing; well-nourished; GCS 15 HEAD: Normocephalic; Ecchymosis to her chin but no tenderness over the bones of her face EYES: Conjunctivae clear, PERRL, EOMI ENT: normal nose; no rhinorrhea; moist mucous membranes; pharynx without lesions noted; no dental injury; no septal hematoma NECK: Supple, no meningismus, no LAD; no midline spinal tenderness, step-off or deformity; trachea midline CARD: RRR; S1 and S2 appreciated; no murmurs, no clicks, no rubs, no gallops RESP: Normal chest excursion without splinting or tachypnea; breath sounds clear and equal bilaterally; no wheezes, no rhonchi, no rales; no hypoxia or respiratory distress CHEST:  chest wall stable, no crepitus or ecchymosis or deformity, nontender to palpation; no flail chest ABD/GI: Normal bowel sounds; non-distended; soft, non-tender, no rebound, no guarding; no ecchymosis or other lesions noted PELVIS:  stable, nontender to palpation BACK:  The back appears normal and is non-tender to palpation, there is no CVA tenderness; no midline spinal tenderness, step-off or deformity EXT: TTP proximal left humerus and left wrist. Left wrist is swollen with ecchymosis, but no obvious deformity. No tenderness over left elbow. No loss of fullness to left elbow. 2+  left radial pulse. Unable to assess sensation of left arm due to pt's poor comprehension. Otherwise extremities are nontender to palpation. Compartments are soft. SKIN: Normal color for age and race; warm NEURO: Moves all extremities equally PSYCH: The patient's mood and manner are appropriate. Grooming and personal hygiene are appropriate.  MEDICAL DECISION MAKING: Patient here with what sounds like a mechanical fall. She is extremely hypertensive and appears agitated. We have contacted patient's daughter who would want her to be evaluated. She had x-rays at her nursing home that showed a left nondisplaced humeral neck fracture and a left distal radius fracture. She is on Plavix and it does appear she hit her head. We'll repeat x-rays of the left arm. We'll obtain CT of her head and cervical spine. Will check labs, urine today to ensure  there is no organic cause for her fall given patient's poor comprehension to questioning. I suspect this is from her recent stroke in family conference with nursing staff that this is unchanged. She denies any chest pain or shortness of breath but is very hypertensive here. We'll treat her pain and reassess her blood pressure. Family states that if she needed surgical intervention for her fractures they would want it done.  ED PROGRESS: 1:35 AM  Patient's labs show leukocytosis of 17,000. Urine shows trace leukocytes but no other sign of infection. She is afebrile here but given she is a very poor historian, will check a rectal temperature, chest x-ray and lactate. Sodium shows mild hyponatremia of 125. She appears dry on exam. Will give IV fluids. CT of her head and cervical spine show no acute abnormality. X-rays of the left wrist and left humerus redemonstrate fracture seen previously. We will place her in a sling and a radial gutter splint and anticipate outpatient follow-up with Dr. Caralyn Guile. We will recheck her blood pressure.   2:30 AM  Pt fought during having a  sugar tong placed on her left wrist. We'll repeat x-ray does ensure there is no change in positioning.   4:10 AM  Pt's lactate is normal. Rectal temperature normal. Urine shows trace leukocytes but no other sign of infection and no bacteria. Suspect that her leukocytosis is reactive in nature. Reviewed x-ray of the left wrist actually shows improved alignment. She is in a sugar tong splint and a sling. I do not feel she needs to see orthopedics emergently but will give outpatient follow-up. We'll discharge back to her nursing facility with pain medication.   At this time, I do not feel there is any life-threatening condition present. I have reviewed and discussed all results (EKG, imaging, lab, urine as appropriate) and exam findings with patient/family. I have reviewed nursing notes and appropriate previous records.  I feel the patient is safe to be discharged home without further emergent workup and can continue workup as an outpatient as needed. Discussed usual and customary return precautions. Patient/family verbalize understanding and are comfortable with this plan.  Outpatient follow-up has been provided. All questions have been answered.    EKG Interpretation  Date/Time:  Thursday April 26 2016 01:14:58 EST Ventricular Rate:  90 PR Interval:    QRS Duration: 89 QT Interval:  383 QTC Calculation: 469 R Axis:   75 Text Interpretation:  Sinus rhythm No significant change since last tracing Confirmed by Tomorrow Dehaas,  DO, Avayah Raffety [id number]) on 04/26/2016 1:17:18 AM        SPLINT APPLICATION Date/Time: 99991111 AM Authorized by: Nyra Jabs Consent: Verbal consent obtained. Risks and benefits: risks, benefits and alternatives were discussed Consent given by: patient Splint applied by: orthopedic technician Location details: left wrist Splint type: sugar tong Supplies used: fiberglass Post-procedure: The splinted body part was neurovascularly unchanged following the procedure. Patient  tolerance: Patient tolerated the procedure well with no immediate complications.     I personally performed the services described in this documentation, which was scribed in my presence. The recorded information has been reviewed and is accurate.    [place], DO 04/26/16 [id number]

## 2016-04-25 NOTE — ED Notes (Signed)
Pt ambulated from EMS stretcher to room with standby. Minimal assistance needed. Pt then ambulated from room to restroom and back. Steady gait noted with standby on pt's right side. Minimal assistance needed.

## 2016-04-25 NOTE — ED Triage Notes (Signed)
Per EMS, pt from [place] with c/o fall at 1430 this afternoon. Facility did x-ray, shows nondisplaced humeral neck fracture on the left side. Radiology reports at the bedside. CBG-150, BP-170/100, SpO2-93% ra. Pt received 148mcg fentanyl total PTA.

## 2016-04-26 ENCOUNTER — Emergency Department (HOSPITAL_COMMUNITY): Payer: Medicare HMO

## 2016-04-26 DIAGNOSIS — S199XXA Unspecified injury of neck, initial encounter: Secondary | ICD-10-CM | POA: Diagnosis not present

## 2016-04-26 DIAGNOSIS — S52572A Other intraarticular fracture of lower end of left radius, initial encounter for closed fracture: Secondary | ICD-10-CM | POA: Diagnosis not present

## 2016-04-26 DIAGNOSIS — G894 Chronic pain syndrome: Secondary | ICD-10-CM | POA: Diagnosis not present

## 2016-04-26 DIAGNOSIS — S42202D Unspecified fracture of upper end of left humerus, subsequent encounter for fracture with routine healing: Secondary | ICD-10-CM | POA: Diagnosis not present

## 2016-04-26 DIAGNOSIS — S0990XA Unspecified injury of head, initial encounter: Secondary | ICD-10-CM | POA: Diagnosis not present

## 2016-04-26 DIAGNOSIS — S52502D Unspecified fracture of the lower end of left radius, subsequent encounter for closed fracture with routine healing: Secondary | ICD-10-CM | POA: Diagnosis not present

## 2016-04-26 DIAGNOSIS — R079 Chest pain, unspecified: Secondary | ICD-10-CM | POA: Diagnosis not present

## 2016-04-26 DIAGNOSIS — I639 Cerebral infarction, unspecified: Secondary | ICD-10-CM | POA: Diagnosis not present

## 2016-04-26 DIAGNOSIS — R531 Weakness: Secondary | ICD-10-CM | POA: Diagnosis not present

## 2016-04-26 DIAGNOSIS — S42202A Unspecified fracture of upper end of left humerus, initial encounter for closed fracture: Secondary | ICD-10-CM | POA: Diagnosis not present

## 2016-04-26 DIAGNOSIS — W19XXXD Unspecified fall, subsequent encounter: Secondary | ICD-10-CM | POA: Diagnosis not present

## 2016-04-26 DIAGNOSIS — R4182 Altered mental status, unspecified: Secondary | ICD-10-CM | POA: Diagnosis not present

## 2016-04-26 DIAGNOSIS — S42295A Other nondisplaced fracture of upper end of left humerus, initial encounter for closed fracture: Secondary | ICD-10-CM | POA: Diagnosis not present

## 2016-04-26 LAB — CBC WITH DIFFERENTIAL/PLATELET
BASOS PCT: 0 %
Basophils Absolute: 0 10*3/uL (ref 0.0–0.1)
Eosinophils Absolute: 0 10*3/uL (ref 0.0–0.7)
Eosinophils Relative: 0 %
HEMATOCRIT: 32.6 % — AB (ref 36.0–46.0)
Hemoglobin: 11.4 g/dL — ABNORMAL LOW (ref 12.0–15.0)
LYMPHS ABS: 1.6 10*3/uL (ref 0.7–4.0)
Lymphocytes Relative: 9 %
MCH: 30.8 pg (ref 26.0–34.0)
MCHC: 35 g/dL (ref 30.0–36.0)
MCV: 88.1 fL (ref 78.0–100.0)
MONOS PCT: 14 %
Monocytes Absolute: 2.4 10*3/uL — ABNORMAL HIGH (ref 0.1–1.0)
NEUTROS ABS: 13.3 10*3/uL — AB (ref 1.7–7.7)
Neutrophils Relative %: 77 %
Platelets: 361 10*3/uL (ref 150–400)
RBC: 3.7 MIL/uL — AB (ref 3.87–5.11)
RDW: 12.5 % (ref 11.5–15.5)
WBC: 17.3 10*3/uL — ABNORMAL HIGH (ref 4.0–10.5)

## 2016-04-26 LAB — URINALYSIS, ROUTINE W REFLEX MICROSCOPIC
BILIRUBIN URINE: NEGATIVE
Bacteria, UA: NONE SEEN
GLUCOSE, UA: NEGATIVE mg/dL
HGB URINE DIPSTICK: NEGATIVE
Ketones, ur: NEGATIVE mg/dL
NITRITE: NEGATIVE
PH: 7 (ref 5.0–8.0)
Protein, ur: NEGATIVE mg/dL
SPECIFIC GRAVITY, URINE: 1.005 (ref 1.005–1.030)

## 2016-04-26 LAB — BASIC METABOLIC PANEL
ANION GAP: 8 (ref 5–15)
BUN: 9 mg/dL (ref 6–20)
CALCIUM: 9 mg/dL (ref 8.9–10.3)
CO2: 26 mmol/L (ref 22–32)
Chloride: 91 mmol/L — ABNORMAL LOW (ref 101–111)
Creatinine, Ser: 0.94 mg/dL (ref 0.44–1.00)
GFR calc Af Amer: >60 mL/min (ref >60)
GFR calc non Af Amer: 59 mL/min — ABNORMAL LOW (ref >60)
GLUCOSE: 137 mg/dL — AB (ref 65–99)
Potassium: 4.7 mmol/L (ref 3.5–5.1)
Sodium: 125 mmol/L — ABNORMAL LOW (ref 135–145)

## 2016-04-26 LAB — I-STAT CG4 LACTIC ACID, ED: Lactic Acid, Venous: 1.23 mmol/L (ref 0.5–1.9)

## 2016-04-26 MED ORDER — MORPHINE SULFATE (PF) 4 MG/ML IV SOLN
4.0000 mg | Freq: Once | INTRAVENOUS | Status: AC
Start: 2016-04-26 — End: 2016-04-26
  Administered 2016-04-26: 4 mg via INTRAVENOUS
  Filled 2016-04-26: qty 1

## 2016-04-26 MED ORDER — SODIUM CHLORIDE 0.9 % IV BOLUS (SEPSIS)
1000.0000 mL | Freq: Once | INTRAVENOUS | Status: AC
  Administered 2016-04-26: 1000 mL via INTRAVENOUS

## 2016-04-26 MED ORDER — DOCUSATE SODIUM 100 MG PO CAPS: 100.0000 mg | ORAL_CAPSULE | Freq: Two times a day (BID) | ORAL | 0 refills | Status: DC

## 2016-04-26 MED ORDER — LORAZEPAM 2 MG/ML IJ SOLN
0.5000 mg | Freq: Once | INTRAMUSCULAR | Status: AC
  Administered 2016-04-26: 0.5 mg via INTRAVENOUS
  Filled 2016-04-26: qty 1

## 2016-04-26 MED ORDER — HYDROCODONE-ACETAMINOPHEN 5-325 MG PO TABS: 1.0000 | ORAL_TABLET | Freq: Four times a day (QID) | ORAL | 0 refills | Status: DC | PRN

## 2016-04-26 NOTE — Progress Notes (Signed)
Orthopedic Tech Progress Note Patient Details:  [name] 30-Aug-1945 [id number]  Ortho Devices Type of Ortho Device: Sugartong splint, Sling immobilizer Ortho Device/Splint Location: lue sugartong and sling immobilizer as per drs verbal order. pt was struggling with [place] the whole time. we had 1 rn helping with each arm. alerted dr to this. Ortho Device/Splint Interventions: Ordered, Application   Karolee Stamps 04/26/2016, 2:35 AM

## 2016-04-26 NOTE — Discharge Instructions (Signed)
Please follow-up with Dr. Caralyn Guile for the left radius and left humerus fracture. Head and cervical CT scans were normal. Labs showed mild elevation of the white blood cell count which may be reactive. Urine showed no obvious infection. Chest x-ray should not be as infection. Sodium mildly low at 125. Given a liter of normal saline IV fluids for this. Will need to have this rechecked in one week with her PCP.

## 2016-04-27 DIAGNOSIS — S42255A Nondisplaced fracture of greater tuberosity of left humerus, initial encounter for closed fracture: Secondary | ICD-10-CM | POA: Diagnosis not present

## 2016-04-27 DIAGNOSIS — S52572A Other intraarticular fracture of lower end of left radius, initial encounter for closed fracture: Secondary | ICD-10-CM | POA: Diagnosis not present

## 2016-05-03 ENCOUNTER — Ambulatory Visit: Payer: Self-pay | Admitting: Internal Medicine

## 2016-05-07 DIAGNOSIS — S42255D Nondisplaced fracture of greater tuberosity of left humerus, subsequent encounter for fracture with routine healing: Secondary | ICD-10-CM | POA: Diagnosis not present

## 2016-05-07 DIAGNOSIS — S52572D Other intraarticular fracture of lower end of left radius, subsequent encounter for closed fracture with routine healing: Secondary | ICD-10-CM | POA: Diagnosis not present

## 2016-05-11 DIAGNOSIS — I10 Essential (primary) hypertension: Secondary | ICD-10-CM | POA: Diagnosis not present

## 2016-05-11 DIAGNOSIS — I779 Disorder of arteries and arterioles, unspecified: Secondary | ICD-10-CM | POA: Diagnosis not present

## 2016-05-11 DIAGNOSIS — M1990 Unspecified osteoarthritis, unspecified site: Secondary | ICD-10-CM | POA: Diagnosis not present

## 2016-05-11 DIAGNOSIS — F411 Generalized anxiety disorder: Secondary | ICD-10-CM | POA: Diagnosis not present

## 2016-05-11 DIAGNOSIS — J449 Chronic obstructive pulmonary disease, unspecified: Secondary | ICD-10-CM | POA: Diagnosis not present

## 2016-05-11 DIAGNOSIS — S42302A Unspecified fracture of shaft of humerus, left arm, initial encounter for closed fracture: Secondary | ICD-10-CM | POA: Diagnosis not present

## 2016-05-11 DIAGNOSIS — I69320 Aphasia following cerebral infarction: Secondary | ICD-10-CM | POA: Diagnosis not present

## 2016-05-11 DIAGNOSIS — F322 Major depressive disorder, single episode, severe without psychotic features: Secondary | ICD-10-CM | POA: Diagnosis not present

## 2016-05-11 DIAGNOSIS — L84 Corns and callosities: Secondary | ICD-10-CM | POA: Diagnosis not present

## 2016-05-15 ENCOUNTER — Ambulatory Visit (INDEPENDENT_AMBULATORY_CARE_PROVIDER_SITE_OTHER): Payer: Medicare HMO | Admitting: Neurology

## 2016-05-15 ENCOUNTER — Encounter: Payer: Self-pay | Admitting: Neurology

## 2016-05-15 VITALS — BP 174/98 | HR 98 | Ht 60.0 in | Wt 109.8 lb

## 2016-05-15 DIAGNOSIS — R4701 Aphasia: Secondary | ICD-10-CM

## 2016-05-15 DIAGNOSIS — I10 Essential (primary) hypertension: Secondary | ICD-10-CM | POA: Diagnosis not present

## 2016-05-15 DIAGNOSIS — I6522 Occlusion and stenosis of left carotid artery: Secondary | ICD-10-CM

## 2016-05-15 DIAGNOSIS — I63412 Cerebral infarction due to embolism of left middle cerebral artery: Secondary | ICD-10-CM

## 2016-05-15 NOTE — Patient Instructions (Addendum)
-   continue ASA and plavix for another one month and then plavix alone - continue lipitor for stroke prevention - will recommend to have continued speech therapy for language treatment.  - Follow up with your primary care physician for stroke risk factor modification. Recommend maintain blood pressure goal <130/80, diabetes with hemoglobin A1c goal below 7.0% and lipids with LDL cholesterol goal below 70 mg/dL.  - check BP at SNF daily. So far your BP still high, needs close monitoring and adjust medications - recommend 30 day cardiac event monitoring to rule out afib.  - follow up in 4 months.

## 2016-05-15 NOTE — Progress Notes (Signed)
STROKE NEUROLOGY FOLLOW UP NOTE  NAME: JERMERIA TELLES DOB: 02/04/1946  REASON FOR VISIT: stroke follow up HISTORY FROM: chart  Today we had the pleasure of seeing [name] in follow-up at our Neurology Clinic. Pt was accompanied by nursing staff from SNF.   History Summary [name] is a 71 y.o. female with history of HTN, previous stroke, smoker and COPD admitted on 02/26/16 for nonsensical speech. Exam showed global aphasia, receptive > expressive. MRI sowed patchy left temporoparietal infarct near remote left parietal encephalomalacia and left CR. MRA showed left M2 occlusion, and 65mm left cavernous aneurysm. CTA neck showed left ICA proximal 35% stenosis with significant soft plaque at origin. EF 55-60%. LDL 56 and A1C 5.2. She was put on DAPT and lipitor and discharged to SNF.  Hx of stroke  Remote left parietal infarcts prior to 2004  2004 MRI punctate infarcts at the margin of old left parietal infarcts - MRA left M2 occlusion  2011 MRI negative and MRA normal with left M2 recannulized.  06/2013 MRI negative   02/2016 - left MCA infarct near the old infarcts, MRA showed left M2 occlusion  Interval History During the interval time, the patient has been doing well. Lives in SNF with improved language. Able to follow simple commands with fluent speech output, however, still has paraphasic errors and hesitancy speech. Difficulty with naming and repetition. BP was high and not checking BP in SNF. Has quit smoking since last admission. Had fall about 2 weeks ago with left arm fracture on sling. BP today 174/98. Still on DAPT and lipitor. 30 day cardiac monitoring not done yet.     REVIEW OF SYSTEMS: Full 14 system review of systems performed and notable only for those listed below and in HPI above, all others are negative:  Constitutional:   Cardiovascular:  Ear/Nose/Throat:   Skin:  Eyes:   Respiratory:   Gastroitestinal:   Genitourinary:  Hematology/Lymphatic:     Endocrine: excessive thirst Musculoskeletal:   Allergy/Immunology:   Neurological:   Psychiatric:  Sleep:   The following represents the patient's updated allergies and side effects list: Allergies  Allergen Reactions  . Amoxapine And Related Nausea And Vomiting  . Amoxicillin     itching  . Ciprocinonide [Fluocinolone]     Pt unsure  . Ciprofloxacin     Pt unsure  . Doxycycline Nausea And Vomiting  . Erythromycin Hives  . Sulfa Antibiotics Other (See Comments)    Unknown-reaction as a child  . Zofran [Ondansetron Hcl] Nausea And Vomiting    The neurologically relevant items on the patient's problem list were reviewed on today's visit.  Neurologic Examination  A problem focused neurological exam (12 or more points of the single system neurologic examination, vital signs counts as 1 point, cranial nerves count for 8 points) was performed.  Blood pressure (!) 174/98, pulse 98, height 5' (1.524 m), weight 109 lb 12.8 oz (49.8 kg).  General - Well nourished, well developed, in no apparent distress.  Ophthalmologic - Fundi not visualized due to noncooperation.  Cardiovascular - Regular rate and rhythm with no murmur.  Mental Status -  Level of arousal but not orientated to time, place, and person Impulsive, excessive language output, fluent but frequent paraphasic errors with intermittent hesitancy/wording finding difficulty. Name 1/4, able to repeat part of the sentences.   Cranial Nerves II - XII - II - Visual field intact OU. III, IV, VI - Extraocular movements intact. V - Facial sensation  intact bilaterally. VII - Facial movement intact bilaterally. VIII - Hearing & vestibular intact bilaterally. X - Palate elevates symmetrically. XI - Chin turning & shoulder shrug intact bilaterally. XII - Tongue protrusion intact.  Motor Strength - The patient's strength was normal in all extremities except LUE in shoulder sling.  Bulk was normal and fasciculations were absent.    Motor Tone - Muscle tone was assessed at the neck and appendages and was normal.  Reflexes - The patient's reflexes were 1+ in all extremities and she had no pathological reflexes.  Sensory - Light touch, temperature/pinprick were assessed and were normal.    Coordination - The patient had normal movements in the right hand with no ataxia or dysmetria.  Tremor was absent.  Gait and Station - The patient's transfers, posture, gait, station, and turns were observed as normal.   Functional score  mRS = 2   0 - No symptoms.   1 - No significant disability. Able to carry out all usual activities, despite some symptoms.   2 - Slight disability. Able to look after own affairs without assistance, but unable to carry out all previous activities.   3 - Moderate disability. Requires some help, but able to walk unassisted.   4 - Moderately severe disability. Unable to attend to own bodily needs without assistance, and unable to walk unassisted.   5 - Severe disability. Requires constant nursing care and attention, bedridden, incontinent.   6 - Dead.   NIH Stroke Scale   Level Of Consciousness 0=Alert; keenly responsive 1=Not alert, but arousable by minor stimulation 2=Not alert, requires repeated stimulation 3=Responds only with reflex movements 0  LOC Questions to Month and Age 44=Answers both questions correctly 1=Answers one question correctly 2=Answers neither question correctly 2  LOC Commands      -Open/Close eyes     -Open/close grip 0=Performs both tasks correctly 1=Performs one task correctly 2=Performs neighter task correctly 0  Best Gaze 0=Normal 1=Partial gaze palsy 2=Forced deviation, or total gaze paresis 0  Visual 0=No visual loss 1=Partial hemianopia 2=Complete hemianopia 3=Bilateral hemianopia (blind including cortical blindness) 0  Facial Palsy 0=Normal symmetrical movement 1=Minor paralysis (asymmetry) 2=Partial paralysis (lower face) 3=Complete  paralysis (upper and lower face) 0  Motor  0=No drift, limb holds posture for full 10 seconds 1=Drift, limb holds posture, no drift to bed 2=Some antigravity effort, cannot maintain posture, drifts to bed 3=No effort against gravity, limb falls 4=No movement Right Arm 0     Leg 0    Left Arm 0     Leg 0  Limb Ataxia 0=Absent 1=Present in one limb 2=Present in two limbs 0  Sensory 0=Normal 1=Mild to moderate sensory loss 2=Severe to total sensory loss 0  Best Language 0=No aphasia, normal 1=Mild to moderate aphasia 2=Mute, global aphasia 3=Mute, global aphasia 1  Dysarthria 0=Normal 1=Mild to moderate 2=Severe, unintelligible or mute/anarthric 0  Extinction/Neglect 0=No abnormality 1=Extinction to bilateral simultaneous stimulation 2=Profound neglect 0  Total   3     Data reviewed: I personally reviewed the images and agree with the radiology interpretations.  Ct Head Wo Contrast 02/26/2016 1. Difficult to exclude an age-indeterminate lacunar infarct in the left basal ganglia. Otherwise, no acute intracranial abnormality. 2. Atrophy and chronic microvascular white matter ischemic changes. 3. Left parietal lobe encephalomalacia.   Ct Angio Neck W Or Wo Contrast 02/27/2016 1. 35% proximal left ICA stenosis. 2. Mild-to-moderate right and severe left proximal vertebral artery stenoses.   MRI HEAD 02/26/2016  Acute patchy small LEFT temporal parietal/MCA territory nonhemorrhagic infarct. 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 Mild to moderate chronic small vessel ischemic disease.   MRA HEAD 02/26/2016 LEFT M2 occlusion may be acute. Moderate intracranial atherosclerosis. 2 mm LEFT cavernous/meningohypophyseal aneurysm better characterized on prior less motion degraded examination.   2D Echocardiogram  - Left ventricle: The cavity size was normal. Wall thickness wasnormal. Systolic function was normal. The estimated ejectionfraction was in  the range of 55% to 60%. Wall motion was normal;there were no regional wall motion abnormalities. Dopplerparameters are consistent with abnormal left ventricularrelaxation (grade 1 diastolic dysfunction). - Aortic valve: There was trivial regurgitation. - Pericardium, extracardiac: A small pericardial effusion wasidentified. There was no evidence of hemodynamic compromise.  Component     Latest Ref Rng & Units 01/25/2016 02/27/2016  Cholesterol     0 - 200 mg/dL 122 (L) 126  Triglycerides     <150 mg/dL 153 (H) 80  HDL Cholesterol     >40 mg/dL 49 54  Total CHOL/HDL Ratio     RATIO 2.5 2.3  VLDL     0 - 40 mg/dL 31 (H) 16  LDL (calc)     0 - 99 mg/dL 42 56  Hemoglobin A1C     4.8 - 5.6 % 5.0 5.2  Mean Plasma Glucose     mg/dL 97 103  TSH     mIU/L 1.98      Assessment: As you may recall, she is a 71 y.o. Caucasian female with PMH of HTN, previous stroke in 2004, smoker and COPD admitted on 02/26/16 for global aphasia. MRI sowed patchy left temporoparietal infarct near remote left parietal encephalomalacia and left CR. MRA showed left M2 occlusion, and 64mm left cavernous aneurysm. CTA neck showed left ICA proximal 35% stenosis with significant soft plaque at origin. EF 55-60%. LDL 56 and A1C 5.2. She was put on DAPT and lipitor and discharged to SNF. Her stroke could be due to left ICA soft plaque, low BP in the setting of left M2 occlusion or cardioembolic. Currently lives in SNF with improved language, but still has mild aphasia. BP was high and not checking BP in SNF. Has quit smoking since last admission. 30 day cardiac monitoring not done yet.     Plan:  - continue ASA and plavix for total 3 months and then plavix alone - continue lipitor for stroke prevention - will recommend to have continued speech therapy for language treatment.  - Follow up with your primary care physician for stroke risk factor modification. Recommend maintain blood pressure goal <130/80, diabetes with  hemoglobin A1c goal below 7.0% and lipids with LDL cholesterol goal below 70 mg/dL.  - check BP at SNF daily. So far BP still high, needs close monitoring and adjust medications - recommend 30 day cardiac event monitoring to rule out afib.  - follow up in 4 months with carolyn or magen.   I spent more than 25 minutes of face to face time with the patient. Greater than 50% of time was spent in counseling and coordination of care. We discussed BP control, cardiac monitoring and medication regimen.    Orders Placed This Encounter  Procedures  . Cardiac event monitor    Standing Status:   Future    Standing Expiration Date:   05/16/2017    Scheduling Instructions:     Request cardionet setup. Thank you.    Order Specific Question:   Where should this test be performed?  Answer:   [facility]    No orders of the defined types were placed in this encounter.   Patient Instructions  - continue ASA and plavix for another one month and then plavix alone - continue lipitor for stroke prevention - will recommend to have continued speech therapy for language treatment.  - Follow up with your primary care physician for stroke risk factor modification. Recommend maintain blood pressure goal <130/80, diabetes with hemoglobin A1c goal below 7.0% and lipids with LDL cholesterol goal below 70 mg/dL.  - check BP at SNF daily. So far your BP still high, needs close monitoring and adjust medications - recommend 30 day cardiac event monitoring to rule out afib.  - follow up in 4 months.    Rosalin Hawking, MD PhD [facility] [address], [place], [place] [address] [phone]

## 2016-05-16 DIAGNOSIS — I639 Cerebral infarction, unspecified: Secondary | ICD-10-CM | POA: Diagnosis not present

## 2016-05-16 DIAGNOSIS — S42202D Unspecified fracture of upper end of left humerus, subsequent encounter for fracture with routine healing: Secondary | ICD-10-CM | POA: Diagnosis not present

## 2016-05-16 DIAGNOSIS — J449 Chronic obstructive pulmonary disease, unspecified: Secondary | ICD-10-CM | POA: Diagnosis not present

## 2016-05-16 DIAGNOSIS — I10 Essential (primary) hypertension: Secondary | ICD-10-CM | POA: Diagnosis not present

## 2016-05-17 DIAGNOSIS — M6281 Muscle weakness (generalized): Secondary | ICD-10-CM | POA: Diagnosis not present

## 2016-05-17 DIAGNOSIS — I6522 Occlusion and stenosis of left carotid artery: Secondary | ICD-10-CM | POA: Diagnosis not present

## 2016-05-17 DIAGNOSIS — I10 Essential (primary) hypertension: Secondary | ICD-10-CM | POA: Diagnosis not present

## 2016-05-17 DIAGNOSIS — R1312 Dysphagia, oropharyngeal phase: Secondary | ICD-10-CM | POA: Diagnosis not present

## 2016-05-17 DIAGNOSIS — K219 Gastro-esophageal reflux disease without esophagitis: Secondary | ICD-10-CM | POA: Diagnosis not present

## 2016-05-17 DIAGNOSIS — E785 Hyperlipidemia, unspecified: Secondary | ICD-10-CM | POA: Diagnosis not present

## 2016-05-17 DIAGNOSIS — M810 Age-related osteoporosis without current pathological fracture: Secondary | ICD-10-CM | POA: Diagnosis not present

## 2016-05-17 DIAGNOSIS — J449 Chronic obstructive pulmonary disease, unspecified: Secondary | ICD-10-CM | POA: Diagnosis not present

## 2016-05-17 DIAGNOSIS — I69320 Aphasia following cerebral infarction: Secondary | ICD-10-CM | POA: Diagnosis not present

## 2016-05-18 DIAGNOSIS — I69320 Aphasia following cerebral infarction: Secondary | ICD-10-CM | POA: Diagnosis not present

## 2016-05-18 DIAGNOSIS — M79632 Pain in left forearm: Secondary | ICD-10-CM | POA: Diagnosis not present

## 2016-05-18 DIAGNOSIS — M6281 Muscle weakness (generalized): Secondary | ICD-10-CM | POA: Diagnosis not present

## 2016-05-18 DIAGNOSIS — R1312 Dysphagia, oropharyngeal phase: Secondary | ICD-10-CM | POA: Diagnosis not present

## 2016-05-18 DIAGNOSIS — I10 Essential (primary) hypertension: Secondary | ICD-10-CM | POA: Diagnosis not present

## 2016-05-18 DIAGNOSIS — I6522 Occlusion and stenosis of left carotid artery: Secondary | ICD-10-CM | POA: Diagnosis not present

## 2016-05-18 DIAGNOSIS — J449 Chronic obstructive pulmonary disease, unspecified: Secondary | ICD-10-CM | POA: Diagnosis not present

## 2016-05-18 DIAGNOSIS — E785 Hyperlipidemia, unspecified: Secondary | ICD-10-CM | POA: Diagnosis not present

## 2016-05-18 DIAGNOSIS — K219 Gastro-esophageal reflux disease without esophagitis: Secondary | ICD-10-CM | POA: Diagnosis not present

## 2016-05-18 DIAGNOSIS — M810 Age-related osteoporosis without current pathological fracture: Secondary | ICD-10-CM | POA: Diagnosis not present

## 2016-05-19 ENCOUNTER — Other Ambulatory Visit: Payer: Self-pay | Admitting: Physician Assistant

## 2016-05-21 DIAGNOSIS — J449 Chronic obstructive pulmonary disease, unspecified: Secondary | ICD-10-CM | POA: Diagnosis not present

## 2016-05-21 DIAGNOSIS — E785 Hyperlipidemia, unspecified: Secondary | ICD-10-CM | POA: Diagnosis not present

## 2016-05-21 DIAGNOSIS — I69320 Aphasia following cerebral infarction: Secondary | ICD-10-CM | POA: Diagnosis not present

## 2016-05-21 DIAGNOSIS — M6281 Muscle weakness (generalized): Secondary | ICD-10-CM | POA: Diagnosis not present

## 2016-05-21 DIAGNOSIS — I6522 Occlusion and stenosis of left carotid artery: Secondary | ICD-10-CM | POA: Diagnosis not present

## 2016-05-21 DIAGNOSIS — I10 Essential (primary) hypertension: Secondary | ICD-10-CM | POA: Diagnosis not present

## 2016-05-21 DIAGNOSIS — K219 Gastro-esophageal reflux disease without esophagitis: Secondary | ICD-10-CM | POA: Diagnosis not present

## 2016-05-21 DIAGNOSIS — R1312 Dysphagia, oropharyngeal phase: Secondary | ICD-10-CM | POA: Diagnosis not present

## 2016-05-21 DIAGNOSIS — M810 Age-related osteoporosis without current pathological fracture: Secondary | ICD-10-CM | POA: Diagnosis not present

## 2016-05-22 DIAGNOSIS — M810 Age-related osteoporosis without current pathological fracture: Secondary | ICD-10-CM | POA: Diagnosis not present

## 2016-05-22 DIAGNOSIS — S52572D Other intraarticular fracture of lower end of left radius, subsequent encounter for closed fracture with routine healing: Secondary | ICD-10-CM | POA: Diagnosis not present

## 2016-05-22 DIAGNOSIS — Z79899 Other long term (current) drug therapy: Secondary | ICD-10-CM | POA: Diagnosis not present

## 2016-05-22 DIAGNOSIS — K219 Gastro-esophageal reflux disease without esophagitis: Secondary | ICD-10-CM | POA: Diagnosis not present

## 2016-05-22 DIAGNOSIS — R1312 Dysphagia, oropharyngeal phase: Secondary | ICD-10-CM | POA: Diagnosis not present

## 2016-05-22 DIAGNOSIS — I10 Essential (primary) hypertension: Secondary | ICD-10-CM | POA: Diagnosis not present

## 2016-05-22 DIAGNOSIS — I6522 Occlusion and stenosis of left carotid artery: Secondary | ICD-10-CM | POA: Diagnosis not present

## 2016-05-22 DIAGNOSIS — E785 Hyperlipidemia, unspecified: Secondary | ICD-10-CM | POA: Diagnosis not present

## 2016-05-22 DIAGNOSIS — S42255D Nondisplaced fracture of greater tuberosity of left humerus, subsequent encounter for fracture with routine healing: Secondary | ICD-10-CM | POA: Diagnosis not present

## 2016-05-22 DIAGNOSIS — M6281 Muscle weakness (generalized): Secondary | ICD-10-CM | POA: Diagnosis not present

## 2016-05-22 DIAGNOSIS — J449 Chronic obstructive pulmonary disease, unspecified: Secondary | ICD-10-CM | POA: Diagnosis not present

## 2016-05-22 DIAGNOSIS — I69320 Aphasia following cerebral infarction: Secondary | ICD-10-CM | POA: Diagnosis not present

## 2016-05-23 ENCOUNTER — Ambulatory Visit (INDEPENDENT_AMBULATORY_CARE_PROVIDER_SITE_OTHER): Payer: Medicare HMO

## 2016-05-23 ENCOUNTER — Other Ambulatory Visit: Payer: Self-pay | Admitting: Neurology

## 2016-05-23 DIAGNOSIS — I6522 Occlusion and stenosis of left carotid artery: Secondary | ICD-10-CM | POA: Diagnosis not present

## 2016-05-23 DIAGNOSIS — J449 Chronic obstructive pulmonary disease, unspecified: Secondary | ICD-10-CM | POA: Diagnosis not present

## 2016-05-23 DIAGNOSIS — I10 Essential (primary) hypertension: Secondary | ICD-10-CM | POA: Diagnosis not present

## 2016-05-23 DIAGNOSIS — I639 Cerebral infarction, unspecified: Secondary | ICD-10-CM

## 2016-05-23 DIAGNOSIS — I63412 Cerebral infarction due to embolism of left middle cerebral artery: Secondary | ICD-10-CM

## 2016-05-23 DIAGNOSIS — I69320 Aphasia following cerebral infarction: Secondary | ICD-10-CM | POA: Diagnosis not present

## 2016-05-23 DIAGNOSIS — M810 Age-related osteoporosis without current pathological fracture: Secondary | ICD-10-CM | POA: Diagnosis not present

## 2016-05-23 DIAGNOSIS — K219 Gastro-esophageal reflux disease without esophagitis: Secondary | ICD-10-CM | POA: Diagnosis not present

## 2016-05-23 DIAGNOSIS — E785 Hyperlipidemia, unspecified: Secondary | ICD-10-CM | POA: Diagnosis not present

## 2016-05-23 DIAGNOSIS — R1312 Dysphagia, oropharyngeal phase: Secondary | ICD-10-CM | POA: Diagnosis not present

## 2016-05-23 DIAGNOSIS — I4891 Unspecified atrial fibrillation: Secondary | ICD-10-CM

## 2016-05-23 DIAGNOSIS — M6281 Muscle weakness (generalized): Secondary | ICD-10-CM | POA: Diagnosis not present

## 2016-05-24 DIAGNOSIS — K219 Gastro-esophageal reflux disease without esophagitis: Secondary | ICD-10-CM | POA: Diagnosis not present

## 2016-05-24 DIAGNOSIS — R1312 Dysphagia, oropharyngeal phase: Secondary | ICD-10-CM | POA: Diagnosis not present

## 2016-05-24 DIAGNOSIS — M6281 Muscle weakness (generalized): Secondary | ICD-10-CM | POA: Diagnosis not present

## 2016-05-24 DIAGNOSIS — I69320 Aphasia following cerebral infarction: Secondary | ICD-10-CM | POA: Diagnosis not present

## 2016-05-24 DIAGNOSIS — E785 Hyperlipidemia, unspecified: Secondary | ICD-10-CM | POA: Diagnosis not present

## 2016-05-24 DIAGNOSIS — I10 Essential (primary) hypertension: Secondary | ICD-10-CM | POA: Diagnosis not present

## 2016-05-24 DIAGNOSIS — M810 Age-related osteoporosis without current pathological fracture: Secondary | ICD-10-CM | POA: Diagnosis not present

## 2016-05-24 DIAGNOSIS — J449 Chronic obstructive pulmonary disease, unspecified: Secondary | ICD-10-CM | POA: Diagnosis not present

## 2016-05-24 DIAGNOSIS — I6522 Occlusion and stenosis of left carotid artery: Secondary | ICD-10-CM | POA: Diagnosis not present

## 2016-05-25 DIAGNOSIS — M6281 Muscle weakness (generalized): Secondary | ICD-10-CM | POA: Diagnosis not present

## 2016-05-25 DIAGNOSIS — J449 Chronic obstructive pulmonary disease, unspecified: Secondary | ICD-10-CM | POA: Diagnosis not present

## 2016-05-25 DIAGNOSIS — K219 Gastro-esophageal reflux disease without esophagitis: Secondary | ICD-10-CM | POA: Diagnosis not present

## 2016-05-25 DIAGNOSIS — I6522 Occlusion and stenosis of left carotid artery: Secondary | ICD-10-CM | POA: Diagnosis not present

## 2016-05-25 DIAGNOSIS — I10 Essential (primary) hypertension: Secondary | ICD-10-CM | POA: Diagnosis not present

## 2016-05-25 DIAGNOSIS — M810 Age-related osteoporosis without current pathological fracture: Secondary | ICD-10-CM | POA: Diagnosis not present

## 2016-05-25 DIAGNOSIS — E785 Hyperlipidemia, unspecified: Secondary | ICD-10-CM | POA: Diagnosis not present

## 2016-05-25 DIAGNOSIS — I69320 Aphasia following cerebral infarction: Secondary | ICD-10-CM | POA: Diagnosis not present

## 2016-05-25 DIAGNOSIS — R1312 Dysphagia, oropharyngeal phase: Secondary | ICD-10-CM | POA: Diagnosis not present

## 2016-05-28 DIAGNOSIS — R1312 Dysphagia, oropharyngeal phase: Secondary | ICD-10-CM | POA: Diagnosis not present

## 2016-05-28 DIAGNOSIS — I10 Essential (primary) hypertension: Secondary | ICD-10-CM | POA: Diagnosis not present

## 2016-05-28 DIAGNOSIS — M810 Age-related osteoporosis without current pathological fracture: Secondary | ICD-10-CM | POA: Diagnosis not present

## 2016-05-28 DIAGNOSIS — I69320 Aphasia following cerebral infarction: Secondary | ICD-10-CM | POA: Diagnosis not present

## 2016-05-28 DIAGNOSIS — M6281 Muscle weakness (generalized): Secondary | ICD-10-CM | POA: Diagnosis not present

## 2016-05-28 DIAGNOSIS — K219 Gastro-esophageal reflux disease without esophagitis: Secondary | ICD-10-CM | POA: Diagnosis not present

## 2016-05-28 DIAGNOSIS — E785 Hyperlipidemia, unspecified: Secondary | ICD-10-CM | POA: Diagnosis not present

## 2016-05-28 DIAGNOSIS — I6522 Occlusion and stenosis of left carotid artery: Secondary | ICD-10-CM | POA: Diagnosis not present

## 2016-05-28 DIAGNOSIS — J449 Chronic obstructive pulmonary disease, unspecified: Secondary | ICD-10-CM | POA: Diagnosis not present

## 2016-05-29 DIAGNOSIS — J449 Chronic obstructive pulmonary disease, unspecified: Secondary | ICD-10-CM | POA: Diagnosis not present

## 2016-05-29 DIAGNOSIS — I69320 Aphasia following cerebral infarction: Secondary | ICD-10-CM | POA: Diagnosis not present

## 2016-05-29 DIAGNOSIS — E785 Hyperlipidemia, unspecified: Secondary | ICD-10-CM | POA: Diagnosis not present

## 2016-05-29 DIAGNOSIS — I10 Essential (primary) hypertension: Secondary | ICD-10-CM | POA: Diagnosis not present

## 2016-05-29 DIAGNOSIS — K219 Gastro-esophageal reflux disease without esophagitis: Secondary | ICD-10-CM | POA: Diagnosis not present

## 2016-05-29 DIAGNOSIS — M6281 Muscle weakness (generalized): Secondary | ICD-10-CM | POA: Diagnosis not present

## 2016-05-29 DIAGNOSIS — I6522 Occlusion and stenosis of left carotid artery: Secondary | ICD-10-CM | POA: Diagnosis not present

## 2016-05-29 DIAGNOSIS — M810 Age-related osteoporosis without current pathological fracture: Secondary | ICD-10-CM | POA: Diagnosis not present

## 2016-05-29 DIAGNOSIS — R1312 Dysphagia, oropharyngeal phase: Secondary | ICD-10-CM | POA: Diagnosis not present

## 2016-05-30 DIAGNOSIS — K219 Gastro-esophageal reflux disease without esophagitis: Secondary | ICD-10-CM | POA: Diagnosis not present

## 2016-05-30 DIAGNOSIS — I69320 Aphasia following cerebral infarction: Secondary | ICD-10-CM | POA: Diagnosis not present

## 2016-05-30 DIAGNOSIS — M810 Age-related osteoporosis without current pathological fracture: Secondary | ICD-10-CM | POA: Diagnosis not present

## 2016-05-30 DIAGNOSIS — E785 Hyperlipidemia, unspecified: Secondary | ICD-10-CM | POA: Diagnosis not present

## 2016-05-30 DIAGNOSIS — I10 Essential (primary) hypertension: Secondary | ICD-10-CM | POA: Diagnosis not present

## 2016-05-30 DIAGNOSIS — R1312 Dysphagia, oropharyngeal phase: Secondary | ICD-10-CM | POA: Diagnosis not present

## 2016-05-30 DIAGNOSIS — J449 Chronic obstructive pulmonary disease, unspecified: Secondary | ICD-10-CM | POA: Diagnosis not present

## 2016-05-30 DIAGNOSIS — I6522 Occlusion and stenosis of left carotid artery: Secondary | ICD-10-CM | POA: Diagnosis not present

## 2016-05-30 DIAGNOSIS — M6281 Muscle weakness (generalized): Secondary | ICD-10-CM | POA: Diagnosis not present

## 2016-06-06 DIAGNOSIS — I639 Cerebral infarction, unspecified: Secondary | ICD-10-CM | POA: Diagnosis not present

## 2016-06-06 DIAGNOSIS — S52502D Unspecified fracture of the lower end of left radius, subsequent encounter for closed fracture with routine healing: Secondary | ICD-10-CM | POA: Diagnosis not present

## 2016-06-06 DIAGNOSIS — R300 Dysuria: Secondary | ICD-10-CM | POA: Diagnosis not present

## 2016-06-06 DIAGNOSIS — I10 Essential (primary) hypertension: Secondary | ICD-10-CM | POA: Diagnosis not present

## 2016-06-07 DIAGNOSIS — S52572D Other intraarticular fracture of lower end of left radius, subsequent encounter for closed fracture with routine healing: Secondary | ICD-10-CM | POA: Diagnosis not present

## 2016-06-07 DIAGNOSIS — S42255D Nondisplaced fracture of greater tuberosity of left humerus, subsequent encounter for fracture with routine healing: Secondary | ICD-10-CM | POA: Diagnosis not present

## 2016-06-15 DIAGNOSIS — F322 Major depressive disorder, single episode, severe without psychotic features: Secondary | ICD-10-CM | POA: Diagnosis not present

## 2016-06-15 DIAGNOSIS — F0391 Unspecified dementia with behavioral disturbance: Secondary | ICD-10-CM | POA: Diagnosis not present

## 2016-06-15 DIAGNOSIS — F411 Generalized anxiety disorder: Secondary | ICD-10-CM | POA: Diagnosis not present

## 2016-06-15 DIAGNOSIS — S62102A Fracture of unspecified carpal bone, left wrist, initial encounter for closed fracture: Secondary | ICD-10-CM | POA: Diagnosis not present

## 2016-06-15 DIAGNOSIS — I69320 Aphasia following cerebral infarction: Secondary | ICD-10-CM | POA: Diagnosis not present

## 2016-06-15 DIAGNOSIS — I10 Essential (primary) hypertension: Secondary | ICD-10-CM | POA: Diagnosis not present

## 2016-06-19 DIAGNOSIS — I10 Essential (primary) hypertension: Secondary | ICD-10-CM | POA: Diagnosis not present

## 2016-06-19 DIAGNOSIS — I639 Cerebral infarction, unspecified: Secondary | ICD-10-CM | POA: Diagnosis not present

## 2016-06-19 DIAGNOSIS — R451 Restlessness and agitation: Secondary | ICD-10-CM | POA: Diagnosis not present

## 2016-06-19 DIAGNOSIS — S52502D Unspecified fracture of the lower end of left radius, subsequent encounter for closed fracture with routine healing: Secondary | ICD-10-CM | POA: Diagnosis not present

## 2016-06-21 DIAGNOSIS — Z79899 Other long term (current) drug therapy: Secondary | ICD-10-CM | POA: Diagnosis not present

## 2016-06-21 DIAGNOSIS — R319 Hematuria, unspecified: Secondary | ICD-10-CM | POA: Diagnosis not present

## 2016-06-21 DIAGNOSIS — N390 Urinary tract infection, site not specified: Secondary | ICD-10-CM | POA: Diagnosis not present

## 2016-06-28 DIAGNOSIS — F0391 Unspecified dementia with behavioral disturbance: Secondary | ICD-10-CM | POA: Diagnosis not present

## 2016-06-28 DIAGNOSIS — F320 Major depressive disorder, single episode, mild: Secondary | ICD-10-CM | POA: Diagnosis not present

## 2016-06-28 DIAGNOSIS — F419 Anxiety disorder, unspecified: Secondary | ICD-10-CM | POA: Diagnosis not present

## 2016-06-29 ENCOUNTER — Emergency Department (HOSPITAL_COMMUNITY): Payer: Medicare HMO

## 2016-06-29 ENCOUNTER — Emergency Department (HOSPITAL_COMMUNITY)
Admission: EM | Admit: 2016-06-29 | Discharge: 2016-07-01 | Disposition: A | Discharge status: Other Acute Inpatient Hospital | Payer: Medicare HMO | Attending: Emergency Medicine | Admitting: Emergency Medicine

## 2016-06-29 DIAGNOSIS — R410 Disorientation, unspecified: Secondary | ICD-10-CM | POA: Diagnosis not present

## 2016-06-29 DIAGNOSIS — F0150 Vascular dementia without behavioral disturbance: Secondary | ICD-10-CM | POA: Diagnosis present

## 2016-06-29 DIAGNOSIS — I129 Hypertensive chronic kidney disease with stage 1 through stage 4 chronic kidney disease, or unspecified chronic kidney disease: Secondary | ICD-10-CM | POA: Insufficient documentation

## 2016-06-29 DIAGNOSIS — Z79899 Other long term (current) drug therapy: Secondary | ICD-10-CM | POA: Insufficient documentation

## 2016-06-29 DIAGNOSIS — R45851 Suicidal ideations: Secondary | ICD-10-CM | POA: Diagnosis not present

## 2016-06-29 DIAGNOSIS — Z8673 Personal history of transient ischemic attack (TIA), and cerebral infarction without residual deficits: Secondary | ICD-10-CM | POA: Diagnosis not present

## 2016-06-29 DIAGNOSIS — Z7982 Long term (current) use of aspirin: Secondary | ICD-10-CM | POA: Insufficient documentation

## 2016-06-29 DIAGNOSIS — J449 Chronic obstructive pulmonary disease, unspecified: Secondary | ICD-10-CM | POA: Insufficient documentation

## 2016-06-29 DIAGNOSIS — Z049 Encounter for examination and observation for unspecified reason: Secondary | ICD-10-CM

## 2016-06-29 DIAGNOSIS — N183 Chronic kidney disease, stage 3 (moderate): Secondary | ICD-10-CM | POA: Diagnosis not present

## 2016-06-29 DIAGNOSIS — Z87891 Personal history of nicotine dependence: Secondary | ICD-10-CM | POA: Diagnosis not present

## 2016-06-29 DIAGNOSIS — R4182 Altered mental status, unspecified: Secondary | ICD-10-CM | POA: Diagnosis not present

## 2016-06-29 LAB — URINALYSIS, ROUTINE W REFLEX MICROSCOPIC
Bilirubin Urine: NEGATIVE
GLUCOSE, UA: NEGATIVE mg/dL
HGB URINE DIPSTICK: NEGATIVE
KETONES UR: NEGATIVE mg/dL
LEUKOCYTES UA: NEGATIVE
Nitrite: NEGATIVE
PH: 7 (ref 5.0–8.0)
Protein, ur: NEGATIVE mg/dL
Specific Gravity, Urine: 1.003 — ABNORMAL LOW (ref 1.005–1.030)

## 2016-06-29 LAB — COMPREHENSIVE METABOLIC PANEL
ALBUMIN: 4.1 g/dL (ref 3.5–5.0)
ALT: 11 U/L — AB (ref 14–54)
AST: 22 U/L (ref 15–41)
Alkaline Phosphatase: 125 U/L (ref 38–126)
Anion gap: 8 (ref 5–15)
BUN: 7 mg/dL (ref 6–20)
CHLORIDE: 100 mmol/L — AB (ref 101–111)
CO2: 28 mmol/L (ref 22–32)
Calcium: 9.6 mg/dL (ref 8.9–10.3)
Creatinine, Ser: 1.13 mg/dL — ABNORMAL HIGH (ref 0.44–1.00)
GFR calc Af Amer: 55 mL/min — ABNORMAL LOW (ref >60)
GFR calc non Af Amer: 47 mL/min — ABNORMAL LOW (ref >60)
GLUCOSE: 143 mg/dL — AB (ref 65–99)
POTASSIUM: 3.2 mmol/L — AB (ref 3.5–5.1)
Sodium: 136 mmol/L (ref 135–145)
Total Bilirubin: 0.3 mg/dL (ref 0.3–1.2)
Total Protein: 7.8 g/dL (ref 6.5–8.1)

## 2016-06-29 LAB — CBC
HEMATOCRIT: 34.5 % — AB (ref 36.0–46.0)
Hemoglobin: 11.6 g/dL — ABNORMAL LOW (ref 12.0–15.0)
MCH: 29.1 pg (ref 26.0–34.0)
MCHC: 33.6 g/dL (ref 30.0–36.0)
MCV: 86.7 fL (ref 78.0–100.0)
PLATELETS: 444 10*3/uL — AB (ref 150–400)
RBC: 3.98 MIL/uL (ref 3.87–5.11)
RDW: 13.1 % (ref 11.5–15.5)
WBC: 7.7 10*3/uL (ref 4.0–10.5)

## 2016-06-29 MED ORDER — QUETIAPINE FUMARATE 300 MG PO TABS
400.0000 mg | ORAL_TABLET | Freq: Every day | ORAL | Status: DC
  Administered 2016-06-29 – 2016-06-30 (×2): 400 mg via ORAL
  Filled 2016-06-29 (×2): qty 1

## 2016-06-29 MED ORDER — LORAZEPAM 0.5 MG PO TABS
0.5000 mg | ORAL_TABLET | Freq: Two times a day (BID) | ORAL | Status: DC | PRN
  Administered 2016-06-29: 0.5 mg via ORAL
  Filled 2016-06-29: qty 1

## 2016-06-29 MED ORDER — ATORVASTATIN CALCIUM 20 MG PO TABS
20.0000 mg | ORAL_TABLET | Freq: Every day | ORAL | Status: DC
  Filled 2016-06-29 (×3): qty 1

## 2016-06-29 MED ORDER — CLOPIDOGREL BISULFATE 75 MG PO TABS: 75.0000 mg | ORAL_TABLET | Freq: Every day | ORAL | Status: DC

## 2016-06-29 MED ORDER — MONTELUKAST SODIUM 10 MG PO TABS: 10.0000 mg | ORAL_TABLET | Freq: Every day | ORAL | Status: DC

## 2016-06-29 MED ORDER — QUINAPRIL HCL 10 MG PO TABS: 40.0000 mg | ORAL_TABLET | Freq: Every day | ORAL | Status: DC

## 2016-06-29 MED ORDER — GABAPENTIN 300 MG PO CAPS
300.0000 mg | ORAL_CAPSULE | Freq: Every day | ORAL | Status: DC
  Administered 2016-06-30: 300 mg via ORAL
  Filled 2016-06-29: qty 1

## 2016-06-29 MED ORDER — PANTOPRAZOLE SODIUM 40 MG PO TBEC
40.0000 mg | DELAYED_RELEASE_TABLET | Freq: Every day | ORAL | Status: DC
  Administered 2016-06-29 – 2016-07-01 (×4): 40 mg via ORAL
  Filled 2016-06-29 (×3): qty 1

## 2016-06-29 NOTE — ED Triage Notes (Signed)
Pt brought in by police IVC'd for being combative towards staff at assisted living facility and threatening to hang herself. Pt calm and cooperative at this time.

## 2016-06-29 NOTE — BHH Counselor (Signed)
Clinician discussed case with extender: Lindon Romp, NP recommends geropsychiatric treatment.  Disposition discussed with Tobin Chad, RN.     Edd Fabian, MS, [facility], [facility] Triage Specialist [phone]

## 2016-06-29 NOTE — ED Notes (Signed)
Bed: WA27 Expected date:  Expected time:  Means of arrival:  Comments: [employer]

## 2016-06-29 NOTE — ED Notes (Signed)
Patients daughter contact info:  [name] [phone]

## 2016-06-29 NOTE — BH Assessment (Addendum)
Assessment Note  [name] is an 71 y.o. female that denies SI/HI/Psychosis/Substance Abuse.  Patient was brought to the ED by IVC from the assisted living facility due to being combative towards staff and threatening to hang herself.   During the assessment the patient denies making threatening statements to harm herself.  Patient reports that she only made statements that she wants to leave the facility and go back to her home.   Per documentation int he epic chart the patient reports that she has been at facility for several months and just wants to go home.  She states she packed up her stuff and called a cab a few days ago, and says staff did not like that.    Diagnosis: Major Depressive Disorder   Past Medical History:  Past Medical History:  Diagnosis Date  . Carotid artery aneurysm ([place]) 03/04/2012  . COPD (chronic obstructive pulmonary disease) ([place])   . Depression   . DJD (degenerative joint disease)   . GERD (gastroesophageal reflux disease)   . Hypertension   . Stroke [facility])    was on Plavix - not now    Past Surgical History:  Procedure Laterality Date  . CATARACT EXTRACTION Bilateral   . COLONOSCOPY  2004  . HERNIA REPAIR Right 2014   inguinal  . KYPHOPLASTY N/A 10/28/2014   Procedure: KYPHOPLASTY;  Surgeon: Phylliss Bob, MD;  Location: [facility];  Service: Orthopedics;  Laterality: N/A;  Lumbar 1 kyphoplasty  . MICROLARYNGOSCOPY WITH CO2 LASER AND EXCISION OF VOCAL CORD LESION N/A 06/05/2013   Procedure: MICROLARYNGOSCOPY WITH CO2 LASER AND EXCISION OF VOCAL CORD POLYPS;  Surgeon: Melida Quitter, MD;  Location: [facility];  Service: ENT;  Laterality: N/A;  . TONSILLECTOMY    . TUBAL LIGATION  30 years  . VIDEO BRONCHOSCOPY  04/24/2012   Procedure: VIDEO BRONCHOSCOPY WITHOUT FLUORO;  Surgeon: Rigoberto Noel, MD;  Location: [facility];  Service: Cardiopulmonary;  Laterality: Bilateral;    Family History:  Family History  Problem Relation Age of Onset  . Breast cancer  Mother   . Breast cancer Sister     Social History:  reports that she has quit smoking. Her smoking use included Cigarettes. She has a 7.50 pack-year smoking history. She has never used smokeless tobacco. She reports that she does not drink alcohol or use drugs.  Additional Social History:  Alcohol / Drug Use History of alcohol / drug use?: No history of alcohol / drug abuse  CIWA: CIWA-Ar BP: 136/90 Pulse Rate: (!) 112 COWS:    Allergies:  Allergies  Allergen Reactions  . Amoxapine And Related Nausea And Vomiting  . Amoxicillin     itching  . Ciprocinonide [Fluocinolone]     Pt unsure  . Ciprofloxacin     Pt unsure  . Doxycycline Nausea And Vomiting  . Erythromycin Hives  . Sulfa Antibiotics Other (See Comments)    Unknown-reaction as a child  . Zofran [Ondansetron Hcl] Nausea And Vomiting    Home Medications:  (Not in a hospital admission)  OB/GYN Status:  No LMP recorded. Patient is postmenopausal.  General Assessment Data [facility] of Assessment: [facility] TTS Assessment: In system Is this a Tele or Face-to-Face Assessment?: Face-to-Face Is this an Initial Assessment or a Re-assessment for this encounter?: Initial Assessment Marital status: Divorced [place] name: NA Is patient pregnant?: No Pregnancy Status: No Living Arrangements: Other (Comment) (aSSISTED lIVING fACILITY ) Can pt return to current living arrangement?: Yes Admission Status: Involuntary Is patient  capable of signing voluntary admission?: No Referral Source: Self/Family/Friend Insurance type: Medicare   Medical Screening Exam ([place]) Medical Exam completed:  (NA)  Crisis Care Plan Living Arrangements: Other (Comment) (aSSISTED lIVING fACILITY ) Legal Guardian:  (NA) Name of Psychiatrist: NA Name of Therapist: NA  Education Status Is patient currently in school?: No Current Grade: NA Highest grade of school patient has completed: NA Name of school: NA Contact person: NA  Risk  to self with the past 6 months Suicidal Ideation: No Has patient been a risk to self within the past 6 months prior to admission? : No Suicidal Intent: No Has patient had any suicidal intent within the past 6 months prior to admission? : No Is patient at risk for suicide?: No Suicidal Plan?: No Has patient had any suicidal plan within the past 6 months prior to admission? : No Access to Means: No What has been your use of drugs/alcohol within the last 12 months?: NA Previous Attempts/Gestures: No How many times?: 0 Other Self Harm Risks: NA Triggers for Past Attempts:  (NA) Intentional Self Injurious Behavior: None Family Suicide History: No Recent stressful life event(s): Other (Comment) (Pt wants to move from the assisted living facility ) Persecutory voices/beliefs?: No Depression: Yes Depression Symptoms: Tearfulness, Guilt, Feeling worthless/self pity Substance abuse history and/or treatment for substance abuse?: No Suicide prevention information given to non-admitted patients: Not applicable  Risk to Others within the past 6 months Homicidal Ideation: No Does patient have any lifetime risk of violence toward others beyond the six months prior to admission? : No Thoughts of Harm to Others: No Current Homicidal Intent: No Current Homicidal Plan: No Access to Homicidal Means: No Identified Victim: NA History of harm to others?: No Assessment of Violence: None Noted Violent Behavior Description: NA Does patient have access to weapons?: No Criminal Charges Pending?: No Does patient have a court date: No Is patient on probation?: No  Psychosis Hallucinations: None noted Delusions: None noted  Mental Status Report Appearance/Hygiene: Disheveled Eye Contact: Poor Motor Activity: Freedom of movement Speech: Logical/coherent Level of Consciousness: Alert, Restless Mood: Depressed Affect: Appropriate to circumstance Anxiety Level: None Thought Processes: Coherent,  Relevant Judgement: Unimpaired Orientation: Person, Place, Time, Situation Obsessive Compulsive Thoughts/Behaviors: None  Cognitive Functioning Concentration: Normal Memory: Recent Intact, Remote Intact IQ: Average Insight: Fair Impulse Control: Fair Appetite: Fair Weight Loss: 0 Weight Gain: 0 Sleep: Decreased Total Hours of Sleep: 5 Vegetative Symptoms: Staying in bed  ADLScreening [facility] Assessment Services) Patient's cognitive ability adequate to safely complete daily activities?: Yes Patient able to express need for assistance with ADLs?: Yes Independently performs ADLs?: Yes (appropriate for developmental age)  Prior Inpatient Therapy Prior Inpatient Therapy: No Prior Therapy Dates: NA Prior Therapy Facilty/Provider(s): NA Reason for Treatment: NA  Prior Outpatient Therapy Prior Outpatient Therapy: No Prior Therapy Dates: NA Prior Therapy Facilty/Provider(s): NA Reason for Treatment: NA Does patient have an ACCT team?: No Does patient have Intensive In-House Services?  : No Does patient have [facility] services? : No Does patient have P4CC services?: No  ADL Screening (condition at time of admission) Patient's cognitive ability adequate to safely complete daily activities?: Yes Is the patient deaf or have difficulty hearing?: No Does the patient have difficulty seeing, even when wearing glasses/contacts?: No Does the patient have difficulty concentrating, remembering, or making decisions?: No Patient able to express need for assistance with ADLs?: Yes Does the patient have difficulty dressing or bathing?: No Independently performs ADLs?: Yes (appropriate for developmental  age) Does the patient have difficulty walking or climbing stairs?: Yes Weakness of Legs: None Weakness of Arms/Hands: None  Home Assistive Devices/Equipment Home Assistive Devices/Equipment: None    Abuse/Neglect Assessment (Assessment to be complete while patient is alone) Physical Abuse:  Denies Verbal Abuse: Denies Sexual Abuse: Denies Exploitation of patient/patient's resources: Denies Self-Neglect: Denies Values / Beliefs Cultural Requests During Hospitalization: None Spiritual Requests During Hospitalization: None Consults Spiritual Care Consult Needed: No Social Work Consult Needed: No Regulatory affairs officer (For Healthcare) Does Patient Have a Medical Advance Directive?: No Would patient like information on creating a medical advance directive?: No - Patient declined    Additional Information 1:1 In Past 12 Months?: No CIRT Risk: No Elopement Risk: No Does patient have medical clearance?: Yes     Disposition: Pending psych disposition  Disposition Initial Assessment Completed for this Encounter: Yes  On Site Evaluation by:   Reviewed with Physician:    Graciella Freer [place] 06/29/2016 6:20 PM

## 2016-06-29 NOTE — ED Provider Notes (Signed)
[facility] DEPT Provider Note   CSN: [id number] Arrival date & time: 06/29/16  1441     History   Chief Complaint Chief Complaint  Patient presents with  . Medical Clearance  . IVC    HPI [name] is a 71 y.o. female.  Patient with hx htn, dva, copd, from [facility] with IVC as staff indicates pt has been agitated at times, threatening staff, balling up fist, threatening to throw things.  Patient states she has been at facility for several months and just wants to go home.  She states she packed up her stuff and called a cab a few days ago, and says staff did not like that. Patient states physical health at baseline. Is confused, not oriented to day/date/year. Pt unaware of change in her meds. Denies depression. Denies thoughts of harm to self or others.    The history is provided by the patient. The history is limited by the condition of the patient.    Past Medical History:  Diagnosis Date  . Carotid artery aneurysm ([place]) 03/04/2012  . COPD (chronic obstructive pulmonary disease) ([place])   . Depression   . DJD (degenerative joint disease)   . GERD (gastroesophageal reflux disease)   . Hypertension   . Stroke [facility])    was on Plavix - not now    Patient Active Problem List   Diagnosis Date Noted  . Aphasia 05/15/2016  . Atherosclerosis of left carotid artery   . Cerebral embolism with cerebral infarction 02/27/2016  . Acute encephalopathy 02/26/2016  . Osteoporosis 07/04/2015  . Impaired cognition 03/30/2014  . Vitamin D deficiency 08/18/2013  . Hyperlipidemia 08/18/2013  . Prediabetes 08/18/2013  . Medication management 08/18/2013  . CKD (chronic kidney disease) stage 3, GFR 30-59 ml/min 07/01/2013  . Anemia of chronic disease 07/01/2013  . Depression, major, in remission ([place]) 06/30/2013  . Essential hypertension 02/09/2013  . COPD GOLD III 03/28/2012  . Vocal cord dysfunction 03/28/2012  . Carotid artery aneurysm ([place]) 03/04/2012  . Smoker 03/04/2012     Past Surgical History:  Procedure Laterality Date  . CATARACT EXTRACTION Bilateral   . COLONOSCOPY  2004  . HERNIA REPAIR Right 2014   inguinal  . KYPHOPLASTY N/A 10/28/2014   Procedure: KYPHOPLASTY;  Surgeon: Phylliss Bob, MD;  Location: [facility];  Service: Orthopedics;  Laterality: N/A;  Lumbar 1 kyphoplasty  . MICROLARYNGOSCOPY WITH CO2 LASER AND EXCISION OF VOCAL CORD LESION N/A 06/05/2013   Procedure: MICROLARYNGOSCOPY WITH CO2 LASER AND EXCISION OF VOCAL CORD POLYPS;  Surgeon: Melida Quitter, MD;  Location: [facility];  Service: ENT;  Laterality: N/A;  . TONSILLECTOMY    . TUBAL LIGATION  30 years  . VIDEO BRONCHOSCOPY  04/24/2012   Procedure: VIDEO BRONCHOSCOPY WITHOUT FLUORO;  Surgeon: Rigoberto Noel, MD;  Location: [facility];  Service: Cardiopulmonary;  Laterality: Bilateral;    OB History    No data available       Home Medications    Prior to Admission medications   Medication Sig Start Date End Date Taking? Authorizing Provider  aspirin 325 MG tablet Take 1 tablet (325 mg total) by mouth daily. 02/29/16   Reyne Dumas, MD  atorvastatin (LIPITOR) 20 MG tablet Take 1 tablet (20 mg total) by mouth daily at 6 PM. 02/29/16   Reyne Dumas, MD  b complex vitamins tablet Take 1 tablet by mouth daily. 01/25/16   Courtney Forcucci, PA-C  cholecalciferol (VITAMIN D) 1000 UNITS tablet Take 5,000 Units by mouth daily.  Historical Provider, MD  clopidogrel (PLAVIX) 75 MG tablet Take 1 tablet (75 mg total) by mouth daily. 02/29/16   Reyne Dumas, MD  docusate sodium (COLACE) 100 MG capsule Take 1 capsule (100 mg total) by mouth every 12 (twelve) hours. 04/26/16   Kristen N Ward, DO  gabapentin (NEURONTIN) 600 MG tablet TAKE 1/2 TO 1 TABLET BY MOUTH THREE TIMES DAILY AS DIRECTED 04/09/16   Unk Pinto, MD  HYDROcodone-acetaminophen (NORCO/VICODIN) 5-325 MG tablet Take 1-2 tablets by mouth every 6 (six) hours as needed. 04/26/16   Kristen N Ward, DO  LORazepam (ATIVAN) 0.5 MG tablet Take 1  tablet (0.5 mg total) by mouth 2 (two) times daily as needed for anxiety. 02/29/16   Reyne Dumas, MD  Magnesium 500 MG TABS Take 500 mg tablet twice daily with meals. 01/25/16   Courtney Forcucci, PA-C  mirtazapine (REMERON) 45 MG tablet TAKE 1 TABLET BY MOUTH EVERY NIGHT AT BEDTIME Patient taking differently: TAKE 45 MG BY MOUTH EVERY NIGHT AT BEDTIME 02/10/16   Vicie Mutters, PA-C  montelukast (SINGULAIR) 10 MG tablet TAKE 1 TABLET BY MOUTH EVERY DAY 05/19/16   Unk Pinto, MD  oxybutynin (DITROPAN) 5 MG tablet TAKE 1/2 TO 1 TABLET BY MOUTH THREE TIMES DAILY FOR BLADDER CONTROL 04/23/16   Unk Pinto, MD  pantoprazole (PROTONIX) 40 MG tablet Take 1 tablet (40 mg total) by mouth daily. 02/29/16   Reyne Dumas, MD  QUEtiapine (SEROQUEL) 400 MG tablet TAKE 1 TABLET BY MOUTH EVERY NIGHT AT BEDTIME 04/09/16   Unk Pinto, MD  quinapril (ACCUPRIL) 40 MG tablet TAKE 1 TABLET BY MOUTH AT BEDTIME 05/19/16   Unk Pinto, MD  ranitidine (ZANTAC) 300 MG tablet TAKE 1 TABLET(300 MG) BY MOUTH TWICE DAILY 04/23/16   Unk Pinto, MD  traMADol (ULTRAM) 50 MG tablet Take 1 tablet (50 mg total) by mouth 2 (two) times daily as needed. 02/29/16   Reyne Dumas, MD  vitamin C (ASCORBIC ACID) 500 MG tablet Take 500 mg by mouth daily.    Historical Provider, MD    Family History Family History  Problem Relation Age of Onset  . Breast cancer Mother   . Breast cancer Sister     Social History Social History  Substance Use Topics  . Smoking status: Former Smoker    Packs/day: 0.25    Years: 30.00    Types: Cigarettes  . Smokeless tobacco: Never Used  . Alcohol use No     Allergies   Amoxapine and related; Amoxicillin; Ciprocinonide [fluocinolone]; Ciprofloxacin; Doxycycline; Erythromycin; Sulfa antibiotics; and Zofran [ondansetron hcl]   Review of Systems Review of Systems  Constitutional: Negative for chills and fever.  HENT: Negative for sore throat.   Eyes: Negative for visual  disturbance.  Respiratory: Negative for shortness of breath.   Cardiovascular: Negative for chest pain.  Gastrointestinal: Negative for abdominal pain, diarrhea and vomiting.  Genitourinary: Negative for dysuria and flank pain.  Musculoskeletal: Negative for back pain and neck pain.  Skin: Negative for rash.  Neurological: Negative for headaches.  Hematological: Does not bruise/bleed easily.  Psychiatric/Behavioral: Positive for confusion.     Physical Exam Updated Vital Signs BP 136/90   Pulse (!) 112   Temp 97.9 F (36.6 C) (Oral)   Resp 18   Ht 5' (1.524 m)   Wt 49.9 kg   SpO2 95%   BMI 21.48 kg/m   Physical Exam  Constitutional: She appears well-developed and well-nourished. No distress.  HENT:  Head: Atraumatic.  Eyes: Conjunctivae are  normal. Pupils are equal, round, and reactive to light. No scleral icterus.  Neck: Neck supple. No tracheal deviation present. No thyromegaly present.  No bruits  Cardiovascular: Normal rate, regular rhythm, normal heart sounds and intact distal pulses.   Pulmonary/Chest: Effort normal and breath sounds normal. No respiratory distress.  Abdominal: Soft. Normal appearance and bowel sounds are normal. She exhibits no distension. There is no tenderness.  Genitourinary:  Genitourinary Comments: No cva tenderness  Musculoskeletal: She exhibits no edema.  Neurological: She is alert.  Alert to person, place but not day/date/year. Ambulates w steady gait. Motor intact bil.   Skin: Skin is warm and dry. No rash noted. She is not diaphoretic.  Psychiatric: She has a normal mood and affect.  Calm alert. Denies thoughts of harm to self or others.   Nursing note and vitals reviewed.    ED Treatments / Results  Labs (all labs ordered are listed, but only abnormal results are displayed) Results for orders placed or performed during the hospital encounter of 06/29/16  CBC  Result Value Ref Range   WBC 7.7 4.0 - 10.5 K/uL   RBC 3.98 3.87 -  5.11 MIL/uL   Hemoglobin 11.6 (L) 12.0 - 15.0 g/dL   HCT 34.5 (L) 36.0 - 46.0 %   MCV 86.7 78.0 - 100.0 fL   MCH 29.1 26.0 - 34.0 pg   MCHC 33.6 30.0 - 36.0 g/dL   RDW 13.1 11.5 - 15.5 %   Platelets 444 (H) 150 - 400 K/uL  Comprehensive metabolic panel  Result Value Ref Range   Sodium 136 135 - 145 mmol/L   Potassium 3.2 (L) 3.5 - 5.1 mmol/L   Chloride 100 (L) 101 - 111 mmol/L   CO2 28 22 - 32 mmol/L   Glucose, Bld 143 (H) 65 - 99 mg/dL   BUN 7 6 - 20 mg/dL   Creatinine, Ser 1.13 (H) 0.44 - 1.00 mg/dL   Calcium 9.6 8.9 - 10.3 mg/dL   Total Protein 7.8 6.5 - 8.1 g/dL   Albumin 4.1 3.5 - 5.0 g/dL   AST 22 15 - 41 U/L   ALT 11 (L) 14 - 54 U/L   Alkaline Phosphatase 125 38 - 126 U/L   Total Bilirubin 0.3 0.3 - 1.2 mg/dL   GFR calc non Af Amer 47 (L) >60 mL/min   GFR calc Af Amer 55 (L) >60 mL/min   Anion gap 8 5 - 15   Ct Head Wo Contrast  Result Date: 06/29/2016 CLINICAL DATA:  Altered mental status, combative EXAM: CT HEAD WITHOUT CONTRAST TECHNIQUE: Contiguous axial images were obtained from the base of the skull through the vertex without intravenous contrast. COMPARISON:  04/26/2016 FINDINGS: Brain: No intracranial hemorrhage, mass effect or midline shift. Stable atrophy and chronic periventricular and subcortical white matter disease. Stable old left parietal posterior infarct. No definite acute cortical infarction. No mass lesion is noted on this unenhanced scan. Vascular: Atherosclerotic calcifications of carotid siphon again noted. Skull: No skull fracture.  No bony lesions are identified. Sinuses/Orbits: No acute finding Other: None IMPRESSION: No acute intracranial abnormality. Stable atrophy and chronic white matter disease. Stable old left posterior parietal infarct. Electronically Signed   By: Lahoma Crocker M.D.   On: 06/29/2016 16:35    EKG  EKG Interpretation None       Radiology Ct Head Wo Contrast  Result Date: 06/29/2016 CLINICAL DATA:  Altered mental status,  combative EXAM: CT HEAD WITHOUT CONTRAST TECHNIQUE: Contiguous axial images were  obtained from the base of the skull through the vertex without intravenous contrast. COMPARISON:  04/26/2016 FINDINGS: Brain: No intracranial hemorrhage, mass effect or midline shift. Stable atrophy and chronic periventricular and subcortical white matter disease. Stable old left parietal posterior infarct. No definite acute cortical infarction. No mass lesion is noted on this unenhanced scan. Vascular: Atherosclerotic calcifications of carotid siphon again noted. Skull: No skull fracture.  No bony lesions are identified. Sinuses/Orbits: No acute finding Other: None IMPRESSION: No acute intracranial abnormality. Stable atrophy and chronic white matter disease. Stable old left posterior parietal infarct. Electronically Signed   By: Lahoma Crocker M.D.   On: 06/29/2016 16:35    Procedures Procedures (including critical care time)  Medications Ordered in ED Medications - No data to display   Initial Impression / Assessment and Plan / ED Course  I have reviewed the triage vital signs and the nursing notes.  Pertinent labs & imaging results that were available during my care of the patient were reviewed by me and considered in my medical decision making (see chart for details).  Labs. CT as history [facility], and now altered mental status.   Reviewed nursing notes and prior charts for additional history.   [facility] team consulted.   k mildly low, kcl po.  [facility] eval pending.  Disposition per [facility] team.     Final Clinical Impressions(s) / ED Diagnoses   Final diagnoses:  None    New Prescriptions New Prescriptions   No medications on file     Lajean Saver, MD 06/29/16 1642

## 2016-06-30 ENCOUNTER — Emergency Department (HOSPITAL_COMMUNITY): Payer: Medicare HMO

## 2016-06-30 DIAGNOSIS — Z87891 Personal history of nicotine dependence: Secondary | ICD-10-CM

## 2016-06-30 DIAGNOSIS — F0150 Vascular dementia without behavioral disturbance: Secondary | ICD-10-CM | POA: Diagnosis not present

## 2016-06-30 DIAGNOSIS — Z79899 Other long term (current) drug therapy: Secondary | ICD-10-CM

## 2016-06-30 DIAGNOSIS — R45851 Suicidal ideations: Secondary | ICD-10-CM | POA: Diagnosis not present

## 2016-06-30 DIAGNOSIS — Z049 Encounter for examination and observation for unspecified reason: Secondary | ICD-10-CM | POA: Insufficient documentation

## 2016-06-30 DIAGNOSIS — R410 Disorientation, unspecified: Secondary | ICD-10-CM | POA: Diagnosis not present

## 2016-06-30 MED ORDER — MAGNESIUM OXIDE 400 (241.3 MG) MG PO TABS
400.0000 mg | ORAL_TABLET | Freq: Every day | ORAL | Status: DC
  Administered 2016-06-30 – 2016-07-01 (×2): 400 mg via ORAL
  Filled 2016-06-30 (×2): qty 1

## 2016-06-30 MED ORDER — B COMPLEX-C PO TABS
1.0000 | ORAL_TABLET | Freq: Every day | ORAL | Status: DC
  Administered 2016-06-30 – 2016-07-01 (×2): 1 via ORAL
  Filled 2016-06-30 (×2): qty 1

## 2016-06-30 MED ORDER — ASPIRIN EC 325 MG PO TBEC
325.0000 mg | DELAYED_RELEASE_TABLET | Freq: Once | ORAL | Status: AC
  Administered 2016-06-30: 325 mg via ORAL
  Filled 2016-06-30: qty 1

## 2016-06-30 MED ORDER — OXYBUTYNIN CHLORIDE ER 5 MG PO TB24
5.0000 mg | ORAL_TABLET | Freq: Every day | ORAL | Status: DC
  Administered 2016-06-30: 5 mg via ORAL
  Filled 2016-06-30: qty 1

## 2016-06-30 MED ORDER — LOSARTAN POTASSIUM 50 MG PO TABS
100.0000 mg | ORAL_TABLET | Freq: Every day | ORAL | Status: DC
  Administered 2016-06-30 – 2016-07-01 (×2): 100 mg via ORAL
  Filled 2016-06-30 (×2): qty 2

## 2016-06-30 MED ORDER — MAGNESIUM 500 MG PO TABS: 1.0000 | ORAL_TABLET | Freq: Every morning | ORAL | Status: DC

## 2016-06-30 MED ORDER — CLOPIDOGREL BISULFATE 75 MG PO TABS
75.0000 mg | ORAL_TABLET | Freq: Every day | ORAL | Status: DC
  Administered 2016-06-30 – 2016-07-01 (×2): 75 mg via ORAL
  Filled 2016-06-30 (×2): qty 1

## 2016-06-30 MED ORDER — CITALOPRAM HYDROBROMIDE 10 MG PO TABS
10.0000 mg | ORAL_TABLET | Freq: Every day | ORAL | Status: DC
  Administered 2016-06-30 – 2016-07-01 (×2): 10 mg via ORAL
  Filled 2016-06-30 (×2): qty 1

## 2016-06-30 NOTE — ED Notes (Signed)
Dub Mikes from [facility] phoned to inform that pt does have a bed there.  Pt's bed will be 416A.  Per Dub Mikes pt can be transported tomorrow.

## 2016-06-30 NOTE — Progress Notes (Signed)
CSW faxed patient's referral to: [facility], [facility], [facility], [place], [place].  Abundio Miu, [facility]  Clinical Social Worker [phone]

## 2016-06-30 NOTE — Consult Note (Signed)
[place] Psychiatry Consult   Reason for Consult:  Combative and suicide threat Referring Physician:  EDP Patient Identification: [name] MRN:  [id number] Principal Diagnosis: Vascular dementia without behavioral disturbance Diagnosis:   Patient Active Problem List   Diagnosis Date Noted  . Vascular dementia without behavioral disturbance [F01.50] 06/30/2016    Priority: High  . Aphasia [R47.01] 05/15/2016  . Atherosclerosis of left carotid artery [I65.22]   . Cerebral embolism with cerebral infarction [I63.40] 02/27/2016  . Acute encephalopathy [G93.40] 02/26/2016  . Osteoporosis [M81.0] 07/04/2015  . Impaired cognition [R41.89] 03/30/2014  . Vitamin D deficiency [E55.9] 08/18/2013  . Hyperlipidemia [E78.2] 08/18/2013  . Prediabetes [R73.03] 08/18/2013  . Medication management [Z79.899] 08/18/2013  . CKD (chronic kidney disease) stage 3, GFR 30-59 ml/min [N18.3] 07/01/2013  . Anemia of chronic disease [D63.8] 07/01/2013  . Depression, major, in remission ([name]) [F32.5] 06/30/2013  . Essential hypertension [I10] 02/09/2013  . COPD GOLD III [J44.9] 03/28/2012  . Vocal cord dysfunction [J38.3] 03/28/2012  . Carotid artery aneurysm ([place]) [I72.0] 03/04/2012  . Smoker [F17.200] 03/04/2012    Total Time spent with patient: 45 minutes  Subjective:   [name] is a 71 y.o. female patient admitted with change in behaviors after February stroke.  HPI:  71 yo female who presented to the ED from her nursing facility after becoming combative and threatening to hang herself, recent stroke with behavior changes.  Irritable on assessment but not agitated or aggressive.  Depressed with not wanting to be here, in this world.  No homicidal ideations, hallucinations, or alcohol/drug abuse.  Daughter feels she could definitely benefit from geropsychiatry.  Past Psychiatric History: dementia  Risk to Self: Suicidal Ideation: No Suicidal Intent: No Is patient at risk for suicide?:  No Suicidal Plan?: No Access to Means: No What has been your use of drugs/alcohol within the last 12 months?: NA How many times?: 0 Other Self Harm Risks: NA Triggers for Past Attempts:  (NA) Intentional Self Injurious Behavior: None Risk to Others: Homicidal Ideation: No Thoughts of Harm to Others: No Current Homicidal Intent: No Current Homicidal Plan: No Access to Homicidal Means: No Identified Victim: NA History of harm to others?: No Assessment of Violence: None Noted Violent Behavior Description: NA Does patient have access to weapons?: No Criminal Charges Pending?: No Does patient have a court date: No Prior Inpatient Therapy: Prior Inpatient Therapy: No Prior Therapy Dates: NA Prior Therapy Facilty/Provider(s): NA Reason for Treatment: NA Prior Outpatient Therapy: Prior Outpatient Therapy: No Prior Therapy Dates: NA Prior Therapy Facilty/Provider(s): NA Reason for Treatment: NA Does patient have an ACCT team?: No Does patient have Intensive In-House Services?  : No Does patient have [facility] services? : No Does patient have P4CC services?: No  Past Medical History:  Past Medical History:  Diagnosis Date  . Carotid artery aneurysm ([place]) 03/04/2012  . COPD (chronic obstructive pulmonary disease) ([place])   . Depression   . DJD (degenerative joint disease)   . GERD (gastroesophageal reflux disease)   . Hypertension   . Stroke [facility])    was on Plavix - not now    Past Surgical History:  Procedure Laterality Date  . CATARACT EXTRACTION Bilateral   . COLONOSCOPY  2004  . HERNIA REPAIR Right 2014   inguinal  . KYPHOPLASTY N/A 10/28/2014   Procedure: KYPHOPLASTY;  Surgeon: Phylliss Bob, MD;  Location: [facility];  Service: Orthopedics;  Laterality: N/A;  Lumbar 1 kyphoplasty  . MICROLARYNGOSCOPY WITH CO2 LASER AND EXCISION  OF VOCAL CORD LESION N/A 06/05/2013   Procedure: MICROLARYNGOSCOPY WITH CO2 LASER AND EXCISION OF VOCAL CORD POLYPS;  Surgeon: Melida Quitter, MD;   Location: [facility];  Service: ENT;  Laterality: N/A;  . TONSILLECTOMY    . TUBAL LIGATION  30 years  . VIDEO BRONCHOSCOPY  04/24/2012   Procedure: VIDEO BRONCHOSCOPY WITHOUT FLUORO;  Surgeon: Rigoberto Noel, MD;  Location: [place];  Service: Cardiopulmonary;  Laterality: Bilateral;   Family History:  Family History  Problem Relation Age of Onset  . Breast cancer Mother   . Breast cancer Sister    Family Psychiatric  History: unknown Social History:  History  Alcohol Use No     History  Drug Use No    Social History   Social History  . Marital status: Single    Spouse name: N/A  . Number of children: N/A  . Years of education: N/A   Occupational History  . retired Retired   Social History Main Topics  . Smoking status: Former Smoker    Packs/day: 0.25    Years: 30.00    Types: Cigarettes  . Smokeless tobacco: Never Used  . Alcohol use No  . Drug use: No  . Sexual activity: Not on file   Other Topics Concern  . Not on file   Social History Narrative  . No narrative on file   Additional Social History:    Allergies:   Allergies  Allergen Reactions  . Amoxapine And Related Nausea And Vomiting  . Amoxicillin     itching  . Ciprocinonide [Fluocinolone]     Pt unsure  . Ciprofloxacin     Pt unsure  . Doxycycline Nausea And Vomiting  . Erythromycin Hives  . Sulfa Antibiotics Other (See Comments)    Unknown-reaction as a child  . Zofran [Ondansetron Hcl] Nausea And Vomiting    Labs:  Results for orders placed or performed during the hospital encounter of 06/29/16 (from the past 48 hour(s))  Urinalysis, Routine w reflex microscopic     Status: Abnormal   Collection Time: 06/29/16  3:14 PM  Result Value Ref Range   Color, Urine YELLOW YELLOW   APPearance CLEAR CLEAR   Specific Gravity, Urine 1.003 (L) 1.005 - 1.030   pH 7.0 5.0 - 8.0   Glucose, UA NEGATIVE NEGATIVE mg/dL   Hgb urine dipstick NEGATIVE NEGATIVE   Bilirubin Urine NEGATIVE NEGATIVE    Ketones, ur NEGATIVE NEGATIVE mg/dL   Protein, ur NEGATIVE NEGATIVE mg/dL   Nitrite NEGATIVE NEGATIVE   Leukocytes, UA NEGATIVE NEGATIVE  CBC     Status: Abnormal   Collection Time: 06/29/16  3:35 PM  Result Value Ref Range   WBC 7.7 4.0 - 10.5 K/uL   RBC 3.98 3.87 - 5.11 MIL/uL   Hemoglobin 11.6 (L) 12.0 - 15.0 g/dL   HCT 34.5 (L) 36.0 - 46.0 %   MCV 86.7 78.0 - 100.0 fL   MCH 29.1 26.0 - 34.0 pg   MCHC 33.6 30.0 - 36.0 g/dL   RDW 13.1 11.5 - 15.5 %   Platelets 444 (H) 150 - 400 K/uL  Comprehensive metabolic panel     Status: Abnormal   Collection Time: 06/29/16  3:35 PM  Result Value Ref Range   Sodium 136 135 - 145 mmol/L   Potassium 3.2 (L) 3.5 - 5.1 mmol/L   Chloride 100 (L) 101 - 111 mmol/L   CO2 28 22 - 32 mmol/L   Glucose, Bld 143 (H) 65 -  99 mg/dL   BUN 7 6 - 20 mg/dL   Creatinine, Ser 1.13 (H) 0.44 - 1.00 mg/dL   Calcium 9.6 8.9 - 10.3 mg/dL   Total Protein 7.8 6.5 - 8.1 g/dL   Albumin 4.1 3.5 - 5.0 g/dL   AST 22 15 - 41 U/L   ALT 11 (L) 14 - 54 U/L   Alkaline Phosphatase 125 38 - 126 U/L   Total Bilirubin 0.3 0.3 - 1.2 mg/dL   GFR calc non Af Amer 47 (L) >60 mL/min   GFR calc Af Amer 55 (L) >60 mL/min    Comment: (NOTE) The eGFR has been calculated using the CKD EPI equation. This calculation has not been validated in all clinical situations. eGFR's persistently <60 mL/min signify possible Chronic Kidney Disease.    Anion gap 8 5 - 15    Current Facility-Administered Medications  Medication Dose Route Frequency Provider Last Rate Last Dose  . aspirin EC tablet 325 mg  325 mg Oral Once Patrecia Pour, NP      . atorvastatin (LIPITOR) tablet 20 mg  20 mg Oral q1800 Lajean Saver, MD      . B-complex with vitamin C tablet 1 tablet  1 tablet Oral Daily Patrecia Pour, NP      . clopidogrel (PLAVIX) tablet 75 mg  75 mg Oral Daily Patrecia Pour, NP      . gabapentin (NEURONTIN) capsule 300 mg  300 mg Oral QHS Lajean Saver, MD      . losartan (COZAAR) tablet 100  mg  100 mg Oral Daily Patrecia Pour, NP      . magnesium oxide (MAG-OX) tablet 400 mg  400 mg Oral Daily Patrecia Pour, NP      . oxybutynin (DITROPAN-XL) 24 hr tablet 5 mg  5 mg Oral QHS Patrecia Pour, NP      . pantoprazole (PROTONIX) EC tablet 40 mg  40 mg Oral Daily Lajean Saver, MD   40 mg at 06/30/16 0924  . QUEtiapine (SEROQUEL) tablet 400 mg  400 mg Oral QHS Lajean Saver, MD   400 mg at 06/29/16 2117   Current Outpatient Prescriptions  Medication Sig Dispense Refill  . amLODipine (NORVASC) 5 MG tablet Take 5 mg by mouth daily.    Marland Kitchen atorvastatin (LIPITOR) 20 MG tablet Take 1 tablet (20 mg total) by mouth daily at 6 PM. 30 tablet 1  . b complex vitamins tablet Take 1 tablet by mouth daily.    . Cholecalciferol (VITAMIN D3) 5000 units TABS Take 5,000 Units by mouth daily.    Marland Kitchen docusate sodium (COLACE) 100 MG capsule Take 1 capsule (100 mg total) by mouth every 12 (twelve) hours. 60 capsule 0  . gabapentin (NEURONTIN) 300 MG capsule Take 300 mg by mouth at bedtime.    Marland Kitchen LORazepam (ATIVAN) 0.5 MG tablet Take 1 tablet (0.5 mg total) by mouth 2 (two) times daily as needed for anxiety. 30 tablet 0  . LORazepam (ATIVAN) 2 MG/ML injection Inject 1 mg into the muscle 2 (two) times daily as needed. For agitation    . losartan (COZAAR) 100 MG tablet Take 100 mg by mouth daily.    . Magnesium 500 MG TABS Take 500 mg tablet twice daily with meals. 100 tablet 11  . mirtazapine (REMERON) 30 MG tablet Take 30 mg by mouth at bedtime.    Marland Kitchen oxybutynin (DITROPAN) 5 MG tablet TAKE 1/2 TO 1 TABLET BY MOUTH THREE TIMES DAILY FOR  BLADDER CONTROL (Patient taking differently: TAKE 1 TABLET BY MOUTH THREE TIMES DAILY FOR BLADDER CONTROL) 270 tablet 0  . pantoprazole (PROTONIX) 40 MG tablet Take 1 tablet (40 mg total) by mouth daily. 30 tablet 1  . QUEtiapine (SEROQUEL) 300 MG tablet Take 300 mg by mouth 2 (two) times daily.    . QUEtiapine (SEROQUEL) 400 MG tablet TAKE 1 TABLET BY MOUTH EVERY NIGHT AT BEDTIME 90  tablet 1  . traMADol (ULTRAM) 50 MG tablet Take 1 tablet (50 mg total) by mouth 2 (two) times daily as needed. (Patient taking differently: Take 50 mg by mouth every 6 (six) hours as needed (for pain). ) 60 tablet 0  . vitamin C (ASCORBIC ACID) 500 MG tablet Take 500 mg by mouth daily.    Marland Kitchen aspirin 325 MG tablet Take 1 tablet (325 mg total) by mouth daily. (Patient not taking: Reported on 06/29/2016) 30 tablet 2  . gabapentin (NEURONTIN) 600 MG tablet TAKE 1/2 TO 1 TABLET BY MOUTH THREE TIMES DAILY AS DIRECTED (Patient not taking: Reported on 06/29/2016) 270 tablet 1  . HYDROcodone-acetaminophen (NORCO/VICODIN) 5-325 MG tablet Take 1-2 tablets by mouth every 6 (six) hours as needed. (Patient not taking: Reported on 06/29/2016) 15 tablet 0  . ranitidine (ZANTAC) 300 MG tablet TAKE 1 TABLET(300 MG) BY MOUTH TWICE DAILY (Patient not taking: Reported on 06/29/2016) 180 tablet 0    Musculoskeletal: Strength & Muscle Tone: within normal limits Gait & Station: normal Patient leans: N/A  Psychiatric Specialty Exam: Physical Exam  Constitutional: She is oriented to person, place, and time. She appears well-developed.  HENT:  Head: Normocephalic.  Neck: Normal range of motion.  Respiratory: Effort normal.  Musculoskeletal: Normal range of motion.  Neurological: She is alert and oriented to person, place, and time.  Psychiatric: Her speech is normal. Judgment normal. She is aggressive. Cognition and memory are impaired. She exhibits a depressed mood. She expresses suicidal ideation. She expresses suicidal plans.    Review of Systems  Psychiatric/Behavioral: Positive for depression.  All other systems reviewed and are negative.   Blood pressure (!) 145/87, pulse 91, temperature 98.3 F (36.8 C), temperature source Oral, resp. rate 16, height 5' (1.524 m), weight 49.9 kg (110 lb), SpO2 92 %.Body mass index is 21.48 kg/m.  General Appearance: Casual  Eye Contact:  Fair  Speech:  Normal Rate  Volume:   Normal  Mood:  Irritable  Affect:  Congruent  Thought Process:  Coherent and Descriptions of Associations: Intact  Orientation:  Full (Time, Place, and Person)  Thought Content:  Rumination  Suicidal Thoughts:  Yes.  with intent/plan  Homicidal Thoughts:  No  Memory:  Immediate;   Fair Recent;   Fair Remote;   Fair  Judgement:  Impaired  Insight:  Fair  Psychomotor Activity:  Decreased  Concentration:  Concentration: Fair and Attention Span: Fair  Recall:  [employer] of Knowledge:  Fair  Language:  Good  Akathisia:  No  Handed:  Right  AIMS (if indicated):     Assets:  Housing Leisure Time Resilience Social Support  ADL's:  Intact  Cognition:  WNL  Sleep:        Treatment Plan Summary: Daily contact with patient to assess and evaluate symptoms and progress in treatment, Medication management and Plan vascular dementia with behavioral changes:  -Crisis stabilization -Medication management:  Medical medications restarted based on her discharge at neurology on 05/15/16 along with Ativan 0.5 mg BID for anxiety and Seroquel 400 mg  at bedside for mood stabilization.  Start Celexa 10 mg daily for depression -Individual counseling  Disposition: Recommend psychiatric Inpatient admission when medically cleared.  Waylan Boga, NP 06/30/2016 12:12 PM  Patient seen face-to-face for psychiatric evaluation, chart reviewed and case discussed with the physician extender and developed treatment plan. Reviewed the information documented and agree with the treatment plan. Corena Pilgrim, MD

## 2016-06-30 NOTE — Progress Notes (Signed)
CSW contacted patient's daughter [name] [phone]) to obtain collateral information. Patient's daughter reported that patient has seen a psychiatrist in the past and is unaware about patient's mental health diagnosis. She reports that patient took a bottle of pills in 2000 or 2001 in a suicide attempt. Patient's daughter reports that patient has been prescribed Lorazepam and is unaware about any other medications.   She reports that patient is not usually combative but anyone that interferes with patient trying to go home becomes patient's enemy. Patient's daughter reports that patient had her last stroke in December of 2017 and moved out of her home into a facility, patient wants to return home. She reports that patient's memory has declined gradually and it has impacted her day to day living. Patient's daughter reported that patient lost her sister in 2011 and that patient "has gone downhill since".   CSW informed psychiatrist and NP of collateral information obtained.

## 2016-07-01 DIAGNOSIS — R7303 Prediabetes: Secondary | ICD-10-CM | POA: Diagnosis not present

## 2016-07-01 DIAGNOSIS — F0281 Dementia in other diseases classified elsewhere with behavioral disturbance: Secondary | ICD-10-CM | POA: Diagnosis not present

## 2016-07-01 DIAGNOSIS — Z87891 Personal history of nicotine dependence: Secondary | ICD-10-CM | POA: Diagnosis not present

## 2016-07-01 DIAGNOSIS — Z7982 Long term (current) use of aspirin: Secondary | ICD-10-CM | POA: Diagnosis not present

## 2016-07-01 DIAGNOSIS — F0150 Vascular dementia without behavioral disturbance: Secondary | ICD-10-CM | POA: Diagnosis not present

## 2016-07-01 DIAGNOSIS — Z7902 Long term (current) use of antithrombotics/antiplatelets: Secondary | ICD-10-CM | POA: Diagnosis not present

## 2016-07-01 DIAGNOSIS — F0391 Unspecified dementia with behavioral disturbance: Secondary | ICD-10-CM | POA: Diagnosis not present

## 2016-07-01 DIAGNOSIS — E876 Hypokalemia: Secondary | ICD-10-CM | POA: Diagnosis not present

## 2016-07-01 DIAGNOSIS — F333 Major depressive disorder, recurrent, severe with psychotic symptoms: Secondary | ICD-10-CM | POA: Diagnosis not present

## 2016-07-01 DIAGNOSIS — K219 Gastro-esophageal reflux disease without esophagitis: Secondary | ICD-10-CM | POA: Diagnosis not present

## 2016-07-01 DIAGNOSIS — I129 Hypertensive chronic kidney disease with stage 1 through stage 4 chronic kidney disease, or unspecified chronic kidney disease: Secondary | ICD-10-CM | POA: Diagnosis not present

## 2016-07-01 DIAGNOSIS — Z79899 Other long term (current) drug therapy: Secondary | ICD-10-CM | POA: Diagnosis not present

## 2016-07-01 DIAGNOSIS — J449 Chronic obstructive pulmonary disease, unspecified: Secondary | ICD-10-CM | POA: Diagnosis not present

## 2016-07-01 DIAGNOSIS — Z8673 Personal history of transient ischemic attack (TIA), and cerebral infarction without residual deficits: Secondary | ICD-10-CM | POA: Diagnosis not present

## 2016-07-01 DIAGNOSIS — N183 Chronic kidney disease, stage 3 (moderate): Secondary | ICD-10-CM | POA: Diagnosis not present

## 2016-07-01 DIAGNOSIS — E782 Mixed hyperlipidemia: Secondary | ICD-10-CM | POA: Diagnosis not present

## 2016-07-01 DIAGNOSIS — G309 Alzheimer's disease, unspecified: Secondary | ICD-10-CM | POA: Diagnosis not present

## 2016-07-01 DIAGNOSIS — E559 Vitamin D deficiency, unspecified: Secondary | ICD-10-CM | POA: Diagnosis not present

## 2016-07-01 DIAGNOSIS — I10 Essential (primary) hypertension: Secondary | ICD-10-CM | POA: Diagnosis not present

## 2016-07-01 DIAGNOSIS — E871 Hypo-osmolality and hyponatremia: Secondary | ICD-10-CM | POA: Diagnosis not present

## 2016-07-01 NOTE — Progress Notes (Signed)
CSW informed patient's daughter about patient's transport to [facility].   Abundio Miu, [facility]  Clinical Social Worker [phone]

## 2016-07-01 NOTE — ED Notes (Signed)
Left a voicemail with the sheriff office. Reports has been called to [facility] . Pt is ready for transport.

## 2016-07-01 NOTE — ED Notes (Signed)
Pt ambulated in the hall with no assistance and steady gait .

## 2016-07-01 NOTE — Progress Notes (Signed)
Patient accepted to [facility] by Dr. Geanie Kenning. Patient's RN can call report to [phone]. Patient can arrive anytime today. NP notified, Patient's RN aware.  [name], [facility]  Clinical Social Worker [phone]

## 2016-07-05 ENCOUNTER — Encounter: Payer: Self-pay | Admitting: Internal Medicine

## 2016-07-16 DIAGNOSIS — R739 Hyperglycemia, unspecified: Secondary | ICD-10-CM | POA: Diagnosis not present

## 2016-07-16 DIAGNOSIS — I10 Essential (primary) hypertension: Secondary | ICD-10-CM | POA: Diagnosis not present

## 2016-07-16 DIAGNOSIS — F0281 Dementia in other diseases classified elsewhere with behavioral disturbance: Secondary | ICD-10-CM | POA: Diagnosis not present

## 2016-07-16 DIAGNOSIS — E559 Vitamin D deficiency, unspecified: Secondary | ICD-10-CM | POA: Diagnosis not present

## 2016-07-16 DIAGNOSIS — J441 Chronic obstructive pulmonary disease with (acute) exacerbation: Secondary | ICD-10-CM | POA: Diagnosis not present

## 2016-07-16 DIAGNOSIS — K219 Gastro-esophageal reflux disease without esophagitis: Secondary | ICD-10-CM | POA: Diagnosis not present

## 2016-07-23 ENCOUNTER — Telehealth: Payer: Self-pay

## 2016-07-23 NOTE — Telephone Encounter (Signed)
-----   Message from Rosalin Hawking, MD sent at 07/23/2016  9:41 AM EDT ----- Could you please let the patient know that the heart monitoring test done recently in our office was unremarkable. Please continue current treatment. Thanks.  Rosalin Hawking, MD PhD Stroke Neurology 07/23/2016 9:41 AM

## 2016-07-23 NOTE — Telephone Encounter (Signed)
Rn call [place] pts daughter on dpr form. Rn stated the heart monitor was unremarkable, continue treatment plan. Pts daughter verbalized understanding.

## 2016-07-24 DIAGNOSIS — M6281 Muscle weakness (generalized): Secondary | ICD-10-CM | POA: Diagnosis not present

## 2016-07-25 DIAGNOSIS — M6281 Muscle weakness (generalized): Secondary | ICD-10-CM | POA: Diagnosis not present

## 2016-07-30 DIAGNOSIS — F0391 Unspecified dementia with behavioral disturbance: Secondary | ICD-10-CM | POA: Diagnosis not present

## 2016-07-30 DIAGNOSIS — M6281 Muscle weakness (generalized): Secondary | ICD-10-CM | POA: Diagnosis not present

## 2016-07-30 DIAGNOSIS — Q845 Enlarged and hypertrophic nails: Secondary | ICD-10-CM | POA: Diagnosis not present

## 2016-07-30 DIAGNOSIS — I10 Essential (primary) hypertension: Secondary | ICD-10-CM | POA: Diagnosis not present

## 2016-07-31 DIAGNOSIS — M6281 Muscle weakness (generalized): Secondary | ICD-10-CM | POA: Diagnosis not present

## 2016-08-01 DIAGNOSIS — F0390 Unspecified dementia without behavioral disturbance: Secondary | ICD-10-CM | POA: Diagnosis not present

## 2016-08-02 DIAGNOSIS — F0390 Unspecified dementia without behavioral disturbance: Secondary | ICD-10-CM | POA: Diagnosis not present

## 2016-08-02 DIAGNOSIS — M6281 Muscle weakness (generalized): Secondary | ICD-10-CM | POA: Diagnosis not present

## 2016-08-03 DIAGNOSIS — F0390 Unspecified dementia without behavioral disturbance: Secondary | ICD-10-CM | POA: Diagnosis not present

## 2016-08-04 DIAGNOSIS — F0390 Unspecified dementia without behavioral disturbance: Secondary | ICD-10-CM | POA: Diagnosis not present

## 2016-08-05 DIAGNOSIS — F0390 Unspecified dementia without behavioral disturbance: Secondary | ICD-10-CM | POA: Diagnosis not present

## 2016-08-06 ENCOUNTER — Encounter: Payer: Self-pay | Admitting: Internal Medicine

## 2016-08-06 DIAGNOSIS — M79674 Pain in right toe(s): Secondary | ICD-10-CM | POA: Diagnosis not present

## 2016-08-06 DIAGNOSIS — M79675 Pain in left toe(s): Secondary | ICD-10-CM | POA: Diagnosis not present

## 2016-08-06 DIAGNOSIS — B351 Tinea unguium: Secondary | ICD-10-CM | POA: Diagnosis not present

## 2016-08-06 DIAGNOSIS — F0390 Unspecified dementia without behavioral disturbance: Secondary | ICD-10-CM | POA: Diagnosis not present

## 2016-08-07 DIAGNOSIS — F0390 Unspecified dementia without behavioral disturbance: Secondary | ICD-10-CM | POA: Diagnosis not present

## 2016-08-08 DIAGNOSIS — F0390 Unspecified dementia without behavioral disturbance: Secondary | ICD-10-CM | POA: Diagnosis not present

## 2016-08-09 DIAGNOSIS — F0390 Unspecified dementia without behavioral disturbance: Secondary | ICD-10-CM | POA: Diagnosis not present

## 2016-08-10 DIAGNOSIS — F0390 Unspecified dementia without behavioral disturbance: Secondary | ICD-10-CM | POA: Diagnosis not present

## 2016-08-10 DIAGNOSIS — M6281 Muscle weakness (generalized): Secondary | ICD-10-CM | POA: Diagnosis not present

## 2016-08-11 DIAGNOSIS — F0390 Unspecified dementia without behavioral disturbance: Secondary | ICD-10-CM | POA: Diagnosis not present

## 2016-08-12 DIAGNOSIS — F0390 Unspecified dementia without behavioral disturbance: Secondary | ICD-10-CM | POA: Diagnosis not present

## 2016-08-13 DIAGNOSIS — F0390 Unspecified dementia without behavioral disturbance: Secondary | ICD-10-CM | POA: Diagnosis not present

## 2016-08-14 DIAGNOSIS — F0390 Unspecified dementia without behavioral disturbance: Secondary | ICD-10-CM | POA: Diagnosis not present

## 2016-08-15 DIAGNOSIS — F0390 Unspecified dementia without behavioral disturbance: Secondary | ICD-10-CM | POA: Diagnosis not present

## 2016-08-15 DIAGNOSIS — M6281 Muscle weakness (generalized): Secondary | ICD-10-CM | POA: Diagnosis not present

## 2016-08-16 DIAGNOSIS — F0390 Unspecified dementia without behavioral disturbance: Secondary | ICD-10-CM | POA: Diagnosis not present

## 2016-08-17 DIAGNOSIS — M6281 Muscle weakness (generalized): Secondary | ICD-10-CM | POA: Diagnosis not present

## 2016-08-17 DIAGNOSIS — F0390 Unspecified dementia without behavioral disturbance: Secondary | ICD-10-CM | POA: Diagnosis not present

## 2016-08-18 DIAGNOSIS — F0390 Unspecified dementia without behavioral disturbance: Secondary | ICD-10-CM | POA: Diagnosis not present

## 2016-08-19 DIAGNOSIS — F0390 Unspecified dementia without behavioral disturbance: Secondary | ICD-10-CM | POA: Diagnosis not present

## 2016-08-20 DIAGNOSIS — F0390 Unspecified dementia without behavioral disturbance: Secondary | ICD-10-CM | POA: Diagnosis not present

## 2016-08-21 DIAGNOSIS — F0390 Unspecified dementia without behavioral disturbance: Secondary | ICD-10-CM | POA: Diagnosis not present

## 2016-08-21 DIAGNOSIS — F0391 Unspecified dementia with behavioral disturbance: Secondary | ICD-10-CM | POA: Diagnosis not present

## 2016-08-21 DIAGNOSIS — M6281 Muscle weakness (generalized): Secondary | ICD-10-CM | POA: Diagnosis not present

## 2016-08-21 DIAGNOSIS — D2370 Other benign neoplasm of skin of unspecified lower limb, including hip: Secondary | ICD-10-CM | POA: Diagnosis not present

## 2016-08-22 DIAGNOSIS — F0390 Unspecified dementia without behavioral disturbance: Secondary | ICD-10-CM | POA: Diagnosis not present

## 2016-08-23 DIAGNOSIS — F0390 Unspecified dementia without behavioral disturbance: Secondary | ICD-10-CM | POA: Diagnosis not present

## 2016-08-24 DIAGNOSIS — F0390 Unspecified dementia without behavioral disturbance: Secondary | ICD-10-CM | POA: Diagnosis not present

## 2016-08-25 DIAGNOSIS — F0390 Unspecified dementia without behavioral disturbance: Secondary | ICD-10-CM | POA: Diagnosis not present

## 2016-08-26 DIAGNOSIS — F0390 Unspecified dementia without behavioral disturbance: Secondary | ICD-10-CM | POA: Diagnosis not present

## 2016-08-27 DIAGNOSIS — F0390 Unspecified dementia without behavioral disturbance: Secondary | ICD-10-CM | POA: Diagnosis not present

## 2016-08-28 DIAGNOSIS — F0390 Unspecified dementia without behavioral disturbance: Secondary | ICD-10-CM | POA: Diagnosis not present

## 2016-08-29 DIAGNOSIS — F0390 Unspecified dementia without behavioral disturbance: Secondary | ICD-10-CM | POA: Diagnosis not present

## 2016-08-30 DIAGNOSIS — F0390 Unspecified dementia without behavioral disturbance: Secondary | ICD-10-CM | POA: Diagnosis not present

## 2016-08-31 DIAGNOSIS — F0390 Unspecified dementia without behavioral disturbance: Secondary | ICD-10-CM | POA: Diagnosis not present

## 2016-09-01 DIAGNOSIS — F0390 Unspecified dementia without behavioral disturbance: Secondary | ICD-10-CM | POA: Diagnosis not present

## 2016-09-02 DIAGNOSIS — F0390 Unspecified dementia without behavioral disturbance: Secondary | ICD-10-CM | POA: Diagnosis not present

## 2016-09-03 DIAGNOSIS — F0390 Unspecified dementia without behavioral disturbance: Secondary | ICD-10-CM | POA: Diagnosis not present

## 2016-09-04 DIAGNOSIS — F0390 Unspecified dementia without behavioral disturbance: Secondary | ICD-10-CM | POA: Diagnosis not present

## 2016-09-05 DIAGNOSIS — F0390 Unspecified dementia without behavioral disturbance: Secondary | ICD-10-CM | POA: Diagnosis not present

## 2016-09-06 DIAGNOSIS — F0390 Unspecified dementia without behavioral disturbance: Secondary | ICD-10-CM | POA: Diagnosis not present

## 2016-09-06 DIAGNOSIS — Z961 Presence of intraocular lens: Secondary | ICD-10-CM | POA: Diagnosis not present

## 2016-09-07 DIAGNOSIS — F0390 Unspecified dementia without behavioral disturbance: Secondary | ICD-10-CM | POA: Diagnosis not present

## 2016-09-08 DIAGNOSIS — F0390 Unspecified dementia without behavioral disturbance: Secondary | ICD-10-CM | POA: Diagnosis not present

## 2016-09-09 DIAGNOSIS — F0390 Unspecified dementia without behavioral disturbance: Secondary | ICD-10-CM | POA: Diagnosis not present

## 2016-09-10 DIAGNOSIS — F0390 Unspecified dementia without behavioral disturbance: Secondary | ICD-10-CM | POA: Diagnosis not present

## 2016-09-10 DIAGNOSIS — K591 Functional diarrhea: Secondary | ICD-10-CM | POA: Diagnosis not present

## 2016-09-10 DIAGNOSIS — F0391 Unspecified dementia with behavioral disturbance: Secondary | ICD-10-CM | POA: Diagnosis not present

## 2016-09-11 DIAGNOSIS — F0390 Unspecified dementia without behavioral disturbance: Secondary | ICD-10-CM | POA: Diagnosis not present

## 2016-09-12 DIAGNOSIS — F0390 Unspecified dementia without behavioral disturbance: Secondary | ICD-10-CM | POA: Diagnosis not present

## 2016-09-13 ENCOUNTER — Ambulatory Visit: Payer: Medicare HMO | Admitting: Nurse Practitioner

## 2016-09-13 DIAGNOSIS — F0390 Unspecified dementia without behavioral disturbance: Secondary | ICD-10-CM | POA: Diagnosis not present

## 2016-09-14 ENCOUNTER — Encounter: Payer: Self-pay | Admitting: Nurse Practitioner

## 2016-09-14 DIAGNOSIS — F0390 Unspecified dementia without behavioral disturbance: Secondary | ICD-10-CM | POA: Diagnosis not present

## 2016-09-15 DIAGNOSIS — F0390 Unspecified dementia without behavioral disturbance: Secondary | ICD-10-CM | POA: Diagnosis not present

## 2016-09-16 DIAGNOSIS — F0390 Unspecified dementia without behavioral disturbance: Secondary | ICD-10-CM | POA: Diagnosis not present

## 2016-09-17 DIAGNOSIS — F0390 Unspecified dementia without behavioral disturbance: Secondary | ICD-10-CM | POA: Diagnosis not present

## 2016-09-18 DIAGNOSIS — F0390 Unspecified dementia without behavioral disturbance: Secondary | ICD-10-CM | POA: Diagnosis not present

## 2016-09-19 DIAGNOSIS — F0390 Unspecified dementia without behavioral disturbance: Secondary | ICD-10-CM | POA: Diagnosis not present

## 2016-09-20 DIAGNOSIS — F0390 Unspecified dementia without behavioral disturbance: Secondary | ICD-10-CM | POA: Diagnosis not present

## 2016-09-20 DIAGNOSIS — M25561 Pain in right knee: Secondary | ICD-10-CM | POA: Diagnosis not present

## 2016-09-21 DIAGNOSIS — F0390 Unspecified dementia without behavioral disturbance: Secondary | ICD-10-CM | POA: Diagnosis not present

## 2016-09-22 DIAGNOSIS — F0390 Unspecified dementia without behavioral disturbance: Secondary | ICD-10-CM | POA: Diagnosis not present

## 2016-09-23 DIAGNOSIS — F0390 Unspecified dementia without behavioral disturbance: Secondary | ICD-10-CM | POA: Diagnosis not present

## 2016-09-24 DIAGNOSIS — F0390 Unspecified dementia without behavioral disturbance: Secondary | ICD-10-CM | POA: Diagnosis not present

## 2016-09-25 DIAGNOSIS — F0390 Unspecified dementia without behavioral disturbance: Secondary | ICD-10-CM | POA: Diagnosis not present

## 2016-09-26 DIAGNOSIS — F0390 Unspecified dementia without behavioral disturbance: Secondary | ICD-10-CM | POA: Diagnosis not present

## 2016-09-27 DIAGNOSIS — F0390 Unspecified dementia without behavioral disturbance: Secondary | ICD-10-CM | POA: Diagnosis not present

## 2016-09-28 DIAGNOSIS — F0390 Unspecified dementia without behavioral disturbance: Secondary | ICD-10-CM | POA: Diagnosis not present

## 2016-09-29 DIAGNOSIS — F0390 Unspecified dementia without behavioral disturbance: Secondary | ICD-10-CM | POA: Diagnosis not present

## 2016-09-30 DIAGNOSIS — F0390 Unspecified dementia without behavioral disturbance: Secondary | ICD-10-CM | POA: Diagnosis not present

## 2016-10-01 DIAGNOSIS — F0390 Unspecified dementia without behavioral disturbance: Secondary | ICD-10-CM | POA: Diagnosis not present

## 2016-10-01 DIAGNOSIS — F0391 Unspecified dementia with behavioral disturbance: Secondary | ICD-10-CM | POA: Diagnosis not present

## 2016-10-01 DIAGNOSIS — R269 Unspecified abnormalities of gait and mobility: Secondary | ICD-10-CM | POA: Diagnosis not present

## 2016-10-01 DIAGNOSIS — M1711 Unilateral primary osteoarthritis, right knee: Secondary | ICD-10-CM | POA: Diagnosis not present

## 2016-10-02 DIAGNOSIS — F0390 Unspecified dementia without behavioral disturbance: Secondary | ICD-10-CM | POA: Diagnosis not present

## 2016-10-03 DIAGNOSIS — F0390 Unspecified dementia without behavioral disturbance: Secondary | ICD-10-CM | POA: Diagnosis not present

## 2016-10-04 DIAGNOSIS — F0390 Unspecified dementia without behavioral disturbance: Secondary | ICD-10-CM | POA: Diagnosis not present

## 2016-10-05 DIAGNOSIS — F0390 Unspecified dementia without behavioral disturbance: Secondary | ICD-10-CM | POA: Diagnosis not present

## 2016-10-06 DIAGNOSIS — F0390 Unspecified dementia without behavioral disturbance: Secondary | ICD-10-CM | POA: Diagnosis not present

## 2016-10-07 DIAGNOSIS — F0390 Unspecified dementia without behavioral disturbance: Secondary | ICD-10-CM | POA: Diagnosis not present

## 2016-10-08 DIAGNOSIS — F0390 Unspecified dementia without behavioral disturbance: Secondary | ICD-10-CM | POA: Diagnosis not present

## 2016-10-09 DIAGNOSIS — F0390 Unspecified dementia without behavioral disturbance: Secondary | ICD-10-CM | POA: Diagnosis not present

## 2016-10-10 DIAGNOSIS — F0390 Unspecified dementia without behavioral disturbance: Secondary | ICD-10-CM | POA: Diagnosis not present

## 2016-10-11 DIAGNOSIS — R278 Other lack of coordination: Secondary | ICD-10-CM | POA: Diagnosis not present

## 2016-10-11 DIAGNOSIS — R296 Repeated falls: Secondary | ICD-10-CM | POA: Diagnosis not present

## 2016-10-11 DIAGNOSIS — F0390 Unspecified dementia without behavioral disturbance: Secondary | ICD-10-CM | POA: Diagnosis not present

## 2016-10-11 DIAGNOSIS — M6281 Muscle weakness (generalized): Secondary | ICD-10-CM | POA: Diagnosis not present

## 2016-10-12 DIAGNOSIS — F0390 Unspecified dementia without behavioral disturbance: Secondary | ICD-10-CM | POA: Diagnosis not present

## 2016-10-13 DIAGNOSIS — F0390 Unspecified dementia without behavioral disturbance: Secondary | ICD-10-CM | POA: Diagnosis not present

## 2016-10-14 DIAGNOSIS — F0390 Unspecified dementia without behavioral disturbance: Secondary | ICD-10-CM | POA: Diagnosis not present

## 2016-10-15 DIAGNOSIS — F0390 Unspecified dementia without behavioral disturbance: Secondary | ICD-10-CM | POA: Diagnosis not present

## 2016-10-16 DIAGNOSIS — R278 Other lack of coordination: Secondary | ICD-10-CM | POA: Diagnosis not present

## 2016-10-16 DIAGNOSIS — R296 Repeated falls: Secondary | ICD-10-CM | POA: Diagnosis not present

## 2016-10-16 DIAGNOSIS — M6281 Muscle weakness (generalized): Secondary | ICD-10-CM | POA: Diagnosis not present

## 2016-10-16 DIAGNOSIS — F0390 Unspecified dementia without behavioral disturbance: Secondary | ICD-10-CM | POA: Diagnosis not present

## 2016-10-17 DIAGNOSIS — R278 Other lack of coordination: Secondary | ICD-10-CM | POA: Diagnosis not present

## 2016-10-17 DIAGNOSIS — M6281 Muscle weakness (generalized): Secondary | ICD-10-CM | POA: Diagnosis not present

## 2016-10-17 DIAGNOSIS — F0390 Unspecified dementia without behavioral disturbance: Secondary | ICD-10-CM | POA: Diagnosis not present

## 2016-10-17 DIAGNOSIS — R296 Repeated falls: Secondary | ICD-10-CM | POA: Diagnosis not present

## 2016-10-18 DIAGNOSIS — F0390 Unspecified dementia without behavioral disturbance: Secondary | ICD-10-CM | POA: Diagnosis not present

## 2016-10-18 DIAGNOSIS — J449 Chronic obstructive pulmonary disease, unspecified: Secondary | ICD-10-CM | POA: Diagnosis not present

## 2016-10-18 DIAGNOSIS — E784 Other hyperlipidemia: Secondary | ICD-10-CM | POA: Diagnosis not present

## 2016-10-18 DIAGNOSIS — R278 Other lack of coordination: Secondary | ICD-10-CM | POA: Diagnosis not present

## 2016-10-18 DIAGNOSIS — M6281 Muscle weakness (generalized): Secondary | ICD-10-CM | POA: Diagnosis not present

## 2016-10-18 DIAGNOSIS — F0391 Unspecified dementia with behavioral disturbance: Secondary | ICD-10-CM | POA: Diagnosis not present

## 2016-10-18 DIAGNOSIS — R2689 Other abnormalities of gait and mobility: Secondary | ICD-10-CM | POA: Diagnosis not present

## 2016-10-18 DIAGNOSIS — R296 Repeated falls: Secondary | ICD-10-CM | POA: Diagnosis not present

## 2016-10-18 DIAGNOSIS — M25561 Pain in right knee: Secondary | ICD-10-CM | POA: Diagnosis not present

## 2016-10-18 DIAGNOSIS — I10 Essential (primary) hypertension: Secondary | ICD-10-CM | POA: Diagnosis not present

## 2016-10-19 DIAGNOSIS — F0390 Unspecified dementia without behavioral disturbance: Secondary | ICD-10-CM | POA: Diagnosis not present

## 2016-10-20 DIAGNOSIS — F0390 Unspecified dementia without behavioral disturbance: Secondary | ICD-10-CM | POA: Diagnosis not present

## 2016-10-21 DIAGNOSIS — F0390 Unspecified dementia without behavioral disturbance: Secondary | ICD-10-CM | POA: Diagnosis not present

## 2016-10-22 DIAGNOSIS — F0390 Unspecified dementia without behavioral disturbance: Secondary | ICD-10-CM | POA: Diagnosis not present

## 2016-10-23 DIAGNOSIS — R296 Repeated falls: Secondary | ICD-10-CM | POA: Diagnosis not present

## 2016-10-23 DIAGNOSIS — F0390 Unspecified dementia without behavioral disturbance: Secondary | ICD-10-CM | POA: Diagnosis not present

## 2016-10-23 DIAGNOSIS — M6281 Muscle weakness (generalized): Secondary | ICD-10-CM | POA: Diagnosis not present

## 2016-10-23 DIAGNOSIS — R278 Other lack of coordination: Secondary | ICD-10-CM | POA: Diagnosis not present

## 2016-10-24 DIAGNOSIS — M25561 Pain in right knee: Secondary | ICD-10-CM | POA: Diagnosis not present

## 2016-10-24 DIAGNOSIS — R296 Repeated falls: Secondary | ICD-10-CM | POA: Diagnosis not present

## 2016-10-24 DIAGNOSIS — F0391 Unspecified dementia with behavioral disturbance: Secondary | ICD-10-CM | POA: Diagnosis not present

## 2016-10-24 DIAGNOSIS — F0390 Unspecified dementia without behavioral disturbance: Secondary | ICD-10-CM | POA: Diagnosis not present

## 2016-10-24 DIAGNOSIS — J449 Chronic obstructive pulmonary disease, unspecified: Secondary | ICD-10-CM | POA: Diagnosis not present

## 2016-10-24 DIAGNOSIS — E784 Other hyperlipidemia: Secondary | ICD-10-CM | POA: Diagnosis not present

## 2016-10-24 DIAGNOSIS — R2689 Other abnormalities of gait and mobility: Secondary | ICD-10-CM | POA: Diagnosis not present

## 2016-10-24 DIAGNOSIS — Z7902 Long term (current) use of antithrombotics/antiplatelets: Secondary | ICD-10-CM | POA: Diagnosis not present

## 2016-10-24 DIAGNOSIS — Z87891 Personal history of nicotine dependence: Secondary | ICD-10-CM | POA: Diagnosis not present

## 2016-10-24 DIAGNOSIS — I10 Essential (primary) hypertension: Secondary | ICD-10-CM | POA: Diagnosis not present

## 2016-10-25 DIAGNOSIS — F0390 Unspecified dementia without behavioral disturbance: Secondary | ICD-10-CM | POA: Diagnosis not present

## 2016-10-26 DIAGNOSIS — Z7902 Long term (current) use of antithrombotics/antiplatelets: Secondary | ICD-10-CM | POA: Diagnosis not present

## 2016-10-26 DIAGNOSIS — F0391 Unspecified dementia with behavioral disturbance: Secondary | ICD-10-CM | POA: Diagnosis not present

## 2016-10-26 DIAGNOSIS — J449 Chronic obstructive pulmonary disease, unspecified: Secondary | ICD-10-CM | POA: Diagnosis not present

## 2016-10-26 DIAGNOSIS — E784 Other hyperlipidemia: Secondary | ICD-10-CM | POA: Diagnosis not present

## 2016-10-26 DIAGNOSIS — I10 Essential (primary) hypertension: Secondary | ICD-10-CM | POA: Diagnosis not present

## 2016-10-26 DIAGNOSIS — F0390 Unspecified dementia without behavioral disturbance: Secondary | ICD-10-CM | POA: Diagnosis not present

## 2016-10-26 DIAGNOSIS — R2689 Other abnormalities of gait and mobility: Secondary | ICD-10-CM | POA: Diagnosis not present

## 2016-10-26 DIAGNOSIS — R296 Repeated falls: Secondary | ICD-10-CM | POA: Diagnosis not present

## 2016-10-26 DIAGNOSIS — M25561 Pain in right knee: Secondary | ICD-10-CM | POA: Diagnosis not present

## 2016-10-26 DIAGNOSIS — Z87891 Personal history of nicotine dependence: Secondary | ICD-10-CM | POA: Diagnosis not present

## 2016-10-27 DIAGNOSIS — F0390 Unspecified dementia without behavioral disturbance: Secondary | ICD-10-CM | POA: Diagnosis not present

## 2016-10-28 DIAGNOSIS — F0390 Unspecified dementia without behavioral disturbance: Secondary | ICD-10-CM | POA: Diagnosis not present

## 2016-10-29 DIAGNOSIS — R296 Repeated falls: Secondary | ICD-10-CM | POA: Diagnosis not present

## 2016-10-29 DIAGNOSIS — I10 Essential (primary) hypertension: Secondary | ICD-10-CM | POA: Diagnosis not present

## 2016-10-29 DIAGNOSIS — F0390 Unspecified dementia without behavioral disturbance: Secondary | ICD-10-CM | POA: Diagnosis not present

## 2016-10-29 DIAGNOSIS — Z7902 Long term (current) use of antithrombotics/antiplatelets: Secondary | ICD-10-CM | POA: Diagnosis not present

## 2016-10-29 DIAGNOSIS — E784 Other hyperlipidemia: Secondary | ICD-10-CM | POA: Diagnosis not present

## 2016-10-29 DIAGNOSIS — R2689 Other abnormalities of gait and mobility: Secondary | ICD-10-CM | POA: Diagnosis not present

## 2016-10-29 DIAGNOSIS — F0391 Unspecified dementia with behavioral disturbance: Secondary | ICD-10-CM | POA: Diagnosis not present

## 2016-10-29 DIAGNOSIS — Z87891 Personal history of nicotine dependence: Secondary | ICD-10-CM | POA: Diagnosis not present

## 2016-10-29 DIAGNOSIS — J449 Chronic obstructive pulmonary disease, unspecified: Secondary | ICD-10-CM | POA: Diagnosis not present

## 2016-10-29 DIAGNOSIS — M25561 Pain in right knee: Secondary | ICD-10-CM | POA: Diagnosis not present

## 2016-10-30 DIAGNOSIS — F0390 Unspecified dementia without behavioral disturbance: Secondary | ICD-10-CM | POA: Diagnosis not present

## 2016-10-31 DIAGNOSIS — F0390 Unspecified dementia without behavioral disturbance: Secondary | ICD-10-CM | POA: Diagnosis not present

## 2016-11-01 DIAGNOSIS — R296 Repeated falls: Secondary | ICD-10-CM | POA: Diagnosis not present

## 2016-11-01 DIAGNOSIS — I10 Essential (primary) hypertension: Secondary | ICD-10-CM | POA: Diagnosis not present

## 2016-11-01 DIAGNOSIS — F0390 Unspecified dementia without behavioral disturbance: Secondary | ICD-10-CM | POA: Diagnosis not present

## 2016-11-01 DIAGNOSIS — M25561 Pain in right knee: Secondary | ICD-10-CM | POA: Diagnosis not present

## 2016-11-01 DIAGNOSIS — Z87891 Personal history of nicotine dependence: Secondary | ICD-10-CM | POA: Diagnosis not present

## 2016-11-01 DIAGNOSIS — E784 Other hyperlipidemia: Secondary | ICD-10-CM | POA: Diagnosis not present

## 2016-11-01 DIAGNOSIS — F0391 Unspecified dementia with behavioral disturbance: Secondary | ICD-10-CM | POA: Diagnosis not present

## 2016-11-01 DIAGNOSIS — R2689 Other abnormalities of gait and mobility: Secondary | ICD-10-CM | POA: Diagnosis not present

## 2016-11-01 DIAGNOSIS — J449 Chronic obstructive pulmonary disease, unspecified: Secondary | ICD-10-CM | POA: Diagnosis not present

## 2016-11-01 DIAGNOSIS — Z7902 Long term (current) use of antithrombotics/antiplatelets: Secondary | ICD-10-CM | POA: Diagnosis not present

## 2016-11-02 DIAGNOSIS — F0390 Unspecified dementia without behavioral disturbance: Secondary | ICD-10-CM | POA: Diagnosis not present

## 2016-11-03 DIAGNOSIS — F0390 Unspecified dementia without behavioral disturbance: Secondary | ICD-10-CM | POA: Diagnosis not present

## 2016-11-04 DIAGNOSIS — F0390 Unspecified dementia without behavioral disturbance: Secondary | ICD-10-CM | POA: Diagnosis not present

## 2016-11-05 DIAGNOSIS — M79674 Pain in right toe(s): Secondary | ICD-10-CM | POA: Diagnosis not present

## 2016-11-05 DIAGNOSIS — F0390 Unspecified dementia without behavioral disturbance: Secondary | ICD-10-CM | POA: Diagnosis not present

## 2016-11-05 DIAGNOSIS — B351 Tinea unguium: Secondary | ICD-10-CM | POA: Diagnosis not present

## 2016-11-05 DIAGNOSIS — M79675 Pain in left toe(s): Secondary | ICD-10-CM | POA: Diagnosis not present

## 2016-11-06 DIAGNOSIS — F0391 Unspecified dementia with behavioral disturbance: Secondary | ICD-10-CM | POA: Diagnosis not present

## 2016-11-06 DIAGNOSIS — F0390 Unspecified dementia without behavioral disturbance: Secondary | ICD-10-CM | POA: Diagnosis not present

## 2016-11-06 DIAGNOSIS — Z7902 Long term (current) use of antithrombotics/antiplatelets: Secondary | ICD-10-CM | POA: Diagnosis not present

## 2016-11-06 DIAGNOSIS — M25561 Pain in right knee: Secondary | ICD-10-CM | POA: Diagnosis not present

## 2016-11-06 DIAGNOSIS — E784 Other hyperlipidemia: Secondary | ICD-10-CM | POA: Diagnosis not present

## 2016-11-06 DIAGNOSIS — J449 Chronic obstructive pulmonary disease, unspecified: Secondary | ICD-10-CM | POA: Diagnosis not present

## 2016-11-06 DIAGNOSIS — I10 Essential (primary) hypertension: Secondary | ICD-10-CM | POA: Diagnosis not present

## 2016-11-06 DIAGNOSIS — R296 Repeated falls: Secondary | ICD-10-CM | POA: Diagnosis not present

## 2016-11-06 DIAGNOSIS — R2689 Other abnormalities of gait and mobility: Secondary | ICD-10-CM | POA: Diagnosis not present

## 2016-11-06 DIAGNOSIS — Z87891 Personal history of nicotine dependence: Secondary | ICD-10-CM | POA: Diagnosis not present

## 2016-11-07 DIAGNOSIS — F0390 Unspecified dementia without behavioral disturbance: Secondary | ICD-10-CM | POA: Diagnosis not present

## 2016-11-08 DIAGNOSIS — R296 Repeated falls: Secondary | ICD-10-CM | POA: Diagnosis not present

## 2016-11-08 DIAGNOSIS — R2689 Other abnormalities of gait and mobility: Secondary | ICD-10-CM | POA: Diagnosis not present

## 2016-11-08 DIAGNOSIS — Z7902 Long term (current) use of antithrombotics/antiplatelets: Secondary | ICD-10-CM | POA: Diagnosis not present

## 2016-11-08 DIAGNOSIS — J449 Chronic obstructive pulmonary disease, unspecified: Secondary | ICD-10-CM | POA: Diagnosis not present

## 2016-11-08 DIAGNOSIS — F0391 Unspecified dementia with behavioral disturbance: Secondary | ICD-10-CM | POA: Diagnosis not present

## 2016-11-08 DIAGNOSIS — E784 Other hyperlipidemia: Secondary | ICD-10-CM | POA: Diagnosis not present

## 2016-11-08 DIAGNOSIS — I10 Essential (primary) hypertension: Secondary | ICD-10-CM | POA: Diagnosis not present

## 2016-11-08 DIAGNOSIS — M25561 Pain in right knee: Secondary | ICD-10-CM | POA: Diagnosis not present

## 2016-11-08 DIAGNOSIS — Z87891 Personal history of nicotine dependence: Secondary | ICD-10-CM | POA: Diagnosis not present

## 2016-11-08 DIAGNOSIS — F0390 Unspecified dementia without behavioral disturbance: Secondary | ICD-10-CM | POA: Diagnosis not present

## 2016-11-09 DIAGNOSIS — F0390 Unspecified dementia without behavioral disturbance: Secondary | ICD-10-CM | POA: Diagnosis not present

## 2016-11-10 DIAGNOSIS — F0390 Unspecified dementia without behavioral disturbance: Secondary | ICD-10-CM | POA: Diagnosis not present

## 2016-11-11 DIAGNOSIS — F0390 Unspecified dementia without behavioral disturbance: Secondary | ICD-10-CM | POA: Diagnosis not present

## 2016-11-12 DIAGNOSIS — F0390 Unspecified dementia without behavioral disturbance: Secondary | ICD-10-CM | POA: Diagnosis not present

## 2016-11-13 DIAGNOSIS — F0391 Unspecified dementia with behavioral disturbance: Secondary | ICD-10-CM | POA: Diagnosis not present

## 2016-11-13 DIAGNOSIS — Z87891 Personal history of nicotine dependence: Secondary | ICD-10-CM | POA: Diagnosis not present

## 2016-11-13 DIAGNOSIS — E784 Other hyperlipidemia: Secondary | ICD-10-CM | POA: Diagnosis not present

## 2016-11-13 DIAGNOSIS — M25561 Pain in right knee: Secondary | ICD-10-CM | POA: Diagnosis not present

## 2016-11-13 DIAGNOSIS — J449 Chronic obstructive pulmonary disease, unspecified: Secondary | ICD-10-CM | POA: Diagnosis not present

## 2016-11-13 DIAGNOSIS — Z7902 Long term (current) use of antithrombotics/antiplatelets: Secondary | ICD-10-CM | POA: Diagnosis not present

## 2016-11-13 DIAGNOSIS — R2689 Other abnormalities of gait and mobility: Secondary | ICD-10-CM | POA: Diagnosis not present

## 2016-11-13 DIAGNOSIS — R296 Repeated falls: Secondary | ICD-10-CM | POA: Diagnosis not present

## 2016-11-13 DIAGNOSIS — I10 Essential (primary) hypertension: Secondary | ICD-10-CM | POA: Diagnosis not present

## 2016-11-13 DIAGNOSIS — F0390 Unspecified dementia without behavioral disturbance: Secondary | ICD-10-CM | POA: Diagnosis not present

## 2016-11-14 DIAGNOSIS — F0390 Unspecified dementia without behavioral disturbance: Secondary | ICD-10-CM | POA: Diagnosis not present

## 2016-11-15 DIAGNOSIS — E784 Other hyperlipidemia: Secondary | ICD-10-CM | POA: Diagnosis not present

## 2016-11-15 DIAGNOSIS — R296 Repeated falls: Secondary | ICD-10-CM | POA: Diagnosis not present

## 2016-11-15 DIAGNOSIS — Z7902 Long term (current) use of antithrombotics/antiplatelets: Secondary | ICD-10-CM | POA: Diagnosis not present

## 2016-11-15 DIAGNOSIS — R2689 Other abnormalities of gait and mobility: Secondary | ICD-10-CM | POA: Diagnosis not present

## 2016-11-15 DIAGNOSIS — F0391 Unspecified dementia with behavioral disturbance: Secondary | ICD-10-CM | POA: Diagnosis not present

## 2016-11-15 DIAGNOSIS — I10 Essential (primary) hypertension: Secondary | ICD-10-CM | POA: Diagnosis not present

## 2016-11-15 DIAGNOSIS — F0390 Unspecified dementia without behavioral disturbance: Secondary | ICD-10-CM | POA: Diagnosis not present

## 2016-11-15 DIAGNOSIS — Z87891 Personal history of nicotine dependence: Secondary | ICD-10-CM | POA: Diagnosis not present

## 2016-11-15 DIAGNOSIS — J449 Chronic obstructive pulmonary disease, unspecified: Secondary | ICD-10-CM | POA: Diagnosis not present

## 2016-11-15 DIAGNOSIS — M25561 Pain in right knee: Secondary | ICD-10-CM | POA: Diagnosis not present

## 2016-11-16 DIAGNOSIS — F0390 Unspecified dementia without behavioral disturbance: Secondary | ICD-10-CM | POA: Diagnosis not present

## 2016-11-17 DIAGNOSIS — F0390 Unspecified dementia without behavioral disturbance: Secondary | ICD-10-CM | POA: Diagnosis not present

## 2016-11-18 DIAGNOSIS — F0390 Unspecified dementia without behavioral disturbance: Secondary | ICD-10-CM | POA: Diagnosis not present

## 2016-11-19 DIAGNOSIS — F0390 Unspecified dementia without behavioral disturbance: Secondary | ICD-10-CM | POA: Diagnosis not present

## 2016-11-20 DIAGNOSIS — F0390 Unspecified dementia without behavioral disturbance: Secondary | ICD-10-CM | POA: Diagnosis not present

## 2016-11-21 DIAGNOSIS — F0390 Unspecified dementia without behavioral disturbance: Secondary | ICD-10-CM | POA: Diagnosis not present

## 2016-11-22 DIAGNOSIS — F0390 Unspecified dementia without behavioral disturbance: Secondary | ICD-10-CM | POA: Diagnosis not present

## 2016-11-23 DIAGNOSIS — F0390 Unspecified dementia without behavioral disturbance: Secondary | ICD-10-CM | POA: Diagnosis not present

## 2016-11-24 DIAGNOSIS — F0390 Unspecified dementia without behavioral disturbance: Secondary | ICD-10-CM | POA: Diagnosis not present

## 2016-11-25 DIAGNOSIS — F0390 Unspecified dementia without behavioral disturbance: Secondary | ICD-10-CM | POA: Diagnosis not present

## 2016-11-26 DIAGNOSIS — F0390 Unspecified dementia without behavioral disturbance: Secondary | ICD-10-CM | POA: Diagnosis not present

## 2016-11-27 DIAGNOSIS — F0390 Unspecified dementia without behavioral disturbance: Secondary | ICD-10-CM | POA: Diagnosis not present

## 2016-11-28 DIAGNOSIS — F0390 Unspecified dementia without behavioral disturbance: Secondary | ICD-10-CM | POA: Diagnosis not present

## 2016-11-29 DIAGNOSIS — F0390 Unspecified dementia without behavioral disturbance: Secondary | ICD-10-CM | POA: Diagnosis not present

## 2016-11-30 DIAGNOSIS — F0390 Unspecified dementia without behavioral disturbance: Secondary | ICD-10-CM | POA: Diagnosis not present

## 2016-12-01 DIAGNOSIS — F0390 Unspecified dementia without behavioral disturbance: Secondary | ICD-10-CM | POA: Diagnosis not present

## 2016-12-02 DIAGNOSIS — F0390 Unspecified dementia without behavioral disturbance: Secondary | ICD-10-CM | POA: Diagnosis not present

## 2016-12-03 DIAGNOSIS — F0390 Unspecified dementia without behavioral disturbance: Secondary | ICD-10-CM | POA: Diagnosis not present

## 2016-12-04 DIAGNOSIS — F0390 Unspecified dementia without behavioral disturbance: Secondary | ICD-10-CM | POA: Diagnosis not present

## 2016-12-05 DIAGNOSIS — F0390 Unspecified dementia without behavioral disturbance: Secondary | ICD-10-CM | POA: Diagnosis not present

## 2016-12-06 DIAGNOSIS — F0390 Unspecified dementia without behavioral disturbance: Secondary | ICD-10-CM | POA: Diagnosis not present

## 2016-12-07 DIAGNOSIS — F0390 Unspecified dementia without behavioral disturbance: Secondary | ICD-10-CM | POA: Diagnosis not present

## 2016-12-08 DIAGNOSIS — F0390 Unspecified dementia without behavioral disturbance: Secondary | ICD-10-CM | POA: Diagnosis not present

## 2016-12-09 DIAGNOSIS — F0390 Unspecified dementia without behavioral disturbance: Secondary | ICD-10-CM | POA: Diagnosis not present

## 2016-12-10 DIAGNOSIS — F0390 Unspecified dementia without behavioral disturbance: Secondary | ICD-10-CM | POA: Diagnosis not present

## 2016-12-11 DIAGNOSIS — F0390 Unspecified dementia without behavioral disturbance: Secondary | ICD-10-CM | POA: Diagnosis not present

## 2016-12-12 DIAGNOSIS — F0390 Unspecified dementia without behavioral disturbance: Secondary | ICD-10-CM | POA: Diagnosis not present

## 2016-12-13 DIAGNOSIS — F0390 Unspecified dementia without behavioral disturbance: Secondary | ICD-10-CM | POA: Diagnosis not present

## 2016-12-14 DIAGNOSIS — F0390 Unspecified dementia without behavioral disturbance: Secondary | ICD-10-CM | POA: Diagnosis not present

## 2016-12-15 DIAGNOSIS — F0390 Unspecified dementia without behavioral disturbance: Secondary | ICD-10-CM | POA: Diagnosis not present

## 2016-12-16 DIAGNOSIS — F0390 Unspecified dementia without behavioral disturbance: Secondary | ICD-10-CM | POA: Diagnosis not present

## 2016-12-17 DIAGNOSIS — E559 Vitamin D deficiency, unspecified: Secondary | ICD-10-CM | POA: Diagnosis not present

## 2016-12-17 DIAGNOSIS — F0390 Unspecified dementia without behavioral disturbance: Secondary | ICD-10-CM | POA: Diagnosis not present

## 2016-12-17 DIAGNOSIS — F0391 Unspecified dementia with behavioral disturbance: Secondary | ICD-10-CM | POA: Diagnosis not present

## 2016-12-17 DIAGNOSIS — E871 Hypo-osmolality and hyponatremia: Secondary | ICD-10-CM | POA: Diagnosis not present

## 2016-12-17 DIAGNOSIS — I10 Essential (primary) hypertension: Secondary | ICD-10-CM | POA: Diagnosis not present

## 2016-12-18 DIAGNOSIS — F0390 Unspecified dementia without behavioral disturbance: Secondary | ICD-10-CM | POA: Diagnosis not present

## 2016-12-19 DIAGNOSIS — D649 Anemia, unspecified: Secondary | ICD-10-CM | POA: Diagnosis not present

## 2016-12-19 DIAGNOSIS — E559 Vitamin D deficiency, unspecified: Secondary | ICD-10-CM | POA: Diagnosis not present

## 2016-12-19 DIAGNOSIS — F0391 Unspecified dementia with behavioral disturbance: Secondary | ICD-10-CM | POA: Diagnosis not present

## 2016-12-19 DIAGNOSIS — E039 Hypothyroidism, unspecified: Secondary | ICD-10-CM | POA: Diagnosis not present

## 2016-12-19 DIAGNOSIS — I10 Essential (primary) hypertension: Secondary | ICD-10-CM | POA: Diagnosis not present

## 2016-12-19 DIAGNOSIS — F0390 Unspecified dementia without behavioral disturbance: Secondary | ICD-10-CM | POA: Diagnosis not present

## 2016-12-20 DIAGNOSIS — F0390 Unspecified dementia without behavioral disturbance: Secondary | ICD-10-CM | POA: Diagnosis not present

## 2016-12-21 DIAGNOSIS — F0390 Unspecified dementia without behavioral disturbance: Secondary | ICD-10-CM | POA: Diagnosis not present

## 2016-12-22 DIAGNOSIS — F0390 Unspecified dementia without behavioral disturbance: Secondary | ICD-10-CM | POA: Diagnosis not present

## 2016-12-23 DIAGNOSIS — F0390 Unspecified dementia without behavioral disturbance: Secondary | ICD-10-CM | POA: Diagnosis not present

## 2016-12-24 DIAGNOSIS — F0390 Unspecified dementia without behavioral disturbance: Secondary | ICD-10-CM | POA: Diagnosis not present

## 2016-12-25 DIAGNOSIS — F0390 Unspecified dementia without behavioral disturbance: Secondary | ICD-10-CM | POA: Diagnosis not present

## 2016-12-26 DIAGNOSIS — F0390 Unspecified dementia without behavioral disturbance: Secondary | ICD-10-CM | POA: Diagnosis not present

## 2016-12-27 DIAGNOSIS — F0390 Unspecified dementia without behavioral disturbance: Secondary | ICD-10-CM | POA: Diagnosis not present

## 2016-12-28 DIAGNOSIS — F0390 Unspecified dementia without behavioral disturbance: Secondary | ICD-10-CM | POA: Diagnosis not present

## 2016-12-29 DIAGNOSIS — F0390 Unspecified dementia without behavioral disturbance: Secondary | ICD-10-CM | POA: Diagnosis not present

## 2016-12-30 DIAGNOSIS — F0390 Unspecified dementia without behavioral disturbance: Secondary | ICD-10-CM | POA: Diagnosis not present

## 2016-12-31 DIAGNOSIS — F0390 Unspecified dementia without behavioral disturbance: Secondary | ICD-10-CM | POA: Diagnosis not present

## 2017-01-01 DIAGNOSIS — F0390 Unspecified dementia without behavioral disturbance: Secondary | ICD-10-CM | POA: Diagnosis not present

## 2017-01-02 DIAGNOSIS — F0390 Unspecified dementia without behavioral disturbance: Secondary | ICD-10-CM | POA: Diagnosis not present

## 2017-01-03 DIAGNOSIS — F0390 Unspecified dementia without behavioral disturbance: Secondary | ICD-10-CM | POA: Diagnosis not present

## 2017-01-04 DIAGNOSIS — F0390 Unspecified dementia without behavioral disturbance: Secondary | ICD-10-CM | POA: Diagnosis not present

## 2017-01-05 DIAGNOSIS — F0390 Unspecified dementia without behavioral disturbance: Secondary | ICD-10-CM | POA: Diagnosis not present

## 2017-01-06 DIAGNOSIS — F0390 Unspecified dementia without behavioral disturbance: Secondary | ICD-10-CM | POA: Diagnosis not present

## 2017-01-07 DIAGNOSIS — F0390 Unspecified dementia without behavioral disturbance: Secondary | ICD-10-CM | POA: Diagnosis not present

## 2017-01-08 DIAGNOSIS — F0390 Unspecified dementia without behavioral disturbance: Secondary | ICD-10-CM | POA: Diagnosis not present

## 2017-01-09 DIAGNOSIS — F0390 Unspecified dementia without behavioral disturbance: Secondary | ICD-10-CM | POA: Diagnosis not present

## 2017-01-10 DIAGNOSIS — F0390 Unspecified dementia without behavioral disturbance: Secondary | ICD-10-CM | POA: Diagnosis not present

## 2017-01-11 DIAGNOSIS — F0390 Unspecified dementia without behavioral disturbance: Secondary | ICD-10-CM | POA: Diagnosis not present

## 2017-01-12 DIAGNOSIS — F0390 Unspecified dementia without behavioral disturbance: Secondary | ICD-10-CM | POA: Diagnosis not present

## 2017-01-13 DIAGNOSIS — F0390 Unspecified dementia without behavioral disturbance: Secondary | ICD-10-CM | POA: Diagnosis not present

## 2017-01-14 DIAGNOSIS — F0390 Unspecified dementia without behavioral disturbance: Secondary | ICD-10-CM | POA: Diagnosis not present

## 2017-01-15 DIAGNOSIS — F0390 Unspecified dementia without behavioral disturbance: Secondary | ICD-10-CM | POA: Diagnosis not present

## 2017-01-16 DIAGNOSIS — F0390 Unspecified dementia without behavioral disturbance: Secondary | ICD-10-CM | POA: Diagnosis not present

## 2017-01-17 DIAGNOSIS — F0390 Unspecified dementia without behavioral disturbance: Secondary | ICD-10-CM | POA: Diagnosis not present

## 2017-01-18 DIAGNOSIS — F0390 Unspecified dementia without behavioral disturbance: Secondary | ICD-10-CM | POA: Diagnosis not present

## 2017-01-19 DIAGNOSIS — F0390 Unspecified dementia without behavioral disturbance: Secondary | ICD-10-CM | POA: Diagnosis not present

## 2017-01-20 DIAGNOSIS — F0390 Unspecified dementia without behavioral disturbance: Secondary | ICD-10-CM | POA: Diagnosis not present

## 2017-01-21 DIAGNOSIS — F0390 Unspecified dementia without behavioral disturbance: Secondary | ICD-10-CM | POA: Diagnosis not present

## 2017-01-22 DIAGNOSIS — F0390 Unspecified dementia without behavioral disturbance: Secondary | ICD-10-CM | POA: Diagnosis not present

## 2017-01-23 DIAGNOSIS — F0390 Unspecified dementia without behavioral disturbance: Secondary | ICD-10-CM | POA: Diagnosis not present

## 2017-01-24 DIAGNOSIS — F0390 Unspecified dementia without behavioral disturbance: Secondary | ICD-10-CM | POA: Diagnosis not present

## 2017-01-25 DIAGNOSIS — F0390 Unspecified dementia without behavioral disturbance: Secondary | ICD-10-CM | POA: Diagnosis not present

## 2017-01-26 DIAGNOSIS — F0390 Unspecified dementia without behavioral disturbance: Secondary | ICD-10-CM | POA: Diagnosis not present

## 2017-01-27 DIAGNOSIS — F0390 Unspecified dementia without behavioral disturbance: Secondary | ICD-10-CM | POA: Diagnosis not present

## 2017-01-28 DIAGNOSIS — F0390 Unspecified dementia without behavioral disturbance: Secondary | ICD-10-CM | POA: Diagnosis not present

## 2017-01-29 DIAGNOSIS — F0390 Unspecified dementia without behavioral disturbance: Secondary | ICD-10-CM | POA: Diagnosis not present

## 2017-01-30 DIAGNOSIS — F0390 Unspecified dementia without behavioral disturbance: Secondary | ICD-10-CM | POA: Diagnosis not present

## 2017-01-31 DIAGNOSIS — F0390 Unspecified dementia without behavioral disturbance: Secondary | ICD-10-CM | POA: Diagnosis not present

## 2017-02-01 DIAGNOSIS — F0390 Unspecified dementia without behavioral disturbance: Secondary | ICD-10-CM | POA: Diagnosis not present

## 2017-02-02 DIAGNOSIS — F0390 Unspecified dementia without behavioral disturbance: Secondary | ICD-10-CM | POA: Diagnosis not present

## 2017-02-03 DIAGNOSIS — F0390 Unspecified dementia without behavioral disturbance: Secondary | ICD-10-CM | POA: Diagnosis not present

## 2017-02-04 DIAGNOSIS — F0390 Unspecified dementia without behavioral disturbance: Secondary | ICD-10-CM | POA: Diagnosis not present

## 2017-02-05 DIAGNOSIS — F0390 Unspecified dementia without behavioral disturbance: Secondary | ICD-10-CM | POA: Diagnosis not present

## 2017-02-06 DIAGNOSIS — F0390 Unspecified dementia without behavioral disturbance: Secondary | ICD-10-CM | POA: Diagnosis not present

## 2017-02-07 DIAGNOSIS — F0390 Unspecified dementia without behavioral disturbance: Secondary | ICD-10-CM | POA: Diagnosis not present

## 2017-02-08 DIAGNOSIS — F0390 Unspecified dementia without behavioral disturbance: Secondary | ICD-10-CM | POA: Diagnosis not present

## 2017-02-09 DIAGNOSIS — F0390 Unspecified dementia without behavioral disturbance: Secondary | ICD-10-CM | POA: Diagnosis not present

## 2017-02-10 DIAGNOSIS — F0390 Unspecified dementia without behavioral disturbance: Secondary | ICD-10-CM | POA: Diagnosis not present

## 2017-02-11 DIAGNOSIS — F0390 Unspecified dementia without behavioral disturbance: Secondary | ICD-10-CM | POA: Diagnosis not present

## 2017-02-12 DIAGNOSIS — F0390 Unspecified dementia without behavioral disturbance: Secondary | ICD-10-CM | POA: Diagnosis not present

## 2017-02-13 DIAGNOSIS — F0390 Unspecified dementia without behavioral disturbance: Secondary | ICD-10-CM | POA: Diagnosis not present

## 2017-02-14 DIAGNOSIS — F0390 Unspecified dementia without behavioral disturbance: Secondary | ICD-10-CM | POA: Diagnosis not present

## 2017-02-15 DIAGNOSIS — F0390 Unspecified dementia without behavioral disturbance: Secondary | ICD-10-CM | POA: Diagnosis not present

## 2017-02-16 DIAGNOSIS — F0390 Unspecified dementia without behavioral disturbance: Secondary | ICD-10-CM | POA: Diagnosis not present

## 2017-02-17 DIAGNOSIS — F0390 Unspecified dementia without behavioral disturbance: Secondary | ICD-10-CM | POA: Diagnosis not present

## 2017-02-18 DIAGNOSIS — F0390 Unspecified dementia without behavioral disturbance: Secondary | ICD-10-CM | POA: Diagnosis not present

## 2017-02-19 DIAGNOSIS — M79675 Pain in left toe(s): Secondary | ICD-10-CM | POA: Diagnosis not present

## 2017-02-19 DIAGNOSIS — B351 Tinea unguium: Secondary | ICD-10-CM | POA: Diagnosis not present

## 2017-02-19 DIAGNOSIS — F0390 Unspecified dementia without behavioral disturbance: Secondary | ICD-10-CM | POA: Diagnosis not present

## 2017-02-19 DIAGNOSIS — M79674 Pain in right toe(s): Secondary | ICD-10-CM | POA: Diagnosis not present

## 2017-02-20 DIAGNOSIS — F0390 Unspecified dementia without behavioral disturbance: Secondary | ICD-10-CM | POA: Diagnosis not present

## 2017-02-21 DIAGNOSIS — F0390 Unspecified dementia without behavioral disturbance: Secondary | ICD-10-CM | POA: Diagnosis not present

## 2017-02-22 DIAGNOSIS — F0390 Unspecified dementia without behavioral disturbance: Secondary | ICD-10-CM | POA: Diagnosis not present

## 2017-02-23 DIAGNOSIS — F0390 Unspecified dementia without behavioral disturbance: Secondary | ICD-10-CM | POA: Diagnosis not present

## 2017-02-24 DIAGNOSIS — F0390 Unspecified dementia without behavioral disturbance: Secondary | ICD-10-CM | POA: Diagnosis not present

## 2017-02-25 DIAGNOSIS — F0390 Unspecified dementia without behavioral disturbance: Secondary | ICD-10-CM | POA: Diagnosis not present

## 2017-02-26 DIAGNOSIS — F0390 Unspecified dementia without behavioral disturbance: Secondary | ICD-10-CM | POA: Diagnosis not present

## 2017-02-27 DIAGNOSIS — F0390 Unspecified dementia without behavioral disturbance: Secondary | ICD-10-CM | POA: Diagnosis not present

## 2017-02-28 DIAGNOSIS — F0390 Unspecified dementia without behavioral disturbance: Secondary | ICD-10-CM | POA: Diagnosis not present

## 2017-03-01 DIAGNOSIS — F0390 Unspecified dementia without behavioral disturbance: Secondary | ICD-10-CM | POA: Diagnosis not present

## 2017-03-02 DIAGNOSIS — F0390 Unspecified dementia without behavioral disturbance: Secondary | ICD-10-CM | POA: Diagnosis not present

## 2017-03-03 DIAGNOSIS — F0390 Unspecified dementia without behavioral disturbance: Secondary | ICD-10-CM | POA: Diagnosis not present

## 2017-03-04 DIAGNOSIS — F0390 Unspecified dementia without behavioral disturbance: Secondary | ICD-10-CM | POA: Diagnosis not present

## 2017-03-05 DIAGNOSIS — F0390 Unspecified dementia without behavioral disturbance: Secondary | ICD-10-CM | POA: Diagnosis not present

## 2017-03-06 DIAGNOSIS — F0390 Unspecified dementia without behavioral disturbance: Secondary | ICD-10-CM | POA: Diagnosis not present

## 2017-03-07 DIAGNOSIS — F0390 Unspecified dementia without behavioral disturbance: Secondary | ICD-10-CM | POA: Diagnosis not present

## 2017-03-08 DIAGNOSIS — F0390 Unspecified dementia without behavioral disturbance: Secondary | ICD-10-CM | POA: Diagnosis not present

## 2017-03-09 DIAGNOSIS — F0390 Unspecified dementia without behavioral disturbance: Secondary | ICD-10-CM | POA: Diagnosis not present

## 2017-03-10 DIAGNOSIS — F0390 Unspecified dementia without behavioral disturbance: Secondary | ICD-10-CM | POA: Diagnosis not present

## 2017-03-11 DIAGNOSIS — F0390 Unspecified dementia without behavioral disturbance: Secondary | ICD-10-CM | POA: Diagnosis not present

## 2017-03-12 DIAGNOSIS — F0390 Unspecified dementia without behavioral disturbance: Secondary | ICD-10-CM | POA: Diagnosis not present

## 2017-03-13 DIAGNOSIS — F0390 Unspecified dementia without behavioral disturbance: Secondary | ICD-10-CM | POA: Diagnosis not present

## 2017-03-14 DIAGNOSIS — F0390 Unspecified dementia without behavioral disturbance: Secondary | ICD-10-CM | POA: Diagnosis not present

## 2017-03-15 DIAGNOSIS — F0390 Unspecified dementia without behavioral disturbance: Secondary | ICD-10-CM | POA: Diagnosis not present

## 2017-03-16 DIAGNOSIS — F0390 Unspecified dementia without behavioral disturbance: Secondary | ICD-10-CM | POA: Diagnosis not present

## 2017-03-17 DIAGNOSIS — F0390 Unspecified dementia without behavioral disturbance: Secondary | ICD-10-CM | POA: Diagnosis not present

## 2017-03-18 DIAGNOSIS — F0390 Unspecified dementia without behavioral disturbance: Secondary | ICD-10-CM | POA: Diagnosis not present

## 2017-03-19 DIAGNOSIS — F0390 Unspecified dementia without behavioral disturbance: Secondary | ICD-10-CM | POA: Diagnosis not present

## 2017-03-20 DIAGNOSIS — F0390 Unspecified dementia without behavioral disturbance: Secondary | ICD-10-CM | POA: Diagnosis not present

## 2017-03-21 DIAGNOSIS — F0390 Unspecified dementia without behavioral disturbance: Secondary | ICD-10-CM | POA: Diagnosis not present

## 2017-03-22 DIAGNOSIS — F0390 Unspecified dementia without behavioral disturbance: Secondary | ICD-10-CM | POA: Diagnosis not present

## 2017-03-23 DIAGNOSIS — F0390 Unspecified dementia without behavioral disturbance: Secondary | ICD-10-CM | POA: Diagnosis not present

## 2017-03-24 DIAGNOSIS — F0390 Unspecified dementia without behavioral disturbance: Secondary | ICD-10-CM | POA: Diagnosis not present

## 2017-03-25 DIAGNOSIS — F0390 Unspecified dementia without behavioral disturbance: Secondary | ICD-10-CM | POA: Diagnosis not present

## 2017-03-26 DIAGNOSIS — F0390 Unspecified dementia without behavioral disturbance: Secondary | ICD-10-CM | POA: Diagnosis not present

## 2017-03-27 DIAGNOSIS — F0390 Unspecified dementia without behavioral disturbance: Secondary | ICD-10-CM | POA: Diagnosis not present

## 2017-03-28 DIAGNOSIS — F0390 Unspecified dementia without behavioral disturbance: Secondary | ICD-10-CM | POA: Diagnosis not present

## 2017-03-29 DIAGNOSIS — F0390 Unspecified dementia without behavioral disturbance: Secondary | ICD-10-CM | POA: Diagnosis not present

## 2017-03-30 DIAGNOSIS — F0390 Unspecified dementia without behavioral disturbance: Secondary | ICD-10-CM | POA: Diagnosis not present

## 2017-03-31 DIAGNOSIS — F0390 Unspecified dementia without behavioral disturbance: Secondary | ICD-10-CM | POA: Diagnosis not present

## 2017-04-01 DIAGNOSIS — F0390 Unspecified dementia without behavioral disturbance: Secondary | ICD-10-CM | POA: Diagnosis not present

## 2017-04-02 DIAGNOSIS — F0390 Unspecified dementia without behavioral disturbance: Secondary | ICD-10-CM | POA: Diagnosis not present

## 2017-04-03 DIAGNOSIS — F0390 Unspecified dementia without behavioral disturbance: Secondary | ICD-10-CM | POA: Diagnosis not present

## 2017-04-04 DIAGNOSIS — F0390 Unspecified dementia without behavioral disturbance: Secondary | ICD-10-CM | POA: Diagnosis not present

## 2017-04-05 DIAGNOSIS — F0390 Unspecified dementia without behavioral disturbance: Secondary | ICD-10-CM | POA: Diagnosis not present

## 2017-04-06 DIAGNOSIS — F0390 Unspecified dementia without behavioral disturbance: Secondary | ICD-10-CM | POA: Diagnosis not present

## 2017-04-07 DIAGNOSIS — F0390 Unspecified dementia without behavioral disturbance: Secondary | ICD-10-CM | POA: Diagnosis not present

## 2017-04-08 DIAGNOSIS — F0390 Unspecified dementia without behavioral disturbance: Secondary | ICD-10-CM | POA: Diagnosis not present

## 2017-04-09 DIAGNOSIS — F0390 Unspecified dementia without behavioral disturbance: Secondary | ICD-10-CM | POA: Diagnosis not present

## 2017-04-10 DIAGNOSIS — F0390 Unspecified dementia without behavioral disturbance: Secondary | ICD-10-CM | POA: Diagnosis not present

## 2017-04-11 DIAGNOSIS — F0390 Unspecified dementia without behavioral disturbance: Secondary | ICD-10-CM | POA: Diagnosis not present

## 2017-04-12 DIAGNOSIS — F0390 Unspecified dementia without behavioral disturbance: Secondary | ICD-10-CM | POA: Diagnosis not present

## 2017-04-13 DIAGNOSIS — F0390 Unspecified dementia without behavioral disturbance: Secondary | ICD-10-CM | POA: Diagnosis not present

## 2017-04-14 DIAGNOSIS — F0390 Unspecified dementia without behavioral disturbance: Secondary | ICD-10-CM | POA: Diagnosis not present

## 2017-04-15 DIAGNOSIS — F0390 Unspecified dementia without behavioral disturbance: Secondary | ICD-10-CM | POA: Diagnosis not present

## 2017-04-16 DIAGNOSIS — F0390 Unspecified dementia without behavioral disturbance: Secondary | ICD-10-CM | POA: Diagnosis not present

## 2017-04-17 DIAGNOSIS — F0390 Unspecified dementia without behavioral disturbance: Secondary | ICD-10-CM | POA: Diagnosis not present

## 2017-04-18 DIAGNOSIS — F0390 Unspecified dementia without behavioral disturbance: Secondary | ICD-10-CM | POA: Diagnosis not present

## 2017-04-19 DIAGNOSIS — F0390 Unspecified dementia without behavioral disturbance: Secondary | ICD-10-CM | POA: Diagnosis not present

## 2017-04-20 DIAGNOSIS — F0390 Unspecified dementia without behavioral disturbance: Secondary | ICD-10-CM | POA: Diagnosis not present

## 2017-04-21 DIAGNOSIS — F0390 Unspecified dementia without behavioral disturbance: Secondary | ICD-10-CM | POA: Diagnosis not present

## 2017-04-22 DIAGNOSIS — F0390 Unspecified dementia without behavioral disturbance: Secondary | ICD-10-CM | POA: Diagnosis not present

## 2017-04-23 DIAGNOSIS — F0390 Unspecified dementia without behavioral disturbance: Secondary | ICD-10-CM | POA: Diagnosis not present

## 2017-04-24 DIAGNOSIS — Z961 Presence of intraocular lens: Secondary | ICD-10-CM | POA: Diagnosis not present

## 2017-04-24 DIAGNOSIS — F0390 Unspecified dementia without behavioral disturbance: Secondary | ICD-10-CM | POA: Diagnosis not present

## 2017-04-25 DIAGNOSIS — F0390 Unspecified dementia without behavioral disturbance: Secondary | ICD-10-CM | POA: Diagnosis not present

## 2017-04-26 DIAGNOSIS — F0390 Unspecified dementia without behavioral disturbance: Secondary | ICD-10-CM | POA: Diagnosis not present

## 2017-04-27 DIAGNOSIS — F0390 Unspecified dementia without behavioral disturbance: Secondary | ICD-10-CM | POA: Diagnosis not present

## 2017-04-28 DIAGNOSIS — F0390 Unspecified dementia without behavioral disturbance: Secondary | ICD-10-CM | POA: Diagnosis not present

## 2017-04-29 DIAGNOSIS — F0390 Unspecified dementia without behavioral disturbance: Secondary | ICD-10-CM | POA: Diagnosis not present

## 2017-04-30 DIAGNOSIS — F0390 Unspecified dementia without behavioral disturbance: Secondary | ICD-10-CM | POA: Diagnosis not present

## 2017-05-01 DIAGNOSIS — F0390 Unspecified dementia without behavioral disturbance: Secondary | ICD-10-CM | POA: Diagnosis not present

## 2017-05-02 DIAGNOSIS — F0390 Unspecified dementia without behavioral disturbance: Secondary | ICD-10-CM | POA: Diagnosis not present

## 2017-05-03 DIAGNOSIS — F0390 Unspecified dementia without behavioral disturbance: Secondary | ICD-10-CM | POA: Diagnosis not present

## 2017-05-04 DIAGNOSIS — F0390 Unspecified dementia without behavioral disturbance: Secondary | ICD-10-CM | POA: Diagnosis not present

## 2017-05-05 DIAGNOSIS — F0390 Unspecified dementia without behavioral disturbance: Secondary | ICD-10-CM | POA: Diagnosis not present

## 2017-05-06 DIAGNOSIS — F0390 Unspecified dementia without behavioral disturbance: Secondary | ICD-10-CM | POA: Diagnosis not present

## 2017-05-07 DIAGNOSIS — F0390 Unspecified dementia without behavioral disturbance: Secondary | ICD-10-CM | POA: Diagnosis not present

## 2017-05-08 DIAGNOSIS — F0390 Unspecified dementia without behavioral disturbance: Secondary | ICD-10-CM | POA: Diagnosis not present

## 2017-05-09 DIAGNOSIS — F0390 Unspecified dementia without behavioral disturbance: Secondary | ICD-10-CM | POA: Diagnosis not present

## 2017-05-10 DIAGNOSIS — F0390 Unspecified dementia without behavioral disturbance: Secondary | ICD-10-CM | POA: Diagnosis not present

## 2017-05-11 DIAGNOSIS — F0390 Unspecified dementia without behavioral disturbance: Secondary | ICD-10-CM | POA: Diagnosis not present

## 2017-05-12 DIAGNOSIS — F0390 Unspecified dementia without behavioral disturbance: Secondary | ICD-10-CM | POA: Diagnosis not present

## 2017-05-13 DIAGNOSIS — F0390 Unspecified dementia without behavioral disturbance: Secondary | ICD-10-CM | POA: Diagnosis not present

## 2017-05-13 DIAGNOSIS — R1310 Dysphagia, unspecified: Secondary | ICD-10-CM | POA: Diagnosis not present

## 2017-05-13 DIAGNOSIS — E871 Hypo-osmolality and hyponatremia: Secondary | ICD-10-CM | POA: Diagnosis not present

## 2017-05-13 DIAGNOSIS — F0391 Unspecified dementia with behavioral disturbance: Secondary | ICD-10-CM | POA: Diagnosis not present

## 2017-05-13 DIAGNOSIS — E559 Vitamin D deficiency, unspecified: Secondary | ICD-10-CM | POA: Diagnosis not present

## 2017-05-14 DIAGNOSIS — F0390 Unspecified dementia without behavioral disturbance: Secondary | ICD-10-CM | POA: Diagnosis not present

## 2017-05-15 DIAGNOSIS — F0390 Unspecified dementia without behavioral disturbance: Secondary | ICD-10-CM | POA: Diagnosis not present

## 2017-05-16 DIAGNOSIS — F0390 Unspecified dementia without behavioral disturbance: Secondary | ICD-10-CM | POA: Diagnosis not present

## 2017-05-17 DIAGNOSIS — F0390 Unspecified dementia without behavioral disturbance: Secondary | ICD-10-CM | POA: Diagnosis not present

## 2017-05-18 DIAGNOSIS — F0390 Unspecified dementia without behavioral disturbance: Secondary | ICD-10-CM | POA: Diagnosis not present

## 2017-05-19 DIAGNOSIS — F0390 Unspecified dementia without behavioral disturbance: Secondary | ICD-10-CM | POA: Diagnosis not present

## 2017-05-20 DIAGNOSIS — F0390 Unspecified dementia without behavioral disturbance: Secondary | ICD-10-CM | POA: Diagnosis not present

## 2017-05-21 DIAGNOSIS — F0390 Unspecified dementia without behavioral disturbance: Secondary | ICD-10-CM | POA: Diagnosis not present

## 2017-05-22 DIAGNOSIS — F0390 Unspecified dementia without behavioral disturbance: Secondary | ICD-10-CM | POA: Diagnosis not present

## 2017-05-23 DIAGNOSIS — F0390 Unspecified dementia without behavioral disturbance: Secondary | ICD-10-CM | POA: Diagnosis not present

## 2017-05-24 DIAGNOSIS — F0390 Unspecified dementia without behavioral disturbance: Secondary | ICD-10-CM | POA: Diagnosis not present

## 2017-05-25 DIAGNOSIS — F0390 Unspecified dementia without behavioral disturbance: Secondary | ICD-10-CM | POA: Diagnosis not present

## 2017-05-26 DIAGNOSIS — F0390 Unspecified dementia without behavioral disturbance: Secondary | ICD-10-CM | POA: Diagnosis not present

## 2017-05-27 DIAGNOSIS — F0390 Unspecified dementia without behavioral disturbance: Secondary | ICD-10-CM | POA: Diagnosis not present

## 2017-05-28 DIAGNOSIS — F0390 Unspecified dementia without behavioral disturbance: Secondary | ICD-10-CM | POA: Diagnosis not present

## 2017-05-29 DIAGNOSIS — F0390 Unspecified dementia without behavioral disturbance: Secondary | ICD-10-CM | POA: Diagnosis not present

## 2017-05-30 DIAGNOSIS — F0390 Unspecified dementia without behavioral disturbance: Secondary | ICD-10-CM | POA: Diagnosis not present

## 2017-05-31 DIAGNOSIS — F0390 Unspecified dementia without behavioral disturbance: Secondary | ICD-10-CM | POA: Diagnosis not present

## 2017-06-01 DIAGNOSIS — F0390 Unspecified dementia without behavioral disturbance: Secondary | ICD-10-CM | POA: Diagnosis not present

## 2017-06-02 DIAGNOSIS — F0390 Unspecified dementia without behavioral disturbance: Secondary | ICD-10-CM | POA: Diagnosis not present

## 2017-06-03 DIAGNOSIS — F0390 Unspecified dementia without behavioral disturbance: Secondary | ICD-10-CM | POA: Diagnosis not present

## 2017-06-04 DIAGNOSIS — F0390 Unspecified dementia without behavioral disturbance: Secondary | ICD-10-CM | POA: Diagnosis not present

## 2017-06-05 DIAGNOSIS — F0390 Unspecified dementia without behavioral disturbance: Secondary | ICD-10-CM | POA: Diagnosis not present

## 2017-06-06 DIAGNOSIS — F0390 Unspecified dementia without behavioral disturbance: Secondary | ICD-10-CM | POA: Diagnosis not present

## 2017-06-07 DIAGNOSIS — F0390 Unspecified dementia without behavioral disturbance: Secondary | ICD-10-CM | POA: Diagnosis not present

## 2017-06-08 DIAGNOSIS — F0390 Unspecified dementia without behavioral disturbance: Secondary | ICD-10-CM | POA: Diagnosis not present

## 2017-06-09 DIAGNOSIS — F0390 Unspecified dementia without behavioral disturbance: Secondary | ICD-10-CM | POA: Diagnosis not present

## 2017-06-10 DIAGNOSIS — F0390 Unspecified dementia without behavioral disturbance: Secondary | ICD-10-CM | POA: Diagnosis not present

## 2017-06-11 DIAGNOSIS — F0390 Unspecified dementia without behavioral disturbance: Secondary | ICD-10-CM | POA: Diagnosis not present

## 2017-06-12 DIAGNOSIS — F0390 Unspecified dementia without behavioral disturbance: Secondary | ICD-10-CM | POA: Diagnosis not present

## 2017-06-13 DIAGNOSIS — F0390 Unspecified dementia without behavioral disturbance: Secondary | ICD-10-CM | POA: Diagnosis not present

## 2017-06-14 DIAGNOSIS — F0390 Unspecified dementia without behavioral disturbance: Secondary | ICD-10-CM | POA: Diagnosis not present

## 2017-06-15 DIAGNOSIS — F0390 Unspecified dementia without behavioral disturbance: Secondary | ICD-10-CM | POA: Diagnosis not present

## 2017-06-16 DIAGNOSIS — F0390 Unspecified dementia without behavioral disturbance: Secondary | ICD-10-CM | POA: Diagnosis not present

## 2017-06-17 DIAGNOSIS — F0391 Unspecified dementia with behavioral disturbance: Secondary | ICD-10-CM | POA: Diagnosis not present

## 2017-06-17 DIAGNOSIS — J449 Chronic obstructive pulmonary disease, unspecified: Secondary | ICD-10-CM | POA: Diagnosis not present

## 2017-06-17 DIAGNOSIS — R1312 Dysphagia, oropharyngeal phase: Secondary | ICD-10-CM | POA: Diagnosis not present

## 2017-06-17 DIAGNOSIS — F0390 Unspecified dementia without behavioral disturbance: Secondary | ICD-10-CM | POA: Diagnosis not present

## 2017-06-18 DIAGNOSIS — F0390 Unspecified dementia without behavioral disturbance: Secondary | ICD-10-CM | POA: Diagnosis not present

## 2017-06-19 DIAGNOSIS — F0390 Unspecified dementia without behavioral disturbance: Secondary | ICD-10-CM | POA: Diagnosis not present

## 2017-06-20 DIAGNOSIS — F0390 Unspecified dementia without behavioral disturbance: Secondary | ICD-10-CM | POA: Diagnosis not present

## 2017-06-21 DIAGNOSIS — F0390 Unspecified dementia without behavioral disturbance: Secondary | ICD-10-CM | POA: Diagnosis not present

## 2017-06-22 DIAGNOSIS — F0390 Unspecified dementia without behavioral disturbance: Secondary | ICD-10-CM | POA: Diagnosis not present

## 2017-06-23 DIAGNOSIS — F0390 Unspecified dementia without behavioral disturbance: Secondary | ICD-10-CM | POA: Diagnosis not present

## 2017-06-24 DIAGNOSIS — F0390 Unspecified dementia without behavioral disturbance: Secondary | ICD-10-CM | POA: Diagnosis not present

## 2017-06-25 DIAGNOSIS — F0390 Unspecified dementia without behavioral disturbance: Secondary | ICD-10-CM | POA: Diagnosis not present

## 2017-06-26 DIAGNOSIS — F0390 Unspecified dementia without behavioral disturbance: Secondary | ICD-10-CM | POA: Diagnosis not present

## 2017-06-27 DIAGNOSIS — F0390 Unspecified dementia without behavioral disturbance: Secondary | ICD-10-CM | POA: Diagnosis not present

## 2017-06-28 DIAGNOSIS — F0390 Unspecified dementia without behavioral disturbance: Secondary | ICD-10-CM | POA: Diagnosis not present

## 2017-06-29 DIAGNOSIS — F0390 Unspecified dementia without behavioral disturbance: Secondary | ICD-10-CM | POA: Diagnosis not present

## 2017-06-30 DIAGNOSIS — R1312 Dysphagia, oropharyngeal phase: Secondary | ICD-10-CM | POA: Diagnosis not present

## 2017-06-30 DIAGNOSIS — J449 Chronic obstructive pulmonary disease, unspecified: Secondary | ICD-10-CM | POA: Diagnosis not present

## 2017-06-30 DIAGNOSIS — F0390 Unspecified dementia without behavioral disturbance: Secondary | ICD-10-CM | POA: Diagnosis not present

## 2017-06-30 DIAGNOSIS — F0391 Unspecified dementia with behavioral disturbance: Secondary | ICD-10-CM | POA: Diagnosis not present

## 2017-07-01 DIAGNOSIS — F0390 Unspecified dementia without behavioral disturbance: Secondary | ICD-10-CM | POA: Diagnosis not present

## 2017-07-02 DIAGNOSIS — F0391 Unspecified dementia with behavioral disturbance: Secondary | ICD-10-CM | POA: Diagnosis not present

## 2017-07-02 DIAGNOSIS — R1312 Dysphagia, oropharyngeal phase: Secondary | ICD-10-CM | POA: Diagnosis not present

## 2017-07-02 DIAGNOSIS — J449 Chronic obstructive pulmonary disease, unspecified: Secondary | ICD-10-CM | POA: Diagnosis not present

## 2017-07-02 DIAGNOSIS — F0390 Unspecified dementia without behavioral disturbance: Secondary | ICD-10-CM | POA: Diagnosis not present

## 2017-07-03 DIAGNOSIS — F0390 Unspecified dementia without behavioral disturbance: Secondary | ICD-10-CM | POA: Diagnosis not present

## 2017-07-04 DIAGNOSIS — F0390 Unspecified dementia without behavioral disturbance: Secondary | ICD-10-CM | POA: Diagnosis not present

## 2017-07-05 DIAGNOSIS — F0390 Unspecified dementia without behavioral disturbance: Secondary | ICD-10-CM | POA: Diagnosis not present

## 2017-07-06 DIAGNOSIS — F0390 Unspecified dementia without behavioral disturbance: Secondary | ICD-10-CM | POA: Diagnosis not present

## 2017-07-07 DIAGNOSIS — F0390 Unspecified dementia without behavioral disturbance: Secondary | ICD-10-CM | POA: Diagnosis not present

## 2017-07-07 DIAGNOSIS — J449 Chronic obstructive pulmonary disease, unspecified: Secondary | ICD-10-CM | POA: Diagnosis not present

## 2017-07-07 DIAGNOSIS — R1312 Dysphagia, oropharyngeal phase: Secondary | ICD-10-CM | POA: Diagnosis not present

## 2017-07-07 DIAGNOSIS — F0391 Unspecified dementia with behavioral disturbance: Secondary | ICD-10-CM | POA: Diagnosis not present

## 2017-07-08 DIAGNOSIS — F0390 Unspecified dementia without behavioral disturbance: Secondary | ICD-10-CM | POA: Diagnosis not present

## 2017-07-09 DIAGNOSIS — F0390 Unspecified dementia without behavioral disturbance: Secondary | ICD-10-CM | POA: Diagnosis not present

## 2017-07-09 DIAGNOSIS — J449 Chronic obstructive pulmonary disease, unspecified: Secondary | ICD-10-CM | POA: Diagnosis not present

## 2017-07-09 DIAGNOSIS — R1312 Dysphagia, oropharyngeal phase: Secondary | ICD-10-CM | POA: Diagnosis not present

## 2017-07-09 DIAGNOSIS — F0391 Unspecified dementia with behavioral disturbance: Secondary | ICD-10-CM | POA: Diagnosis not present

## 2017-07-10 DIAGNOSIS — F0390 Unspecified dementia without behavioral disturbance: Secondary | ICD-10-CM | POA: Diagnosis not present

## 2017-07-11 DIAGNOSIS — F0390 Unspecified dementia without behavioral disturbance: Secondary | ICD-10-CM | POA: Diagnosis not present

## 2017-07-12 DIAGNOSIS — F0390 Unspecified dementia without behavioral disturbance: Secondary | ICD-10-CM | POA: Diagnosis not present

## 2017-07-13 DIAGNOSIS — F0390 Unspecified dementia without behavioral disturbance: Secondary | ICD-10-CM | POA: Diagnosis not present

## 2017-07-14 DIAGNOSIS — F0390 Unspecified dementia without behavioral disturbance: Secondary | ICD-10-CM | POA: Diagnosis not present

## 2017-07-15 DIAGNOSIS — F0390 Unspecified dementia without behavioral disturbance: Secondary | ICD-10-CM | POA: Diagnosis not present

## 2017-07-16 DIAGNOSIS — F0390 Unspecified dementia without behavioral disturbance: Secondary | ICD-10-CM | POA: Diagnosis not present

## 2017-07-17 DIAGNOSIS — F0390 Unspecified dementia without behavioral disturbance: Secondary | ICD-10-CM | POA: Diagnosis not present

## 2017-07-18 DIAGNOSIS — F0390 Unspecified dementia without behavioral disturbance: Secondary | ICD-10-CM | POA: Diagnosis not present

## 2017-07-19 DIAGNOSIS — F0390 Unspecified dementia without behavioral disturbance: Secondary | ICD-10-CM | POA: Diagnosis not present

## 2017-07-20 DIAGNOSIS — F0390 Unspecified dementia without behavioral disturbance: Secondary | ICD-10-CM | POA: Diagnosis not present

## 2017-07-21 DIAGNOSIS — F0390 Unspecified dementia without behavioral disturbance: Secondary | ICD-10-CM | POA: Diagnosis not present

## 2017-07-22 DIAGNOSIS — F0390 Unspecified dementia without behavioral disturbance: Secondary | ICD-10-CM | POA: Diagnosis not present

## 2017-07-23 DIAGNOSIS — F0390 Unspecified dementia without behavioral disturbance: Secondary | ICD-10-CM | POA: Diagnosis not present

## 2017-07-24 DIAGNOSIS — F0390 Unspecified dementia without behavioral disturbance: Secondary | ICD-10-CM | POA: Diagnosis not present

## 2017-07-25 DIAGNOSIS — F0390 Unspecified dementia without behavioral disturbance: Secondary | ICD-10-CM | POA: Diagnosis not present

## 2017-07-26 DIAGNOSIS — F0390 Unspecified dementia without behavioral disturbance: Secondary | ICD-10-CM | POA: Diagnosis not present

## 2017-07-27 DIAGNOSIS — F0390 Unspecified dementia without behavioral disturbance: Secondary | ICD-10-CM | POA: Diagnosis not present

## 2017-07-28 DIAGNOSIS — F0390 Unspecified dementia without behavioral disturbance: Secondary | ICD-10-CM | POA: Diagnosis not present

## 2017-07-29 DIAGNOSIS — F0390 Unspecified dementia without behavioral disturbance: Secondary | ICD-10-CM | POA: Diagnosis not present

## 2017-07-30 DIAGNOSIS — F0390 Unspecified dementia without behavioral disturbance: Secondary | ICD-10-CM | POA: Diagnosis not present

## 2017-07-31 DIAGNOSIS — F0390 Unspecified dementia without behavioral disturbance: Secondary | ICD-10-CM | POA: Diagnosis not present

## 2017-08-01 DIAGNOSIS — Z961 Presence of intraocular lens: Secondary | ICD-10-CM | POA: Diagnosis not present

## 2017-08-01 DIAGNOSIS — F0390 Unspecified dementia without behavioral disturbance: Secondary | ICD-10-CM | POA: Diagnosis not present

## 2017-08-01 DIAGNOSIS — E119 Type 2 diabetes mellitus without complications: Secondary | ICD-10-CM | POA: Diagnosis not present

## 2017-08-02 DIAGNOSIS — F0390 Unspecified dementia without behavioral disturbance: Secondary | ICD-10-CM | POA: Diagnosis not present

## 2017-08-03 DIAGNOSIS — F0390 Unspecified dementia without behavioral disturbance: Secondary | ICD-10-CM | POA: Diagnosis not present

## 2017-08-04 DIAGNOSIS — F0390 Unspecified dementia without behavioral disturbance: Secondary | ICD-10-CM | POA: Diagnosis not present

## 2017-08-05 DIAGNOSIS — F0390 Unspecified dementia without behavioral disturbance: Secondary | ICD-10-CM | POA: Diagnosis not present

## 2017-08-06 DIAGNOSIS — F0390 Unspecified dementia without behavioral disturbance: Secondary | ICD-10-CM | POA: Diagnosis not present

## 2017-08-07 DIAGNOSIS — F0390 Unspecified dementia without behavioral disturbance: Secondary | ICD-10-CM | POA: Diagnosis not present

## 2017-08-08 DIAGNOSIS — F0390 Unspecified dementia without behavioral disturbance: Secondary | ICD-10-CM | POA: Diagnosis not present

## 2017-08-09 DIAGNOSIS — F0390 Unspecified dementia without behavioral disturbance: Secondary | ICD-10-CM | POA: Diagnosis not present

## 2017-08-10 DIAGNOSIS — F0390 Unspecified dementia without behavioral disturbance: Secondary | ICD-10-CM | POA: Diagnosis not present

## 2017-08-11 DIAGNOSIS — F0390 Unspecified dementia without behavioral disturbance: Secondary | ICD-10-CM | POA: Diagnosis not present

## 2017-08-12 DIAGNOSIS — F0390 Unspecified dementia without behavioral disturbance: Secondary | ICD-10-CM | POA: Diagnosis not present

## 2017-08-13 DIAGNOSIS — F0390 Unspecified dementia without behavioral disturbance: Secondary | ICD-10-CM | POA: Diagnosis not present

## 2017-08-20 DIAGNOSIS — E559 Vitamin D deficiency, unspecified: Secondary | ICD-10-CM | POA: Diagnosis not present

## 2017-08-20 DIAGNOSIS — F0391 Unspecified dementia with behavioral disturbance: Secondary | ICD-10-CM | POA: Diagnosis not present

## 2017-08-20 DIAGNOSIS — E871 Hypo-osmolality and hyponatremia: Secondary | ICD-10-CM | POA: Diagnosis not present

## 2017-08-20 DIAGNOSIS — I10 Essential (primary) hypertension: Secondary | ICD-10-CM | POA: Diagnosis not present

## 2017-08-21 DIAGNOSIS — I10 Essential (primary) hypertension: Secondary | ICD-10-CM | POA: Diagnosis not present

## 2017-08-22 DIAGNOSIS — I10 Essential (primary) hypertension: Secondary | ICD-10-CM | POA: Diagnosis not present

## 2017-08-23 DIAGNOSIS — I10 Essential (primary) hypertension: Secondary | ICD-10-CM | POA: Diagnosis not present

## 2017-08-24 DIAGNOSIS — I10 Essential (primary) hypertension: Secondary | ICD-10-CM | POA: Diagnosis not present

## 2017-08-25 DIAGNOSIS — I10 Essential (primary) hypertension: Secondary | ICD-10-CM | POA: Diagnosis not present

## 2017-08-26 DIAGNOSIS — I10 Essential (primary) hypertension: Secondary | ICD-10-CM | POA: Diagnosis not present

## 2017-08-27 DIAGNOSIS — I10 Essential (primary) hypertension: Secondary | ICD-10-CM | POA: Diagnosis not present

## 2017-08-28 DIAGNOSIS — I10 Essential (primary) hypertension: Secondary | ICD-10-CM | POA: Diagnosis not present

## 2017-08-29 DIAGNOSIS — I10 Essential (primary) hypertension: Secondary | ICD-10-CM | POA: Diagnosis not present

## 2017-08-30 DIAGNOSIS — I10 Essential (primary) hypertension: Secondary | ICD-10-CM | POA: Diagnosis not present

## 2017-08-31 DIAGNOSIS — I10 Essential (primary) hypertension: Secondary | ICD-10-CM | POA: Diagnosis not present

## 2017-09-01 DIAGNOSIS — I10 Essential (primary) hypertension: Secondary | ICD-10-CM | POA: Diagnosis not present

## 2017-09-02 DIAGNOSIS — I10 Essential (primary) hypertension: Secondary | ICD-10-CM | POA: Diagnosis not present

## 2017-09-03 ENCOUNTER — Encounter: Payer: Self-pay | Admitting: Internal Medicine

## 2017-09-03 DIAGNOSIS — I10 Essential (primary) hypertension: Secondary | ICD-10-CM | POA: Diagnosis not present

## 2017-09-04 DIAGNOSIS — I10 Essential (primary) hypertension: Secondary | ICD-10-CM | POA: Diagnosis not present

## 2017-09-05 DIAGNOSIS — I10 Essential (primary) hypertension: Secondary | ICD-10-CM | POA: Diagnosis not present

## 2017-09-06 DIAGNOSIS — I10 Essential (primary) hypertension: Secondary | ICD-10-CM | POA: Diagnosis not present

## 2017-09-07 DIAGNOSIS — I10 Essential (primary) hypertension: Secondary | ICD-10-CM | POA: Diagnosis not present

## 2017-09-08 DIAGNOSIS — I10 Essential (primary) hypertension: Secondary | ICD-10-CM | POA: Diagnosis not present

## 2017-09-09 DIAGNOSIS — I10 Essential (primary) hypertension: Secondary | ICD-10-CM | POA: Diagnosis not present

## 2017-09-10 DIAGNOSIS — I10 Essential (primary) hypertension: Secondary | ICD-10-CM | POA: Diagnosis not present

## 2017-09-11 DIAGNOSIS — I10 Essential (primary) hypertension: Secondary | ICD-10-CM | POA: Diagnosis not present

## 2017-09-12 DIAGNOSIS — I10 Essential (primary) hypertension: Secondary | ICD-10-CM | POA: Diagnosis not present

## 2017-09-13 DIAGNOSIS — I10 Essential (primary) hypertension: Secondary | ICD-10-CM | POA: Diagnosis not present

## 2017-09-14 DIAGNOSIS — I10 Essential (primary) hypertension: Secondary | ICD-10-CM | POA: Diagnosis not present

## 2017-09-15 DIAGNOSIS — I10 Essential (primary) hypertension: Secondary | ICD-10-CM | POA: Diagnosis not present

## 2017-09-16 DIAGNOSIS — L299 Pruritus, unspecified: Secondary | ICD-10-CM | POA: Diagnosis not present

## 2017-09-16 DIAGNOSIS — L239 Allergic contact dermatitis, unspecified cause: Secondary | ICD-10-CM | POA: Diagnosis not present

## 2017-09-16 DIAGNOSIS — I10 Essential (primary) hypertension: Secondary | ICD-10-CM | POA: Diagnosis not present

## 2017-09-17 DIAGNOSIS — I10 Essential (primary) hypertension: Secondary | ICD-10-CM | POA: Diagnosis not present

## 2017-09-18 DIAGNOSIS — I10 Essential (primary) hypertension: Secondary | ICD-10-CM | POA: Diagnosis not present

## 2017-09-19 DIAGNOSIS — I10 Essential (primary) hypertension: Secondary | ICD-10-CM | POA: Diagnosis not present

## 2017-09-20 DIAGNOSIS — I10 Essential (primary) hypertension: Secondary | ICD-10-CM | POA: Diagnosis not present

## 2017-09-21 DIAGNOSIS — I10 Essential (primary) hypertension: Secondary | ICD-10-CM | POA: Diagnosis not present

## 2017-09-22 DIAGNOSIS — I10 Essential (primary) hypertension: Secondary | ICD-10-CM | POA: Diagnosis not present

## 2017-09-23 DIAGNOSIS — I10 Essential (primary) hypertension: Secondary | ICD-10-CM | POA: Diagnosis not present

## 2017-09-24 DIAGNOSIS — I10 Essential (primary) hypertension: Secondary | ICD-10-CM | POA: Diagnosis not present

## 2017-09-25 DIAGNOSIS — I10 Essential (primary) hypertension: Secondary | ICD-10-CM | POA: Diagnosis not present

## 2017-09-26 DIAGNOSIS — I10 Essential (primary) hypertension: Secondary | ICD-10-CM | POA: Diagnosis not present

## 2017-09-27 DIAGNOSIS — I10 Essential (primary) hypertension: Secondary | ICD-10-CM | POA: Diagnosis not present

## 2017-09-28 DIAGNOSIS — I10 Essential (primary) hypertension: Secondary | ICD-10-CM | POA: Diagnosis not present

## 2017-09-29 DIAGNOSIS — I10 Essential (primary) hypertension: Secondary | ICD-10-CM | POA: Diagnosis not present

## 2017-09-30 DIAGNOSIS — I10 Essential (primary) hypertension: Secondary | ICD-10-CM | POA: Diagnosis not present

## 2017-10-01 DIAGNOSIS — I10 Essential (primary) hypertension: Secondary | ICD-10-CM | POA: Diagnosis not present

## 2017-10-02 DIAGNOSIS — I10 Essential (primary) hypertension: Secondary | ICD-10-CM | POA: Diagnosis not present

## 2017-10-03 DIAGNOSIS — I10 Essential (primary) hypertension: Secondary | ICD-10-CM | POA: Diagnosis not present

## 2017-10-04 DIAGNOSIS — I10 Essential (primary) hypertension: Secondary | ICD-10-CM | POA: Diagnosis not present

## 2017-10-05 DIAGNOSIS — I10 Essential (primary) hypertension: Secondary | ICD-10-CM | POA: Diagnosis not present

## 2017-10-06 DIAGNOSIS — I10 Essential (primary) hypertension: Secondary | ICD-10-CM | POA: Diagnosis not present

## 2017-10-07 DIAGNOSIS — I10 Essential (primary) hypertension: Secondary | ICD-10-CM | POA: Diagnosis not present

## 2017-10-08 DIAGNOSIS — I10 Essential (primary) hypertension: Secondary | ICD-10-CM | POA: Diagnosis not present

## 2017-10-09 DIAGNOSIS — I10 Essential (primary) hypertension: Secondary | ICD-10-CM | POA: Diagnosis not present

## 2017-10-10 DIAGNOSIS — I10 Essential (primary) hypertension: Secondary | ICD-10-CM | POA: Diagnosis not present

## 2017-10-11 DIAGNOSIS — I10 Essential (primary) hypertension: Secondary | ICD-10-CM | POA: Diagnosis not present

## 2017-10-12 DIAGNOSIS — I10 Essential (primary) hypertension: Secondary | ICD-10-CM | POA: Diagnosis not present

## 2017-10-13 DIAGNOSIS — I10 Essential (primary) hypertension: Secondary | ICD-10-CM | POA: Diagnosis not present

## 2017-10-14 DIAGNOSIS — I10 Essential (primary) hypertension: Secondary | ICD-10-CM | POA: Diagnosis not present

## 2017-10-15 DIAGNOSIS — I10 Essential (primary) hypertension: Secondary | ICD-10-CM | POA: Diagnosis not present

## 2017-10-16 DIAGNOSIS — I10 Essential (primary) hypertension: Secondary | ICD-10-CM | POA: Diagnosis not present

## 2017-10-17 DIAGNOSIS — I10 Essential (primary) hypertension: Secondary | ICD-10-CM | POA: Diagnosis not present

## 2017-10-18 DIAGNOSIS — I10 Essential (primary) hypertension: Secondary | ICD-10-CM | POA: Diagnosis not present

## 2017-10-19 DIAGNOSIS — I10 Essential (primary) hypertension: Secondary | ICD-10-CM | POA: Diagnosis not present

## 2017-10-20 DIAGNOSIS — I10 Essential (primary) hypertension: Secondary | ICD-10-CM | POA: Diagnosis not present

## 2017-10-21 DIAGNOSIS — I10 Essential (primary) hypertension: Secondary | ICD-10-CM | POA: Diagnosis not present

## 2017-10-22 DIAGNOSIS — I10 Essential (primary) hypertension: Secondary | ICD-10-CM | POA: Diagnosis not present

## 2017-10-23 DIAGNOSIS — I10 Essential (primary) hypertension: Secondary | ICD-10-CM | POA: Diagnosis not present

## 2017-10-24 ENCOUNTER — Emergency Department (HOSPITAL_COMMUNITY)
Admission: EM | Admit: 2017-10-24 | Discharge: 2017-10-24 | Disposition: A | Discharge status: Skilled Nursing Facility | Payer: Medicare HMO | Attending: Emergency Medicine | Admitting: Emergency Medicine

## 2017-10-24 DIAGNOSIS — S0001XA Abrasion of scalp, initial encounter: Secondary | ICD-10-CM | POA: Diagnosis not present

## 2017-10-24 DIAGNOSIS — J449 Chronic obstructive pulmonary disease, unspecified: Secondary | ICD-10-CM | POA: Insufficient documentation

## 2017-10-24 DIAGNOSIS — Z23 Encounter for immunization: Secondary | ICD-10-CM | POA: Diagnosis not present

## 2017-10-24 DIAGNOSIS — Z743 Need for continuous supervision: Secondary | ICD-10-CM | POA: Diagnosis not present

## 2017-10-24 DIAGNOSIS — S0990XA Unspecified injury of head, initial encounter: Secondary | ICD-10-CM | POA: Diagnosis present

## 2017-10-24 DIAGNOSIS — I129 Hypertensive chronic kidney disease with stage 1 through stage 4 chronic kidney disease, or unspecified chronic kidney disease: Secondary | ICD-10-CM | POA: Insufficient documentation

## 2017-10-24 DIAGNOSIS — Z87891 Personal history of nicotine dependence: Secondary | ICD-10-CM | POA: Diagnosis not present

## 2017-10-24 DIAGNOSIS — Y92129 Unspecified place in nursing home as the place of occurrence of the external cause: Secondary | ICD-10-CM | POA: Diagnosis not present

## 2017-10-24 DIAGNOSIS — S0003XA Contusion of scalp, initial encounter: Secondary | ICD-10-CM | POA: Diagnosis not present

## 2017-10-24 DIAGNOSIS — Z79899 Other long term (current) drug therapy: Secondary | ICD-10-CM | POA: Diagnosis not present

## 2017-10-24 DIAGNOSIS — N183 Chronic kidney disease, stage 3 (moderate): Secondary | ICD-10-CM | POA: Insufficient documentation

## 2017-10-24 DIAGNOSIS — Y939 Activity, unspecified: Secondary | ICD-10-CM | POA: Insufficient documentation

## 2017-10-24 DIAGNOSIS — R58 Hemorrhage, not elsewhere classified: Secondary | ICD-10-CM | POA: Diagnosis not present

## 2017-10-24 DIAGNOSIS — Y999 Unspecified external cause status: Secondary | ICD-10-CM | POA: Diagnosis not present

## 2017-10-24 DIAGNOSIS — I10 Essential (primary) hypertension: Secondary | ICD-10-CM | POA: Diagnosis not present

## 2017-10-24 DIAGNOSIS — F0390 Unspecified dementia without behavioral disturbance: Secondary | ICD-10-CM | POA: Insufficient documentation

## 2017-10-24 DIAGNOSIS — R279 Unspecified lack of coordination: Secondary | ICD-10-CM | POA: Diagnosis not present

## 2017-10-24 MED ORDER — TETANUS-DIPHTH-ACELL PERTUSSIS 5-2.5-18.5 LF-MCG/0.5 IM SUSP
0.5000 mL | Freq: Once | INTRAMUSCULAR | Status: AC
  Administered 2017-10-24: 0.5 mL via INTRAMUSCULAR
  Filled 2017-10-24: qty 0.5

## 2017-10-24 MED ORDER — LIDOCAINE-EPINEPHRINE (PF) 2 %-1:200000 IJ SOLN
10.0000 mL | Freq: Once | INTRAMUSCULAR | Status: AC
  Administered 2017-10-24: 10 mL
  Filled 2017-10-24: qty 20

## 2017-10-24 NOTE — Discharge Instructions (Addendum)
Follow up with your doctor as needed for any new or concerning symptoms. Take Tylenol as needed for pain. Return to the emergency department with any new injury or medical emergency.
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
## 2017-10-25 DIAGNOSIS — I10 Essential (primary) hypertension: Secondary | ICD-10-CM | POA: Diagnosis not present

## 2017-10-26 DIAGNOSIS — I10 Essential (primary) hypertension: Secondary | ICD-10-CM | POA: Diagnosis not present

## 2017-10-27 DIAGNOSIS — I10 Essential (primary) hypertension: Secondary | ICD-10-CM | POA: Diagnosis not present

## 2017-10-28 DIAGNOSIS — I10 Essential (primary) hypertension: Secondary | ICD-10-CM | POA: Diagnosis not present

## 2017-10-28 DIAGNOSIS — F0391 Unspecified dementia with behavioral disturbance: Secondary | ICD-10-CM | POA: Diagnosis not present

## 2017-10-29 DIAGNOSIS — I10 Essential (primary) hypertension: Secondary | ICD-10-CM | POA: Diagnosis not present

## 2017-10-30 DIAGNOSIS — I10 Essential (primary) hypertension: Secondary | ICD-10-CM | POA: Diagnosis not present

## 2017-10-31 DIAGNOSIS — I10 Essential (primary) hypertension: Secondary | ICD-10-CM | POA: Diagnosis not present

## 2017-11-01 DIAGNOSIS — I10 Essential (primary) hypertension: Secondary | ICD-10-CM | POA: Diagnosis not present

## 2017-11-02 DIAGNOSIS — I10 Essential (primary) hypertension: Secondary | ICD-10-CM | POA: Diagnosis not present

## 2017-11-03 DIAGNOSIS — I10 Essential (primary) hypertension: Secondary | ICD-10-CM | POA: Diagnosis not present

## 2017-11-04 DIAGNOSIS — I10 Essential (primary) hypertension: Secondary | ICD-10-CM | POA: Diagnosis not present

## 2017-11-05 DIAGNOSIS — I10 Essential (primary) hypertension: Secondary | ICD-10-CM | POA: Diagnosis not present

## 2017-11-06 DIAGNOSIS — I10 Essential (primary) hypertension: Secondary | ICD-10-CM | POA: Diagnosis not present

## 2017-11-07 DIAGNOSIS — I10 Essential (primary) hypertension: Secondary | ICD-10-CM | POA: Diagnosis not present

## 2017-11-08 DIAGNOSIS — I10 Essential (primary) hypertension: Secondary | ICD-10-CM | POA: Diagnosis not present

## 2017-11-09 DIAGNOSIS — I10 Essential (primary) hypertension: Secondary | ICD-10-CM | POA: Diagnosis not present

## 2017-11-10 DIAGNOSIS — I10 Essential (primary) hypertension: Secondary | ICD-10-CM | POA: Diagnosis not present

## 2017-11-11 DIAGNOSIS — I10 Essential (primary) hypertension: Secondary | ICD-10-CM | POA: Diagnosis not present

## 2017-11-12 DIAGNOSIS — I10 Essential (primary) hypertension: Secondary | ICD-10-CM | POA: Diagnosis not present

## 2017-11-13 DIAGNOSIS — I10 Essential (primary) hypertension: Secondary | ICD-10-CM | POA: Diagnosis not present

## 2017-11-14 DIAGNOSIS — I10 Essential (primary) hypertension: Secondary | ICD-10-CM | POA: Diagnosis not present

## 2017-11-15 DIAGNOSIS — I10 Essential (primary) hypertension: Secondary | ICD-10-CM | POA: Diagnosis not present

## 2017-11-16 DIAGNOSIS — I10 Essential (primary) hypertension: Secondary | ICD-10-CM | POA: Diagnosis not present

## 2017-11-17 DIAGNOSIS — I10 Essential (primary) hypertension: Secondary | ICD-10-CM | POA: Diagnosis not present

## 2017-11-18 DIAGNOSIS — I10 Essential (primary) hypertension: Secondary | ICD-10-CM | POA: Diagnosis not present

## 2017-11-19 DIAGNOSIS — I10 Essential (primary) hypertension: Secondary | ICD-10-CM | POA: Diagnosis not present

## 2017-11-20 DIAGNOSIS — I10 Essential (primary) hypertension: Secondary | ICD-10-CM | POA: Diagnosis not present

## 2017-11-21 DIAGNOSIS — I10 Essential (primary) hypertension: Secondary | ICD-10-CM | POA: Diagnosis not present

## 2017-11-22 DIAGNOSIS — I10 Essential (primary) hypertension: Secondary | ICD-10-CM | POA: Diagnosis not present

## 2017-11-23 DIAGNOSIS — I10 Essential (primary) hypertension: Secondary | ICD-10-CM | POA: Diagnosis not present

## 2017-11-24 DIAGNOSIS — I10 Essential (primary) hypertension: Secondary | ICD-10-CM | POA: Diagnosis not present

## 2017-11-25 DIAGNOSIS — I10 Essential (primary) hypertension: Secondary | ICD-10-CM | POA: Diagnosis not present

## 2017-11-26 DIAGNOSIS — I10 Essential (primary) hypertension: Secondary | ICD-10-CM | POA: Diagnosis not present

## 2017-11-26 DIAGNOSIS — M6281 Muscle weakness (generalized): Secondary | ICD-10-CM | POA: Diagnosis not present

## 2017-11-26 DIAGNOSIS — F0391 Unspecified dementia with behavioral disturbance: Secondary | ICD-10-CM | POA: Diagnosis not present

## 2017-11-27 DIAGNOSIS — I10 Essential (primary) hypertension: Secondary | ICD-10-CM | POA: Diagnosis not present

## 2017-11-28 DIAGNOSIS — I10 Essential (primary) hypertension: Secondary | ICD-10-CM | POA: Diagnosis not present

## 2017-11-29 DIAGNOSIS — I10 Essential (primary) hypertension: Secondary | ICD-10-CM | POA: Diagnosis not present

## 2017-11-30 DIAGNOSIS — I10 Essential (primary) hypertension: Secondary | ICD-10-CM | POA: Diagnosis not present

## 2017-12-01 DIAGNOSIS — I10 Essential (primary) hypertension: Secondary | ICD-10-CM | POA: Diagnosis not present

## 2017-12-09 DIAGNOSIS — I10 Essential (primary) hypertension: Secondary | ICD-10-CM | POA: Diagnosis not present

## 2017-12-10 DIAGNOSIS — I10 Essential (primary) hypertension: Secondary | ICD-10-CM | POA: Diagnosis not present

## 2017-12-11 DIAGNOSIS — I10 Essential (primary) hypertension: Secondary | ICD-10-CM | POA: Diagnosis not present

## 2017-12-12 DIAGNOSIS — I10 Essential (primary) hypertension: Secondary | ICD-10-CM | POA: Diagnosis not present

## 2017-12-13 DIAGNOSIS — I10 Essential (primary) hypertension: Secondary | ICD-10-CM | POA: Diagnosis not present

## 2017-12-14 DIAGNOSIS — I10 Essential (primary) hypertension: Secondary | ICD-10-CM | POA: Diagnosis not present

## 2017-12-15 DIAGNOSIS — I10 Essential (primary) hypertension: Secondary | ICD-10-CM | POA: Diagnosis not present

## 2017-12-16 DIAGNOSIS — I10 Essential (primary) hypertension: Secondary | ICD-10-CM | POA: Diagnosis not present

## 2017-12-17 DIAGNOSIS — I10 Essential (primary) hypertension: Secondary | ICD-10-CM | POA: Diagnosis not present

## 2017-12-18 DIAGNOSIS — I10 Essential (primary) hypertension: Secondary | ICD-10-CM | POA: Diagnosis not present

## 2017-12-19 DIAGNOSIS — I10 Essential (primary) hypertension: Secondary | ICD-10-CM | POA: Diagnosis not present

## 2017-12-20 DIAGNOSIS — I10 Essential (primary) hypertension: Secondary | ICD-10-CM | POA: Diagnosis not present

## 2017-12-21 DIAGNOSIS — I10 Essential (primary) hypertension: Secondary | ICD-10-CM | POA: Diagnosis not present

## 2017-12-22 DIAGNOSIS — I10 Essential (primary) hypertension: Secondary | ICD-10-CM | POA: Diagnosis not present

## 2017-12-23 DIAGNOSIS — I10 Essential (primary) hypertension: Secondary | ICD-10-CM | POA: Diagnosis not present

## 2017-12-24 DIAGNOSIS — I10 Essential (primary) hypertension: Secondary | ICD-10-CM | POA: Diagnosis not present

## 2017-12-25 DIAGNOSIS — I10 Essential (primary) hypertension: Secondary | ICD-10-CM | POA: Diagnosis not present

## 2017-12-26 DIAGNOSIS — I10 Essential (primary) hypertension: Secondary | ICD-10-CM | POA: Diagnosis not present

## 2017-12-27 DIAGNOSIS — I10 Essential (primary) hypertension: Secondary | ICD-10-CM | POA: Diagnosis not present

## 2017-12-28 DIAGNOSIS — I10 Essential (primary) hypertension: Secondary | ICD-10-CM | POA: Diagnosis not present

## 2017-12-29 DIAGNOSIS — I10 Essential (primary) hypertension: Secondary | ICD-10-CM | POA: Diagnosis not present

## 2017-12-30 DIAGNOSIS — I10 Essential (primary) hypertension: Secondary | ICD-10-CM | POA: Diagnosis not present

## 2017-12-31 DIAGNOSIS — I10 Essential (primary) hypertension: Secondary | ICD-10-CM | POA: Diagnosis not present

## 2018-01-01 DIAGNOSIS — I10 Essential (primary) hypertension: Secondary | ICD-10-CM | POA: Diagnosis not present

## 2018-01-02 DIAGNOSIS — I10 Essential (primary) hypertension: Secondary | ICD-10-CM | POA: Diagnosis not present

## 2018-01-03 DIAGNOSIS — I10 Essential (primary) hypertension: Secondary | ICD-10-CM | POA: Diagnosis not present

## 2018-01-04 DIAGNOSIS — I10 Essential (primary) hypertension: Secondary | ICD-10-CM | POA: Diagnosis not present

## 2018-01-05 DIAGNOSIS — I10 Essential (primary) hypertension: Secondary | ICD-10-CM | POA: Diagnosis not present

## 2018-01-06 DIAGNOSIS — I10 Essential (primary) hypertension: Secondary | ICD-10-CM | POA: Diagnosis not present

## 2018-01-07 DIAGNOSIS — I10 Essential (primary) hypertension: Secondary | ICD-10-CM | POA: Diagnosis not present

## 2018-01-08 DIAGNOSIS — I10 Essential (primary) hypertension: Secondary | ICD-10-CM | POA: Diagnosis not present

## 2018-01-09 DIAGNOSIS — I10 Essential (primary) hypertension: Secondary | ICD-10-CM | POA: Diagnosis not present

## 2018-01-10 DIAGNOSIS — I10 Essential (primary) hypertension: Secondary | ICD-10-CM | POA: Diagnosis not present

## 2018-01-11 DIAGNOSIS — I10 Essential (primary) hypertension: Secondary | ICD-10-CM | POA: Diagnosis not present

## 2018-01-12 DIAGNOSIS — I10 Essential (primary) hypertension: Secondary | ICD-10-CM | POA: Diagnosis not present

## 2018-01-13 DIAGNOSIS — I10 Essential (primary) hypertension: Secondary | ICD-10-CM | POA: Diagnosis not present

## 2018-01-14 DIAGNOSIS — I10 Essential (primary) hypertension: Secondary | ICD-10-CM | POA: Diagnosis not present

## 2018-01-15 DIAGNOSIS — I10 Essential (primary) hypertension: Secondary | ICD-10-CM | POA: Diagnosis not present

## 2018-01-16 DIAGNOSIS — I10 Essential (primary) hypertension: Secondary | ICD-10-CM | POA: Diagnosis not present

## 2018-01-17 DIAGNOSIS — I10 Essential (primary) hypertension: Secondary | ICD-10-CM | POA: Diagnosis not present

## 2018-01-18 DIAGNOSIS — I10 Essential (primary) hypertension: Secondary | ICD-10-CM | POA: Diagnosis not present

## 2018-01-19 DIAGNOSIS — I10 Essential (primary) hypertension: Secondary | ICD-10-CM | POA: Diagnosis not present

## 2018-01-20 DIAGNOSIS — I10 Essential (primary) hypertension: Secondary | ICD-10-CM | POA: Diagnosis not present

## 2018-01-21 DIAGNOSIS — I10 Essential (primary) hypertension: Secondary | ICD-10-CM | POA: Diagnosis not present

## 2018-01-22 DIAGNOSIS — I10 Essential (primary) hypertension: Secondary | ICD-10-CM | POA: Diagnosis not present

## 2018-01-23 DIAGNOSIS — I10 Essential (primary) hypertension: Secondary | ICD-10-CM | POA: Diagnosis not present

## 2018-01-24 DIAGNOSIS — I10 Essential (primary) hypertension: Secondary | ICD-10-CM | POA: Diagnosis not present

## 2018-01-25 DIAGNOSIS — I10 Essential (primary) hypertension: Secondary | ICD-10-CM | POA: Diagnosis not present

## 2018-01-26 DIAGNOSIS — I10 Essential (primary) hypertension: Secondary | ICD-10-CM | POA: Diagnosis not present

## 2018-01-27 DIAGNOSIS — I10 Essential (primary) hypertension: Secondary | ICD-10-CM | POA: Diagnosis not present

## 2018-01-28 DIAGNOSIS — I10 Essential (primary) hypertension: Secondary | ICD-10-CM | POA: Diagnosis not present

## 2018-01-29 DIAGNOSIS — I10 Essential (primary) hypertension: Secondary | ICD-10-CM | POA: Diagnosis not present

## 2018-01-30 DIAGNOSIS — I10 Essential (primary) hypertension: Secondary | ICD-10-CM | POA: Diagnosis not present

## 2018-01-31 DIAGNOSIS — I10 Essential (primary) hypertension: Secondary | ICD-10-CM | POA: Diagnosis not present

## 2018-02-01 DIAGNOSIS — I10 Essential (primary) hypertension: Secondary | ICD-10-CM | POA: Diagnosis not present

## 2018-02-02 DIAGNOSIS — I10 Essential (primary) hypertension: Secondary | ICD-10-CM | POA: Diagnosis not present

## 2018-02-03 DIAGNOSIS — I10 Essential (primary) hypertension: Secondary | ICD-10-CM | POA: Diagnosis not present

## 2018-02-04 DIAGNOSIS — I10 Essential (primary) hypertension: Secondary | ICD-10-CM | POA: Diagnosis not present

## 2018-02-05 DIAGNOSIS — I10 Essential (primary) hypertension: Secondary | ICD-10-CM | POA: Diagnosis not present

## 2018-02-06 DIAGNOSIS — I10 Essential (primary) hypertension: Secondary | ICD-10-CM | POA: Diagnosis not present

## 2018-02-07 DIAGNOSIS — I10 Essential (primary) hypertension: Secondary | ICD-10-CM | POA: Diagnosis not present

## 2018-02-08 DIAGNOSIS — I10 Essential (primary) hypertension: Secondary | ICD-10-CM | POA: Diagnosis not present

## 2018-02-09 DIAGNOSIS — I10 Essential (primary) hypertension: Secondary | ICD-10-CM | POA: Diagnosis not present

## 2018-02-10 DIAGNOSIS — I10 Essential (primary) hypertension: Secondary | ICD-10-CM | POA: Diagnosis not present

## 2018-02-11 DIAGNOSIS — I10 Essential (primary) hypertension: Secondary | ICD-10-CM | POA: Diagnosis not present

## 2018-02-12 DIAGNOSIS — I10 Essential (primary) hypertension: Secondary | ICD-10-CM | POA: Diagnosis not present

## 2018-02-13 DIAGNOSIS — I10 Essential (primary) hypertension: Secondary | ICD-10-CM | POA: Diagnosis not present

## 2018-02-14 DIAGNOSIS — I10 Essential (primary) hypertension: Secondary | ICD-10-CM | POA: Diagnosis not present

## 2018-02-15 DIAGNOSIS — I10 Essential (primary) hypertension: Secondary | ICD-10-CM | POA: Diagnosis not present

## 2018-02-16 DIAGNOSIS — I10 Essential (primary) hypertension: Secondary | ICD-10-CM | POA: Diagnosis not present

## 2018-02-17 DIAGNOSIS — I10 Essential (primary) hypertension: Secondary | ICD-10-CM | POA: Diagnosis not present

## 2018-02-18 DIAGNOSIS — I10 Essential (primary) hypertension: Secondary | ICD-10-CM | POA: Diagnosis not present

## 2018-02-19 DIAGNOSIS — I10 Essential (primary) hypertension: Secondary | ICD-10-CM | POA: Diagnosis not present

## 2018-02-20 DIAGNOSIS — I10 Essential (primary) hypertension: Secondary | ICD-10-CM | POA: Diagnosis not present

## 2018-02-21 DIAGNOSIS — I10 Essential (primary) hypertension: Secondary | ICD-10-CM | POA: Diagnosis not present

## 2018-02-22 DIAGNOSIS — I10 Essential (primary) hypertension: Secondary | ICD-10-CM | POA: Diagnosis not present

## 2018-02-23 DIAGNOSIS — I10 Essential (primary) hypertension: Secondary | ICD-10-CM | POA: Diagnosis not present

## 2018-03-03 DIAGNOSIS — I10 Essential (primary) hypertension: Secondary | ICD-10-CM | POA: Diagnosis not present

## 2018-03-04 DIAGNOSIS — I10 Essential (primary) hypertension: Secondary | ICD-10-CM | POA: Diagnosis not present

## 2018-03-05 DIAGNOSIS — I10 Essential (primary) hypertension: Secondary | ICD-10-CM | POA: Diagnosis not present

## 2018-03-06 DIAGNOSIS — I10 Essential (primary) hypertension: Secondary | ICD-10-CM | POA: Diagnosis not present

## 2018-03-07 DIAGNOSIS — I10 Essential (primary) hypertension: Secondary | ICD-10-CM | POA: Diagnosis not present

## 2018-03-08 DIAGNOSIS — I10 Essential (primary) hypertension: Secondary | ICD-10-CM | POA: Diagnosis not present

## 2018-03-09 DIAGNOSIS — I10 Essential (primary) hypertension: Secondary | ICD-10-CM | POA: Diagnosis not present

## 2018-03-10 DIAGNOSIS — I10 Essential (primary) hypertension: Secondary | ICD-10-CM | POA: Diagnosis not present

## 2018-03-11 DIAGNOSIS — I10 Essential (primary) hypertension: Secondary | ICD-10-CM | POA: Diagnosis not present

## 2018-03-12 DIAGNOSIS — I10 Essential (primary) hypertension: Secondary | ICD-10-CM | POA: Diagnosis not present

## 2018-03-13 DIAGNOSIS — I10 Essential (primary) hypertension: Secondary | ICD-10-CM | POA: Diagnosis not present

## 2018-03-14 DIAGNOSIS — I10 Essential (primary) hypertension: Secondary | ICD-10-CM | POA: Diagnosis not present

## 2018-03-15 DIAGNOSIS — I10 Essential (primary) hypertension: Secondary | ICD-10-CM | POA: Diagnosis not present

## 2018-03-16 DIAGNOSIS — I10 Essential (primary) hypertension: Secondary | ICD-10-CM | POA: Diagnosis not present

## 2018-03-17 DIAGNOSIS — I10 Essential (primary) hypertension: Secondary | ICD-10-CM | POA: Diagnosis not present

## 2018-03-18 DIAGNOSIS — I10 Essential (primary) hypertension: Secondary | ICD-10-CM | POA: Diagnosis not present

## 2018-03-19 DIAGNOSIS — I10 Essential (primary) hypertension: Secondary | ICD-10-CM | POA: Diagnosis not present

## 2018-03-20 DIAGNOSIS — I10 Essential (primary) hypertension: Secondary | ICD-10-CM | POA: Diagnosis not present

## 2018-03-21 DIAGNOSIS — I10 Essential (primary) hypertension: Secondary | ICD-10-CM | POA: Diagnosis not present

## 2018-03-22 DIAGNOSIS — I10 Essential (primary) hypertension: Secondary | ICD-10-CM | POA: Diagnosis not present

## 2018-03-23 DIAGNOSIS — I10 Essential (primary) hypertension: Secondary | ICD-10-CM | POA: Diagnosis not present

## 2018-03-24 DIAGNOSIS — M25511 Pain in right shoulder: Secondary | ICD-10-CM | POA: Diagnosis not present

## 2018-03-24 DIAGNOSIS — G300 Alzheimer's disease with early onset: Secondary | ICD-10-CM | POA: Diagnosis not present

## 2018-03-24 DIAGNOSIS — M6281 Muscle weakness (generalized): Secondary | ICD-10-CM | POA: Diagnosis not present

## 2018-03-24 DIAGNOSIS — F0391 Unspecified dementia with behavioral disturbance: Secondary | ICD-10-CM | POA: Diagnosis not present

## 2018-03-24 DIAGNOSIS — I10 Essential (primary) hypertension: Secondary | ICD-10-CM | POA: Diagnosis not present

## 2018-03-25 DIAGNOSIS — I10 Essential (primary) hypertension: Secondary | ICD-10-CM | POA: Diagnosis not present

## 2018-03-26 DIAGNOSIS — I10 Essential (primary) hypertension: Secondary | ICD-10-CM | POA: Diagnosis not present

## 2018-03-27 DIAGNOSIS — I10 Essential (primary) hypertension: Secondary | ICD-10-CM | POA: Diagnosis not present

## 2018-03-28 DIAGNOSIS — I10 Essential (primary) hypertension: Secondary | ICD-10-CM | POA: Diagnosis not present

## 2018-03-29 DIAGNOSIS — I10 Essential (primary) hypertension: Secondary | ICD-10-CM | POA: Diagnosis not present

## 2018-03-30 ENCOUNTER — Other Ambulatory Visit: Payer: Self-pay

## 2018-03-30 ENCOUNTER — Encounter (HOSPITAL_COMMUNITY): Payer: Self-pay | Admitting: *Deleted

## 2018-03-30 ENCOUNTER — Emergency Department (HOSPITAL_COMMUNITY): Payer: Medicare HMO

## 2018-03-30 ENCOUNTER — Emergency Department (HOSPITAL_COMMUNITY)
Admission: EM | Admit: 2018-03-30 | Discharge: 2018-03-30 | Disposition: A | Discharge status: Home / Self Care | Payer: Medicare HMO | Attending: Emergency Medicine | Admitting: Emergency Medicine

## 2018-03-30 DIAGNOSIS — S42031A Displaced fracture of lateral end of right clavicle, initial encounter for closed fracture: Secondary | ICD-10-CM | POA: Diagnosis not present

## 2018-03-30 DIAGNOSIS — S42001A Fracture of unspecified part of right clavicle, initial encounter for closed fracture: Secondary | ICD-10-CM

## 2018-03-30 DIAGNOSIS — F0150 Vascular dementia without behavioral disturbance: Secondary | ICD-10-CM | POA: Diagnosis not present

## 2018-03-30 DIAGNOSIS — Y92129 Unspecified place in nursing home as the place of occurrence of the external cause: Secondary | ICD-10-CM | POA: Diagnosis not present

## 2018-03-30 DIAGNOSIS — Z87891 Personal history of nicotine dependence: Secondary | ICD-10-CM | POA: Insufficient documentation

## 2018-03-30 DIAGNOSIS — I129 Hypertensive chronic kidney disease with stage 1 through stage 4 chronic kidney disease, or unspecified chronic kidney disease: Secondary | ICD-10-CM | POA: Diagnosis not present

## 2018-03-30 DIAGNOSIS — R609 Edema, unspecified: Secondary | ICD-10-CM | POA: Diagnosis not present

## 2018-03-30 DIAGNOSIS — R0902 Hypoxemia: Secondary | ICD-10-CM | POA: Diagnosis not present

## 2018-03-30 DIAGNOSIS — M2550 Pain in unspecified joint: Secondary | ICD-10-CM | POA: Diagnosis not present

## 2018-03-30 DIAGNOSIS — N183 Chronic kidney disease, stage 3 (moderate): Secondary | ICD-10-CM | POA: Diagnosis not present

## 2018-03-30 DIAGNOSIS — Y939 Activity, unspecified: Secondary | ICD-10-CM | POA: Diagnosis not present

## 2018-03-30 DIAGNOSIS — Z7982 Long term (current) use of aspirin: Secondary | ICD-10-CM | POA: Insufficient documentation

## 2018-03-30 DIAGNOSIS — M5489 Other dorsalgia: Secondary | ICD-10-CM | POA: Diagnosis not present

## 2018-03-30 DIAGNOSIS — S4991XA Unspecified injury of right shoulder and upper arm, initial encounter: Secondary | ICD-10-CM | POA: Diagnosis present

## 2018-03-30 DIAGNOSIS — W19XXXA Unspecified fall, initial encounter: Secondary | ICD-10-CM | POA: Insufficient documentation

## 2018-03-30 DIAGNOSIS — I10 Essential (primary) hypertension: Secondary | ICD-10-CM | POA: Diagnosis not present

## 2018-03-30 DIAGNOSIS — Z8673 Personal history of transient ischemic attack (TIA), and cerebral infarction without residual deficits: Secondary | ICD-10-CM | POA: Diagnosis not present

## 2018-03-30 DIAGNOSIS — R52 Pain, unspecified: Secondary | ICD-10-CM | POA: Diagnosis not present

## 2018-03-30 DIAGNOSIS — J449 Chronic obstructive pulmonary disease, unspecified: Secondary | ICD-10-CM | POA: Diagnosis not present

## 2018-03-30 DIAGNOSIS — M25519 Pain in unspecified shoulder: Secondary | ICD-10-CM | POA: Diagnosis not present

## 2018-03-30 DIAGNOSIS — S42021A Displaced fracture of shaft of right clavicle, initial encounter for closed fracture: Secondary | ICD-10-CM | POA: Insufficient documentation

## 2018-03-30 DIAGNOSIS — Y998 Other external cause status: Secondary | ICD-10-CM | POA: Diagnosis not present

## 2018-03-30 DIAGNOSIS — S42033A Displaced fracture of lateral end of unspecified clavicle, initial encounter for closed fracture: Secondary | ICD-10-CM | POA: Diagnosis not present

## 2018-03-30 DIAGNOSIS — F0390 Unspecified dementia without behavioral disturbance: Secondary | ICD-10-CM | POA: Diagnosis not present

## 2018-03-30 DIAGNOSIS — Z7401 Bed confinement status: Secondary | ICD-10-CM | POA: Diagnosis not present

## 2018-03-30 DIAGNOSIS — M549 Dorsalgia, unspecified: Secondary | ICD-10-CM

## 2018-03-30 LAB — CBC WITH DIFFERENTIAL/PLATELET
Abs Immature Granulocytes: 0.04 10*3/uL (ref 0.00–0.07)
Basophils Absolute: 0.1 10*3/uL (ref 0.0–0.1)
Basophils Relative: 1 %
EOS PCT: 2 %
Eosinophils Absolute: 0.3 10*3/uL (ref 0.0–0.5)
HEMATOCRIT: 40.1 % (ref 36.0–46.0)
HEMOGLOBIN: 13.1 g/dL (ref 12.0–15.0)
Immature Granulocytes: 0 %
LYMPHS ABS: 2.5 10*3/uL (ref 0.7–4.0)
LYMPHS PCT: 21 %
MCH: 29.3 pg (ref 26.0–34.0)
MCHC: 32.7 g/dL (ref 30.0–36.0)
MCV: 89.7 fL (ref 80.0–100.0)
MONO ABS: 1.2 10*3/uL — AB (ref 0.1–1.0)
Monocytes Relative: 10 %
Neutro Abs: 7.7 10*3/uL (ref 1.7–7.7)
Neutrophils Relative %: 66 %
Platelets: 383 10*3/uL (ref 150–400)
RBC: 4.47 MIL/uL (ref 3.87–5.11)
RDW: 13.7 % (ref 11.5–15.5)
WBC: 11.8 10*3/uL — AB (ref 4.0–10.5)
nRBC: 0 % (ref 0.0–0.2)

## 2018-03-30 LAB — URINALYSIS, ROUTINE W REFLEX MICROSCOPIC
BILIRUBIN URINE: NEGATIVE
Glucose, UA: NEGATIVE mg/dL
HGB URINE DIPSTICK: NEGATIVE
Ketones, ur: NEGATIVE mg/dL
Leukocytes, UA: NEGATIVE
Nitrite: NEGATIVE
Protein, ur: NEGATIVE mg/dL
SPECIFIC GRAVITY, URINE: 1.011 (ref 1.005–1.030)
pH: 6 (ref 5.0–8.0)

## 2018-03-30 LAB — COMPREHENSIVE METABOLIC PANEL
ALK PHOS: 78 U/L (ref 38–126)
ALT: 18 U/L (ref 0–44)
ANION GAP: 9 (ref 5–15)
AST: 21 U/L (ref 15–41)
Albumin: 3.5 g/dL (ref 3.5–5.0)
BILIRUBIN TOTAL: 0.6 mg/dL (ref 0.3–1.2)
BUN: 15 mg/dL (ref 8–23)
CHLORIDE: 103 mmol/L (ref 98–111)
CO2: 21 mmol/L — ABNORMAL LOW (ref 22–32)
CREATININE: 0.83 mg/dL (ref 0.44–1.00)
Calcium: 9.4 mg/dL (ref 8.9–10.3)
GFR calc non Af Amer: >60 mL/min (ref >60)
Glucose, Bld: 117 mg/dL — ABNORMAL HIGH (ref 70–99)
Potassium: 4.2 mmol/L (ref 3.5–5.1)
Sodium: 133 mmol/L — ABNORMAL LOW (ref 135–145)
Total Protein: 6.6 g/dL (ref 6.5–8.1)

## 2018-03-30 MED ORDER — HYDROCODONE-ACETAMINOPHEN 5-325 MG PO TABS: 1.0000 | ORAL_TABLET | Freq: Four times a day (QID) | ORAL | 0 refills | Status: DC | PRN

## 2018-03-30 NOTE — ED Notes (Signed)
PT DC with [facility]

## 2018-03-30 NOTE — ED Triage Notes (Signed)
PT from [facility] with a reported fall 4 days ago. Pt continues to have pain to RT shoulder .Brusing observed to RT shoulder and RT clavicle region.  Pt is alert but confused at base line. EMS also reported Pt has a dry cough .

## 2018-03-30 NOTE — ED Notes (Signed)
Transported to xray 

## 2018-03-30 NOTE — ED Notes (Signed)
Urine/ Culture sent to lab.  

## 2018-03-30 NOTE — Discharge Instructions (Signed)
It was my pleasure taking care of you today!   You have broken your collar bone. You were placed in a shoulder immobilizer. You should wear this at all times until you see your primary care doctor or orthopedist.   Call the orthopedic doctor listed to schedule a follow up appointment.   Return to ER for new or worsening symptoms, any additional concerns.

## 2018-03-30 NOTE — ED Provider Notes (Signed)
[facility] EMERGENCY DEPARTMENT Provider Note   CSN: [id number] Arrival date & time: 03/30/18  1303     History   Chief Complaint Chief Complaint  Patient presents with  . Shoulder Pain    HPI [name] is a 73 y.o. female.  The history is provided by the patient, medical records and the nursing home. No language interpreter was used.  Shoulder Pain     [name] is a 73 y.o. female  with a PMH as listed below who presents to the Emergency Department from memory care facility for evaluation after fall which reportedly happened four days ago. Unfortunately, patient cannot provide any details from the fall and majority of history is obtained from nursing home / ems report. Patient apparently fell four days ago, striking her right shoulder. X-ray was done at the time, but still no report from radiology has been sent to facility. They were trying to transfer her today and noticed increased discomfort to the shoulder, prompting them to send her to ER for evaluation. They also note that she has had a dry cough for several days as well. No known fever or other symptoms.   Level V caveat applies 2/2 baseline mental status   Past Medical History:  Diagnosis Date  . Carotid artery aneurysm ([place]) 03/04/2012  . COPD (chronic obstructive pulmonary disease) ([place])   . Depression   . DJD (degenerative joint disease)   . GERD (gastroesophageal reflux disease)   . Hypertension   . Stroke [facility])    was on Plavix - not now    Patient Active Problem List   Diagnosis Date Noted  . Vascular dementia without behavioral disturbance ([place]) 06/30/2016  . Disease ruled out after examination   . Aphasia 05/15/2016  . Atherosclerosis of left carotid artery   . Cerebral embolism with cerebral infarction 02/27/2016  . Acute encephalopathy 02/26/2016  . Osteoporosis 07/04/2015  . Impaired cognition 03/30/2014  . Vitamin D deficiency 08/18/2013  . Hyperlipidemia 08/18/2013    . Prediabetes 08/18/2013  . Medication management 08/18/2013  . CKD (chronic kidney disease) stage 3, GFR 30-59 ml/min ([name]) 07/01/2013  . Anemia of chronic disease 07/01/2013  . Depression, major, in remission ([place]) 06/30/2013  . Essential hypertension 02/09/2013  . COPD GOLD III 03/28/2012  . Vocal cord dysfunction 03/28/2012  . Carotid artery aneurysm ([place]) 03/04/2012  . Smoker 03/04/2012    Past Surgical History:  Procedure Laterality Date  . CATARACT EXTRACTION Bilateral   . COLONOSCOPY  2004  . HERNIA REPAIR Right 2014   inguinal  . KYPHOPLASTY N/A 10/28/2014   Procedure: KYPHOPLASTY;  Surgeon: Phylliss Bob, MD;  Location: [place];  Service: Orthopedics;  Laterality: N/A;  Lumbar 1 kyphoplasty  . MICROLARYNGOSCOPY WITH CO2 LASER AND EXCISION OF VOCAL CORD LESION N/A 06/05/2013   Procedure: MICROLARYNGOSCOPY WITH CO2 LASER AND EXCISION OF VOCAL CORD POLYPS;  Surgeon: Melida Quitter, MD;  Location: [place];  Service: ENT;  Laterality: N/A;  . TONSILLECTOMY    . TUBAL LIGATION  30 years  . VIDEO BRONCHOSCOPY  04/24/2012   Procedure: VIDEO BRONCHOSCOPY WITHOUT FLUORO;  Surgeon: Rigoberto Noel, MD;  Location: [place];  Service: Cardiopulmonary;  Laterality: Bilateral;     OB History   No obstetric history on file.      Home Medications    Prior to Admission medications   Medication Sig Start Date End Date Taking? Authorizing Provider  amLODipine (NORVASC) 5 MG tablet Take 5 mg  by mouth daily. 06/12/16   [provider]  aspirin 325 MG tablet Take 1 tablet (325 mg total) by mouth daily. Patient not taking: Reported on 06/29/2016 02/29/16   Reyne Dumas, MD  atorvastatin (LIPITOR) 20 MG tablet Take 1 tablet (20 mg total) by mouth daily at 6 PM. 02/29/16   Reyne Dumas, MD  b complex vitamins tablet Take 1 tablet by mouth daily. 01/25/16   Rolene Course, PA-C  Cholecalciferol (VITAMIN D3) 5000 units TABS Take 5,000 Units by mouth daily.    [provider]   docusate sodium (COLACE) 100 MG capsule Take 1 capsule (100 mg total) by mouth every 12 (twelve) hours. 04/26/16   Ward, Delice Bison, DO  gabapentin (NEURONTIN) 300 MG capsule Take 300 mg by mouth at bedtime. 06/12/16   [provider]  gabapentin (NEURONTIN) 600 MG tablet TAKE 1/2 TO 1 TABLET BY MOUTH THREE TIMES DAILY AS DIRECTED Patient not taking: Reported on 06/29/2016 04/09/16   Unk Pinto, MD  HYDROcodone-acetaminophen (NORCO/VICODIN) 5-325 MG tablet Take 1 tablet by mouth every 6 (six) hours as needed for severe pain. 03/30/18   Ward, Ozella Almond, PA-C  LORazepam (ATIVAN) 0.5 MG tablet Take 1 tablet (0.5 mg total) by mouth 2 (two) times daily as needed for anxiety. 02/29/16   Reyne Dumas, MD  LORazepam (ATIVAN) 2 MG/ML injection Inject 1 mg into the muscle 2 (two) times daily as needed. For agitation 06/28/16   [provider]  losartan (COZAAR) 100 MG tablet Take 100 mg by mouth daily. 06/13/16   [provider]  Magnesium 500 MG TABS Take 500 mg tablet twice daily with meals. 01/25/16   Rolene Course, PA-C  mirtazapine (REMERON) 30 MG tablet Take 30 mg by mouth at bedtime. 06/28/16   [provider]  oxybutynin (DITROPAN) 5 MG tablet TAKE 1/2 TO 1 TABLET BY MOUTH THREE TIMES DAILY FOR BLADDER CONTROL Patient taking differently: TAKE 1 TABLET BY MOUTH THREE TIMES DAILY FOR BLADDER CONTROL 04/23/16   Unk Pinto, MD  pantoprazole (PROTONIX) 40 MG tablet Take 1 tablet (40 mg total) by mouth daily. 02/29/16   Reyne Dumas, MD  QUEtiapine (SEROQUEL) 300 MG tablet Take 300 mg by mouth 2 (two) times daily. 06/28/16   [provider]  QUEtiapine (SEROQUEL) 400 MG tablet TAKE 1 TABLET BY MOUTH EVERY NIGHT AT BEDTIME 04/09/16   Unk Pinto, MD  ranitidine (ZANTAC) 300 MG tablet TAKE 1 TABLET(300 MG) BY MOUTH TWICE DAILY Patient not taking: Reported on 06/29/2016 04/23/16   Unk Pinto, MD  traMADol (ULTRAM) 50 MG tablet Take 1 tablet (50 mg total) by  mouth 2 (two) times daily as needed. Patient taking differently: Take 50 mg by mouth every 6 (six) hours as needed (for pain).  02/29/16   Reyne Dumas, MD  vitamin C (ASCORBIC ACID) 500 MG tablet Take 500 mg by mouth daily.    [provider]    Family History Family History  Problem Relation Age of Onset  . Breast cancer Mother   . Breast cancer Sister     Social History Social History   Tobacco Use  . Smoking status: Former Smoker    Packs/day: 0.25    Years: 30.00    Pack years: 7.50    Types: Cigarettes  . Smokeless tobacco: Never Used  Substance Use Topics  . Alcohol use: No  . Drug use: No     Allergies   Amoxapine and related; Amoxicillin; Ciprocinonide [fluocinolone]; Ciprofloxacin; Doxycycline; Erythromycin; Sulfa  antibiotics; and Zofran [ondansetron hcl]   Review of Systems Review of Systems  Unable to perform ROS: Dementia  Musculoskeletal: Positive for arthralgias and myalgias.  Skin: Positive for color change.     Physical Exam Updated Vital Signs BP 122/74   Pulse 98   Temp 98.4 F (36.9 C) (Oral)   Resp 16   SpO2 95%   Physical Exam Vitals signs and nursing note reviewed.  Constitutional:      General: She is not in acute distress.    Appearance: She is well-developed.  HENT:     Head: Normocephalic and atraumatic.  Neck:     Comments: No midline or paraspinal tenderness.  Full range of motion without any discomfort. Cardiovascular:     Rate and Rhythm: Normal rate and regular rhythm.     Heart sounds: Normal heart sounds. No murmur.  Pulmonary:     Effort: Pulmonary effort is normal. No respiratory distress.     Breath sounds: Normal breath sounds.  Abdominal:     General: There is no distension.     Palpations: Abdomen is soft.     Tenderness: There is no abdominal tenderness.  Musculoskeletal:     Comments: Tenderness to anterior right shoulder and clavicle with overlying ecchymosis.  No break in the skin.  No crepitus.   Good grip strength bilaterally.  Plus radial pulses bilaterally.  Skin:    General: Skin is warm and dry.  Neurological:     Mental Status: She is alert.     Comments: Alert. Orient to person and place. Baseline mental status per facility.       ED Treatments / Results  Labs (all labs ordered are listed, but only abnormal results are displayed) Labs Reviewed  CBC WITH DIFFERENTIAL/PLATELET - Abnormal; Notable for the following components:      Result Value   WBC 11.8 (*)    Monocytes Absolute 1.2 (*)    All other components within normal limits  COMPREHENSIVE METABOLIC PANEL - Abnormal; Notable for the following components:   Sodium 133 (*)    CO2 21 (*)    Glucose, Bld 117 (*)    All other components within normal limits  URINALYSIS, ROUTINE W REFLEX MICROSCOPIC    EKG None  Radiology Dg Chest 2 View  Result Date: 03/30/2018 CLINICAL DATA:  73 y/o F; fall 4 days ago. Pain to the anterior right shoulder. EXAM: CHEST - 2 VIEW COMPARISON:  06/30/2016 chest radiograph. FINDINGS: Normal cardiac silhouette. Aortic atherosclerosis with calcification. Stable hyperinflation and biapical pleuroparenchymal scarring. No focal consolidation. No pleural effusion or pneumothorax. Stable midthoracic and upper lumbar chronic compression deformities post lumbar augmentation. Acute comminuted mildly displaced lateral clavicle shaft fracture. IMPRESSION: 1. No acute pulmonary process identified. 2. Acute comminuted mildly displaced lateral clavicle shaft fracture. Electronically Signed   By: Kristine Garbe M.D.   On: 03/30/2018 14:19   Dg Shoulder Right  Result Date: 03/30/2018 CLINICAL DATA:  73 y/o  F; fall with right shoulder pain. EXAM: RIGHT SHOULDER - 2+ VIEW COMPARISON:  None. FINDINGS: Acute comminuted and mildly displaced lateral clavicle shaft fracture. Normal acromioclavicular and coracoclavicular intervals. No additional fracture or dislocation identified. IMPRESSION: Acute  comminuted and mildly displaced lateral clavicle shaft fracture. Electronically Signed   By: Kristine Garbe M.D.   On: 03/30/2018 14:20    Procedures Procedures (including critical care time)  Medications Ordered in ED Medications - No data to display   Initial Impression / Assessment and Plan /  ED Course  I have reviewed the triage vital signs and the nursing notes.  Pertinent labs & imaging results that were available during my care of the patient were reviewed by me and considered in my medical decision making (see chart for details).    [name] is a 73 y.o. female who presents to ED from facility after a fall 4 days ago.  Due to her baseline mental status, patient unable to provide any details about the fall itself.  She does have bruising and tenderness to the anterior right shoulder and clavicular region.  UA without signs of infection.  Labs reviewed: Baseline hyponatremia at 133, mild leukocytosis of 11.8.  X-rays show acute comminuted mildly displaced lateral clavicular shaft fracture. Placed in shoulder immobilizer while in the emergency department. PCP and ortho follow up recommended. Short course pain medication provided.   Patient discussed with Dr. Lita Mains who agrees with treatment plan.     Final Clinical Impressions(s) / ED Diagnoses   Final diagnoses:  Fall  Closed displaced fracture of right clavicle, unspecified part of clavicle, initial encounter    ED Discharge Orders         Ordered    HYDROcodone-acetaminophen (NORCO/VICODIN) 5-325 MG tablet  Every 6 hours PRN     03/30/18 1446           Ward, Ozella Almond, PA-C 03/30/18 1513    Julianne Rice, MD 03/31/18 1745

## 2018-03-30 NOTE — ED Notes (Signed)
[facility] CALLED  °

## 2018-03-31 DIAGNOSIS — S42024A Nondisplaced fracture of shaft of right clavicle, initial encounter for closed fracture: Secondary | ICD-10-CM | POA: Diagnosis not present

## 2018-03-31 DIAGNOSIS — G300 Alzheimer's disease with early onset: Secondary | ICD-10-CM | POA: Diagnosis not present

## 2018-03-31 DIAGNOSIS — F0281 Dementia in other diseases classified elsewhere with behavioral disturbance: Secondary | ICD-10-CM | POA: Diagnosis not present

## 2018-03-31 DIAGNOSIS — I10 Essential (primary) hypertension: Secondary | ICD-10-CM | POA: Diagnosis not present

## 2018-03-31 DIAGNOSIS — M6281 Muscle weakness (generalized): Secondary | ICD-10-CM | POA: Diagnosis not present

## 2018-04-01 DIAGNOSIS — R404 Transient alteration of awareness: Secondary | ICD-10-CM | POA: Diagnosis not present

## 2018-04-01 DIAGNOSIS — R0902 Hypoxemia: Secondary | ICD-10-CM | POA: Diagnosis not present

## 2018-04-01 DIAGNOSIS — I10 Essential (primary) hypertension: Secondary | ICD-10-CM | POA: Diagnosis not present

## 2018-04-01 DIAGNOSIS — R58 Hemorrhage, not elsewhere classified: Secondary | ICD-10-CM | POA: Diagnosis not present

## 2018-04-01 DIAGNOSIS — S59919A Unspecified injury of unspecified forearm, initial encounter: Secondary | ICD-10-CM | POA: Diagnosis not present

## 2018-04-01 DIAGNOSIS — W19XXXA Unspecified fall, initial encounter: Secondary | ICD-10-CM | POA: Diagnosis not present

## 2018-04-02 ENCOUNTER — Emergency Department (HOSPITAL_COMMUNITY): Payer: Medicare HMO

## 2018-04-02 ENCOUNTER — Emergency Department (HOSPITAL_COMMUNITY)
Admission: EM | Admit: 2018-04-02 | Discharge: 2018-04-02 | Disposition: A | Discharge status: Home / Self Care | Payer: Medicare HMO | Source: Home / Self Care | Attending: Emergency Medicine | Admitting: Emergency Medicine

## 2018-04-02 ENCOUNTER — Encounter (HOSPITAL_COMMUNITY): Payer: Self-pay | Admitting: Emergency Medicine

## 2018-04-02 DIAGNOSIS — R0902 Hypoxemia: Secondary | ICD-10-CM | POA: Diagnosis not present

## 2018-04-02 DIAGNOSIS — Z881 Allergy status to other antibiotic agents status: Secondary | ICD-10-CM | POA: Diagnosis not present

## 2018-04-02 DIAGNOSIS — R000 Tachycardia, unspecified: Secondary | ICD-10-CM | POA: Diagnosis not present

## 2018-04-02 DIAGNOSIS — I959 Hypotension, unspecified: Secondary | ICD-10-CM | POA: Diagnosis not present

## 2018-04-02 DIAGNOSIS — S0101XA Laceration without foreign body of scalp, initial encounter: Secondary | ICD-10-CM | POA: Insufficient documentation

## 2018-04-02 DIAGNOSIS — J439 Emphysema, unspecified: Secondary | ICD-10-CM | POA: Diagnosis not present

## 2018-04-02 DIAGNOSIS — S0003XA Contusion of scalp, initial encounter: Secondary | ICD-10-CM | POA: Diagnosis not present

## 2018-04-02 DIAGNOSIS — Z79899 Other long term (current) drug therapy: Secondary | ICD-10-CM | POA: Insufficient documentation

## 2018-04-02 DIAGNOSIS — A419 Sepsis, unspecified organism: Secondary | ICD-10-CM | POA: Diagnosis not present

## 2018-04-02 DIAGNOSIS — J209 Acute bronchitis, unspecified: Secondary | ICD-10-CM | POA: Diagnosis not present

## 2018-04-02 DIAGNOSIS — Z888 Allergy status to other drugs, medicaments and biological substances status: Secondary | ICD-10-CM | POA: Diagnosis not present

## 2018-04-02 DIAGNOSIS — J9811 Atelectasis: Secondary | ICD-10-CM | POA: Diagnosis not present

## 2018-04-02 DIAGNOSIS — S52502D Unspecified fracture of the lower end of left radius, subsequent encounter for closed fracture with routine healing: Secondary | ICD-10-CM

## 2018-04-02 DIAGNOSIS — Z882 Allergy status to sulfonamides status: Secondary | ICD-10-CM | POA: Diagnosis not present

## 2018-04-02 DIAGNOSIS — Y999 Unspecified external cause status: Secondary | ICD-10-CM

## 2018-04-02 DIAGNOSIS — J9601 Acute respiratory failure with hypoxia: Secondary | ICD-10-CM | POA: Diagnosis not present

## 2018-04-02 DIAGNOSIS — Z66 Do not resuscitate: Secondary | ICD-10-CM | POA: Diagnosis present

## 2018-04-02 DIAGNOSIS — K5641 Fecal impaction: Secondary | ICD-10-CM | POA: Diagnosis not present

## 2018-04-02 DIAGNOSIS — G459 Transient cerebral ischemic attack, unspecified: Secondary | ICD-10-CM | POA: Diagnosis not present

## 2018-04-02 DIAGNOSIS — Z8673 Personal history of transient ischemic attack (TIA), and cerebral infarction without residual deficits: Secondary | ICD-10-CM | POA: Diagnosis not present

## 2018-04-02 DIAGNOSIS — D649 Anemia, unspecified: Secondary | ICD-10-CM | POA: Diagnosis not present

## 2018-04-02 DIAGNOSIS — S42021D Displaced fracture of shaft of right clavicle, subsequent encounter for fracture with routine healing: Secondary | ICD-10-CM | POA: Diagnosis not present

## 2018-04-02 DIAGNOSIS — F0150 Vascular dementia without behavioral disturbance: Secondary | ICD-10-CM | POA: Diagnosis not present

## 2018-04-02 DIAGNOSIS — Z7401 Bed confinement status: Secondary | ICD-10-CM | POA: Diagnosis not present

## 2018-04-02 DIAGNOSIS — S0181XA Laceration without foreign body of other part of head, initial encounter: Secondary | ICD-10-CM | POA: Diagnosis not present

## 2018-04-02 DIAGNOSIS — J9621 Acute and chronic respiratory failure with hypoxia: Secondary | ICD-10-CM | POA: Diagnosis not present

## 2018-04-02 DIAGNOSIS — Y939 Activity, unspecified: Secondary | ICD-10-CM | POA: Insufficient documentation

## 2018-04-02 DIAGNOSIS — R6889 Other general symptoms and signs: Secondary | ICD-10-CM | POA: Diagnosis not present

## 2018-04-02 DIAGNOSIS — I10 Essential (primary) hypertension: Secondary | ICD-10-CM

## 2018-04-02 DIAGNOSIS — Z515 Encounter for palliative care: Secondary | ICD-10-CM | POA: Diagnosis present

## 2018-04-02 DIAGNOSIS — Y92129 Unspecified place in nursing home as the place of occurrence of the external cause: Secondary | ICD-10-CM | POA: Insufficient documentation

## 2018-04-02 DIAGNOSIS — J449 Chronic obstructive pulmonary disease, unspecified: Secondary | ICD-10-CM | POA: Insufficient documentation

## 2018-04-02 DIAGNOSIS — W19XXXA Unspecified fall, initial encounter: Secondary | ICD-10-CM

## 2018-04-02 DIAGNOSIS — Z7902 Long term (current) use of antithrombotics/antiplatelets: Secondary | ICD-10-CM | POA: Diagnosis not present

## 2018-04-02 DIAGNOSIS — S0083XA Contusion of other part of head, initial encounter: Secondary | ICD-10-CM

## 2018-04-02 DIAGNOSIS — Z87891 Personal history of nicotine dependence: Secondary | ICD-10-CM

## 2018-04-02 DIAGNOSIS — S52502A Unspecified fracture of the lower end of left radius, initial encounter for closed fracture: Secondary | ICD-10-CM

## 2018-04-02 DIAGNOSIS — N179 Acute kidney failure, unspecified: Secondary | ICD-10-CM | POA: Diagnosis not present

## 2018-04-02 DIAGNOSIS — M2550 Pain in unspecified joint: Secondary | ICD-10-CM | POA: Diagnosis not present

## 2018-04-02 DIAGNOSIS — R296 Repeated falls: Secondary | ICD-10-CM | POA: Diagnosis present

## 2018-04-02 DIAGNOSIS — K5900 Constipation, unspecified: Secondary | ICD-10-CM | POA: Diagnosis not present

## 2018-04-02 DIAGNOSIS — J111 Influenza due to unidentified influenza virus with other respiratory manifestations: Secondary | ICD-10-CM | POA: Diagnosis not present

## 2018-04-02 DIAGNOSIS — R4182 Altered mental status, unspecified: Secondary | ICD-10-CM | POA: Diagnosis not present

## 2018-04-02 DIAGNOSIS — K219 Gastro-esophageal reflux disease without esophagitis: Secondary | ICD-10-CM | POA: Diagnosis present

## 2018-04-02 HISTORY — DX: Vascular dementia, unspecified severity, without behavioral disturbance, psychotic disturbance, mood disturbance, and anxiety: F01.50

## 2018-04-02 MED ORDER — ACETAMINOPHEN 325 MG PO TABS
650.0000 mg | ORAL_TABLET | Freq: Once | ORAL | Status: AC
  Administered 2018-04-02: 650 mg via ORAL
  Filled 2018-04-02: qty 2

## 2018-04-02 NOTE — ED Provider Notes (Signed)
[facility] DEPT Provider Note: Raven Spurling, MD, FACEP  CSN: [id number] MRN: [id number] ARRIVAL: 04/02/18 at [place]: [place]  Fall  Level 5 caveat: Dementia HISTORY OF PRESENT ILLNESS  04/02/18 12:47 AM [name] is a 73 y.o. female who had an unwitnessed fall late yesterday evening at her living facility.  She has a laceration to the back of her head and has a tender deformity of the left wrist.  She has a history of falls and has an old right clavicle injury and healing bruises of the right forehead.    Past Medical History:  Diagnosis Date  . Carotid artery aneurysm ([place]) 03/04/2012  . COPD (chronic obstructive pulmonary disease) ([place])   . Depression   . DJD (degenerative joint disease)   . GERD (gastroesophageal reflux disease)   . Hypertension   . Stroke [facility])    was on Plavix - not now  . Vascular dementia [facility])     Past Surgical History:  Procedure Laterality Date  . CATARACT EXTRACTION Bilateral   . COLONOSCOPY  2004  . HERNIA REPAIR Right 2014   inguinal  . KYPHOPLASTY N/A 10/28/2014   Procedure: KYPHOPLASTY;  Surgeon: Phylliss Bob, MD;  Location: [place];  Service: Orthopedics;  Laterality: N/A;  Lumbar 1 kyphoplasty  . MICROLARYNGOSCOPY WITH CO2 LASER AND EXCISION OF VOCAL CORD LESION N/A 06/05/2013   Procedure: MICROLARYNGOSCOPY WITH CO2 LASER AND EXCISION OF VOCAL CORD POLYPS;  Surgeon: Melida Quitter, MD;  Location: [place];  Service: ENT;  Laterality: N/A;  . TONSILLECTOMY    . TUBAL LIGATION  30 years  . VIDEO BRONCHOSCOPY  04/24/2012   Procedure: VIDEO BRONCHOSCOPY WITHOUT FLUORO;  Surgeon: Rigoberto Noel, MD;  Location: [place];  Service: Cardiopulmonary;  Laterality: Bilateral;    Family History  Problem Relation Age of Onset  . Breast cancer Mother   . Breast cancer Sister     Social History   Tobacco Use  . Smoking status: Former Smoker    Packs/day: 0.25    Years: 30.00    Pack years: 7.50    Types: Cigarettes   . Smokeless tobacco: Never Used  Substance Use Topics  . Alcohol use: No  . Drug use: No    Prior to Admission medications   Medication Sig Start Date End Date Taking? Authorizing Provider  acetaminophen (TYLENOL) 500 MG tablet Take 500 mg by mouth every 4 (four) hours as needed for mild pain.   Yes [provider]  amLODipine (NORVASC) 5 MG tablet Take 5 mg by mouth daily. 06/12/16  Yes [provider]  atorvastatin (LIPITOR) 20 MG tablet Take 1 tablet (20 mg total) by mouth daily at 6 PM. 02/29/16  Yes Reyne Dumas, MD  b complex vitamins tablet Take 1 tablet by mouth daily. 01/25/16  Yes Rolene Course, PA-C  clopidogrel (PLAVIX) 75 MG tablet Take 75 mg by mouth daily.   Yes [provider]  gabapentin (NEURONTIN) 300 MG capsule Take 300 mg by mouth at bedtime. 06/12/16  Yes [provider]  guaiFENesin (ROBITUSSIN) 100 MG/5ML SOLN Take 10 mLs by mouth every 6 (six) hours as needed for cough or to loosen phlegm.   Yes [provider]  losartan (COZAAR) 100 MG tablet Take 100 mg by mouth daily. 06/13/16  Yes [provider]  mirtazapine (REMERON) 30 MG tablet Take 30 mg by mouth at bedtime. 06/28/16  Yes [provider]  OLANZapine (ZYPREXA) 15 MG tablet  Take 7 mg by mouth 2 (two) times daily.   Yes [provider]  oxybutynin (DITROPAN) 5 MG tablet TAKE 1/2 TO 1 TABLET BY MOUTH THREE TIMES DAILY FOR BLADDER CONTROL 04/23/16  Yes Unk Pinto, MD  traMADol (ULTRAM) 50 MG tablet Take 1 tablet (50 mg total) by mouth 2 (two) times daily as needed. Patient taking differently: Take 50 mg by mouth 3 (three) times daily.  02/29/16  Yes Reyne Dumas, MD  trolamine salicylate (ASPERCREME) 10 % cream Apply 1 application topically as needed for muscle pain.   Yes [provider]  vitamin C (ASCORBIC ACID) 500 MG tablet Take 500 mg by mouth daily.   Yes [provider]  aspirin 325 MG tablet Take 1 tablet (325 mg  total) by mouth daily. Patient not taking: Reported on 06/29/2016 02/29/16   Reyne Dumas, MD  docusate sodium (COLACE) 100 MG capsule Take 1 capsule (100 mg total) by mouth every 12 (twelve) hours. Patient not taking: Reported on 04/02/2018 04/26/16   Ward, Delice Bison, DO  gabapentin (NEURONTIN) 600 MG tablet TAKE 1/2 TO 1 TABLET BY MOUTH THREE TIMES DAILY AS DIRECTED Patient not taking: Reported on 06/29/2016 04/09/16   Unk Pinto, MD  HYDROcodone-acetaminophen (NORCO/VICODIN) 5-325 MG tablet Take 1 tablet by mouth every 6 (six) hours as needed for severe pain. Patient not taking: Reported on 04/02/2018 03/30/18   Ward, Ozella Almond, PA-C  LORazepam (ATIVAN) 0.5 MG tablet Take 1 tablet (0.5 mg total) by mouth 2 (two) times daily as needed for anxiety. Patient not taking: Reported on 04/02/2018 02/29/16   Reyne Dumas, MD  Magnesium 500 MG TABS Take 500 mg tablet twice daily with meals. Patient not taking: Reported on 04/02/2018 01/25/16   Rolene Course, PA-C  pantoprazole (PROTONIX) 40 MG tablet Take 1 tablet (40 mg total) by mouth daily. Patient not taking: Reported on 04/02/2018 02/29/16   Reyne Dumas, MD  QUEtiapine (SEROQUEL) 400 MG tablet TAKE 1 TABLET BY MOUTH EVERY NIGHT AT BEDTIME Patient not taking: Reported on 04/02/2018 04/09/16   Unk Pinto, MD  ranitidine (ZANTAC) 300 MG tablet TAKE 1 TABLET(300 MG) BY MOUTH TWICE DAILY Patient not taking: Reported on 06/29/2016 04/23/16   Unk Pinto, MD    Allergies Amoxapine and related; Amoxicillin; Ciprocinonide [fluocinolone]; Ciprofloxacin; Doxycycline; Erythromycin; Sulfa antibiotics; and Zofran [ondansetron hcl]   REVIEW OF SYSTEMS     PHYSICAL EXAMINATION  Initial Vital Signs Blood pressure 117/77, pulse (!) 103, resp. rate 18, SpO2 94 %.  Examination General: Well-developed, well-nourished female in no acute distress; appearance consistent with age of record HENT: normocephalic; laceration to occiput with underlying hematoma;  healing ecchymosis of right side of face Eyes: pupils equal, round and reactive to light; extraocular muscles grossly intact; arcus senilis bilaterally Neck: supple; nontender Heart: regular rate and rhythm Lungs: clear to auscultation bilaterally Abdomen: soft; nondistended; nontender; bowel sounds present Extremities: Right upper extremity in sling, healing ecchymoses over right clavicle; tender deformity left wrist, fingers of left hand neurovascularly intact; no pain on passive movement of lower extremities Neurologic: Awake, alert; motor function intact in all extremities but limited ability to to assess symmetry due to injuries dementia; no facial droop Skin: Warm and dry Psychiatric: Flat affect   RESULTS  Summary of this visit's results, reviewed by myself:   EKG Interpretation  Date/Time:    Ventricular Rate:    PR Interval:    QRS Duration:   QT Interval:    QTC Calculation:   R Axis:  Text Interpretation:        Laboratory Studies: No results found for this or any previous visit (from the past 24 hour(s)). Imaging Studies: Dg Wrist Complete Left  Result Date: 04/02/2018 CLINICAL DATA:  Unwitnessed fall this morning with left wrist deformity. EXAM: LEFT WRIST - COMPLETE 3+ VIEW COMPARISON:  Radiographs 04/24/2016 FINDINGS: Remote distal radius fracture has healed. Mild residual posttraumatic deformity. Disruption of the distal radioulnar joint with ulna positive variance appears chronic and similar to prior. No acute fracture. The bones are under mineralized. IMPRESSION: 1. No acute fracture of the left wrist. 2. Remote distal radius fracture with healed posttraumatic deformity. Ulna positive variance was seen on prior exam. Electronically Signed   By: Keith Rake M.D.   On: 04/02/2018 01:45   Ct Head Wo Contrast  Result Date: 04/02/2018 CLINICAL DATA:  Post unwitnessed fall with laceration to posterior head. Head trauma, intracranial venous injury suspected EXAM:  CT HEAD WITHOUT CONTRAST TECHNIQUE: Contiguous axial images were obtained from the base of the skull through the vertex without intravenous contrast. COMPARISON:  Head CT 06/29/2016 FINDINGS: Brain: No intracranial hemorrhage, mass effect, or midline shift. Stable degree of atrophy and chronic small vessel ischemia. Remote left temporoparietal infarcts with associated ex vacuo dilatation of the left lateral ventricle. Remote lacunar infarcts in bilateral basal ganglia. No evidence of territorial infarct or acute ischemia. No extra-axial or intracranial fluid collection. Vascular: Atherosclerosis of skullbase vasculature without hyperdense vessel or abnormal calcification. Skull: No fracture or focal lesion. Sinuses/Orbits: Paranasal sinuses and mastoid air cells are clear. The visualized orbits are unremarkable. Bilateral cataract resection. Other: Small posterior left parietal scalp hematoma. IMPRESSION: 1. Small posterior left parietal scalp hematoma. No acute intracranial abnormality. No skull fracture. 2. Stable atrophy and chronic small vessel ischemia. Unchanged remote infarcts in left MCA distribution and bilateral basal ganglia. Electronically Signed   By: Keith Rake M.D.   On: 04/02/2018 01:52    ED COURSE and MDM  Nursing notes and initial vitals signs, including pulse oximetry, reviewed.  Vitals:   04/02/18 0041 04/02/18 0045  BP: 117/77   Pulse: (!) 103   Resp: 18   SpO2: 94% 94%   Will place patient's left wrist in splint for comfort although this appears to be an old healed distal radial fracture.  PROCEDURES   LACERATION REPAIR Performed by: Karen Chafe Rebbecca Osuna Authorized by: Karen Chafe Berel Najjar Consent: Verbal consent obtained. Risks and benefits: risks, benefits and alternatives were discussed Consent given by: patient Patient identity confirmed: provided demographic data Prepped and Draped in normal sterile fashion Wound explored  Laceration Location: Occiput  Laceration  Length: 0.5 cm  No Foreign Bodies seen or palpated  Anesthesia: None  Irrigation method: syringe Amount of cleaning: standard  Skin closure: Staples (2)  Patient tolerance: Patient tolerated the procedure well with no immediate complications.   ED DIAGNOSES     ICD-10-CM   1. Fall at nursing home, initial encounter W19.XXXA    Y92.129   2. Closed fracture of distal end of left radius with routine healing, unspecified fracture morphology, subsequent encounter S52.502D   3. Laceration of occipital scalp, initial encounter S01.01XA   4. Traumatic hematoma of occiput, initial encounter S00.83XA        Lannie Heaps, Jenny Reichmann, MD 04/02/18 [phone]

## 2018-04-02 NOTE — ED Triage Notes (Signed)
Pt comes to ed via ems, c/o fall unwitnessed 23: 46 tonight. Pt has dementia and confusion at baseline, ptt alter to year and location but knows her birth date, and knows her name.  V/s 120/62, hr 110, r16, spo2 94.  Lac to back of head, complain of leftside ulnar wrist area.  Pt has a hx of falls, has old clavicle injury on right side shoulder, with sling, past head bruises noted right side forehead.

## 2018-04-02 NOTE — ED Notes (Signed)
Pt up for d/c  Called [facility]  Paper work done  [facility] removed staples in 7 days per provider

## 2018-04-02 NOTE — ED Notes (Signed)
Yellow fall risk bracelet placed on pt.

## 2018-04-02 NOTE — ED Notes (Signed)
Bed: JR93 Expected date:  Expected time:  Means of arrival:  Comments: fall

## 2018-04-03 ENCOUNTER — Inpatient Hospital Stay (HOSPITAL_COMMUNITY)
Admission: EM | Admit: 2018-04-03 | Discharge: 2018-04-07 | DRG: 871 | Disposition: A | Discharge status: Skilled Nursing Facility | Payer: Medicare HMO | Attending: Internal Medicine | Admitting: Internal Medicine

## 2018-04-03 ENCOUNTER — Encounter (HOSPITAL_COMMUNITY): Payer: Self-pay

## 2018-04-03 ENCOUNTER — Emergency Department (HOSPITAL_COMMUNITY): Payer: Medicare HMO

## 2018-04-03 ENCOUNTER — Other Ambulatory Visit: Payer: Self-pay

## 2018-04-03 DIAGNOSIS — F0150 Vascular dementia without behavioral disturbance: Secondary | ICD-10-CM | POA: Diagnosis present

## 2018-04-03 DIAGNOSIS — D649 Anemia, unspecified: Secondary | ICD-10-CM | POA: Diagnosis present

## 2018-04-03 DIAGNOSIS — R0902 Hypoxemia: Secondary | ICD-10-CM | POA: Diagnosis present

## 2018-04-03 DIAGNOSIS — M545 Low back pain, unspecified: Secondary | ICD-10-CM

## 2018-04-03 DIAGNOSIS — R296 Repeated falls: Secondary | ICD-10-CM | POA: Diagnosis present

## 2018-04-03 DIAGNOSIS — Z7902 Long term (current) use of antithrombotics/antiplatelets: Secondary | ICD-10-CM

## 2018-04-03 DIAGNOSIS — J111 Influenza due to unidentified influenza virus with other respiratory manifestations: Secondary | ICD-10-CM

## 2018-04-03 DIAGNOSIS — Z882 Allergy status to sulfonamides status: Secondary | ICD-10-CM

## 2018-04-03 DIAGNOSIS — K219 Gastro-esophageal reflux disease without esophagitis: Secondary | ICD-10-CM | POA: Diagnosis present

## 2018-04-03 DIAGNOSIS — I10 Essential (primary) hypertension: Secondary | ICD-10-CM | POA: Diagnosis present

## 2018-04-03 DIAGNOSIS — Z66 Do not resuscitate: Secondary | ICD-10-CM | POA: Diagnosis present

## 2018-04-03 DIAGNOSIS — Z8673 Personal history of transient ischemic attack (TIA), and cerebral infarction without residual deficits: Secondary | ICD-10-CM

## 2018-04-03 DIAGNOSIS — Z87891 Personal history of nicotine dependence: Secondary | ICD-10-CM

## 2018-04-03 DIAGNOSIS — Z515 Encounter for palliative care: Secondary | ICD-10-CM | POA: Diagnosis present

## 2018-04-03 DIAGNOSIS — R6889 Other general symptoms and signs: Secondary | ICD-10-CM | POA: Diagnosis present

## 2018-04-03 DIAGNOSIS — A419 Sepsis, unspecified organism: Principal | ICD-10-CM | POA: Diagnosis present

## 2018-04-03 DIAGNOSIS — N179 Acute kidney failure, unspecified: Secondary | ICD-10-CM | POA: Diagnosis present

## 2018-04-03 DIAGNOSIS — J439 Emphysema, unspecified: Secondary | ICD-10-CM | POA: Diagnosis present

## 2018-04-03 DIAGNOSIS — S42021D Displaced fracture of shaft of right clavicle, subsequent encounter for fracture with routine healing: Secondary | ICD-10-CM

## 2018-04-03 DIAGNOSIS — K5641 Fecal impaction: Secondary | ICD-10-CM | POA: Diagnosis present

## 2018-04-03 DIAGNOSIS — Z888 Allergy status to other drugs, medicaments and biological substances status: Secondary | ICD-10-CM

## 2018-04-03 DIAGNOSIS — J9601 Acute respiratory failure with hypoxia: Secondary | ICD-10-CM | POA: Diagnosis present

## 2018-04-03 DIAGNOSIS — R69 Illness, unspecified: Secondary | ICD-10-CM

## 2018-04-03 DIAGNOSIS — J209 Acute bronchitis, unspecified: Secondary | ICD-10-CM | POA: Diagnosis present

## 2018-04-03 DIAGNOSIS — Z881 Allergy status to other antibiotic agents status: Secondary | ICD-10-CM

## 2018-04-03 DIAGNOSIS — J449 Chronic obstructive pulmonary disease, unspecified: Secondary | ICD-10-CM

## 2018-04-03 DIAGNOSIS — W19XXXA Unspecified fall, initial encounter: Secondary | ICD-10-CM | POA: Diagnosis present

## 2018-04-03 DIAGNOSIS — M549 Dorsalgia, unspecified: Secondary | ICD-10-CM

## 2018-04-03 DIAGNOSIS — I959 Hypotension, unspecified: Secondary | ICD-10-CM | POA: Diagnosis not present

## 2018-04-03 DIAGNOSIS — Z79899 Other long term (current) drug therapy: Secondary | ICD-10-CM

## 2018-04-03 LAB — CBC
HCT: 39.9 % (ref 36.0–46.0)
Hemoglobin: 12.7 g/dL (ref 12.0–15.0)
MCH: 29.8 pg (ref 26.0–34.0)
MCHC: 31.8 g/dL (ref 30.0–36.0)
MCV: 93.7 fL (ref 80.0–100.0)
Platelets: 392 10*3/uL (ref 150–400)
RBC: 4.26 MIL/uL (ref 3.87–5.11)
RDW: 14 % (ref 11.5–15.5)
WBC: 19.8 10*3/uL — ABNORMAL HIGH (ref 4.0–10.5)
nRBC: 0 % (ref 0.0–0.2)

## 2018-04-03 LAB — URINALYSIS, ROUTINE W REFLEX MICROSCOPIC
Bilirubin Urine: NEGATIVE
GLUCOSE, UA: NEGATIVE mg/dL
HGB URINE DIPSTICK: NEGATIVE
Ketones, ur: NEGATIVE mg/dL
LEUKOCYTES UA: NEGATIVE
Nitrite: NEGATIVE
Protein, ur: NEGATIVE mg/dL
Specific Gravity, Urine: 1.013 (ref 1.005–1.030)
pH: 5 (ref 5.0–8.0)

## 2018-04-03 LAB — COMPREHENSIVE METABOLIC PANEL
ALT: 21 U/L (ref 0–44)
AST: 46 U/L — ABNORMAL HIGH (ref 15–41)
Albumin: 3.9 g/dL (ref 3.5–5.0)
Alkaline Phosphatase: 77 U/L (ref 38–126)
Anion gap: 11 (ref 5–15)
BUN: 39 mg/dL — AB (ref 8–23)
CO2: 21 mmol/L — ABNORMAL LOW (ref 22–32)
Calcium: 9.3 mg/dL (ref 8.9–10.3)
Chloride: 101 mmol/L (ref 98–111)
Creatinine, Ser: 1.97 mg/dL — ABNORMAL HIGH (ref 0.44–1.00)
GFR calc Af Amer: 28 mL/min — ABNORMAL LOW (ref >60)
GFR calc non Af Amer: 24 mL/min — ABNORMAL LOW (ref >60)
Glucose, Bld: 111 mg/dL — ABNORMAL HIGH (ref 70–99)
Potassium: 5.2 mmol/L — ABNORMAL HIGH (ref 3.5–5.1)
Sodium: 133 mmol/L — ABNORMAL LOW (ref 135–145)
Total Bilirubin: 0.8 mg/dL (ref 0.3–1.2)
Total Protein: 7.1 g/dL (ref 6.5–8.1)

## 2018-04-03 LAB — BLOOD GAS, ARTERIAL
Acid-base deficit: 2.9 mmol/L — ABNORMAL HIGH (ref 0.0–2.0)
Bicarbonate: 21.1 mmol/L (ref 20.0–28.0)
Drawn by: 225631
O2 Content: 2 L/min
O2 Saturation: 94.9 %
PH ART: 7.386 (ref 7.350–7.450)
Patient temperature: 98.3
RATE: 16 resp/min
pCO2 arterial: 35.8 mmHg (ref 32.0–48.0)
pO2, Arterial: 76.9 mmHg — ABNORMAL LOW (ref 83.0–108.0)

## 2018-04-03 LAB — TROPONIN I

## 2018-04-03 LAB — INFLUENZA PANEL BY PCR (TYPE A & B)
Influenza A By PCR: NEGATIVE
Influenza B By PCR: NEGATIVE

## 2018-04-03 LAB — I-STAT CG4 LACTIC ACID, ED: Lactic Acid, Venous: 0.94 mmol/L (ref 0.5–1.9)

## 2018-04-03 LAB — LIPASE, BLOOD: Lipase: 28 U/L (ref 11–51)

## 2018-04-03 MED ORDER — SODIUM CHLORIDE 0.9 % IV SOLN
INTRAVENOUS | Status: DC
  Administered 2018-04-03 – 2018-04-05 (×4): via INTRAVENOUS

## 2018-04-03 MED ORDER — IPRATROPIUM-ALBUTEROL 0.5-2.5 (3) MG/3ML IN SOLN
3.0000 mL | Freq: Once | RESPIRATORY_TRACT | Status: AC
  Administered 2018-04-04: 3 mL via RESPIRATORY_TRACT
  Filled 2018-04-03: qty 3

## 2018-04-03 MED ORDER — SODIUM CHLORIDE 0.9 % IV BOLUS
1000.0000 mL | Freq: Once | INTRAVENOUS | Status: AC
  Administered 2018-04-03: 1000 mL via INTRAVENOUS

## 2018-04-03 NOTE — ED Notes (Signed)
Pt placed on Purewick. Will collect urine sample when pt voids.

## 2018-04-03 NOTE — ED Notes (Signed)
Bed: SU86 Expected date:  Expected time:  Means of arrival:  Comments: EMS [facility] low SPo2

## 2018-04-03 NOTE — ED Provider Notes (Signed)
[facility] DEPT Provider Note   CSN: [id number] Arrival date & time: 04/03/18  1809     History   Chief Complaint Chief Complaint  Patient presents with  . Shortness of Breath    HPI [name] is a 73 y.o. female.  HPI Pt presents for possible shortness of breath.  Pt is a resident of a nursing home.  She was seen yesterday in the ED after a recent fall.  Pt had a work up including head CTs and wrist xrays.  Pt was sent in from her nursing facility today because of possible shortness of breath.   Per the EMS report the nursing home stated her color was bad, she appeared short of breath and her oxygen sats were low.  When EMS arrived they found the patient had normal breathing pattern.  She did not appear short of breath.  Patient has dementia and is unable to provide any clear history.  She does states she had some chest pain however.  She also complains of abdominal pain. Past Medical History:  Diagnosis Date  . Carotid artery aneurysm ([place]) 03/04/2012  . COPD (chronic obstructive pulmonary disease) ([place])   . Depression   . DJD (degenerative joint disease)   . GERD (gastroesophageal reflux disease)   . Hypertension   . Stroke [facility])    was on Plavix - not now  . Vascular dementia [facility])     Patient Active Problem List   Diagnosis Date Noted  . Vascular dementia without behavioral disturbance ([place]) 06/30/2016  . Disease ruled out after examination   . Aphasia 05/15/2016  . Atherosclerosis of left carotid artery   . Cerebral embolism with cerebral infarction 02/27/2016  . Acute encephalopathy 02/26/2016  . Osteoporosis 07/04/2015  . Impaired cognition 03/30/2014  . Vitamin D deficiency 08/18/2013  . Hyperlipidemia 08/18/2013  . Prediabetes 08/18/2013  . Medication management 08/18/2013  . CKD (chronic kidney disease) stage 3, GFR 30-59 ml/min ([name]) 07/01/2013  . Anemia of chronic disease 07/01/2013  . Depression, major, in remission  ([place]) 06/30/2013  . Essential hypertension 02/09/2013  . COPD GOLD III 03/28/2012  . Vocal cord dysfunction 03/28/2012  . Carotid artery aneurysm ([place]) 03/04/2012  . Smoker 03/04/2012    Past Surgical History:  Procedure Laterality Date  . CATARACT EXTRACTION Bilateral   . COLONOSCOPY  2004  . HERNIA REPAIR Right 2014   inguinal  . KYPHOPLASTY N/A 10/28/2014   Procedure: KYPHOPLASTY;  Surgeon: Phylliss Bob, MD;  Location: [facility];  Service: Orthopedics;  Laterality: N/A;  Lumbar 1 kyphoplasty  . MICROLARYNGOSCOPY WITH CO2 LASER AND EXCISION OF VOCAL CORD LESION N/A 06/05/2013   Procedure: MICROLARYNGOSCOPY WITH CO2 LASER AND EXCISION OF VOCAL CORD POLYPS;  Surgeon: Melida Quitter, MD;  Location: [place];  Service: ENT;  Laterality: N/A;  . TONSILLECTOMY    . TUBAL LIGATION  30 years  . VIDEO BRONCHOSCOPY  04/24/2012   Procedure: VIDEO BRONCHOSCOPY WITHOUT FLUORO;  Surgeon: Rigoberto Noel, MD;  Location: [facility];  Service: Cardiopulmonary;  Laterality: Bilateral;     OB History   No obstetric history on file.      Home Medications    Prior to Admission medications   Medication Sig Start Date End Date Taking? Authorizing Provider  alum & mag hydroxide-simeth ([place]) 200-200-20 MG/5ML suspension Take 30 mLs by mouth every 6 (six) hours as needed for indigestion or heartburn.   Yes [provider]  amLODipine (NORVASC) 5 MG tablet Take  5 mg by mouth daily. 06/12/16  Yes [provider]  atorvastatin (LIPITOR) 20 MG tablet Take 1 tablet (20 mg total) by mouth daily at 6 PM. 02/29/16  Yes Reyne Dumas, MD  b complex vitamins tablet Take 1 tablet by mouth daily. 01/25/16  Yes Rolene Course, PA-C  clopidogrel (PLAVIX) 75 MG tablet Take 75 mg by mouth daily.   Yes [provider]  gabapentin (NEURONTIN) 300 MG capsule Take 300 mg by mouth at bedtime. 06/12/16  Yes [provider]  loperamide (IMODIUM) 2 MG capsule Take 2 mg by mouth daily as needed for  diarrhea or loose stools.   Yes [provider]  losartan (COZAAR) 100 MG tablet Take 100 mg by mouth daily. 06/13/16  Yes [provider]  magnesium hydroxide (MILK OF MAGNESIA) 400 MG/5ML suspension Take 30 mLs by mouth at bedtime as needed for mild constipation.   Yes [provider]  mirtazapine (REMERON) 30 MG tablet Take 30 mg by mouth at bedtime. 06/28/16  Yes [provider]  OLANZapine (ZYPREXA) 15 MG tablet Take 7.5 mg by mouth 2 (two) times daily.    Yes [provider]  oxybutynin (DITROPAN) 5 MG tablet TAKE 1/2 TO 1 TABLET BY MOUTH THREE TIMES DAILY FOR BLADDER CONTROL Patient taking differently: Take 5 mg by mouth 3 (three) times daily.  04/23/16  Yes Unk Pinto, MD  traMADol (ULTRAM) 50 MG tablet Take 1 tablet (50 mg total) by mouth 2 (two) times daily as needed. Patient taking differently: Take 50 mg by mouth 3 (three) times daily.  02/29/16  Yes Reyne Dumas, MD  traZODone (DESYREL) 50 MG tablet Take 50 mg by mouth at bedtime as needed for sleep.   Yes [provider]  vitamin C (ASCORBIC ACID) 500 MG tablet Take 500 mg by mouth daily.   Yes [provider]  acetaminophen (TYLENOL) 500 MG tablet Take 500 mg by mouth every 4 (four) hours as needed for mild pain.    [provider]  guaiFENesin (ROBITUSSIN) 100 MG/5ML SOLN Take 10 mLs by mouth every 6 (six) hours as needed for cough or to loosen phlegm.    [provider]  trolamine salicylate (ASPERCREME) 10 % cream Apply 1 application topically 2 (two) times daily as needed for muscle pain.     [provider]    Family History Family History  Problem Relation Age of Onset  . Breast cancer Mother   . Breast cancer Sister     Social History Social History   Tobacco Use  . Smoking status: Former Smoker    Packs/day: 0.25    Years: 30.00    Pack years: 7.50    Types: Cigarettes  . Smokeless tobacco: Never Used  Substance Use  Topics  . Alcohol use: No  . Drug use: No     Allergies   Amoxapine and related; Amoxicillin; Ciprocinonide [fluocinolone]; Ciprofloxacin; Doxycycline; Erythromycin; Sulfa antibiotics; and Zofran [ondansetron hcl]   Review of Systems Review of Systems  All other systems reviewed and are negative.    Physical Exam Updated Vital Signs BP 111/68   Pulse 86   Temp 98.9 F (37.2 C) (Oral)   Resp 16   SpO2 99%   Physical Exam Vitals signs and nursing note reviewed.  Constitutional:      Comments: Elderly, frail  HENT:     Head: Normocephalic and atraumatic.     Right Ear: External ear normal.     Left Ear:  External ear normal.  Eyes:     General: No scleral icterus.       Right eye: No discharge.        Left eye: No discharge.     Conjunctiva/sclera: Conjunctivae normal.  Neck:     Musculoskeletal: Neck supple.     Trachea: No tracheal deviation.  Cardiovascular:     Rate and Rhythm: Normal rate and regular rhythm.  Pulmonary:     Effort: Pulmonary effort is normal. No respiratory distress.     Breath sounds: Normal breath sounds. No stridor. No wheezing or rales.  Abdominal:     General: Bowel sounds are normal. There is no distension.     Palpations: Abdomen is soft.     Tenderness: There is abdominal tenderness. There is no guarding or rebound.     Comments: Mild tenderness palpation the epigastric region  Musculoskeletal:        General: No tenderness.     Comments: Right shoulder in a sling, left wrist in a Velcro brace  Skin:    General: Skin is warm and dry.     Findings: No rash.  Neurological:     Cranial Nerves: No cranial nerve deficit (no facial droop, extraocular movements intact, no slurred speech).     Sensory: No sensory deficit.     Motor: No abnormal muscle tone or seizure activity.     Coordination: Coordination normal.      ED Treatments / Results  Labs (all labs ordered are listed, but only abnormal results are displayed) Labs  Reviewed  CBC - Abnormal; Notable for the following components:      Result Value   WBC 19.8 (*)    All other components within normal limits  COMPREHENSIVE METABOLIC PANEL - Abnormal; Notable for the following components:   Sodium 133 (*)    Potassium 5.2 (*)    CO2 21 (*)    Glucose, Bld 111 (*)    BUN 39 (*)    Creatinine, Ser 1.97 (*)    AST 46 (*)    GFR calc non Af Amer 24 (*)    GFR calc Af Amer 28 (*)    All other components within normal limits  BLOOD GAS, ARTERIAL - Abnormal; Notable for the following components:   pO2, Arterial 76.9 (*)    Acid-base deficit 2.9 (*)    All other components within normal limits  CULTURE, BLOOD (ROUTINE X 2)  CULTURE, BLOOD (ROUTINE X 2)  TROPONIN I  LIPASE, BLOOD  URINALYSIS, ROUTINE W REFLEX MICROSCOPIC  INFLUENZA PANEL BY PCR (TYPE A & B)  I-STAT CG4 LACTIC ACID, ED    EKG None  Radiology Dg Wrist Complete Left  Result Date: 04/02/2018 CLINICAL DATA:  Unwitnessed fall this morning with left wrist deformity. EXAM: LEFT WRIST - COMPLETE 3+ VIEW COMPARISON:  Radiographs 04/24/2016 FINDINGS: Remote distal radius fracture has healed. Mild residual posttraumatic deformity. Disruption of the distal radioulnar joint with ulna positive variance appears chronic and similar to prior. No acute fracture. The bones are under mineralized. IMPRESSION: 1. No acute fracture of the left wrist. 2. Remote distal radius fracture with healed posttraumatic deformity. Ulna positive variance was seen on prior exam. Electronically Signed   By: Keith Rake M.D.   On: 04/02/2018 01:45   Ct Head Wo Contrast  Result Date: 04/02/2018 CLINICAL DATA:  Post unwitnessed fall with laceration to posterior head. Head trauma, intracranial venous injury suspected EXAM: CT HEAD WITHOUT CONTRAST TECHNIQUE: Contiguous axial  images were obtained from the base of the skull through the vertex without intravenous contrast. COMPARISON:  Head CT 06/29/2016 FINDINGS: Brain: No  intracranial hemorrhage, mass effect, or midline shift. Stable degree of atrophy and chronic small vessel ischemia. Remote left temporoparietal infarcts with associated ex vacuo dilatation of the left lateral ventricle. Remote lacunar infarcts in bilateral basal ganglia. No evidence of territorial infarct or acute ischemia. No extra-axial or intracranial fluid collection. Vascular: Atherosclerosis of skullbase vasculature without hyperdense vessel or abnormal calcification. Skull: No fracture or focal lesion. Sinuses/Orbits: Paranasal sinuses and mastoid air cells are clear. The visualized orbits are unremarkable. Bilateral cataract resection. Other: Small posterior left parietal scalp hematoma. IMPRESSION: 1. Small posterior left parietal scalp hematoma. No acute intracranial abnormality. No skull fracture. 2. Stable atrophy and chronic small vessel ischemia. Unchanged remote infarcts in left MCA distribution and bilateral basal ganglia. Electronically Signed   By: Keith Rake M.D.   On: 04/02/2018 01:52   Dg Chest Portable 1 View  Result Date: 04/03/2018 CLINICAL DATA:  Altered mental status EXAM: PORTABLE CHEST 1 VIEW COMPARISON:  03/30/2018, 06/30/2016 FINDINGS: Streaky bibasilar atelectasis. No focal consolidation or effusion. Normal cardiomediastinal silhouette with aortic atherosclerosis. No pneumothorax. Old distal right clavicle fracture. IMPRESSION: Low lung volumes with hazy bibasilar atelectasis. Electronically Signed   By: Donavan Foil M.D.   On: 04/03/2018 19:30    Procedures .Critical Care Performed by: Dorie Rank, MD Authorized by: Dorie Rank, MD   Critical care provider statement:    Critical care time (minutes):  35   Critical care was time spent personally by me on the following activities:  Discussions with consultants, evaluation of patient's response to treatment, examination of patient, ordering and performing treatments and interventions, ordering and review of laboratory  studies, ordering and review of radiographic studies, pulse oximetry, re-evaluation of patient's condition, obtaining history from patient or surrogate and review of old charts   (including critical care time)  Medications Ordered in ED Medications  0.9 %  sodium chloride infusion ( Intravenous Restarted 04/03/18 2312)  ipratropium-albuterol (DUONEB) 0.5-2.5 (3) MG/3ML nebulizer solution 3 mL (has no administration in time range)  sodium chloride 0.9 % bolus 1,000 mL (0 mLs Intravenous Stopped 04/03/18 2306)     Initial Impression / Assessment and Plan / ED Course  I have reviewed the triage vital signs and the nursing notes.  Pertinent labs & imaging results that were available during my care of the patient were reviewed by me and considered in my medical decision making (see chart for details).  Clinical Course as of Apr 04 2351  [name] Apr 03, 2018  2205 Labs reviewed.  Lactic acid normal.  WBC is elevated.  BUN and CR elevated.    [HW]  2993 Attempted to take pt off her oxygen and she drops into the mid 80s.  Will start back on oxygen   [JK]    Clinical Course User Index [JK] Dorie Rank, MD    Patient presented from her nursing home for evaluation of hypoxia.  Patient is unable to provide any clear history but sounds like she has had some respiratory symptoms recently.  Patient has history of COPD but is not chronically on oxygen.  The work-up was notable for transient hypotension but she did respond to IV antibiotics.  Her electrolytes do show evidence of acute kidney injury and a leukocytosis.  Chest x-ray does not show a definite pneumonia.  Her ABG does show respiratory acidosis but there was a component  of hypoxia.  Patient has been started empiric antibiotics to cover for possible pneumonia, sepsis initially with her hypotension earlier..  Influenza test is pending.  I attempted to take her off her oxygen but her oxygen saturation dropped.  We will give her a breathing treatment and  will consult the medical service for admission and further treatment.   Final Clinical Impressions(s) / ED Diagnoses   Final diagnoses:  Influenza-like illness  Hypoxia  Chronic obstructive pulmonary disease, unspecified COPD type ([place])      Dorie Rank, MD 04/03/18 2354

## 2018-04-03 NOTE — ED Notes (Addendum)
[name] (daughter): [phone]

## 2018-04-03 NOTE — ED Notes (Signed)
x2 unsuccessful attempts to get IV 

## 2018-04-03 NOTE — ED Triage Notes (Signed)
Pt BIB EMS from [facility]. Facility called out because patient was "SHOB, turning grey, and low O2 sats". When EMS arrived patient was not [facility] or turning grey. Hx of COPD and dementia. Patient is showing no signs of distress at this time. Pt has old right clavicle injury from a fall in the past.  93% RA to 98% on 2L 106/48 HR 90 RR 18

## 2018-04-04 ENCOUNTER — Other Ambulatory Visit: Payer: Self-pay

## 2018-04-04 ENCOUNTER — Inpatient Hospital Stay (HOSPITAL_COMMUNITY): Payer: Medicare HMO

## 2018-04-04 ENCOUNTER — Observation Stay (HOSPITAL_COMMUNITY): Payer: Medicare HMO

## 2018-04-04 ENCOUNTER — Encounter (HOSPITAL_COMMUNITY): Payer: Self-pay | Admitting: Radiology

## 2018-04-04 DIAGNOSIS — W19XXXA Unspecified fall, initial encounter: Secondary | ICD-10-CM | POA: Diagnosis present

## 2018-04-04 DIAGNOSIS — Z881 Allergy status to other antibiotic agents status: Secondary | ICD-10-CM | POA: Diagnosis not present

## 2018-04-04 DIAGNOSIS — R0902 Hypoxemia: Secondary | ICD-10-CM | POA: Diagnosis present

## 2018-04-04 DIAGNOSIS — R296 Repeated falls: Secondary | ICD-10-CM | POA: Diagnosis present

## 2018-04-04 DIAGNOSIS — K5641 Fecal impaction: Secondary | ICD-10-CM | POA: Diagnosis present

## 2018-04-04 DIAGNOSIS — F0150 Vascular dementia without behavioral disturbance: Secondary | ICD-10-CM

## 2018-04-04 DIAGNOSIS — Z7902 Long term (current) use of antithrombotics/antiplatelets: Secondary | ICD-10-CM | POA: Diagnosis not present

## 2018-04-04 DIAGNOSIS — Z87891 Personal history of nicotine dependence: Secondary | ICD-10-CM | POA: Diagnosis not present

## 2018-04-04 DIAGNOSIS — R6889 Other general symptoms and signs: Secondary | ICD-10-CM | POA: Diagnosis present

## 2018-04-04 DIAGNOSIS — I10 Essential (primary) hypertension: Secondary | ICD-10-CM

## 2018-04-04 DIAGNOSIS — A419 Sepsis, unspecified organism: Secondary | ICD-10-CM | POA: Diagnosis present

## 2018-04-04 DIAGNOSIS — J449 Chronic obstructive pulmonary disease, unspecified: Secondary | ICD-10-CM

## 2018-04-04 DIAGNOSIS — Z8673 Personal history of transient ischemic attack (TIA), and cerebral infarction without residual deficits: Secondary | ICD-10-CM | POA: Diagnosis not present

## 2018-04-04 DIAGNOSIS — Z66 Do not resuscitate: Secondary | ICD-10-CM | POA: Diagnosis present

## 2018-04-04 DIAGNOSIS — D649 Anemia, unspecified: Secondary | ICD-10-CM | POA: Diagnosis present

## 2018-04-04 DIAGNOSIS — J439 Emphysema, unspecified: Secondary | ICD-10-CM | POA: Diagnosis present

## 2018-04-04 DIAGNOSIS — N179 Acute kidney failure, unspecified: Secondary | ICD-10-CM | POA: Diagnosis present

## 2018-04-04 DIAGNOSIS — J9601 Acute respiratory failure with hypoxia: Secondary | ICD-10-CM | POA: Diagnosis present

## 2018-04-04 DIAGNOSIS — Z882 Allergy status to sulfonamides status: Secondary | ICD-10-CM | POA: Diagnosis not present

## 2018-04-04 DIAGNOSIS — J209 Acute bronchitis, unspecified: Secondary | ICD-10-CM | POA: Diagnosis present

## 2018-04-04 DIAGNOSIS — Z79899 Other long term (current) drug therapy: Secondary | ICD-10-CM | POA: Diagnosis not present

## 2018-04-04 DIAGNOSIS — K219 Gastro-esophageal reflux disease without esophagitis: Secondary | ICD-10-CM | POA: Diagnosis present

## 2018-04-04 DIAGNOSIS — I959 Hypotension, unspecified: Secondary | ICD-10-CM | POA: Diagnosis not present

## 2018-04-04 DIAGNOSIS — Z515 Encounter for palliative care: Secondary | ICD-10-CM | POA: Diagnosis present

## 2018-04-04 DIAGNOSIS — K5900 Constipation, unspecified: Secondary | ICD-10-CM | POA: Diagnosis not present

## 2018-04-04 DIAGNOSIS — S42021D Displaced fracture of shaft of right clavicle, subsequent encounter for fracture with routine healing: Secondary | ICD-10-CM | POA: Diagnosis not present

## 2018-04-04 DIAGNOSIS — Z888 Allergy status to other drugs, medicaments and biological substances status: Secondary | ICD-10-CM | POA: Diagnosis not present

## 2018-04-04 LAB — RESPIRATORY PANEL BY PCR
Adenovirus: NOT DETECTED
Bordetella pertussis: NOT DETECTED
CHLAMYDOPHILA PNEUMONIAE-RVPPCR: NOT DETECTED
Coronavirus 229E: NOT DETECTED
Coronavirus HKU1: NOT DETECTED
Coronavirus NL63: NOT DETECTED
Coronavirus OC43: NOT DETECTED
Influenza A: NOT DETECTED
Influenza B: NOT DETECTED
Metapneumovirus: NOT DETECTED
Mycoplasma pneumoniae: NOT DETECTED
PARAINFLUENZA VIRUS 2-RVPPCR: NOT DETECTED
PARAINFLUENZA VIRUS 3-RVPPCR: NOT DETECTED
Parainfluenza Virus 1: NOT DETECTED
Parainfluenza Virus 4: NOT DETECTED
Respiratory Syncytial Virus: NOT DETECTED
Rhinovirus / Enterovirus: NOT DETECTED

## 2018-04-04 LAB — BASIC METABOLIC PANEL
Anion gap: 11 (ref 5–15)
BUN: 30 mg/dL — ABNORMAL HIGH (ref 8–23)
CHLORIDE: 110 mmol/L (ref 98–111)
CO2: 16 mmol/L — ABNORMAL LOW (ref 22–32)
Calcium: 8 mg/dL — ABNORMAL LOW (ref 8.9–10.3)
Creatinine, Ser: 1.24 mg/dL — ABNORMAL HIGH (ref 0.44–1.00)
GFR calc Af Amer: 50 mL/min — ABNORMAL LOW (ref >60)
GFR calc non Af Amer: 43 mL/min — ABNORMAL LOW (ref >60)
Glucose, Bld: 100 mg/dL — ABNORMAL HIGH (ref 70–99)
Potassium: 4.6 mmol/L (ref 3.5–5.1)
Sodium: 137 mmol/L (ref 135–145)

## 2018-04-04 LAB — CBC
HEMATOCRIT: 35.8 % — AB (ref 36.0–46.0)
Hemoglobin: 11.3 g/dL — ABNORMAL LOW (ref 12.0–15.0)
MCH: 29.7 pg (ref 26.0–34.0)
MCHC: 31.6 g/dL (ref 30.0–36.0)
MCV: 94 fL (ref 80.0–100.0)
Platelets: 338 10*3/uL (ref 150–400)
RBC: 3.81 MIL/uL — ABNORMAL LOW (ref 3.87–5.11)
RDW: 14 % (ref 11.5–15.5)
WBC: 19.5 10*3/uL — AB (ref 4.0–10.5)
nRBC: 0 % (ref 0.0–0.2)

## 2018-04-04 LAB — HIV ANTIBODY (ROUTINE TESTING W REFLEX): HIV Screen 4th Generation wRfx: NONREACTIVE

## 2018-04-04 MED ORDER — OLANZAPINE 5 MG PO TABS
7.5000 mg | ORAL_TABLET | Freq: Two times a day (BID) | ORAL | Status: DC
  Administered 2018-04-04 – 2018-04-07 (×7): 7.5 mg via ORAL
  Filled 2018-04-04: qty 1
  Filled 2018-04-04 (×6): qty 2

## 2018-04-04 MED ORDER — ALBUTEROL SULFATE (2.5 MG/3ML) 0.083% IN NEBU: 2.5000 mg | INHALATION_SOLUTION | RESPIRATORY_TRACT | Status: DC | PRN

## 2018-04-04 MED ORDER — ALUM & MAG HYDROXIDE-SIMETH 200-200-20 MG/5ML PO SUSP: 30.0000 mL | Freq: Four times a day (QID) | ORAL | Status: DC | PRN

## 2018-04-04 MED ORDER — CLOPIDOGREL BISULFATE 75 MG PO TABS
75.0000 mg | ORAL_TABLET | Freq: Every day | ORAL | Status: DC
  Administered 2018-04-04 – 2018-04-07 (×4): 75 mg via ORAL
  Filled 2018-04-04 (×4): qty 1

## 2018-04-04 MED ORDER — ATORVASTATIN CALCIUM 20 MG PO TABS
20.0000 mg | ORAL_TABLET | Freq: Every day | ORAL | Status: DC
  Administered 2018-04-04 – 2018-04-07 (×4): 20 mg via ORAL
  Filled 2018-04-04 (×5): qty 1

## 2018-04-04 MED ORDER — ONDANSETRON HCL 4 MG/2ML IJ SOLN: 4.0000 mg | Freq: Four times a day (QID) | INTRAMUSCULAR | Status: DC | PRN

## 2018-04-04 MED ORDER — TECHNETIUM TO 99M ALBUMIN AGGREGATED
4.4000 | Freq: Once | INTRAVENOUS | Status: AC
  Administered 2018-04-04: 4.4 via INTRAVENOUS

## 2018-04-04 MED ORDER — TRAMADOL HCL 50 MG PO TABS: 50.0000 mg | ORAL_TABLET | Freq: Two times a day (BID) | ORAL | 0 refills | Status: DC | PRN

## 2018-04-04 MED ORDER — MOMETASONE FURO-FORMOTEROL FUM 200-5 MCG/ACT IN AERO
1.0000 | INHALATION_SPRAY | Freq: Two times a day (BID) | RESPIRATORY_TRACT | Status: DC
  Administered 2018-04-04 – 2018-04-07 (×6): 1 via RESPIRATORY_TRACT
  Filled 2018-04-04 (×2): qty 8.8

## 2018-04-04 MED ORDER — ACETAMINOPHEN 500 MG PO TABS: 500.0000 mg | ORAL_TABLET | ORAL | Status: DC | PRN

## 2018-04-04 MED ORDER — LEVOFLOXACIN IN D5W 500 MG/100ML IV SOLN
500.0000 mg | INTRAVENOUS | Status: DC
  Administered 2018-04-04: 500 mg via INTRAVENOUS
  Filled 2018-04-04: qty 100

## 2018-04-04 MED ORDER — OLANZAPINE 5 MG PO TABS: 7.5000 mg | ORAL_TABLET | Freq: Two times a day (BID) | ORAL | Status: DC

## 2018-04-04 MED ORDER — ACETAMINOPHEN 325 MG PO TABS
650.0000 mg | ORAL_TABLET | Freq: Four times a day (QID) | ORAL | Status: DC | PRN
  Administered 2018-04-04 – 2018-04-05 (×2): 650 mg via ORAL
  Filled 2018-04-04 (×3): qty 2

## 2018-04-04 MED ORDER — ONDANSETRON HCL 4 MG PO TABS: 4.0000 mg | ORAL_TABLET | Freq: Four times a day (QID) | ORAL | Status: DC | PRN

## 2018-04-04 MED ORDER — UMECLIDINIUM BROMIDE 62.5 MCG/INH IN AEPB
1.0000 | INHALATION_SPRAY | Freq: Every day | RESPIRATORY_TRACT | Status: DC
  Administered 2018-04-04: 1 via RESPIRATORY_TRACT
  Filled 2018-04-04: qty 7

## 2018-04-04 MED ORDER — GUAIFENESIN 100 MG/5ML PO SOLN: 10.0000 mL | Freq: Four times a day (QID) | ORAL | Status: DC | PRN

## 2018-04-04 MED ORDER — SODIUM CHLORIDE 0.9 % IV BOLUS
500.0000 mL | Freq: Once | INTRAVENOUS | Status: AC
  Administered 2018-04-04: 500 mL via INTRAVENOUS

## 2018-04-04 MED ORDER — TECHNETIUM TC 99M DIETHYLENETRIAME-PENTAACETIC ACID
31.5000 | Freq: Once | INTRAVENOUS | Status: AC
  Administered 2018-04-04: 31.5 via RESPIRATORY_TRACT

## 2018-04-04 MED ORDER — LEVOFLOXACIN IN D5W 250 MG/50ML IV SOLN
250.0000 mg | INTRAVENOUS | Status: DC
  Administered 2018-04-05 – 2018-04-06 (×2): 250 mg via INTRAVENOUS
  Filled 2018-04-04 (×2): qty 50

## 2018-04-04 MED ORDER — LOPERAMIDE HCL 2 MG PO CAPS: 2.0000 mg | ORAL_CAPSULE | Freq: Every day | ORAL | Status: DC | PRN

## 2018-04-04 MED ORDER — TRAZODONE HCL 50 MG PO TABS
50.0000 mg | ORAL_TABLET | Freq: Every evening | ORAL | Status: DC | PRN
  Administered 2018-04-05: 50 mg via ORAL
  Filled 2018-04-04: qty 1

## 2018-04-04 MED ORDER — ACETAMINOPHEN 650 MG RE SUPP: 650.0000 mg | Freq: Four times a day (QID) | RECTAL | Status: DC | PRN

## 2018-04-04 MED ORDER — MAGNESIUM HYDROXIDE 400 MG/5ML PO SUSP: 30.0000 mL | Freq: Every evening | ORAL | Status: DC | PRN

## 2018-04-04 MED ORDER — OXYBUTYNIN CHLORIDE 5 MG PO TABS
5.0000 mg | ORAL_TABLET | Freq: Three times a day (TID) | ORAL | Status: DC
  Administered 2018-04-04 – 2018-04-07 (×11): 5 mg via ORAL
  Filled 2018-04-04 (×11): qty 1

## 2018-04-04 MED ORDER — ENSURE ENLIVE PO LIQD
237.0000 mL | Freq: Two times a day (BID) | ORAL | Status: DC
  Administered 2018-04-05 – 2018-04-07 (×5): 237 mL via ORAL

## 2018-04-04 MED ORDER — GABAPENTIN 300 MG PO CAPS
300.0000 mg | ORAL_CAPSULE | Freq: Every day | ORAL | Status: DC
  Administered 2018-04-04 – 2018-04-06 (×3): 300 mg via ORAL
  Filled 2018-04-04 (×3): qty 1

## 2018-04-04 MED ORDER — MIRTAZAPINE 15 MG PO TABS
30.0000 mg | ORAL_TABLET | Freq: Every day | ORAL | Status: DC
  Administered 2018-04-04 – 2018-04-06 (×3): 30 mg via ORAL
  Filled 2018-04-04 (×3): qty 2

## 2018-04-04 MED ORDER — ENOXAPARIN SODIUM 40 MG/0.4ML ~~LOC~~ SOLN
40.0000 mg | SUBCUTANEOUS | Status: DC
  Administered 2018-04-04 – 2018-04-07 (×4): 40 mg via SUBCUTANEOUS
  Filled 2018-04-04 (×4): qty 0.4

## 2018-04-04 MED ORDER — TRAMADOL HCL 50 MG PO TABS
50.0000 mg | ORAL_TABLET | Freq: Three times a day (TID) | ORAL | Status: DC
  Administered 2018-04-04 – 2018-04-07 (×11): 50 mg via ORAL
  Filled 2018-04-04 (×11): qty 1

## 2018-04-04 NOTE — Progress Notes (Signed)
PHARMACY NOTE:  ANTIMICROBIAL RENAL DOSAGE ADJUSTMENT  Current antimicrobial regimen includes a mismatch between antimicrobial dosage and estimated renal function.  As per policy approved by the Pharmacy & Therapeutics and Medical Executive Committees, the antimicrobial dosage will be adjusted accordingly.  Current antimicrobial dosage:  Levaquin  Indication: possible PNA  Renal Function:  CrCl cannot be calculated (Unknown ideal weight.).  CrCl ~ 31 ml/min []      On intermittent HD, scheduled: []      On CRRT    Antimicrobial dosage has been changed to:  Levaquin 250 mg iv q 24h  Additional comments:   Thank you for allowing pharmacy to be a part of this patient's care.  Napoleon Form, [facility] 04/04/2018 2:47 PM

## 2018-04-04 NOTE — ED Notes (Signed)
DAUGHTER WILL BE ARRIVING THIS EVENING. UPDATED ON PT'S CURRENT STATUS.  AWARE OF HOSPICE ASSESSMENT.  BED ASSIGNMENT  PAM RN AWARE OF PT'S VITALS, ? SEPSIS, PO INTAKE, SPLINTS, CONFUSION, HOSPICE, DAUGHTER'S ARRIVAL, ENCOURAGED TO CALL FOR ANY QUESTIONS, ANTIBIOTIC INFUSION

## 2018-04-04 NOTE — ED Notes (Signed)
PT DECLINES MEAL

## 2018-04-04 NOTE — ED Notes (Signed)
ED TO INPATIENT HANDOFF REPORT  Name/Age/Gender [name] 73 y.o. female  Code Status    Code Status Orders  (From admission, onward)         Start     Ordered   04/04/18 0042  Full code  Continuous     04/04/18 0043        Code Status History    Date Active Date Inactive Code Status Order ID Comments User Context   06/29/2016 1642 07/01/2016 1641 Full Code [id number]  Lajean Saver, MD ED   02/26/2016 2048 02/29/2016 2200 Full Code [id number]  Etta Quill, DO ED   07/02/2013 0031 07/03/2013 1853 Full Code [id number]  Robbie Lis, MD Inpatient   06/05/2013 1653 06/06/2013 1501 Full Code [id number]  Melida Quitter, MD Inpatient      Home/SNF/Other Skilled nursing facility  Chief Complaint low O2  Level of Care/Admitting Diagnosis ED Disposition    ED Disposition Condition Comment   Admit  Hospital Area: [facility] [[id number]]  Level of Care: Telemetry [5]  Admit to tele based on following criteria: Complex arrhythmia (Bradycardia/Tachycardia)  Diagnosis: AKI (acute kidney injury) [facility]) [[id number]]  Admitting Physician: Etta Quill [phone]  Attending Physician: Etta Quill [[id number]]  PT Class (Do Not Modify): Observation [104]  PT Acc Code (Do Not Modify): Observation [10022]       Medical History Past Medical History:  Diagnosis Date  . Carotid artery aneurysm ([place]) 03/04/2012  . COPD (chronic obstructive pulmonary disease) ([place])   . Depression   . DJD (degenerative joint disease)   . GERD (gastroesophageal reflux disease)   . Hypertension   . Stroke [facility])    was on Plavix - not now  . Vascular dementia ([name])     Allergies Allergies  Allergen Reactions  . Amoxapine And Related Nausea And Vomiting  . Amoxicillin     itching  . Ciprocinonide [Fluocinolone]     Pt unsure  . Ciprofloxacin     Pt unsure  . Doxycycline Nausea And Vomiting  . Erythromycin Hives  . Sulfa Antibiotics Other (See Comments)    Unknown-reaction as a  child  . Zofran [Ondansetron Hcl] Nausea And Vomiting    IV Location/Drains/Wounds Patient Lines/Drains/Airways Status   Active Line/Drains/Airways    Name:   Placement date:   Placement time:   Site:   Days:   Peripheral IV 04/03/18 Right Wrist   04/03/18    2117    Wrist   1   Incision (Closed) 06/05/13 N/A Other (Comment)   06/05/13    1242     1764   Incision (Closed) 10/28/14 Back   10/28/14    1056     1254          Labs/Imaging Results for orders placed or performed during the hospital encounter of 04/03/18 (from the past 48 hour(s))  Blood gas, arterial ([facility] & [facility] ONLY)     Status: Abnormal   Collection Time: 04/03/18  7:45 PM  Result Value Ref Range   O2 Content 2.0 L/min   Delivery systems NASAL CANNULA    LHR 16 resp/min   pH, Arterial 7.386 7.350 - 7.450   pCO2 arterial 35.8 32.0 - 48.0 mmHg   pO2, Arterial 76.9 (L) 83.0 - 108.0 mmHg   Bicarbonate 21.1 20.0 - 28.0 mmol/L   Acid-base deficit 2.9 (H) 0.0 - 2.0 mmol/L   O2 Saturation 94.9 %   Patient temperature 98.3    Collection  site RIGHT BRACHIAL    Drawn by [phone]    Sample type ARTERIAL DRAW     Comment: Performed at [facility] [address] [place], [place] [address]  CBC     Status: Abnormal   Collection Time: 04/03/18  8:34 PM  Result Value Ref Range   WBC 19.8 (H) 4.0 - 10.5 K/uL   RBC 4.26 3.87 - 5.11 MIL/uL   Hemoglobin 12.7 12.0 - 15.0 g/dL   HCT 39.9 36.0 - 46.0 %   MCV 93.7 80.0 - 100.0 fL   MCH 29.8 26.0 - 34.0 pg   MCHC 31.8 30.0 - 36.0 g/dL   RDW 14.0 11.5 - 15.5 %   Platelets 392 150 - 400 K/uL   nRBC 0.0 0.0 - 0.2 %    Comment: Performed at [facility], [place] [address], [place], [place] [address]  Troponin I - Now Then Q3H     Status: None   Collection Time: 04/03/18  8:34 PM  Result Value Ref Range   Troponin I <0.03 <0.03 ng/mL    Comment: Performed at [facility], [place] [address] [place], [place] [address]  Comprehensive  metabolic panel     Status: Abnormal   Collection Time: 04/03/18  8:34 PM  Result Value Ref Range   Sodium 133 (L) 135 - 145 mmol/L   Potassium 5.2 (H) 3.5 - 5.1 mmol/L   Chloride 101 98 - 111 mmol/L   CO2 21 (L) 22 - 32 mmol/L   Glucose, Bld 111 (H) 70 - 99 mg/dL   BUN 39 (H) 8 - 23 mg/dL   Creatinine, Ser 1.97 (H) 0.44 - 1.00 mg/dL   Calcium 9.3 8.9 - 10.3 mg/dL   Total Protein 7.1 6.5 - 8.1 g/dL   Albumin 3.9 3.5 - 5.0 g/dL   AST 46 (H) 15 - 41 U/L   ALT 21 0 - 44 U/L   Alkaline Phosphatase 77 38 - 126 U/L   Total Bilirubin 0.8 0.3 - 1.2 mg/dL   GFR calc non Af Amer 24 (L) >60 mL/min   GFR calc Af Amer 28 (L) >60 mL/min   Anion gap 11 5 - 15    Comment: Performed at [facility], [place] [address], [place], [place] [address]  Lipase, blood     Status: None   Collection Time: 04/03/18  8:34 PM  Result Value Ref Range   Lipase 28 11 - 51 U/L    Comment: Performed at [facility], [place] [address], [place],  [address]  Urinalysis, Routine w reflex microscopic     Status: None   Collection Time: 04/03/18  8:34 PM  Result Value Ref Range   Color, Urine YELLOW YELLOW   APPearance CLEAR CLEAR   Specific Gravity, Urine 1.013 1.005 - 1.030   pH 5.0 5.0 - 8.0   Glucose, UA NEGATIVE NEGATIVE mg/dL   Hgb urine dipstick NEGATIVE NEGATIVE   Bilirubin Urine NEGATIVE NEGATIVE   Ketones, ur NEGATIVE NEGATIVE mg/dL   Protein, ur NEGATIVE NEGATIVE mg/dL   Nitrite NEGATIVE NEGATIVE   Leukocytes, UA NEGATIVE NEGATIVE    Comment: Performed at [facility] [address], [place],  [address]  Influenza panel by PCR (type A & B)     Status: None   Collection Time: 04/03/18  9:42 PM  Result Value Ref Range   Influenza A By PCR NEGATIVE NEGATIVE   Influenza B By PCR  NEGATIVE NEGATIVE    Comment: (NOTE) The Xpert Xpress Flu assay is intended as an aid in the diagnosis of  influenza and should not be used as a sole basis for  treatment.  This  assay is FDA approved for nasopharyngeal swab specimens only. Nasal  washings and aspirates are unacceptable for Xpert Xpress Flu testing. Performed at [facility], [place] [address], [place], [place] [address]   Blood culture (routine x 2)     Status: None (Preliminary result)   Collection Time: 04/03/18  9:43 PM  Result Value Ref Range   Specimen Description      BLOOD RIGHT WRIST Performed at [facility] [address] [place], [place] [address]    Special Requests      BOTTLES DRAWN AEROBIC AND ANAEROBIC Blood Culture adequate volume Performed at [facility] [address] [place], [place] [address]    Culture PENDING    Report Status PENDING   I-Stat CG4 Lactic Acid, ED     Status: None   Collection Time: 04/03/18  9:57 PM  Result Value Ref Range   Lactic Acid, Venous 0.94 0.5 - 1.9 mmol/L  Respiratory Panel by PCR     Status: None   Collection Time: 04/04/18 12:17 AM  Result Value Ref Range   Adenovirus NOT DETECTED NOT DETECTED   Coronavirus 229E NOT DETECTED NOT DETECTED   Coronavirus HKU1 NOT DETECTED NOT DETECTED   Coronavirus NL63 NOT DETECTED NOT DETECTED   Coronavirus OC43 NOT DETECTED NOT DETECTED   Metapneumovirus NOT DETECTED NOT DETECTED   Rhinovirus / Enterovirus NOT DETECTED NOT DETECTED   Influenza A NOT DETECTED NOT DETECTED   Influenza B NOT DETECTED NOT DETECTED   Parainfluenza Virus 1 NOT DETECTED NOT DETECTED   Parainfluenza Virus 2 NOT DETECTED NOT DETECTED   Parainfluenza Virus 3 NOT DETECTED NOT DETECTED   Parainfluenza Virus 4 NOT DETECTED NOT DETECTED   Respiratory Syncytial Virus NOT DETECTED NOT DETECTED   Bordetella pertussis NOT DETECTED NOT DETECTED   Chlamydophila pneumoniae NOT DETECTED NOT DETECTED   Mycoplasma pneumoniae NOT DETECTED NOT DETECTED    Comment: Performed at [facility], [place] [address], [place], [place] [address]  Basic metabolic panel     Status:  Abnormal   Collection Time: 04/04/18  5:11 AM  Result Value Ref Range   Sodium 137 135 - 145 mmol/L   Potassium 4.6 3.5 - 5.1 mmol/L   Chloride 110 98 - 111 mmol/L   CO2 16 (L) 22 - 32 mmol/L   Glucose, Bld 100 (H) 70 - 99 mg/dL   BUN 30 (H) 8 - 23 mg/dL   Creatinine, Ser 1.24 (H) 0.44 - 1.00 mg/dL   Calcium 8.0 (L) 8.9 - 10.3 mg/dL   GFR calc non Af Amer 43 (L) >60 mL/min   GFR calc Af Amer 50 (L) >60 mL/min   Anion gap 11 5 - 15    Comment: Performed at [facility] [address], [place], [place] [address]  CBC     Status: Abnormal   Collection Time: 04/04/18  5:11 AM  Result Value Ref Range   WBC 19.5 (H) 4.0 - 10.5 K/uL   RBC 3.81 (L) 3.87 - 5.11 MIL/uL   Hemoglobin 11.3 (L) 12.0 - 15.0 g/dL   HCT 35.8 (L) 36.0 - 46.0 %   MCV 94.0 80.0 - 100.0 fL   MCH 29.7 26.0 - 34.0 pg  MCHC 31.6 30.0 - 36.0 g/dL   RDW 14.0 11.5 - 15.5 %   Platelets 338 150 - 400 K/uL   nRBC 0.0 0.0 - 0.2 %    Comment: Performed at [facility], [place] [address], [place], [place] [address]   Dg Chest Portable 1 View  Result Date: 04/03/2018 CLINICAL DATA:  Altered mental status EXAM: PORTABLE CHEST 1 VIEW COMPARISON:  03/30/2018, 06/30/2016 FINDINGS: Streaky bibasilar atelectasis. No focal consolidation or effusion. Normal cardiomediastinal silhouette with aortic atherosclerosis. No pneumothorax. Old distal right clavicle fracture. IMPRESSION: Low lung volumes with hazy bibasilar atelectasis. Electronically Signed   By: Donavan Foil M.D.   On: 04/03/2018 19:30   None  Pending Labs Unresulted Labs (From admission, onward)    Start     Ordered   04/04/18 0056  HIV antibody  Once,   R     04/04/18 0056   04/03/18 2143  Blood culture (routine x 2)  BLOOD CULTURE X 2,   STAT     04/03/18 2143          Vitals/Pain Today's Vitals   04/04/18 0632 04/04/18 0700 04/04/18 0733 04/04/18 0734  BP: 138/73 (!) 144/69    Pulse: 98 (!) 103    Resp: 14 14    Temp:       TempSrc:      SpO2: 96% 96% 96%   PainSc:    0-No pain    Isolation Precautions Droplet precaution  Medications Medications  0.9 %  sodium chloride infusion ( Intravenous Restarted 04/04/18 0223)  acetaminophen (TYLENOL) tablet 650 mg (has no administration in time range)    Or  acetaminophen (TYLENOL) suppository 650 mg (has no administration in time range)  ondansetron (ZOFRAN) tablet 4 mg (has no administration in time range)    Or  ondansetron (ZOFRAN) injection 4 mg (has no administration in time range)  enoxaparin (LOVENOX) injection 40 mg (has no administration in time range)  alum & mag hydroxide-simeth (MAALOX/MYLANTA) 200-200-20 MG/5ML suspension 30 mL (has no administration in time range)  atorvastatin (LIPITOR) tablet 20 mg (has no administration in time range)  clopidogrel (PLAVIX) tablet 75 mg (has no administration in time range)  gabapentin (NEURONTIN) capsule 300 mg (300 mg Oral Not Given 04/04/18 0552)  guaiFENesin (ROBITUSSIN) 100 MG/5ML solution 200 mg (has no administration in time range)  loperamide (IMODIUM) capsule 2 mg (has no administration in time range)  magnesium hydroxide (MILK OF MAGNESIA) suspension 30 mL (has no administration in time range)  mirtazapine (REMERON) tablet 30 mg (30 mg Oral Not Given 04/04/18 0553)  oxybutynin (DITROPAN) tablet 5 mg (has no administration in time range)  traMADol (ULTRAM) tablet 50 mg (has no administration in time range)  traZODone (DESYREL) tablet 50 mg (has no administration in time range)  mometasone-formoterol (DULERA) 200-5 MCG/ACT inhaler 1 puff (1 puff Inhalation Not Given 04/04/18 0128)  albuterol (PROVENTIL) (2.5 MG/3ML) 0.083% nebulizer solution 2.5 mg (has no administration in time range)  umeclidinium bromide (INCRUSE ELLIPTA) 62.5 MCG/INH 1 puff (has no administration in time range)  OLANZapine (ZYPREXA) tablet 7.5 mg (has no administration in time range)  sodium chloride 0.9 % bolus 1,000 mL (0 mLs  Intravenous Stopped 04/03/18 2306)  ipratropium-albuterol (DUONEB) 0.5-2.5 (3) MG/3ML nebulizer solution 3 mL (3 mLs Nebulization Given 04/04/18 0022)  sodium chloride 0.9 % bolus 500 mL (0 mLs Intravenous Stopped 04/04/18 0222)    Mobility walks with device

## 2018-04-04 NOTE — ED Notes (Signed)
PT RETURNS TO ROOM

## 2018-04-04 NOTE — H&P (Signed)
History and Physical    [name] QJJ:[id number] DOB: [birth year] DOA: 04/03/2018  PCP: Patient, No Pcp Per  Patient coming from: SNF  I have personally briefly reviewed patient's old medical records in [place]  Chief Complaint: SOB  HPI: [name] is a 73 y.o. female with medical history significant of vascular dementia, prior stroke, HTN, COPD not on O2 at baseline.  Patient resides in SNF.  Patient has had multiple falls this week and ED visits on 1/5 and 1/8.  Work up yesterday including CT head and wrist X rays.  Today patient developed reported SOB, EMS was called.  Per EMS the SNF reported patients color was bad, she appeared short of breath and her O2 sats were low.   ED Course: On arrival to the ED she didn't appear short of breath.  She does state she had some CP previously and has some abd pain.  Is hypoxic with new 2L O2 requirement.  No respiratory distress.  Abd exam unimpressive, CXR neg.  Influenza PCR neg.  RN reported to me that she had some tachycardia at one point in the ED but this appears resolved at this time and she is Sinus at a rate of 80.  Also had transient hypotension into the 90s that resolved with 1L NS.   Review of Systems: Unable to perform due to dementia.  Past Medical History:  Diagnosis Date  . Carotid artery aneurysm ([place]) 03/04/2012  . COPD (chronic obstructive pulmonary disease) ([place])   . Depression   . DJD (degenerative joint disease)   . GERD (gastroesophageal reflux disease)   . Hypertension   . Stroke [facility])    was on Plavix - not now  . Vascular dementia [facility])     Past Surgical History:  Procedure Laterality Date  . CATARACT EXTRACTION Bilateral   . COLONOSCOPY  2004  . HERNIA REPAIR Right 2014   inguinal  . KYPHOPLASTY N/A 10/28/2014   Procedure: KYPHOPLASTY;  Surgeon: Phylliss Bob, MD;  Location: [facility];  Service: Orthopedics;  Laterality: N/A;  Lumbar 1 kyphoplasty  . MICROLARYNGOSCOPY WITH CO2 LASER AND  EXCISION OF VOCAL CORD LESION N/A 06/05/2013   Procedure: MICROLARYNGOSCOPY WITH CO2 LASER AND EXCISION OF VOCAL CORD POLYPS;  Surgeon: Melida Quitter, MD;  Location: [facility];  Service: ENT;  Laterality: N/A;  . TONSILLECTOMY    . TUBAL LIGATION  30 years  . VIDEO BRONCHOSCOPY  04/24/2012   Procedure: VIDEO BRONCHOSCOPY WITHOUT FLUORO;  Surgeon: Rigoberto Noel, MD;  Location: [facility];  Service: Cardiopulmonary;  Laterality: Bilateral;     reports that she has quit smoking. Her smoking use included cigarettes. She has a 7.50 pack-year smoking history. She has never used smokeless tobacco. She reports that she does not drink alcohol or use drugs.  Allergies  Allergen Reactions  . Amoxapine And Related Nausea And Vomiting  . Amoxicillin     itching  . Ciprocinonide [Fluocinolone]     Pt unsure  . Ciprofloxacin     Pt unsure  . Doxycycline Nausea And Vomiting  . Erythromycin Hives  . Sulfa Antibiotics Other (See Comments)    Unknown-reaction as a child  . Zofran [Ondansetron Hcl] Nausea And Vomiting    Family History  Problem Relation Age of Onset  . Breast cancer Mother   . Breast cancer Sister      Prior to Admission medications   Medication Sig Start Date End Date Taking? Authorizing Provider  alum & [employer]  hydroxide-simeth ([place]) 200-200-20 MG/5ML suspension Take 30 mLs by mouth every 6 (six) hours as needed for indigestion or heartburn.   Yes [provider]  amLODipine (NORVASC) 5 MG tablet Take 5 mg by mouth daily. 06/12/16  Yes [provider]  atorvastatin (LIPITOR) 20 MG tablet Take 1 tablet (20 mg total) by mouth daily at 6 PM. 02/29/16  Yes Reyne Dumas, MD  b complex vitamins tablet Take 1 tablet by mouth daily. 01/25/16  Yes Rolene Course, PA-C  clopidogrel (PLAVIX) 75 MG tablet Take 75 mg by mouth daily.   Yes [provider]  gabapentin (NEURONTIN) 300 MG capsule Take 300 mg by mouth at bedtime. 06/12/16  Yes [provider]    loperamide (IMODIUM) 2 MG capsule Take 2 mg by mouth daily as needed for diarrhea or loose stools.   Yes [provider]  losartan (COZAAR) 100 MG tablet Take 100 mg by mouth daily. 06/13/16  Yes [provider]  magnesium hydroxide (MILK OF MAGNESIA) 400 MG/5ML suspension Take 30 mLs by mouth at bedtime as needed for mild constipation.   Yes [provider]  mirtazapine (REMERON) 30 MG tablet Take 30 mg by mouth at bedtime. 06/28/16  Yes [provider]  OLANZapine (ZYPREXA) 15 MG tablet Take 7.5 mg by mouth 2 (two) times daily.    Yes [provider]  oxybutynin (DITROPAN) 5 MG tablet TAKE 1/2 TO 1 TABLET BY MOUTH THREE TIMES DAILY FOR BLADDER CONTROL Patient taking differently: Take 5 mg by mouth 3 (three) times daily.  04/23/16  Yes Unk Pinto, MD  traMADol (ULTRAM) 50 MG tablet Take 1 tablet (50 mg total) by mouth 2 (two) times daily as needed. Patient taking differently: Take 50 mg by mouth 3 (three) times daily.  02/29/16  Yes Reyne Dumas, MD  traZODone (DESYREL) 50 MG tablet Take 50 mg by mouth at bedtime as needed for sleep.   Yes [provider]  vitamin C (ASCORBIC ACID) 500 MG tablet Take 500 mg by mouth daily.   Yes [provider]  acetaminophen (TYLENOL) 500 MG tablet Take 500 mg by mouth every 4 (four) hours as needed for mild pain.    [provider]  guaiFENesin (ROBITUSSIN) 100 MG/5ML SOLN Take 10 mLs by mouth every 6 (six) hours as needed for cough or to loosen phlegm.    [provider]  trolamine salicylate (ASPERCREME) 10 % cream Apply 1 application topically 2 (two) times daily as needed for muscle pain.     [provider]    Physical Exam: Vitals:   04/03/18 2028 04/03/18 2200 04/03/18 2300 04/04/18 0000  BP: (!) 96/58 111/68 140/76 131/72  Pulse: 92 86 (!) 102 98  Resp: 12 16 20 14   Temp: 98.9 F (37.2 C)     TempSrc: Oral     SpO2: 96% 99% 93% 94%    Constitutional:  NAD, calm, comfortable Eyes: PERRL, lids and conjunctivae normal ENMT: Mucous membranes are moist. Posterior pharynx clear of any exudate or lesions.Normal dentition.  Neck: normal, supple, no masses, no thyromegaly Respiratory: clear to auscultation bilaterally, no wheezing, no crackles. Normal respiratory effort. No accessory muscle use.  Cardiovascular: Regular rate and rhythm, no murmurs / rubs / gallops. No extremity edema. 2+ pedal pulses. No carotid bruits.  Abdomen: Mild TTP epigastric area Musculoskeletal: R shoulder in sling, L wrist in brace Skin: no rashes, lesions, ulcers. No induration Neurologic: CN 2-12 grossly intact. Sensation intact, DTR normal. Strength 5/5  in all 4.  Psychiatric: Demented, asking to use our phone but when I got it for her and offered assistance in dialing; she doesn't know what number she wants to [place] on Admission: I have personally reviewed following labs and imaging studies  CBC: Recent Labs  Lab 03/30/18 1329 04/03/18 2034  WBC 11.8* 19.8*  NEUTROABS 7.7  --   HGB 13.1 12.7  HCT 40.1 39.9  MCV 89.7 93.7  PLT 383 098   Basic Metabolic Panel: Recent Labs  Lab 03/30/18 1329 04/03/18 2034  NA 133* 133*  K 4.2 5.2*  CL 103 101  CO2 21* 21*  GLUCOSE 117* 111*  BUN 15 39*  CREATININE 0.83 1.97*  CALCIUM 9.4 9.3   GFR: CrCl cannot be calculated (Unknown ideal weight.). Liver Function Tests: Recent Labs  Lab 03/30/18 1329 04/03/18 2034  AST 21 46*  ALT 18 21  ALKPHOS 78 77  BILITOT 0.6 0.8  PROT 6.6 7.1  ALBUMIN 3.5 3.9   Recent Labs  Lab 04/03/18 2034  LIPASE 28   No results for input(s): AMMONIA in the last 168 hours. Coagulation Profile: No results for input(s): INR, PROTIME in the last 168 hours. Cardiac Enzymes: Recent Labs  Lab 04/03/18 2034  TROPONINI <0.03   BNP (last 3 results) No results for input(s): PROBNP in the last 8760 hours. HbA1C: No results for input(s): HGBA1C in the last 72  hours. CBG: No results for input(s): GLUCAP in the last 168 hours. Lipid Profile: No results for input(s): CHOL, HDL, LDLCALC, TRIG, CHOLHDL, LDLDIRECT in the last 72 hours. Thyroid Function Tests: No results for input(s): TSH, T4TOTAL, FREET4, T3FREE, THYROIDAB in the last 72 hours. Anemia Panel: No results for input(s): VITAMINB12, FOLATE, FERRITIN, TIBC, IRON, RETICCTPCT in the last 72 hours. Urine analysis:    Component Value Date/Time   COLORURINE YELLOW 04/03/2018 2034   APPEARANCEUR CLEAR 04/03/2018 2034   LABSPEC 1.013 04/03/2018 2034   PHURINE 5.0 04/03/2018 2034   GLUCOSEU NEGATIVE 04/03/2018 2034   HGBUR NEGATIVE 04/03/2018 2034   BILIRUBINUR NEGATIVE 04/03/2018 2034   [place] NEGATIVE 04/03/2018 2034   PROTEINUR NEGATIVE 04/03/2018 2034   UROBILINOGEN 0.2 10/26/2014 1503   NITRITE NEGATIVE 04/03/2018 2034   LEUKOCYTESUR NEGATIVE 04/03/2018 2034    Radiological Exams on Admission: Dg Wrist Complete Left  Result Date: 04/02/2018 CLINICAL DATA:  Unwitnessed fall this morning with left wrist deformity. EXAM: LEFT WRIST - COMPLETE 3+ VIEW COMPARISON:  Radiographs 04/24/2016 FINDINGS: Remote distal radius fracture has healed. Mild residual posttraumatic deformity. Disruption of the distal radioulnar joint with ulna positive variance appears chronic and similar to prior. No acute fracture. The bones are under mineralized. IMPRESSION: 1. No acute fracture of the left wrist. 2. Remote distal radius fracture with healed posttraumatic deformity. Ulna positive variance was seen on prior exam. Electronically Signed   By: Keith Rake M.D.   On: 04/02/2018 01:45   Ct Head Wo Contrast  Result Date: 04/02/2018 CLINICAL DATA:  Post unwitnessed fall with laceration to posterior head. Head trauma, intracranial venous injury suspected EXAM: CT HEAD WITHOUT CONTRAST TECHNIQUE: Contiguous axial images were obtained from the base of the skull through the vertex without intravenous contrast.  COMPARISON:  Head CT 06/29/2016 FINDINGS: Brain: No intracranial hemorrhage, mass effect, or midline shift. Stable degree of atrophy and chronic small vessel ischemia. Remote left temporoparietal infarcts with associated ex vacuo dilatation of the left lateral ventricle. Remote lacunar infarcts in bilateral basal ganglia. No evidence of  territorial infarct or acute ischemia. No extra-axial or intracranial fluid collection. Vascular: Atherosclerosis of skullbase vasculature without hyperdense vessel or abnormal calcification. Skull: No fracture or focal lesion. Sinuses/Orbits: Paranasal sinuses and mastoid air cells are clear. The visualized orbits are unremarkable. Bilateral cataract resection. Other: Small posterior left parietal scalp hematoma. IMPRESSION: 1. Small posterior left parietal scalp hematoma. No acute intracranial abnormality. No skull fracture. 2. Stable atrophy and chronic small vessel ischemia. Unchanged remote infarcts in left MCA distribution and bilateral basal ganglia. Electronically Signed   By: Keith Rake M.D.   On: 04/02/2018 01:52   Dg Chest Portable 1 View  Result Date: 04/03/2018 CLINICAL DATA:  Altered mental status EXAM: PORTABLE CHEST 1 VIEW COMPARISON:  03/30/2018, 06/30/2016 FINDINGS: Streaky bibasilar atelectasis. No focal consolidation or effusion. Normal cardiomediastinal silhouette with aortic atherosclerosis. No pneumothorax. Old distal right clavicle fracture. IMPRESSION: Low lung volumes with hazy bibasilar atelectasis. Electronically Signed   By: Donavan Foil M.D.   On: 04/03/2018 19:30    EKG: Independently reviewed.  Assessment/Plan Principal Problem:   Hypoxia Active Problems:   Essential hypertension   Vascular dementia without behavioral disturbance ([name])   AKI (acute kidney injury) ([place])    1. Hypoxia - 1. Satting well on 2L via [place] 2. Adult wheeze protocol 3. But lungs dont really sound that bad, certianly dont see need for ABx or steroids at  this point. 4. COPD pathway for scheduled and PRN nebs 2. AKI - 1. Possibly due to transient hypotension / pre-renal? 2. IVF: 1L bolus and 125 cc/hr 3. Holding norvasc and losartan 4. Strict intake and output 5. Repeat BMP in AM 6. Further work up and consults if not improving 3. HTN - 1. holding home BP meds given the apparent transient hypotension 2. Will continue IVF at 125 cc/hr for now 4. Vascular dementia - 1. Continue home meds  DVT prophylaxis: Lovenox Code Status: Full Family Communication: No family in room Disposition Plan: SNF after admit Consults called: None Admission status: Place in obs   [place], [place] [facility] Pager [phone] Only works nights!  If 7AM-7PM, please contact the primary day team physician taking care of patient  [email or web address] Password [email or web address]  04/04/2018, 12:45 AM

## 2018-04-04 NOTE — ED Notes (Addendum)
TRANSPORTED TO NUCLEAR MED. [phone]

## 2018-04-04 NOTE — Progress Notes (Signed)
OT Cancellation Note  Patient Details Name: [name] MRN: [id number] DOB: November 18, 1945   Cancelled Treatment:    Reason Eval/Treat Not Completed: Other (comment). Noted pt from ALF, with limited mobility and assist. Will sign off.  Mystery Schrupp 04/04/2018, 4:02 PM  Lesle Chris, OTR/L Acute Rehabilitation Services [phone] [facility] pager [phone] office 04/04/2018

## 2018-04-04 NOTE — Progress Notes (Addendum)
Issues seen and examined with the ED nurse Lassiter, Nicola Girt, RN  [name] is a 73 y.o. female with medical history significant of vascular dementia, prior stroke, HTN, COPD not on O2 at baseline.  Patient resides in SNF. Patient brought in from SNF for several reasons , patient has had multiple falls, according to the daughter 3 falls in the last 2 weeks. She was diagnosed with a closed fracture of the distal end of the left radius on 1/8, she was noted to have a laceration of the occipital scalp as well as a traumatic heme or hematoma of the occiput on CT. On 1/5 patient was noted to have  comminuted mildly displaced lateral clavicular shaft fracture. Placed in shoulder immobilizer while in the emergency department.   Patient brought in yesterday evening because of reports of shortness of breath and fever, tachycardia, new oxygen requirements,patient has underlying dementia and is not able to give any meaningful history.  She was noted to have SIRS physiology with fever of 100.7, tachycardia with pulse up to 125, AMS , diarrhea in the ED. Patient also found to have acute kidney injury with a creatinine of 1.97. White count of 19.5. Influenza panel was negative. UA negative.respiratory panel negative.  Plan Given hypoxia, low-grade fever, tachycardia, AKI,  CT of the chest without contrast showed emphysematous changes noted without acute abnormality. VQ scan to rule out a PE was low probability  Start patient on empiric antibiotics for COPD exacerbation, nebulizer treatments Continue IV fluids She is acutely ill with possible sepsis, will change to inpatient  Discussed with patient's daughter who lives in [place] , twice today She stated that mother is a DNR and she will be transitioned to hospice at [facility] when she is discharged from [facility]

## 2018-04-04 NOTE — ED Notes (Signed)
Alexis in the lab to add on respiratory panel

## 2018-04-04 NOTE — ED Notes (Signed)
TOMI : [facility]  [phone] REFERRAL FROM [facility] DAUGHTER HAS CONSENTED FOR TREATMENT PLAN.  WILL COME SEE PATIENT TODAY

## 2018-04-04 NOTE — Evaluation (Signed)
Physical Therapy Evaluation Patient Details Name: [name] MRN: [id number] DOB: 03-13-1946 Today's Date: 04/04/2018   History of Present Illness  [name] is a 73 y.o. female with medical history significant of vascular dementia, prior stroke, HTN, COPD not on O2 at baseline.  Patient resides in ALF;  admitted  with  hypoxia, recent falls, L distal radius fx,  and head laceration; VQ scan neg for PE  Clinical Impression  Patient evaluated by Physical Therapy with no further acute PT needs identified. All education has been completed and the patient has no further questions.  See below for any follow-up Physical Therapy or equipment needs. PT is signing off. Thank you for this referral. Limited eval d/t pt with elevated pain with initiation of movement, baseline dementia although pt follows one step functional commands most of session; per visitor from [place] the plan has been confirmed per pt dtr that pt is to return to ALF [facility]) with Hospice services, therefore no f/u PT will be recommended at this point;      Follow Up Recommendations No PT follow up    Equipment Recommendations  None recommended by PT    Recommendations for Other Services       Precautions / Restrictions Precautions Precautions: Fall      Mobility  Bed Mobility Overal bed mobility: Needs Assistance Bed Mobility: Supine to Sit;Sit to Supine     Supine to sit: Max assist Sit to supine: Max assist;Mod assist   General bed mobility comments: assist to elevate and lower trunk, assist to bring LEs off bed, pt with incr effort, lifting LEs back into supine position; movement is painful even with incr assist  Transfers                 General transfer comment: deferred with +1 and d/t incr pain  Ambulation/Gait                Stairs            Wheelchair Mobility    Modified Rankin (Stroke Patients Only)       Balance Overall balance assessment: Needs  assistance   Sitting balance-Leahy Scale: Poor         Standing balance comment: NT                             Pertinent Vitals/Pain Pain Assessment: Faces Faces Pain Scale: Hurts worst Pain Location: L UE/shoulder with movement Pain Descriptors / Indicators: Grimacing;Guarding Pain Intervention(s): Monitored during session;Repositioned    Home Living Family/patient expects to be discharged to:: Assisted living                      Prior Function           Comments: pt resides at [facility] ALF, baselien dementia, recent falls, limited mobility; no family present at time of eval,pt is unable to provide hx     Hand Dominance        Extremity/Trunk Assessment   Upper Extremity Assessment Upper Extremity Assessment: LUE deficits/detail LUE Deficits / Details: not fully tested d/t pain and wrist splint; grimacign and guardign noted with any movement    Lower Extremity Assessment Lower Extremity Assessment: Generalized weakness       Communication      Cognition Arousal/Alertness: Awake/alert Behavior During Therapy: Anxious Overall Cognitive Status: History of cognitive impairments - at baseline  General Comments      Exercises     Assessment/Plan    PT Assessment Patent does not need any further PT services  PT Problem List         PT Treatment Interventions      PT Goals (Current goals can be found in the Care Plan section)  Acute Rehab PT Goals PT Goal Formulation: Patient unable to participate in goal setting    Frequency     Barriers to discharge        Co-evaluation               AM-PAC PT "6 Clicks" Mobility  Outcome Measure Help needed turning from your back to your side while in a flat bed without using bedrails?: A Lot Help needed moving from lying on your back to sitting on the side of a flat bed without using bedrails?: A Lot Help needed  moving to and from a bed to a chair (including a wheelchair)?: Total Help needed standing up from a chair using your arms (e.g., wheelchair or bedside chair)?: Total Help needed to walk in hospital room?: Total Help needed climbing 3-5 steps with a railing? : Total 6 Click Score: 8    End of Session   Activity Tolerance: Patient limited by pain Patient left: in bed;with call bell/phone within reach;with bed alarm set   PT Visit Diagnosis: Muscle weakness (generalized) (M62.81)    Time: 2023-3435 PT Time Calculation (min) (ACUTE ONLY): 18 min   Charges:   PT Evaluation $PT Eval Low Complexity: 1 Low          Kenyon Ana, PT  Pager: [phone] Acute Rehab Dept [facility]): [phone]   04/04/2018   [facility] 04/04/2018, 3:05 PM

## 2018-04-04 NOTE — Progress Notes (Signed)
Initial Nutrition Assessment  DOCUMENTATION CODES:   Not applicable  INTERVENTION:  -Ensure Enlive po BID, each supplement provides 350 kcal and 20 grams of protein (chocolate) -Magic cup TID with meals, each supplement provides 290 kcal and 9 grams of protein (chocolate) -Snacks -Patient is already receiving appetite stimulant  NUTRITION DIAGNOSIS:   Inadequate oral intake related to lethargy/confusion, chronic illness(vascular dementia) as evidenced by meal completion < 25%.   GOAL:   Patient will meet greater than or equal to 90% of their needs   MONITOR:   PO intake, Supplement acceptance, Weight trends  REASON FOR ASSESSMENT:   Consult Assessment of nutrition requirement/status  ASSESSMENT:  73 year old female with medical history significant of vascular dementia, stroke, HTN, COPD, recent history of falls with left radial fracture and hematoma of the occiput. Pt  brought in from SNF for SOB, diarrhea, and fever.    Per RN sticky note, patient is currently under care of [facility] and residing at [facility]. Review of notes reveals that patient has declined breakfast and lunch meals.  Patient awake and wanting the television channel changed and RD helped patient find a channel she was interested in. Patient with dementia and unable to provide RD with history. Patient shook her head no when RD asked patient if she was feeling hungry (patient receiving appetite stimulant)    RD went through the menu to assess likes and dislikes and able to gather she likes chocolate, bacon, pudding, and muffins.   Medications reviewed and include: Remeron, ditropan, tramadol No pertinent labs at this time  NUTRITION - FOCUSED PHYSICAL EXAM:  Unable to complete due to AMS   Diet Order:   Diet Order            Diet Heart Room service appropriate? Yes; Fluid consistency: Thin  Diet effective now              EDUCATION NEEDS:   No education needs have been  identified at this time  Skin:  Skin Assessment: Reviewed RN Assessment  Last BM:  1/10 type 4; brown; medium  Height:   Ht Readings from Last 1 Encounters:  06/29/16 5' (1.524 m)    Weight:   Wt Readings from Last 1 Encounters:  04/04/18 50.4 kg    Ideal Body Weight:  45.5 kg  BMI:  Body mass index is 21.7 kg/m.  Estimated Nutritional Needs:   Kcal:  1260-1520  Protein:  60-71g  Fluid:  </=1.2L/day    Lajuan Lines, RD, LDN  After Hours/Weekend Pager: [phone]

## 2018-04-04 NOTE — Progress Notes (Signed)
Patient c/o rectal pain; on assessment pt was noted with hard stool in the rectum. MD notified, order for fecal disimpaction  was obtained and carried out. Large hard stools removed.

## 2018-04-04 NOTE — ED Notes (Signed)
PT DECLINES BREAKFAST

## 2018-04-04 NOTE — ED Notes (Signed)
ADMISSION MD ABROL IN TO SEE PT. MADE AWARE OF CURRENT PT STATUS. AWARE OF CURRENT VITAL SIGNS. ELEVATED HR AND TEMP. AWARE OF DIARRHEA. AWARE OF PT NOT WEARING SLING AND DESIRE TO NOT WEAR L WRIST SPLINT.  ORDER TO AMBULATE IN HALLWAY. AT PRESENT ORDER NOT TO PERFORM UNTIL PATIENT UPSTAIRS. MADE AWARE NOT ORDER FOR OT OR PT AT THIS TIME. NO ORDER GIVEN.  MD GIVEN DAUGHTER'S TELEPHONE NUMBER AND REQUESTED TO CALL HER FOR UPDATE.

## 2018-04-05 DIAGNOSIS — R6889 Other general symptoms and signs: Secondary | ICD-10-CM

## 2018-04-05 DIAGNOSIS — J209 Acute bronchitis, unspecified: Secondary | ICD-10-CM

## 2018-04-05 DIAGNOSIS — J9621 Acute and chronic respiratory failure with hypoxia: Secondary | ICD-10-CM

## 2018-04-05 LAB — CBC
HCT: 33.6 % — ABNORMAL LOW (ref 36.0–46.0)
Hemoglobin: 10.9 g/dL — ABNORMAL LOW (ref 12.0–15.0)
MCH: 30.6 pg (ref 26.0–34.0)
MCHC: 32.4 g/dL (ref 30.0–36.0)
MCV: 94.4 fL (ref 80.0–100.0)
NRBC: 0 % (ref 0.0–0.2)
Platelets: 299 10*3/uL (ref 150–400)
RBC: 3.56 MIL/uL — ABNORMAL LOW (ref 3.87–5.11)
RDW: 14 % (ref 11.5–15.5)
WBC: 18.9 10*3/uL — AB (ref 4.0–10.5)

## 2018-04-05 LAB — COMPREHENSIVE METABOLIC PANEL
ALT: 22 U/L (ref 0–44)
AST: 44 U/L — ABNORMAL HIGH (ref 15–41)
Albumin: 2.9 g/dL — ABNORMAL LOW (ref 3.5–5.0)
Alkaline Phosphatase: 62 U/L (ref 38–126)
Anion gap: 7 (ref 5–15)
BUN: 12 mg/dL (ref 8–23)
CO2: 21 mmol/L — ABNORMAL LOW (ref 22–32)
Calcium: 7.7 mg/dL — ABNORMAL LOW (ref 8.9–10.3)
Chloride: 110 mmol/L (ref 98–111)
Creatinine, Ser: 0.64 mg/dL (ref 0.44–1.00)
GFR calc Af Amer: >60 mL/min (ref >60)
GFR calc non Af Amer: >60 mL/min (ref >60)
Glucose, Bld: 95 mg/dL (ref 70–99)
POTASSIUM: 3.5 mmol/L (ref 3.5–5.1)
Sodium: 138 mmol/L (ref 135–145)
TOTAL PROTEIN: 5.6 g/dL — AB (ref 6.5–8.1)
Total Bilirubin: 0.8 mg/dL (ref 0.3–1.2)

## 2018-04-05 MED ORDER — POTASSIUM CHLORIDE CRYS ER 20 MEQ PO TBCR
40.0000 meq | EXTENDED_RELEASE_TABLET | Freq: Once | ORAL | Status: AC
  Administered 2018-04-05: 40 meq via ORAL
  Filled 2018-04-05: qty 2

## 2018-04-05 MED ORDER — POLYETHYLENE GLYCOL 3350 17 G PO PACK
17.0000 g | PACK | Freq: Every day | ORAL | Status: DC
  Administered 2018-04-05 – 2018-04-07 (×3): 17 g via ORAL
  Filled 2018-04-05 (×3): qty 1

## 2018-04-05 NOTE — Plan of Care (Signed)
  Problem: Clinical Measurements: Goal: Ability to maintain clinical measurements within normal limits will improve Outcome: Progressing Goal: Diagnostic test results will improve Outcome: Progressing Goal: Respiratory complications will improve Outcome: Progressing Goal: Cardiovascular complication will be avoided Outcome: Progressing   Problem: Nutrition: Goal: Adequate nutrition will be maintained Outcome: Progressing   Problem: Elimination: Goal: Will not experience complications related to bowel motility Outcome: Progressing Goal: Will not experience complications related to urinary retention Outcome: Progressing   Problem: Pain Managment: Goal: General experience of comfort will improve Outcome: Progressing   Problem: Safety: Goal: Ability to remain free from injury will improve Outcome: Progressing   Problem: Skin Integrity: Goal: Risk for impaired skin integrity will decrease Outcome: Progressing

## 2018-04-05 NOTE — Progress Notes (Signed)
PT Cancellation Note  Patient Details Name: [name] MRN: [id number] DOB: 1945/08/20   Please refer to PT evaluation 1/10  ,no further   skilled PT needs. Patient to return to ALF .   Claretha Cooper 04/05/2018, 3:52 PM [place] Pager [phone] Office [phone]

## 2018-04-05 NOTE — Progress Notes (Addendum)
[facility] PROGRESS NOTE  [name] RRN:[id number] DOB: [birth year] DOA: 04/03/2018  PCP: Patient, No Pcp Per  Brief History/Interval Summary: 73 year old Caucasian female with a past medical history of vascular dementia, prior stroke, hypertension, history of COPD.  Was either in assisted living or skilled nursing facility or memory unit.  She has had multiple falls this week with visits to the emergency department.  She presented this time around with shortness of breath.  She was hospitalized due to new oxygen requirements.  Reason for Visit: Acute respiratory failure with hypoxia.  Consultants: None  Procedures: None  Antibiotics: Levofloxacin  Subjective/Interval History: Patient pleasantly confused.  Unable to provide much information.  Apparently was found to have rectal impaction with stool and had to be manually disimpacted overnight.  Has not had any nausea or vomiting.  ROS: Unable to do due to her dementia  Objective:  Vital Signs  Vitals:   04/04/18 1417 04/04/18 1422 04/04/18 2009 04/05/18 0537  BP:  119/73 131/89 107/66  Pulse:  99 (!) 111 98  Resp:  20    Temp:  99.5 F (37.5 C) 99.1 F (37.3 C) 97.9 F (36.6 C)  TempSrc:  Oral Oral Oral  SpO2:  99% 99% 97%  Weight: 50.4 kg       Intake/Output Summary (Last 24 hours) at 04/05/2018 1300 Last data filed at 04/05/2018 0200 Gross per 24 hour  Intake 1262.31 ml  Output -  Net 1262.31 ml   Filed Weights   04/04/18 1417  Weight: 50.4 kg    General appearance: alert, cooperative, appears stated age, distracted and no distress Head: Normocephalic, without obvious abnormality, atraumatic Resp: Mildly tachypneic at rest.  Coarse breath sounds bilaterally.  Scattered wheezes.  No rhonchi. Cardio: regular rate and rhythm, S1, S2 normal, no murmur, click, rub or gallop GI: Noted to be mildly tender in the left abdomen without any rebound rigidity or guarding.  Abdomen is otherwise soft.  No masses  organomegaly.  Bowel sounds are present normal. Extremities: extremities normal, atraumatic, no cyanosis or edema Pulses: 2+ and symmetric Neurologic: Pleasantly confused.  No obvious focal neurological deficits noted.  Lab Results:  Data Reviewed: I have personally reviewed following labs and imaging studies  CBC: Recent Labs  Lab 03/30/18 1329 04/03/18 2034 04/04/18 0511 04/05/18 0531  WBC 11.8* 19.8* 19.5* 18.9*  NEUTROABS 7.7  --   --   --   HGB 13.1 12.7 11.3* 10.9*  HCT 40.1 39.9 35.8* 33.6*  MCV 89.7 93.7 94.0 94.4  PLT 383 392 338 338    Basic Metabolic Panel: Recent Labs  Lab 03/30/18 1329 04/03/18 2034 04/04/18 0511 04/05/18 0531  NA 133* 133* 137 138  K 4.2 5.2* 4.6 3.5  CL 103 101 110 110  CO2 21* 21* 16* 21*  GLUCOSE 117* 111* 100* 95  BUN 15 39* 30* 12  CREATININE 0.83 1.97* 1.24* 0.64  CALCIUM 9.4 9.3 8.0* 7.7*    GFR: CrCl cannot be calculated (Unknown ideal weight.).  Liver Function Tests: Recent Labs  Lab 03/30/18 1329 04/03/18 2034 04/05/18 0531  AST 21 46* 44*  ALT 18 21 22   ALKPHOS 78 77 62  BILITOT 0.6 0.8 0.8  PROT 6.6 7.1 5.6*  ALBUMIN 3.5 3.9 2.9*    Recent Labs  Lab 04/03/18 2034  LIPASE 28   Cardiac Enzymes: Recent Labs  Lab 04/03/18 2034  TROPONINI <0.03     Recent Results (from the past 240 hour(s))  Blood  culture (routine x 2)     Status: None (Preliminary result)   Collection Time: 04/03/18  9:43 PM  Result Value Ref Range Status   Specimen Description   Final    BLOOD RIGHT WRIST Performed at [facility], [address] [place] [address], [place], [place] [address]    Special Requests   Final    BOTTLES DRAWN AEROBIC AND ANAEROBIC Blood Culture adequate volume Performed at [facility] [address], [place], [place] [address]    Culture   Final    NO GROWTH 1 DAY Performed at [facility], [place] [address] [place], [place] [address]    Report Status PENDING  Incomplete  Respiratory  Panel by PCR     Status: None   Collection Time: 04/04/18 12:17 AM  Result Value Ref Range Status   Adenovirus NOT DETECTED NOT DETECTED Final   Coronavirus 229E NOT DETECTED NOT DETECTED Final   Coronavirus HKU1 NOT DETECTED NOT DETECTED Final   Coronavirus NL63 NOT DETECTED NOT DETECTED Final   Coronavirus OC43 NOT DETECTED NOT DETECTED Final   Metapneumovirus NOT DETECTED NOT DETECTED Final   Rhinovirus / Enterovirus NOT DETECTED NOT DETECTED Final   Influenza A NOT DETECTED NOT DETECTED Final   Influenza B NOT DETECTED NOT DETECTED Final   Parainfluenza Virus 1 NOT DETECTED NOT DETECTED Final   Parainfluenza Virus 2 NOT DETECTED NOT DETECTED Final   Parainfluenza Virus 3 NOT DETECTED NOT DETECTED Final   Parainfluenza Virus 4 NOT DETECTED NOT DETECTED Final   Respiratory Syncytial Virus NOT DETECTED NOT DETECTED Final   Bordetella pertussis NOT DETECTED NOT DETECTED Final   Chlamydophila pneumoniae NOT DETECTED NOT DETECTED Final   Mycoplasma pneumoniae NOT DETECTED NOT DETECTED Final    Comment: Performed at [facility], [place] [address] [place], [place] [address]      Radiology Studies: Ct Chest Wo Contrast  Result Date: 04/04/2018 CLINICAL DATA:  Shortness of breath and fevers EXAM: CT CHEST WITHOUT CONTRAST TECHNIQUE: Multidetector CT imaging of the chest was performed following the standard protocol without IV contrast. COMPARISON:  CT of the cervical spine from 04/26/2016, recent chest x-ray from the previous day FINDINGS: Cardiovascular: Somewhat limited due to lack of IV contrast. Diffuse atherosclerotic calcifications are seen without aneurysmal dilatation. No cardiac enlargement is noted. No pericardial effusion is seen. Coronary calcifications are noted. Mediastinum/Nodes: Thoracic inlet is within normal limits. No hilar or mediastinal adenopathy is noted. The esophagus is within normal limits with the exception of a sliding-type hiatal hernia. Lungs/Pleura: Diffuse  emphysematous changes are noted. Scarring is noted in the apices bilaterally stable from a prior CT of the cervical spine. No focal infiltrate or sizable effusion is noted. No sizable parenchymal nodules are seen. No pneumothorax is noted. Upper Abdomen: No acute abnormality in the upper abdomen is seen. Musculoskeletal: Degenerative changes of the thoracic spine are noted. T6 compression deformity is noted stable from a prior exam from 2016. Changes of prior augmentation at L1 are seen. IMPRESSION: Chronic scarring in the apices bilaterally. Underlying emphysematous changes noted without acute abnormality. Small sliding-type hiatal hernia. Chronic changes in the thoracic spine. Aortic Atherosclerosis (ICD10-I70.0) and Emphysema (ICD10-J43.9). Electronically Signed   By: Inez Catalina M.D.   On: 04/04/2018 13:05   Nm Pulmonary Perf And Vent  Result Date: 04/04/2018 CLINICAL DATA:  Shortness of breath and fever.  Tachycardia. EXAM: NUCLEAR MEDICINE VENTILATION - PERFUSION LUNG SCAN VIEWS: Anterior, posterior, left lateral, right lateral,  RPO, LPO, RAO, LAO-ventilation and perfusion RADIOPHARMACEUTICALS:  31.5 mCi of Tc-38m DTPA aerosol inhalation and 4.4 mCi Tc42m MAA IV COMPARISON:  Chest radiograph April 03, 2024; chest CT April 04, 2018 FINDINGS: Ventilation: There is decreased radiotracer uptake in the lung apex regions bilaterally in a nonsegmental distribution. Elsewhere, ventilation uptake is symmetric bilaterally. No segmental level ventilation defects are evident. Perfusion: There Is mild decreased uptake in the apices in a distribution which matches a ventilation defects in the apices. No segmental level perfusion defect is evident. Note that on CT, there is opacity in each lung apex, more severe on the right than on the left which corresponds to the areas of decreased radiotracer uptake on the ventilation and perfusion studies. IMPRESSION: Matching apical defects, larger on the right than on the left  with abnormal opacity on current chest CT in these areas. No appreciable ventilation/perfusion mismatch. No segmental level ventilation or perfusion defect. These findings constitute a low probability of pulmonary embolism based on PIOPED II criteria. Electronically Signed   By: Lowella Grip III M.D.   On: 04/04/2018 12:44   Dg Chest Portable 1 View  Result Date: 04/03/2018 CLINICAL DATA:  Altered mental status EXAM: PORTABLE CHEST 1 VIEW COMPARISON:  03/30/2018, 06/30/2016 FINDINGS: Streaky bibasilar atelectasis. No focal consolidation or effusion. Normal cardiomediastinal silhouette with aortic atherosclerosis. No pneumothorax. Old distal right clavicle fracture. IMPRESSION: Low lung volumes with hazy bibasilar atelectasis. Electronically Signed   By: Donavan Foil M.D.   On: 04/03/2018 19:30     Medications:  Scheduled: . atorvastatin  20 mg Oral q1800  . clopidogrel  75 mg Oral Daily  . enoxaparin (LOVENOX) injection  40 mg Subcutaneous Q24H  . feeding supplement (ENSURE ENLIVE)  237 mL Oral BID BM  . gabapentin  300 mg Oral QHS  . mirtazapine  30 mg Oral QHS  . mometasone-formoterol  1 puff Inhalation BID  . OLANZapine  7.5 mg Oral BID  . oxybutynin  5 mg Oral TID  . traMADol  50 mg Oral TID  . umeclidinium bromide  1 puff Inhalation Daily   Continuous: . sodium chloride 50 mL/hr at 04/05/18 1050  . levofloxacin (LEVAQUIN) IV     KDX:[id number] **OR** acetaminophen, albuterol, alum & mag hydroxide-simeth, guaiFENesin, loperamide, magnesium hydroxide, ondansetron **OR** ondansetron (ZOFRAN) IV, traZODone    Assessment/Plan:   Acute respiratory failure with hypoxia Patient saturating well on oxygen.  Etiology for her acute respiratory failure is most likely some kind of viral bronchitis versus COPD exacerbation.  Continue to monitor.  Acute bronchitis in the setting of COPD/sepsis on presentation CT chest showed emphysema without any other acute abnormalities.  VQ scan  did not show any pulmonary embolism.  Patient was noted to have fever at the time of presentation with tachycardia altered mental status.  Considering a respiratory source it is likely she had sepsis on presentation.  Patient remains on levofloxacin.  Continue nebulizer treatments.  Not on systemic steroids currently.  Lactic acid level was normal.  WBC remains elevated.  HIV nonreactive.  Respiratory viral panel unremarkable.  Influenza negative.  Blood cultures negative so far.  Acute kidney injury Possibly due to hypovolemia.  Improved with IV hydration.  Cut back on IV fluids.  Monitor urine output.  History of essential hypertension Her home medications held due to borderline low blood pressure and concern for sepsis.  Blood pressure stable.  Continue to monitor.  Vascular dementia Mentation appears to be stable.  Noted to be on mirtazapine  and olanzapine which is being continued.  Normocytic anemia Mild drop in hemoglobin is dilutional.  No evidence of overt bleeding.  Continue to monitor.  Right Shoulder Injury Acute comminuted and mildly displaced lateral clavicle shaft fracture noted on xray from 1/5. PT. Sling.  Constipation Patient was found to have hard stool in her rectum yesterday.  Manually disimpacted.  Place her on laxatives.  DVT Prophylaxis: Lovenox    Code Status: DNR Family Communication: No family at bedside this morning Disposition Plan: Management as outlined above.  PT and OT evaluation.    LOS: 1 day   [facility] Pager [phone] 04/05/2018, 1:00 PM  If 7PM-7AM, please contact night-coverage at [email or web address], password [facility]

## 2018-04-06 DIAGNOSIS — K5900 Constipation, unspecified: Secondary | ICD-10-CM

## 2018-04-06 DIAGNOSIS — J9601 Acute respiratory failure with hypoxia: Secondary | ICD-10-CM

## 2018-04-06 LAB — IRON AND TIBC
Iron: 26 ug/dL — ABNORMAL LOW (ref 28–170)
Saturation Ratios: 18 % (ref 10.4–31.8)
TIBC: 148 ug/dL — AB (ref 250–450)
UIBC: 122 ug/dL

## 2018-04-06 LAB — CBC
HCT: 31 % — ABNORMAL LOW (ref 36.0–46.0)
Hemoglobin: 10.1 g/dL — ABNORMAL LOW (ref 12.0–15.0)
MCH: 30.6 pg (ref 26.0–34.0)
MCHC: 32.6 g/dL (ref 30.0–36.0)
MCV: 93.9 fL (ref 80.0–100.0)
NRBC: 0 % (ref 0.0–0.2)
Platelets: 301 10*3/uL (ref 150–400)
RBC: 3.3 MIL/uL — ABNORMAL LOW (ref 3.87–5.11)
RDW: 14.3 % (ref 11.5–15.5)
WBC: 14 10*3/uL — AB (ref 4.0–10.5)

## 2018-04-06 LAB — FOLATE: Folate: 6.6 ng/mL (ref >5.9)

## 2018-04-06 LAB — RETICULOCYTES
Immature Retic Fract: 6 % (ref 2.3–15.9)
RBC.: 3.3 MIL/uL — ABNORMAL LOW (ref 3.87–5.11)
Retic Count, Absolute: 37.6 10*3/uL (ref 19.0–186.0)
Retic Ct Pct: 1.1 % (ref 0.4–3.1)

## 2018-04-06 LAB — BASIC METABOLIC PANEL
Anion gap: 5 (ref 5–15)
BUN: 10 mg/dL (ref 8–23)
CALCIUM: 8.1 mg/dL — AB (ref 8.9–10.3)
CO2: 25 mmol/L (ref 22–32)
Chloride: 110 mmol/L (ref 98–111)
Creatinine, Ser: 0.74 mg/dL (ref 0.44–1.00)
GFR calc Af Amer: >60 mL/min (ref >60)
GFR calc non Af Amer: >60 mL/min (ref >60)
Glucose, Bld: 95 mg/dL (ref 70–99)
Potassium: 4.1 mmol/L (ref 3.5–5.1)
Sodium: 140 mmol/L (ref 135–145)

## 2018-04-06 LAB — TSH: TSH: 2.896 u[IU]/mL (ref 0.350–4.500)

## 2018-04-06 LAB — MRSA PCR SCREENING: MRSA by PCR: NEGATIVE

## 2018-04-06 LAB — FERRITIN: Ferritin: 267 ng/mL (ref 11–307)

## 2018-04-06 LAB — VITAMIN B12: Vitamin B-12: 407 pg/mL (ref 180–914)

## 2018-04-06 LAB — MAGNESIUM: Magnesium: 1.7 mg/dL (ref 1.7–2.4)

## 2018-04-06 MED ORDER — SODIUM CHLORIDE 0.9 % IV BOLUS
250.0000 mL | Freq: Once | INTRAVENOUS | Status: AC
  Administered 2018-04-06: 250 mL via INTRAVENOUS

## 2018-04-06 MED ORDER — LEVOFLOXACIN 250 MG PO TABS
250.0000 mg | ORAL_TABLET | Freq: Every day | ORAL | Status: DC
  Administered 2018-04-07: 250 mg via ORAL
  Filled 2018-04-06 (×2): qty 1

## 2018-04-06 NOTE — Progress Notes (Signed)
[facility] PROGRESS NOTE  [name] LYY:[id number] DOB: 1945-11-15 DOA: 04/03/2018  PCP: Patient, No Pcp Per  Brief History/Interval Summary: 73 year old Caucasian female with a past medical history of vascular dementia, prior stroke, hypertension, history of COPD.  She is from a memory care unit at [facility].  She has had multiple falls this week with visits to the emergency department.  She presented this time around with shortness of breath.  She was hospitalized due to new oxygen requirements.  Reason for Visit: Acute respiratory failure with hypoxia.  Consultants: None  Procedures: None  Antibiotics: Levofloxacin  Subjective/Interval History: Patient remains pleasantly confused.  Appears to be more comfortable this morning compared to yesterday.  Per nursing staff she is had 2 bowel movements.    ROS: Unable to do due to her dementia  Objective:  Vital Signs  Vitals:   04/05/18 2117 04/05/18 2140 04/06/18 0609 04/06/18 0825  BP:  139/78 (!) 120/56   Pulse:  (!) 107 79   Resp:  18 16   Temp:  99.6 F (37.6 C) 98.1 F (36.7 C)   TempSrc:   Oral   SpO2: 95% 97% 97% 98%  Weight:      Height:        Intake/Output Summary (Last 24 hours) at 04/06/2018 0955 Last data filed at 04/06/2018 0600 Gross per 24 hour  Intake 1538.2 ml  Output -  Net 1538.2 ml   Filed Weights   04/04/18 1417  Weight: 50.4 kg   General appearance: Patient is pleasantly confused.  In no distress. Resp: Normal effort noted today.  Coarse breath sounds.  No wheezing.  No rhonchi. Cardio: S1-S2 is normal regular.  No S3-S4.  No rubs murmurs or bruit.  Telemetry shows sinus rhythm. GI: Abdomen remains soft.  Nondistended in the left abdomen is yesterday.  Bowel sounds present normal.  No masses organomegaly.   Extremities: No edema.  Full range of motion of lower extremities. Neurologic:  No focal neurological deficits.    Lab Results:  Data Reviewed: I have personally  reviewed following labs and imaging studies  CBC: Recent Labs  Lab 03/30/18 1329 04/03/18 2034 04/04/18 0511 04/05/18 0531 04/06/18 0808  WBC 11.8* 19.8* 19.5* 18.9* 14.0*  NEUTROABS 7.7  --   --   --   --   HGB 13.1 12.7 11.3* 10.9* 10.1*  HCT 40.1 39.9 35.8* 33.6* 31.0*  MCV 89.7 93.7 94.0 94.4 93.9  PLT 383 392 338 299 127    Basic Metabolic Panel: Recent Labs  Lab 03/30/18 1329 04/03/18 2034 04/04/18 0511 04/05/18 0531 04/06/18 0808  NA 133* 133* 137 138 140  K 4.2 5.2* 4.6 3.5 4.1  CL 103 101 110 110 110  CO2 21* 21* 16* 21* 25  GLUCOSE 117* 111* 100* 95 95  BUN 15 39* 30* 12 10  CREATININE 0.83 1.97* 1.24* 0.64 0.74  CALCIUM 9.4 9.3 8.0* 7.7* 8.1*  MG  --   --   --   --  1.7    GFR: Estimated Creatinine Clearance: 49.1 mL/min (by C-G formula based on SCr of 0.74 mg/dL).  Liver Function Tests: Recent Labs  Lab 03/30/18 1329 04/03/18 2034 04/05/18 0531  AST 21 46* 44*  ALT 18 21 22   ALKPHOS 78 77 62  BILITOT 0.6 0.8 0.8  PROT 6.6 7.1 5.6*  ALBUMIN 3.5 3.9 2.9*    Recent Labs  Lab 04/03/18 2034  LIPASE 28   Cardiac Enzymes: Recent  Labs  Lab 04/03/18 2034  TROPONINI <0.03     Recent Results (from the past 240 hour(s))  Blood culture (routine x 2)     Status: None (Preliminary result)   Collection Time: 04/03/18  9:43 PM  Result Value Ref Range Status   Specimen Description   Final    BLOOD RIGHT WRIST Performed at [facility], [place] [address] [place], [place] [address]    Special Requests   Final    BOTTLES DRAWN AEROBIC AND ANAEROBIC Blood Culture adequate volume Performed at [place] [address], [place], [place] [address]    Culture   Final    NO GROWTH 2 DAYS Performed at [place] [address] [place], [place] [address]    Report Status PENDING  Incomplete  Respiratory Panel by PCR     Status: None   Collection Time: 04/04/18 12:17 AM  Result Value Ref Range Status   Adenovirus  NOT DETECTED NOT DETECTED Final   Coronavirus 229E NOT DETECTED NOT DETECTED Final   Coronavirus HKU1 NOT DETECTED NOT DETECTED Final   Coronavirus NL63 NOT DETECTED NOT DETECTED Final   Coronavirus OC43 NOT DETECTED NOT DETECTED Final   Metapneumovirus NOT DETECTED NOT DETECTED Final   Rhinovirus / Enterovirus NOT DETECTED NOT DETECTED Final   Influenza A NOT DETECTED NOT DETECTED Final   Influenza B NOT DETECTED NOT DETECTED Final   Parainfluenza Virus 1 NOT DETECTED NOT DETECTED Final   Parainfluenza Virus 2 NOT DETECTED NOT DETECTED Final   Parainfluenza Virus 3 NOT DETECTED NOT DETECTED Final   Parainfluenza Virus 4 NOT DETECTED NOT DETECTED Final   Respiratory Syncytial Virus NOT DETECTED NOT DETECTED Final   Bordetella pertussis NOT DETECTED NOT DETECTED Final   Chlamydophila pneumoniae NOT DETECTED NOT DETECTED Final   Mycoplasma pneumoniae NOT DETECTED NOT DETECTED Final    Comment: Performed at [facility], [place] [address] [place], [place] [address]      Radiology Studies: Ct Chest Wo Contrast  Result Date: 04/04/2018 CLINICAL DATA:  Shortness of breath and fevers EXAM: CT CHEST WITHOUT CONTRAST TECHNIQUE: Multidetector CT imaging of the chest was performed following the standard protocol without IV contrast. COMPARISON:  CT of the cervical spine from 04/26/2016, recent chest x-ray from the previous day FINDINGS: Cardiovascular: Somewhat limited due to lack of IV contrast. Diffuse atherosclerotic calcifications are seen without aneurysmal dilatation. No cardiac enlargement is noted. No pericardial effusion is seen. Coronary calcifications are noted. Mediastinum/Nodes: Thoracic inlet is within normal limits. No hilar or mediastinal adenopathy is noted. The esophagus is within normal limits with the exception of a sliding-type hiatal hernia. Lungs/Pleura: Diffuse emphysematous changes are noted. Scarring is noted in the apices bilaterally stable from a prior CT of the cervical  spine. No focal infiltrate or sizable effusion is noted. No sizable parenchymal nodules are seen. No pneumothorax is noted. Upper Abdomen: No acute abnormality in the upper abdomen is seen. Musculoskeletal: Degenerative changes of the thoracic spine are noted. T6 compression deformity is noted stable from a prior exam from 2016. Changes of prior augmentation at L1 are seen. IMPRESSION: Chronic scarring in the apices bilaterally. Underlying emphysematous changes noted without acute abnormality. Small sliding-type hiatal hernia. Chronic changes in the thoracic spine. Aortic Atherosclerosis (ICD10-I70.0) and Emphysema (ICD10-J43.9). Electronically Signed   By: Inez Catalina M.D.   On: 04/04/2018 13:05   Nm Pulmonary Perf And Vent  Result Date: 04/04/2018 CLINICAL DATA:  Shortness  of breath and fever.  Tachycardia. EXAM: NUCLEAR MEDICINE VENTILATION - PERFUSION LUNG SCAN VIEWS: Anterior, posterior, left lateral, right lateral, RPO, LPO, RAO, LAO-ventilation and perfusion RADIOPHARMACEUTICALS:  31.5 mCi of Tc-87m DTPA aerosol inhalation and 4.4 mCi Tc78m MAA IV COMPARISON:  Chest radiograph April 03, 2024; chest CT April 04, 2018 FINDINGS: Ventilation: There is decreased radiotracer uptake in the lung apex regions bilaterally in a nonsegmental distribution. Elsewhere, ventilation uptake is symmetric bilaterally. No segmental level ventilation defects are evident. Perfusion: There Is mild decreased uptake in the apices in a distribution which matches a ventilation defects in the apices. No segmental level perfusion defect is evident. Note that on CT, there is opacity in each lung apex, more severe on the right than on the left which corresponds to the areas of decreased radiotracer uptake on the ventilation and perfusion studies. IMPRESSION: Matching apical defects, larger on the right than on the left with abnormal opacity on current chest CT in these areas. No appreciable ventilation/perfusion mismatch. No  segmental level ventilation or perfusion defect. These findings constitute a low probability of pulmonary embolism based on PIOPED II criteria. Electronically Signed   By: Lowella Grip III M.D.   On: 04/04/2018 12:44     Medications:  Scheduled: . atorvastatin  20 mg Oral q1800  . clopidogrel  75 mg Oral Daily  . enoxaparin (LOVENOX) injection  40 mg Subcutaneous Q24H  . feeding supplement (ENSURE ENLIVE)  237 mL Oral BID BM  . gabapentin  300 mg Oral QHS  . mirtazapine  30 mg Oral QHS  . mometasone-formoterol  1 puff Inhalation BID  . OLANZapine  7.5 mg Oral BID  . oxybutynin  5 mg Oral TID  . polyethylene glycol  17 g Oral Daily  . traMADol  50 mg Oral TID  . umeclidinium bromide  1 puff Inhalation Daily   Continuous: . levofloxacin (LEVAQUIN) IV 250 mg (04/05/18 1414)   IRJ:JOACZYSAYTKZS **OR** acetaminophen, albuterol, alum & mag hydroxide-simeth, guaiFENesin, magnesium hydroxide, ondansetron **OR** ondansetron (ZOFRAN) IV, traZODone    Assessment/Plan:   Acute respiratory failure with hypoxia Patient admitted with hypoxia.  Saturating well on oxygen.  Etiology for her acute respiratory failure is most likely some kind of viral bronchitis versus COPD exacerbation.  Respiratory status appears to have improved.  Continue to monitor.  Acute bronchitis in the setting of COPD/sepsis on presentation CT chest showed emphysema without any other acute abnormalities.  VQ scan did not show any pulmonary embolism.  Patient was noted to have fever at the time of presentation with tachycardia altered mental status.  Considering a respiratory source it is likely she had sepsis on presentation.  Patient remains on levofloxacin.  Seems to be improving.  Continue antibiotics.  WBC has improved.  Not on systemic steroids.  Lactic acid level was normal.  Respiratory viral panel and influenza were both negative. Blood cultures negative so far.  Acute kidney injury Possibly due to hypovolemia.   Improved with IV hydration.  Okay to stop IV fluids.  History of essential hypertension Her home medications held due to borderline low blood pressure and concern for sepsis.  Blood pressure stable.  Continue to monitor.  Vascular dementia Mentation appears to be stable.  Noted to be on mirtazapine and olanzapine which is being continued.  Normocytic anemia Mild drop in hemoglobin is dilutional.  No evidence of overt bleeding.  Hemoglobin remains stable.  Anemia panel reviewed.  B12 407.  Folate 6.6.  Ferritin 267.  Right Shoulder  Injury Acute comminuted and mildly displaced lateral clavicle shaft fracture noted on xray from 1/5.  Shoulder sling.  Constipation Patient was noted to have fecal impaction which was manually removed.  She was given MiraLAX.  She did have 2 bowel movements yesterday.  Continue to monitor.    DVT Prophylaxis: Lovenox    Code Status: DNR Family Communication: Discussed with her daughter over the phone Disposition Plan: Management as outlined above.  PT and OT evaluation.  Hopefully return to her memory care unit when medically improved.  Possibly in 24 to 48 hours.    LOS: 2 days   [facility] Pager [phone] 04/06/2018, 9:55 AM  If 7PM-7AM, please contact night-coverage at [email or web address], password [facility]

## 2018-04-06 NOTE — Progress Notes (Signed)
Pt had episode of hypotension. Was lying in bed expressing no complaints. Md notified and orders obtained.Eulas Post, RN

## 2018-04-06 NOTE — Progress Notes (Signed)
PHARMACIST - PHYSICIAN COMMUNICATION DR:   [place] CONCERNING: Antibiotic IV to Oral Route Change Policy  RECOMMENDATION: This patient is receiving Levaquin by the intravenous route.  Based on criteria approved by the Pharmacy and Therapeutics Committee, the antibiotic(s) is/are being converted to the equivalent oral dose form(s).   DESCRIPTION: These criteria include:  Patient being treated for a respiratory tract infection, urinary tract infection, cellulitis or clostridium difficile associated diarrhea if on metronidazole  The patient is not neutropenic and does not exhibit a GI malabsorption state  The patient is eating (either orally or via tube) and/or has been taking other orally administered medications for a least 24 hours  The patient is improving clinically and has a Tmax < 100.5  If you have questions about this conversion, please contact the Pharmacy Department  []   [phone] )  [facility] []   [phone] )  [facility] []   [phone] )  [facility] []   [phone] )  [facility] [x]   [phone] )  [facility]

## 2018-04-07 DIAGNOSIS — D649 Anemia, unspecified: Secondary | ICD-10-CM

## 2018-04-07 LAB — BASIC METABOLIC PANEL
Anion gap: 7 (ref 5–15)
BUN: 10 mg/dL (ref 8–23)
CO2: 24 mmol/L (ref 22–32)
Calcium: 8.1 mg/dL — ABNORMAL LOW (ref 8.9–10.3)
Chloride: 109 mmol/L (ref 98–111)
Creatinine, Ser: 0.66 mg/dL (ref 0.44–1.00)
GFR calc Af Amer: >60 mL/min (ref >60)
GFR calc non Af Amer: >60 mL/min (ref >60)
Glucose, Bld: 88 mg/dL (ref 70–99)
Potassium: 3.7 mmol/L (ref 3.5–5.1)
Sodium: 140 mmol/L (ref 135–145)

## 2018-04-07 LAB — CBC
HCT: 30.7 % — ABNORMAL LOW (ref 36.0–46.0)
Hemoglobin: 9.7 g/dL — ABNORMAL LOW (ref 12.0–15.0)
MCH: 29.8 pg (ref 26.0–34.0)
MCHC: 31.6 g/dL (ref 30.0–36.0)
MCV: 94.5 fL (ref 80.0–100.0)
Platelets: 304 10*3/uL (ref 150–400)
RBC: 3.25 MIL/uL — AB (ref 3.87–5.11)
RDW: 14.3 % (ref 11.5–15.5)
WBC: 11.6 10*3/uL — ABNORMAL HIGH (ref 4.0–10.5)
nRBC: 0 % (ref 0.0–0.2)

## 2018-04-07 MED ORDER — LEVOFLOXACIN 250 MG PO TABS: 250.0000 mg | ORAL_TABLET | Freq: Every day | ORAL | 0 refills | Status: AC

## 2018-04-07 MED ORDER — TRAMADOL HCL 50 MG PO TABS: 50.0000 mg | ORAL_TABLET | Freq: Two times a day (BID) | ORAL | 0 refills | Status: DC | PRN

## 2018-04-07 MED ORDER — MOMETASONE FURO-FORMOTEROL FUM 200-5 MCG/ACT IN AERO: 1.0000 | INHALATION_SPRAY | Freq: Two times a day (BID) | RESPIRATORY_TRACT | 0 refills | Status: DC

## 2018-04-07 MED ORDER — POLYETHYLENE GLYCOL 3350 17 G PO PACK: 17.0000 g | PACK | Freq: Every day | ORAL | 0 refills | Status: AC

## 2018-04-07 NOTE — NC FL2 (Signed)
[place] LEVEL OF CARE SCREENING TOOL     IDENTIFICATION  Patient Name: [name] Birthdate: 09/28/1945 Sex: female Admission Date (Current Location): 04/03/2018  [facility] and [place] Number:  Herbalist and Address:  [facility],  [place] [address] [place]      Provider Number: [id number]  Attending Physician Name and Address:  Bonnielee Haff, MD  Relative Name and Phone Number:       Current Level of Care: Hospital Recommended Level of Care: Memory Care Prior Approval Number:    Date Approved/Denied:   PASRR Number:    Discharge Plan: Other (Comment)(memory care secured unit)    Current Diagnoses: Patient Active Problem List   Diagnosis Date Noted  . AKI (acute kidney injury) ([place]) 04/04/2018  . Hypoxia 04/04/2018  . Probable sepsis 04/04/2018  . Vascular dementia without behavioral disturbance ([place]) 06/30/2016  . Disease ruled out after examination   . Aphasia 05/15/2016  . Atherosclerosis of left carotid artery   . Cerebral embolism with cerebral infarction 02/27/2016  . Acute encephalopathy 02/26/2016  . Osteoporosis 07/04/2015  . Impaired cognition 03/30/2014  . Vitamin D deficiency 08/18/2013  . Hyperlipidemia 08/18/2013  . Prediabetes 08/18/2013  . Medication management 08/18/2013  . CKD (chronic kidney disease) stage 3, GFR 30-59 ml/min ([name]) 07/01/2013  . Anemia of chronic disease 07/01/2013  . Depression, major, in remission ([place]) 06/30/2013  . Essential hypertension 02/09/2013  . COPD GOLD III 03/28/2012  . Vocal cord dysfunction 03/28/2012  . Carotid artery aneurysm ([place]) 03/04/2012  . Smoker 03/04/2012    Orientation RESPIRATION BLADDER Height & Weight     Self  Normal Incontinent Weight: 111 lb 1.8 oz (50.4 kg) Height:  5\' 3"  (160 cm)  BEHAVIORAL SYMPTOMS/MOOD NEUROLOGICAL BOWEL NUTRITION STATUS      Incontinent Diet(pureed)  AMBULATORY STATUS COMMUNICATION OF NEEDS Skin   Extensive  Assist Verbally Normal                       Personal Care Assistance Level of Assistance  Bathing, Feeding, Dressing Bathing Assistance: Maximum assistance Feeding assistance: Limited assistance Dressing Assistance: Maximum assistance     Functional Limitations Info  Sight, Hearing, Speech Sight Info: Adequate Hearing Info: Adequate Speech Info: Adequate    SPECIAL CARE FACTORS FREQUENCY                       Contractures Contractures Info: Not present    Additional Factors Info                  Current Medications (04/07/2018):  This is the current hospital active medication list Current Facility-Administered Medications  Medication Dose Route Frequency Provider Last Rate Last Dose  . acetaminophen (TYLENOL) tablet 650 mg  650 mg Oral Q6H PRN Etta Quill, DO   650 mg at 04/05/18 0258   Or  . acetaminophen (TYLENOL) suppository 650 mg  650 mg Rectal Q6H PRN Etta Quill, DO      . albuterol (PROVENTIL) (2.5 MG/3ML) 0.083% nebulizer solution 2.5 mg  2.5 mg Nebulization Q2H PRN Etta Quill, DO      . alum & mag hydroxide-simeth (MAALOX/MYLANTA) 200-200-20 MG/5ML suspension 30 mL  30 mL Oral Q6H PRN Etta Quill, DO      . atorvastatin (LIPITOR) tablet 20 mg  20 mg Oral q1800 Jennette Kettle M, DO   20 mg at 04/06/18 1640  .  clopidogrel (PLAVIX) tablet 75 mg  75 mg Oral Daily Jennette Kettle M, DO   75 mg at 04/07/18 1030  . enoxaparin (LOVENOX) injection 40 mg  40 mg Subcutaneous Q24H Jennette Kettle M, DO   40 mg at 04/07/18 1036  . feeding supplement (ENSURE ENLIVE) (ENSURE ENLIVE) liquid 237 mL  237 mL Oral BID BM Reyne Dumas, MD   237 mL at 04/06/18 1408  . gabapentin (NEURONTIN) capsule 300 mg  300 mg Oral QHS Jennette Kettle M, DO   300 mg at 04/06/18 2320  . guaiFENesin (ROBITUSSIN) 100 MG/5ML solution 200 mg  10 mL Oral Q6H PRN Etta Quill, DO      . levofloxacin [facility]) tablet 250 mg  250 mg Oral Daily Bonnielee Haff, MD      .  magnesium hydroxide (MILK OF MAGNESIA) suspension 30 mL  30 mL Oral QHS PRN Etta Quill, DO      . mirtazapine (REMERON) tablet 30 mg  30 mg Oral QHS Jennette Kettle M, DO   30 mg at 04/06/18 2319  . mometasone-formoterol (DULERA) 200-5 MCG/ACT inhaler 1 puff  1 puff Inhalation BID Jennette Kettle M, DO   1 puff at 04/07/18 1016  . OLANZapine (ZYPREXA) tablet 7.5 mg  7.5 mg Oral BID Jennette Kettle M, DO   7.5 mg at 04/07/18 1030  . ondansetron (ZOFRAN) tablet 4 mg  4 mg Oral Q6H PRN Etta Quill, DO       Or  . ondansetron [facility]) injection 4 mg  4 mg Intravenous Q6H PRN Etta Quill, DO      . oxybutynin (DITROPAN) tablet 5 mg  5 mg Oral TID Etta Quill, DO   5 mg at 04/07/18 1030  . polyethylene glycol (MIRALAX / GLYCOLAX) packet 17 g  17 g Oral Daily Bonnielee Haff, MD   17 g at 04/07/18 1029  . traMADol (ULTRAM) tablet 50 mg  50 mg Oral TID Etta Quill, DO   50 mg at 04/07/18 1030  . traZODone (DESYREL) tablet 50 mg  50 mg Oral QHS PRN Etta Quill, DO   50 mg at 04/05/18 0006  . umeclidinium bromide (INCRUSE ELLIPTA) 62.5 MCG/INH 1 puff  1 puff Inhalation Daily Etta Quill, DO   1 puff at 04/04/18 1009     Discharge Medications: STOP taking these medications   amLODipine 5 MG tablet Commonly known as:  NORVASC   loperamide 2 MG capsule Commonly known as:  IMODIUM     TAKE these medications   acetaminophen 500 MG tablet Commonly known as:  TYLENOL Take 500 mg by mouth every 4 (four) hours as needed for mild pain.   atorvastatin 20 MG tablet Commonly known as:  LIPITOR Take 1 tablet (20 mg total) by mouth daily at 6 PM.   b complex vitamins tablet Take 1 tablet by mouth daily.   clopidogrel 75 MG tablet Commonly known as:  PLAVIX Take 75 mg by mouth daily.   gabapentin 300 MG capsule Commonly known as:  NEURONTIN Take 300 mg by mouth at bedtime.   guaiFENesin 100 MG/5ML Soln Commonly known as:  ROBITUSSIN Take 10 mLs by mouth  every 6 (six) hours as needed for cough or to loosen phlegm.   levofloxacin 250 MG tablet Commonly known as:  LEVAQUIN Take 1 tablet (250 mg total) by mouth daily for 2 days.   losartan 100 MG tablet Commonly known as:  COZAAR Take 100 mg by mouth  daily.   magnesium hydroxide 400 MG/5ML suspension Commonly known as:  MILK OF MAGNESIA Take 30 mLs by mouth at bedtime as needed for mild constipation.   [place] 200-200-20 MG/5ML suspension Generic drug:  alum & mag hydroxide-simeth Take 30 mLs by mouth every 6 (six) hours as needed for indigestion or heartburn.   mirtazapine 30 MG tablet Commonly known as:  REMERON Take 30 mg by mouth at bedtime.   mometasone-formoterol 200-5 MCG/ACT Aero Commonly known as:  DULERA Inhale 1 puff into the lungs 2 (two) times daily.   OLANZapine 15 MG tablet Commonly known as:  ZYPREXA Take 7.5 mg by mouth 2 (two) times daily.   oxybutynin 5 MG tablet Commonly known as:  DITROPAN TAKE 1/2 TO 1 TABLET BY MOUTH THREE TIMES DAILY FOR BLADDER CONTROL What changed:  See the new instructions.   polyethylene glycol packet Commonly known as:  MIRALAX / GLYCOLAX Take 17 g by mouth daily. Start taking on:  April 08, 2018   traMADol 50 MG tablet Commonly known as:  ULTRAM Take 1 tablet (50 mg total) by mouth 2 (two) times daily as needed. What changed:    when to take this  reasons to take this   traZODone 50 MG tablet Commonly known as:  DESYREL Take 50 mg by mouth at bedtime as needed for sleep.   trolamine salicylate 10 % cream Commonly known as:  ASPERCREME Apply 1 application topically 2 (two) times daily as needed for muscle pain.   vitamin C 500 MG tablet Commonly known as:  ASCORBIC ACID Take 500 mg by mouth daily.    Relevant Imaging Results:  Relevant Lab Results:   Additional Information SSN: [id number]  Nila Nephew, LCSW

## 2018-04-07 NOTE — Progress Notes (Addendum)
[facility] staff RN Vickii Chafe has reviewed DC information and advises facility prepared for pt's return. Report [phone] Arranged transportation. Daughter aware.  Sharren Bridge, MSW, LCSW Clinical Social Work 04/07/2018 [phone]

## 2018-04-07 NOTE — Care Management Important Message (Signed)
Important Message  Patient Details  Name: [name] MRN: [id number] Date of Birth: Oct 05, 1945   Medicare Important Message Given:  Yes    Kerin Salen 04/07/2018, 11:57 AMImportant Message  Patient Details  Name: [name] MRN: [id number] Date of Birth: March 21, 1946   Medicare Important Message Given:  Yes    Kerin Salen 04/07/2018, 11:57 AM

## 2018-04-07 NOTE — Consult Note (Signed)
   [facility] CM Inpatient Consult   04/07/2018  ELERI RUBEN 27-Jul-1945 [id number]     Patient screened for potential [facility] Care Management services due to unplanned readmission risk score of 26% (high).  Chart reviewed. Noted patient is from memory care at ALF with hospice services.   No identifiable [facility] Care Management needs.   Marthenia Rolling, MSN-Ed, RN,BSN [facility] Liaison [phone]

## 2018-04-07 NOTE — Progress Notes (Signed)
Pt is resident of memory care unit at [facility] left voicemail with facility to coordinate return at DC. Spoke with pt's daughter- she reports that they have had pt start receiving hospice care at facility as well as of 04/04/17 ( [facility]). Daughter has notified hospice of pt's pending DC today.  Sharren Bridge, MSW, LCSW Clinical Social Work 04/07/2018 [phone]

## 2018-04-07 NOTE — Progress Notes (Signed)
Writer assumed care of this patient at 1545.  I agree with the previous nurses assessment. Will continue to monitor closely and follow plan of care.  

## 2018-04-07 NOTE — Progress Notes (Signed)
Attemped to call report to [facility].  Name and number left with secretary to call back when available.    When patient is picked up via [facility] RN is to call [place] with [facility] [phone]).

## 2018-04-07 NOTE — Care Management Note (Signed)
Case Management Note  Patient Details  Name: [name] MRN: [id number] Date of Birth: 02-27-1946  Subjective/Objective: patient returning back to ALF-[facility] memory care-Facility contracts with [facility] for home hospice-[facility] spoke to [place] & also received callback from[facility]-faxed d/c summary with confirmation. No further CM needs.                   Action/Plan:dc ALF w/home hospice-[facility]   Expected Discharge Date:  04/07/18               Expected Discharge Plan:  Assisted Living / Rest Home  In-House Referral:  Clinical Social Work  Discharge planning Services  CM Consult  Post Acute Care Choice:    Choice offered to:     DME Arranged:    DME Agency:     HH Arranged:    [place] Agency:     Status of Service:  Completed, signed off  If discussed at [employer], dates discussed:    Additional Comments:  Dessa Phi, RN 04/07/2018, 3:09 PM

## 2018-04-07 NOTE — Discharge Summary (Signed)
[facility]  Physician Discharge Summary   Patient ID: AKASIA AHMAD MRN: [id number] DOB/AGE: [birth year] 73 y.o.  Admit date: 04/03/2018 Discharge date: 04/07/2018  PCP: Patient, No Pcp Per  DISCHARGE DIAGNOSES:  Acute respiratory failure with hypoxia, resolved Acute bronchitis in the setting of COPD, improved Acute kidney injury, resolved Essential hypertension Vascular dementia Normocytic anemia Right lateral clavicle fracture Constipation, resolved   RECOMMENDATIONS FOR OUTPATIENT FOLLOW UP: 1. Continue laxatives for now for constipation.  Patient had fecal impaction.   DISCHARGE CONDITION: fair  Diet recommendation: Regular as tolerated  Filed Weights   04/04/18 1417  Weight: 50.4 kg    INITIAL HISTORY: 73 year old Caucasian female with a past medical history of vascular dementia, prior stroke, hypertension, history of COPD.  She is from a memory care unit at [facility].  She has had multiple falls this week with visits to the emergency department.  She presented this time around with shortness of breath.  She was hospitalized due to new oxygen requirements.   HOSPITAL COURSE:    Acute respiratory failure with hypoxia Patient admitted with hypoxia.  Saturating well on oxygen.  Etiology for her acute respiratory failure is most likely some kind of viral bronchitis versus COPD exacerbation.  Respiratory status appears to have improved.    She is saturating normal on room air.  Acute bronchitis in the setting of COPD/sepsis on presentation CT chest showed emphysema without any other acute abnormalities.  VQ scan did not show any pulmonary embolism.  Patient was noted to have fever at the time of presentation with tachycardia altered mental status.  Sepsis was present on admission.  Now resolved.  Improved with Levaquin.  Continue for 2 more days.  Did not require systemic steroids. Lactic acid level was normal.  Respiratory viral panel and influenza were  both negative. Blood cultures negative so far.  Acute kidney injury Possibly due to hypovolemia.  Improved with IV hydration.  History of essential hypertension Her home medications held due to borderline low blood pressure and concern for sepsis.    Pressures have stabilized.  Okay to resume some of her antihypertensives as mentioned below.  Vascular dementia Mentation appears to be stable.  Noted to be on mirtazapine and olanzapine which is being continued.  Normocytic anemia Mild drop in hemoglobin is dilutional.  No evidence of overt bleeding.  Hemoglobin remains stable.  Anemia panel reviewed.  B12 407.  Folate 6.6.  Ferritin 267.  Right Shoulder Injury Acute comminuted and mildly displaced lateral clavicle shaft fracture noted on xray from 1/5.  Shoulder sling.  Does not appear to be in much pain.  Constipation Patient was noted to have fecal impaction which was manually removed.  She was given MiraLAX with her multiple bowel movements.  Continue laxatives.  Discontinue the Imodium for now.  Apparently patient was recently placed under hospice services at her assisted living facility/memory care unit.  She may return to the facility today.  She appears to be medically stable.  Okay for discharge.   PERTINENT LABS:  The results of significant diagnostics from this hospitalization (including imaging, microbiology, ancillary and laboratory) are listed below for reference.    Microbiology: Recent Results (from the past 240 hour(s))  Blood culture (routine x 2)     Status: None (Preliminary result)   Collection Time: 04/03/18  9:43 PM  Result Value Ref Range Status   Specimen Description   Final    BLOOD RIGHT WRIST Performed at [facility], [address] [place], [place] [address]    Special Requests   Final    BOTTLES DRAWN AEROBIC AND ANAEROBIC Blood Culture adequate volume Performed at [facility] [address] [place], [place]  [address]    Culture   Final    NO GROWTH 3 DAYS Performed at [facility], [place] [address], [place], [place] [address]    Report Status PENDING  Incomplete  Respiratory Panel by PCR     Status: None   Collection Time: 04/04/18 12:17 AM  Result Value Ref Range Status   Adenovirus NOT DETECTED NOT DETECTED Final   Coronavirus 229E NOT DETECTED NOT DETECTED Final   Coronavirus HKU1 NOT DETECTED NOT DETECTED Final   Coronavirus NL63 NOT DETECTED NOT DETECTED Final   Coronavirus OC43 NOT DETECTED NOT DETECTED Final   Metapneumovirus NOT DETECTED NOT DETECTED Final   Rhinovirus / Enterovirus NOT DETECTED NOT DETECTED Final   Influenza A NOT DETECTED NOT DETECTED Final   Influenza B NOT DETECTED NOT DETECTED Final   Parainfluenza Virus 1 NOT DETECTED NOT DETECTED Final   Parainfluenza Virus 2 NOT DETECTED NOT DETECTED Final   Parainfluenza Virus 3 NOT DETECTED NOT DETECTED Final   Parainfluenza Virus 4 NOT DETECTED NOT DETECTED Final   Respiratory Syncytial Virus NOT DETECTED NOT DETECTED Final   Bordetella pertussis NOT DETECTED NOT DETECTED Final   Chlamydophila pneumoniae NOT DETECTED NOT DETECTED Final   Mycoplasma pneumoniae NOT DETECTED NOT DETECTED Final    Comment: Performed at [facility], [place] [address], [place], [place] [address]  MRSA PCR Screening     Status: None   Collection Time: 04/06/18  8:01 PM  Result Value Ref Range Status   MRSA by PCR NEGATIVE NEGATIVE Final    Comment:        The GeneXpert MRSA Assay (FDA approved for NASAL specimens only), is one component of a comprehensive MRSA colonization surveillance program. It is not intended to diagnose MRSA infection nor to guide or monitor treatment for MRSA infections. Performed at [facility], [place] [address] [place], [place] [address]      Labs: Basic Metabolic Panel: Recent Labs  Lab 04/03/18 2034 04/04/18 0511 04/05/18 0531 04/06/18 0808 04/07/18 0519  NA 133* 137 138  140 140  K 5.2* 4.6 3.5 4.1 3.7  CL 101 110 110 110 109  CO2 21* 16* 21* 25 24  GLUCOSE 111* 100* 95 95 88  BUN 39* 30* 12 10 10   CREATININE 1.97* 1.24* 0.64 0.74 0.66  CALCIUM 9.3 8.0* 7.7* 8.1* 8.1*  MG  --   --   --  1.7  --    Liver Function Tests: Recent Labs  Lab 04/03/18 2034 04/05/18 0531  AST 46* 44*  ALT 21 22  ALKPHOS 77 62  BILITOT 0.8 0.8  PROT 7.1 5.6*  ALBUMIN 3.9 2.9*   Recent Labs  Lab 04/03/18 2034  LIPASE 28   CBC: Recent Labs  Lab 04/03/18 2034 04/04/18 0511 04/05/18 0531 04/06/18 0808 04/07/18 0519  WBC 19.8* 19.5* 18.9* 14.0* 11.6*  HGB 12.7 11.3* 10.9* 10.1* 9.7*  HCT 39.9 35.8* 33.6* 31.0* 30.7*  MCV 93.7 94.0 94.4 93.9 94.5  PLT 392 338 299 301 304   Cardiac Enzymes: Recent Labs  Lab 04/03/18 2034  TROPONINI <0.03     IMAGING STUDIES Dg Chest 2 View  Result Date: 03/30/2018 CLINICAL DATA:  73 y/o F; fall 4 days ago. Pain to  the anterior right shoulder. EXAM: CHEST - 2 VIEW COMPARISON:  06/30/2016 chest radiograph. FINDINGS: Normal cardiac silhouette. Aortic atherosclerosis with calcification. Stable hyperinflation and biapical pleuroparenchymal scarring. No focal consolidation. No pleural effusion or pneumothorax. Stable midthoracic and upper lumbar chronic compression deformities post lumbar augmentation. Acute comminuted mildly displaced lateral clavicle shaft fracture. IMPRESSION: 1. No acute pulmonary process identified. 2. Acute comminuted mildly displaced lateral clavicle shaft fracture. Electronically Signed   By: Kristine Garbe M.D.   On: 03/30/2018 14:19   Dg Shoulder Right  Result Date: 03/30/2018 CLINICAL DATA:  73 y/o  F; fall with right shoulder pain. EXAM: RIGHT SHOULDER - 2+ VIEW COMPARISON:  None. FINDINGS: Acute comminuted and mildly displaced lateral clavicle shaft fracture. Normal acromioclavicular and coracoclavicular intervals. No additional fracture or dislocation identified. IMPRESSION: Acute comminuted  and mildly displaced lateral clavicle shaft fracture. Electronically Signed   By: Kristine Garbe M.D.   On: 03/30/2018 14:20   Dg Wrist Complete Left  Result Date: 04/02/2018 CLINICAL DATA:  Unwitnessed fall this morning with left wrist deformity. EXAM: LEFT WRIST - COMPLETE 3+ VIEW COMPARISON:  Radiographs 04/24/2016 FINDINGS: Remote distal radius fracture has healed. Mild residual posttraumatic deformity. Disruption of the distal radioulnar joint with ulna positive variance appears chronic and similar to prior. No acute fracture. The bones are under mineralized. IMPRESSION: 1. No acute fracture of the left wrist. 2. Remote distal radius fracture with healed posttraumatic deformity. Ulna positive variance was seen on prior exam. Electronically Signed   By: Keith Rake M.D.   On: 04/02/2018 01:45   Ct Head Wo Contrast  Result Date: 04/02/2018 CLINICAL DATA:  Post unwitnessed fall with laceration to posterior head. Head trauma, intracranial venous injury suspected EXAM: CT HEAD WITHOUT CONTRAST TECHNIQUE: Contiguous axial images were obtained from the base of the skull through the vertex without intravenous contrast. COMPARISON:  Head CT 06/29/2016 FINDINGS: Brain: No intracranial hemorrhage, mass effect, or midline shift. Stable degree of atrophy and chronic small vessel ischemia. Remote left temporoparietal infarcts with associated ex vacuo dilatation of the left lateral ventricle. Remote lacunar infarcts in bilateral basal ganglia. No evidence of territorial infarct or acute ischemia. No extra-axial or intracranial fluid collection. Vascular: Atherosclerosis of skullbase vasculature without hyperdense vessel or abnormal calcification. Skull: No fracture or focal lesion. Sinuses/Orbits: Paranasal sinuses and mastoid air cells are clear. The visualized orbits are unremarkable. Bilateral cataract resection. Other: Small posterior left parietal scalp hematoma. IMPRESSION: 1. Small posterior left  parietal scalp hematoma. No acute intracranial abnormality. No skull fracture. 2. Stable atrophy and chronic small vessel ischemia. Unchanged remote infarcts in left MCA distribution and bilateral basal ganglia. Electronically Signed   By: Keith Rake M.D.   On: 04/02/2018 01:52   Ct Chest Wo Contrast  Result Date: 04/04/2018 CLINICAL DATA:  Shortness of breath and fevers EXAM: CT CHEST WITHOUT CONTRAST TECHNIQUE: Multidetector CT imaging of the chest was performed following the standard protocol without IV contrast. COMPARISON:  CT of the cervical spine from 04/26/2016, recent chest x-ray from the previous day FINDINGS: Cardiovascular: Somewhat limited due to lack of IV contrast. Diffuse atherosclerotic calcifications are seen without aneurysmal dilatation. No cardiac enlargement is noted. No pericardial effusion is seen. Coronary calcifications are noted. Mediastinum/Nodes: Thoracic inlet is within normal limits. No hilar or mediastinal adenopathy is noted. The esophagus is within normal limits with the exception of a sliding-type hiatal hernia. Lungs/Pleura: Diffuse emphysematous changes are noted. Scarring is noted in the apices bilaterally stable from a prior CT  of the cervical spine. No focal infiltrate or sizable effusion is noted. No sizable parenchymal nodules are seen. No pneumothorax is noted. Upper Abdomen: No acute abnormality in the upper abdomen is seen. Musculoskeletal: Degenerative changes of the thoracic spine are noted. T6 compression deformity is noted stable from a prior exam from 2016. Changes of prior augmentation at L1 are seen. IMPRESSION: Chronic scarring in the apices bilaterally. Underlying emphysematous changes noted without acute abnormality. Small sliding-type hiatal hernia. Chronic changes in the thoracic spine. Aortic Atherosclerosis (ICD10-I70.0) and Emphysema (ICD10-J43.9). Electronically Signed   By: Inez Catalina M.D.   On: 04/04/2018 13:05   Nm Pulmonary Perf And  Vent  Result Date: 04/04/2018 CLINICAL DATA:  Shortness of breath and fever.  Tachycardia. EXAM: NUCLEAR MEDICINE VENTILATION - PERFUSION LUNG SCAN VIEWS: Anterior, posterior, left lateral, right lateral, RPO, LPO, RAO, LAO-ventilation and perfusion RADIOPHARMACEUTICALS:  31.5 mCi of Tc-30m DTPA aerosol inhalation and 4.4 mCi Tc63m MAA IV COMPARISON:  Chest radiograph April 03, 2024; chest CT April 04, 2018 FINDINGS: Ventilation: There is decreased radiotracer uptake in the lung apex regions bilaterally in a nonsegmental distribution. Elsewhere, ventilation uptake is symmetric bilaterally. No segmental level ventilation defects are evident. Perfusion: There Is mild decreased uptake in the apices in a distribution which matches a ventilation defects in the apices. No segmental level perfusion defect is evident. Note that on CT, there is opacity in each lung apex, more severe on the right than on the left which corresponds to the areas of decreased radiotracer uptake on the ventilation and perfusion studies. IMPRESSION: Matching apical defects, larger on the right than on the left with abnormal opacity on current chest CT in these areas. No appreciable ventilation/perfusion mismatch. No segmental level ventilation or perfusion defect. These findings constitute a low probability of pulmonary embolism based on PIOPED II criteria. Electronically Signed   By: Lowella Grip III M.D.   On: 04/04/2018 12:44   Dg Chest Portable 1 View  Result Date: 04/03/2018 CLINICAL DATA:  Altered mental status EXAM: PORTABLE CHEST 1 VIEW COMPARISON:  03/30/2018, 06/30/2016 FINDINGS: Streaky bibasilar atelectasis. No focal consolidation or effusion. Normal cardiomediastinal silhouette with aortic atherosclerosis. No pneumothorax. Old distal right clavicle fracture. IMPRESSION: Low lung volumes with hazy bibasilar atelectasis. Electronically Signed   By: Donavan Foil M.D.   On: 04/03/2018 19:30    DISCHARGE  EXAMINATION: Vitals:   04/07/18 0558 04/07/18 1018 04/07/18 1020 04/07/18 1028  BP: (!) 133/58     Pulse: 69     Resp: 16     Temp: 98.1 F (36.7 C)     TempSrc: Oral     SpO2: 96% 96% 94% 93%  Weight:      Height:       General appearance: alert, cooperative, appears stated age, distracted and no distress Resp: clear to auscultation bilaterally Cardio: regular rate and rhythm, S1, S2 normal, no murmur, click, rub or gallop GI: soft, non-tender; bowel sounds normal; no masses,  no organomegaly  DISPOSITION: Memory care unit/assisted living facility  Discharge Instructions    Call MD for:  difficulty breathing, headache or visual disturbances   Complete by:  As directed    Call MD for:  extreme fatigue   Complete by:  As directed    Call MD for:  persistant dizziness or light-headedness   Complete by:  As directed    Call MD for:  persistant nausea and vomiting   Complete by:  As directed    Call MD for:  severe uncontrolled pain   Complete by:  As directed    Call MD for:  temperature >100.4   Complete by:  As directed    Discharge instructions   Complete by:  As directed    Please review instructions under the discharge summary  You were cared for by a hospitalist during your hospital stay. If you have any questions about your discharge medications or the care you received while you were in the hospital after you are discharged, you can call the unit and asked to speak with the hospitalist on call if the hospitalist that took care of you is not available. Once you are discharged, your primary care physician will handle any further medical issues. Please note that NO REFILLS for any discharge medications will be authorized once you are discharged, as it is imperative that you return to your primary care physician (or establish a relationship with a primary care physician if you do not have one) for your aftercare needs so that they can reassess your need for medications and  monitor your lab values. If you do not have a primary care physician, you can call [phone] for a physician referral.   Increase activity slowly   Complete by:  As directed          Allergies as of 04/07/2018      Reactions   Amoxapine And Related Nausea And Vomiting   Amoxicillin    itching   Ciprocinonide [fluocinolone]    Pt unsure   Ciprofloxacin    Pt unsure   Doxycycline Nausea And Vomiting   Erythromycin Hives   Sulfa Antibiotics Other (See Comments)   Unknown-reaction as a child   Zofran [ondansetron Hcl] Nausea And Vomiting      Medication List    STOP taking these medications   amLODipine 5 MG tablet Commonly known as:  NORVASC   loperamide 2 MG capsule Commonly known as:  IMODIUM     TAKE these medications   acetaminophen 500 MG tablet Commonly known as:  TYLENOL Take 500 mg by mouth every 4 (four) hours as needed for mild pain.   atorvastatin 20 MG tablet Commonly known as:  LIPITOR Take 1 tablet (20 mg total) by mouth daily at 6 PM.   b complex vitamins tablet Take 1 tablet by mouth daily.   clopidogrel 75 MG tablet Commonly known as:  PLAVIX Take 75 mg by mouth daily.   gabapentin 300 MG capsule Commonly known as:  NEURONTIN Take 300 mg by mouth at bedtime.   guaiFENesin 100 MG/5ML Soln Commonly known as:  ROBITUSSIN Take 10 mLs by mouth every 6 (six) hours as needed for cough or to loosen phlegm.   levofloxacin 250 MG tablet Commonly known as:  LEVAQUIN Take 1 tablet (250 mg total) by mouth daily for 2 days.   losartan 100 MG tablet Commonly known as:  COZAAR Take 100 mg by mouth daily.   magnesium hydroxide 400 MG/5ML suspension Commonly known as:  MILK OF MAGNESIA Take 30 mLs by mouth at bedtime as needed for mild constipation.   [place] 200-200-20 MG/5ML suspension Generic drug:  alum & mag hydroxide-simeth Take 30 mLs by mouth every 6 (six) hours as needed for indigestion or heartburn.   mirtazapine 30 MG tablet Commonly  known as:  REMERON Take 30 mg by mouth at bedtime.   mometasone-formoterol 200-5 MCG/ACT Aero Commonly known as:  DULERA Inhale 1 puff into the lungs 2 (two) times daily.   OLANZapine 15  MG tablet Commonly known as:  ZYPREXA Take 7.5 mg by mouth 2 (two) times daily.   oxybutynin 5 MG tablet Commonly known as:  DITROPAN TAKE 1/2 TO 1 TABLET BY MOUTH THREE TIMES DAILY FOR BLADDER CONTROL What changed:  See the new instructions.   polyethylene glycol packet Commonly known as:  MIRALAX / GLYCOLAX Take 17 g by mouth daily. Start taking on:  April 08, 2018   traMADol 50 MG tablet Commonly known as:  ULTRAM Take 1 tablet (50 mg total) by mouth 2 (two) times daily as needed. What changed:    when to take this  reasons to take this   traZODone 50 MG tablet Commonly known as:  DESYREL Take 50 mg by mouth at bedtime as needed for sleep.   trolamine salicylate 10 % cream Commonly known as:  ASPERCREME Apply 1 application topically 2 (two) times daily as needed for muscle pain.   vitamin C 500 MG tablet Commonly known as:  ASCORBIC ACID Take 500 mg by mouth daily.          TOTAL DISCHARGE TIME: 35 minutes  Davelyn Gwinn [employer] on [email or web address]  04/07/2018, 12:12 PM

## 2018-04-09 ENCOUNTER — Other Ambulatory Visit: Payer: Self-pay | Admitting: *Deleted

## 2018-04-09 LAB — CULTURE, BLOOD (ROUTINE X 2)
Culture: NO GROWTH
SPECIAL REQUESTS: ADEQUATE

## 2018-04-09 NOTE — Patient Outreach (Signed)
[facility]) Care Management  04/09/2018  MAECYN PANNING Dec 03, 1945 [id number]   Transition of Care Referral   Referral Date: 04/09/2018 Referral Source:  [facility] inpatient referral  Date of Admission: 04/03/2018 Diagnosis: acute respiratory failure with hypoxia, acute bronchitis, AKI, HTN, vascular dementia Date of Discharge: on 04/07/2018  Facility:  [place]: [facility]   This patient was seen by [facility] hospital liaison, Lonn Georgia, on 04/07/2018 and found to have No identifiable [facility] Care Management needs.  Outreach attempt # 1  Patient is able to verify HIPAA Reviewed and addressed Transitional of care referral with patient  Social: Pt lives at memory care unit of [facility] assisted living and will be receiving [facility] for home hospice   Conditions: acute respiratory failure with hypoxia, acute bronchitis, AKI, HTN, vascular dementia, normocytic anemia, right lateral clavicle fx, constipation    Plan: [facility] RN CM will close this case as this patient is enrolled in an external care management program     Kimberly L. Lavina Hamman, RN, BSN, [facility] Care Coordinator Direct Number [phone] Mobile number [phone]  Main [facility] number [phone] Fax number [phone]

## 2018-04-10 DIAGNOSIS — Z961 Presence of intraocular lens: Secondary | ICD-10-CM | POA: Diagnosis not present

## 2018-04-10 DIAGNOSIS — H01004 Unspecified blepharitis left upper eyelid: Secondary | ICD-10-CM | POA: Diagnosis not present

## 2018-04-10 DIAGNOSIS — H01001 Unspecified blepharitis right upper eyelid: Secondary | ICD-10-CM | POA: Diagnosis not present

## 2018-04-10 DIAGNOSIS — E119 Type 2 diabetes mellitus without complications: Secondary | ICD-10-CM | POA: Diagnosis not present

## 2018-04-14 DIAGNOSIS — M6281 Muscle weakness (generalized): Secondary | ICD-10-CM | POA: Diagnosis not present

## 2018-04-14 DIAGNOSIS — S0101XA Laceration without foreign body of scalp, initial encounter: Secondary | ICD-10-CM | POA: Diagnosis not present

## 2018-04-14 DIAGNOSIS — F0391 Unspecified dementia with behavioral disturbance: Secondary | ICD-10-CM | POA: Diagnosis not present

## 2018-04-14 DIAGNOSIS — J441 Chronic obstructive pulmonary disease with (acute) exacerbation: Secondary | ICD-10-CM | POA: Diagnosis not present

## 2018-04-14 DIAGNOSIS — Z4802 Encounter for removal of sutures: Secondary | ICD-10-CM | POA: Diagnosis not present

## 2018-04-14 DIAGNOSIS — R296 Repeated falls: Secondary | ICD-10-CM | POA: Diagnosis not present

## 2018-04-14 DIAGNOSIS — R627 Adult failure to thrive: Secondary | ICD-10-CM | POA: Diagnosis not present

## 2018-04-14 DIAGNOSIS — G300 Alzheimer's disease with early onset: Secondary | ICD-10-CM | POA: Diagnosis not present

## 2018-04-14 DIAGNOSIS — Z760 Encounter for issue of repeat prescription: Secondary | ICD-10-CM | POA: Diagnosis not present

## 2018-04-17 DIAGNOSIS — M25511 Pain in right shoulder: Secondary | ICD-10-CM | POA: Diagnosis not present

## 2018-04-21 DIAGNOSIS — I10 Essential (primary) hypertension: Secondary | ICD-10-CM | POA: Diagnosis not present

## 2018-04-22 DIAGNOSIS — I10 Essential (primary) hypertension: Secondary | ICD-10-CM | POA: Diagnosis not present

## 2018-04-23 DIAGNOSIS — I10 Essential (primary) hypertension: Secondary | ICD-10-CM | POA: Diagnosis not present

## 2018-04-24 DIAGNOSIS — I10 Essential (primary) hypertension: Secondary | ICD-10-CM | POA: Diagnosis not present

## 2018-04-25 DIAGNOSIS — I10 Essential (primary) hypertension: Secondary | ICD-10-CM | POA: Diagnosis not present

## 2018-04-26 DIAGNOSIS — I10 Essential (primary) hypertension: Secondary | ICD-10-CM | POA: Diagnosis not present

## 2018-04-27 DIAGNOSIS — I10 Essential (primary) hypertension: Secondary | ICD-10-CM | POA: Diagnosis not present

## 2018-04-28 DIAGNOSIS — I10 Essential (primary) hypertension: Secondary | ICD-10-CM | POA: Diagnosis not present

## 2018-04-29 DIAGNOSIS — I10 Essential (primary) hypertension: Secondary | ICD-10-CM | POA: Diagnosis not present

## 2018-04-30 DIAGNOSIS — I10 Essential (primary) hypertension: Secondary | ICD-10-CM | POA: Diagnosis not present

## 2018-05-01 DIAGNOSIS — I10 Essential (primary) hypertension: Secondary | ICD-10-CM | POA: Diagnosis not present

## 2018-05-02 DIAGNOSIS — I10 Essential (primary) hypertension: Secondary | ICD-10-CM | POA: Diagnosis not present

## 2018-05-03 DIAGNOSIS — I10 Essential (primary) hypertension: Secondary | ICD-10-CM | POA: Diagnosis not present

## 2018-05-04 DIAGNOSIS — I10 Essential (primary) hypertension: Secondary | ICD-10-CM | POA: Diagnosis not present

## 2018-05-05 DIAGNOSIS — I10 Essential (primary) hypertension: Secondary | ICD-10-CM | POA: Diagnosis not present

## 2018-05-06 DIAGNOSIS — I10 Essential (primary) hypertension: Secondary | ICD-10-CM | POA: Diagnosis not present

## 2018-05-07 DIAGNOSIS — I10 Essential (primary) hypertension: Secondary | ICD-10-CM | POA: Diagnosis not present

## 2018-05-08 DIAGNOSIS — I10 Essential (primary) hypertension: Secondary | ICD-10-CM | POA: Diagnosis not present

## 2018-05-09 DIAGNOSIS — I10 Essential (primary) hypertension: Secondary | ICD-10-CM | POA: Diagnosis not present

## 2018-05-10 DIAGNOSIS — I10 Essential (primary) hypertension: Secondary | ICD-10-CM | POA: Diagnosis not present

## 2018-05-11 DIAGNOSIS — I10 Essential (primary) hypertension: Secondary | ICD-10-CM | POA: Diagnosis not present

## 2018-05-12 DIAGNOSIS — I10 Essential (primary) hypertension: Secondary | ICD-10-CM | POA: Diagnosis not present

## 2018-05-13 DIAGNOSIS — I10 Essential (primary) hypertension: Secondary | ICD-10-CM | POA: Diagnosis not present

## 2018-05-14 DIAGNOSIS — I10 Essential (primary) hypertension: Secondary | ICD-10-CM | POA: Diagnosis not present

## 2018-05-15 DIAGNOSIS — I10 Essential (primary) hypertension: Secondary | ICD-10-CM | POA: Diagnosis not present

## 2018-05-16 DIAGNOSIS — I10 Essential (primary) hypertension: Secondary | ICD-10-CM | POA: Diagnosis not present

## 2018-05-17 DIAGNOSIS — I10 Essential (primary) hypertension: Secondary | ICD-10-CM | POA: Diagnosis not present

## 2018-05-18 DIAGNOSIS — I10 Essential (primary) hypertension: Secondary | ICD-10-CM | POA: Diagnosis not present

## 2018-05-19 DIAGNOSIS — I10 Essential (primary) hypertension: Secondary | ICD-10-CM | POA: Diagnosis not present

## 2018-05-20 DIAGNOSIS — I10 Essential (primary) hypertension: Secondary | ICD-10-CM | POA: Diagnosis not present

## 2018-05-21 DIAGNOSIS — I10 Essential (primary) hypertension: Secondary | ICD-10-CM | POA: Diagnosis not present

## 2018-05-22 DIAGNOSIS — I10 Essential (primary) hypertension: Secondary | ICD-10-CM | POA: Diagnosis not present

## 2018-05-23 DIAGNOSIS — I10 Essential (primary) hypertension: Secondary | ICD-10-CM | POA: Diagnosis not present

## 2018-05-24 DIAGNOSIS — I10 Essential (primary) hypertension: Secondary | ICD-10-CM | POA: Diagnosis not present

## 2018-05-25 DIAGNOSIS — I10 Essential (primary) hypertension: Secondary | ICD-10-CM | POA: Diagnosis not present

## 2018-05-26 DIAGNOSIS — I10 Essential (primary) hypertension: Secondary | ICD-10-CM | POA: Diagnosis not present

## 2018-05-27 DIAGNOSIS — I10 Essential (primary) hypertension: Secondary | ICD-10-CM | POA: Diagnosis not present

## 2018-05-28 DIAGNOSIS — I10 Essential (primary) hypertension: Secondary | ICD-10-CM | POA: Diagnosis not present

## 2018-05-29 DIAGNOSIS — I10 Essential (primary) hypertension: Secondary | ICD-10-CM | POA: Diagnosis not present

## 2018-05-30 DIAGNOSIS — I10 Essential (primary) hypertension: Secondary | ICD-10-CM | POA: Diagnosis not present

## 2018-05-31 DIAGNOSIS — I10 Essential (primary) hypertension: Secondary | ICD-10-CM | POA: Diagnosis not present

## 2018-06-01 DIAGNOSIS — I10 Essential (primary) hypertension: Secondary | ICD-10-CM | POA: Diagnosis not present

## 2018-06-02 DIAGNOSIS — I10 Essential (primary) hypertension: Secondary | ICD-10-CM | POA: Diagnosis not present

## 2018-06-03 DIAGNOSIS — I10 Essential (primary) hypertension: Secondary | ICD-10-CM | POA: Diagnosis not present

## 2018-06-04 DIAGNOSIS — I10 Essential (primary) hypertension: Secondary | ICD-10-CM | POA: Diagnosis not present

## 2018-06-05 DIAGNOSIS — I10 Essential (primary) hypertension: Secondary | ICD-10-CM | POA: Diagnosis not present

## 2018-06-06 DIAGNOSIS — I10 Essential (primary) hypertension: Secondary | ICD-10-CM | POA: Diagnosis not present

## 2018-06-07 DIAGNOSIS — I10 Essential (primary) hypertension: Secondary | ICD-10-CM | POA: Diagnosis not present

## 2018-06-08 DIAGNOSIS — I10 Essential (primary) hypertension: Secondary | ICD-10-CM | POA: Diagnosis not present

## 2018-06-09 DIAGNOSIS — I10 Essential (primary) hypertension: Secondary | ICD-10-CM | POA: Diagnosis not present

## 2018-06-10 DIAGNOSIS — I10 Essential (primary) hypertension: Secondary | ICD-10-CM | POA: Diagnosis not present

## 2018-06-11 DIAGNOSIS — I10 Essential (primary) hypertension: Secondary | ICD-10-CM | POA: Diagnosis not present

## 2018-06-12 DIAGNOSIS — I10 Essential (primary) hypertension: Secondary | ICD-10-CM | POA: Diagnosis not present

## 2018-06-13 DIAGNOSIS — I10 Essential (primary) hypertension: Secondary | ICD-10-CM | POA: Diagnosis not present

## 2018-06-14 DIAGNOSIS — I10 Essential (primary) hypertension: Secondary | ICD-10-CM | POA: Diagnosis not present

## 2018-06-15 DIAGNOSIS — I10 Essential (primary) hypertension: Secondary | ICD-10-CM | POA: Diagnosis not present

## 2018-06-25 DIAGNOSIS — I10 Essential (primary) hypertension: Secondary | ICD-10-CM | POA: Diagnosis not present

## 2018-06-25 DIAGNOSIS — E559 Vitamin D deficiency, unspecified: Secondary | ICD-10-CM | POA: Diagnosis not present

## 2018-06-25 DIAGNOSIS — D519 Vitamin B12 deficiency anemia, unspecified: Secondary | ICD-10-CM | POA: Diagnosis not present

## 2018-06-25 DIAGNOSIS — D649 Anemia, unspecified: Secondary | ICD-10-CM | POA: Diagnosis not present

## 2018-06-26 ENCOUNTER — Emergency Department (HOSPITAL_COMMUNITY): Payer: Medicare Other

## 2018-06-26 ENCOUNTER — Encounter (HOSPITAL_COMMUNITY): Payer: Self-pay | Admitting: *Deleted

## 2018-06-26 ENCOUNTER — Other Ambulatory Visit: Payer: Self-pay

## 2018-06-26 ENCOUNTER — Inpatient Hospital Stay (HOSPITAL_COMMUNITY)
Admission: EM | Admit: 2018-06-26 | Discharge: 2018-07-03 | DRG: 469 | Disposition: A | Discharge status: Skilled Nursing Facility | Payer: Medicare Other | Source: Skilled Nursing Facility | Attending: Family Medicine | Admitting: Family Medicine

## 2018-06-26 DIAGNOSIS — S0003XA Contusion of scalp, initial encounter: Secondary | ICD-10-CM | POA: Diagnosis present

## 2018-06-26 DIAGNOSIS — T1490XA Injury, unspecified, initial encounter: Secondary | ICD-10-CM

## 2018-06-26 DIAGNOSIS — J9601 Acute respiratory failure with hypoxia: Secondary | ICD-10-CM | POA: Diagnosis not present

## 2018-06-26 DIAGNOSIS — M549 Dorsalgia, unspecified: Secondary | ICD-10-CM

## 2018-06-26 DIAGNOSIS — K219 Gastro-esophageal reflux disease without esophagitis: Secondary | ICD-10-CM | POA: Diagnosis present

## 2018-06-26 DIAGNOSIS — Z8673 Personal history of transient ischemic attack (TIA), and cerebral infarction without residual deficits: Secondary | ICD-10-CM

## 2018-06-26 DIAGNOSIS — R627 Adult failure to thrive: Secondary | ICD-10-CM | POA: Diagnosis present

## 2018-06-26 DIAGNOSIS — J449 Chronic obstructive pulmonary disease, unspecified: Secondary | ICD-10-CM | POA: Diagnosis not present

## 2018-06-26 DIAGNOSIS — Z515 Encounter for palliative care: Secondary | ICD-10-CM | POA: Diagnosis not present

## 2018-06-26 DIAGNOSIS — S72009A Fracture of unspecified part of neck of unspecified femur, initial encounter for closed fracture: Secondary | ICD-10-CM | POA: Diagnosis present

## 2018-06-26 DIAGNOSIS — Y93E1 Activity, personal bathing and showering: Secondary | ICD-10-CM

## 2018-06-26 DIAGNOSIS — R000 Tachycardia, unspecified: Secondary | ICD-10-CM | POA: Diagnosis not present

## 2018-06-26 DIAGNOSIS — R451 Restlessness and agitation: Secondary | ICD-10-CM | POA: Diagnosis present

## 2018-06-26 DIAGNOSIS — Z79899 Other long term (current) drug therapy: Secondary | ICD-10-CM | POA: Diagnosis not present

## 2018-06-26 DIAGNOSIS — W182XXA Fall in (into) shower or empty bathtub, initial encounter: Secondary | ICD-10-CM | POA: Diagnosis present

## 2018-06-26 DIAGNOSIS — Y92121 Bathroom in nursing home as the place of occurrence of the external cause: Secondary | ICD-10-CM

## 2018-06-26 DIAGNOSIS — E860 Dehydration: Secondary | ICD-10-CM | POA: Diagnosis present

## 2018-06-26 DIAGNOSIS — Z7401 Bed confinement status: Secondary | ICD-10-CM | POA: Diagnosis not present

## 2018-06-26 DIAGNOSIS — Z66 Do not resuscitate: Secondary | ICD-10-CM | POA: Diagnosis present

## 2018-06-26 DIAGNOSIS — Z87891 Personal history of nicotine dependence: Secondary | ICD-10-CM | POA: Diagnosis not present

## 2018-06-26 DIAGNOSIS — Z96641 Presence of right artificial hip joint: Secondary | ICD-10-CM

## 2018-06-26 DIAGNOSIS — S72011A Unspecified intracapsular fracture of right femur, initial encounter for closed fracture: Secondary | ICD-10-CM | POA: Diagnosis not present

## 2018-06-26 DIAGNOSIS — D62 Acute posthemorrhagic anemia: Secondary | ICD-10-CM | POA: Diagnosis not present

## 2018-06-26 DIAGNOSIS — R1310 Dysphagia, unspecified: Secondary | ICD-10-CM | POA: Diagnosis present

## 2018-06-26 DIAGNOSIS — R0902 Hypoxemia: Secondary | ICD-10-CM

## 2018-06-26 DIAGNOSIS — W19XXXA Unspecified fall, initial encounter: Secondary | ICD-10-CM | POA: Diagnosis not present

## 2018-06-26 DIAGNOSIS — M25551 Pain in right hip: Secondary | ICD-10-CM | POA: Diagnosis present

## 2018-06-26 DIAGNOSIS — R339 Retention of urine, unspecified: Secondary | ICD-10-CM | POA: Diagnosis present

## 2018-06-26 DIAGNOSIS — S2241XA Multiple fractures of ribs, right side, initial encounter for closed fracture: Secondary | ICD-10-CM | POA: Diagnosis not present

## 2018-06-26 DIAGNOSIS — R52 Pain, unspecified: Secondary | ICD-10-CM | POA: Diagnosis not present

## 2018-06-26 DIAGNOSIS — S0101XA Laceration without foreign body of scalp, initial encounter: Secondary | ICD-10-CM | POA: Diagnosis not present

## 2018-06-26 DIAGNOSIS — S72001A Fracture of unspecified part of neck of right femur, initial encounter for closed fracture: Secondary | ICD-10-CM | POA: Diagnosis present

## 2018-06-26 DIAGNOSIS — S2249XA Multiple fractures of ribs, unspecified side, initial encounter for closed fracture: Secondary | ICD-10-CM

## 2018-06-26 DIAGNOSIS — Z7902 Long term (current) use of antithrombotics/antiplatelets: Secondary | ICD-10-CM

## 2018-06-26 DIAGNOSIS — Z7951 Long term (current) use of inhaled steroids: Secondary | ICD-10-CM | POA: Diagnosis not present

## 2018-06-26 DIAGNOSIS — F0150 Vascular dementia without behavioral disturbance: Secondary | ICD-10-CM | POA: Diagnosis not present

## 2018-06-26 DIAGNOSIS — Z23 Encounter for immunization: Secondary | ICD-10-CM

## 2018-06-26 DIAGNOSIS — R410 Disorientation, unspecified: Secondary | ICD-10-CM | POA: Diagnosis not present

## 2018-06-26 DIAGNOSIS — M2550 Pain in unspecified joint: Secondary | ICD-10-CM | POA: Diagnosis not present

## 2018-06-26 DIAGNOSIS — I10 Essential (primary) hypertension: Secondary | ICD-10-CM | POA: Diagnosis present

## 2018-06-26 DIAGNOSIS — D72829 Elevated white blood cell count, unspecified: Secondary | ICD-10-CM | POA: Diagnosis present

## 2018-06-26 DIAGNOSIS — Y92129 Unspecified place in nursing home as the place of occurrence of the external cause: Secondary | ICD-10-CM

## 2018-06-26 DIAGNOSIS — M25561 Pain in right knee: Secondary | ICD-10-CM

## 2018-06-26 DIAGNOSIS — S72041A Displaced fracture of base of neck of right femur, initial encounter for closed fracture: Secondary | ICD-10-CM | POA: Diagnosis not present

## 2018-06-26 LAB — TYPE AND SCREEN
ABO/RH(D): O POS
Antibody Screen: NEGATIVE

## 2018-06-26 LAB — BASIC METABOLIC PANEL
Anion gap: 13 (ref 5–15)
BUN: 7 mg/dL — ABNORMAL LOW (ref 8–23)
CO2: 24 mmol/L (ref 22–32)
Calcium: 9.3 mg/dL (ref 8.9–10.3)
Chloride: 99 mmol/L (ref 98–111)
Creatinine, Ser: 0.96 mg/dL (ref 0.44–1.00)
GFR calc Af Amer: >60 mL/min (ref >60)
GFR calc non Af Amer: 58 mL/min — ABNORMAL LOW (ref >60)
Glucose, Bld: 135 mg/dL — ABNORMAL HIGH (ref 70–99)
Potassium: 3.9 mmol/L (ref 3.5–5.1)
Sodium: 136 mmol/L (ref 135–145)

## 2018-06-26 LAB — CBC WITH DIFFERENTIAL/PLATELET
Abs Immature Granulocytes: 0.26 10*3/uL — ABNORMAL HIGH (ref 0.00–0.07)
Basophils Absolute: 0.1 10*3/uL (ref 0.0–0.1)
Basophils Relative: 0 %
Eosinophils Absolute: 0.1 10*3/uL (ref 0.0–0.5)
Eosinophils Relative: 1 %
HCT: 46.1 % — ABNORMAL HIGH (ref 36.0–46.0)
Hemoglobin: 15.2 g/dL — ABNORMAL HIGH (ref 12.0–15.0)
Immature Granulocytes: 1 %
Lymphocytes Relative: 10 %
Lymphs Abs: 2.4 10*3/uL (ref 0.7–4.0)
MCH: 29.9 pg (ref 26.0–34.0)
MCHC: 33 g/dL (ref 30.0–36.0)
MCV: 90.7 fL (ref 80.0–100.0)
Monocytes Absolute: 1.7 10*3/uL — ABNORMAL HIGH (ref 0.1–1.0)
Monocytes Relative: 7 %
Neutro Abs: 18.7 10*3/uL — ABNORMAL HIGH (ref 1.7–7.7)
Neutrophils Relative %: 81 %
Platelets: 398 10*3/uL (ref 150–400)
RBC: 5.08 MIL/uL (ref 3.87–5.11)
RDW: 13.2 % (ref 11.5–15.5)
WBC: 23.3 10*3/uL — ABNORMAL HIGH (ref 4.0–10.5)
nRBC: 0 % (ref 0.0–0.2)

## 2018-06-26 LAB — PROTIME-INR
INR: 1 (ref 0.8–1.2)
Prothrombin Time: 13.3 seconds (ref 11.4–15.2)

## 2018-06-26 LAB — ABO/RH: ABO/RH(D): O POS

## 2018-06-26 MED ORDER — LOSARTAN POTASSIUM 50 MG PO TABS
100.0000 mg | ORAL_TABLET | Freq: Every day | ORAL | Status: DC
  Administered 2018-06-28 – 2018-07-03 (×6): 100 mg via ORAL
  Filled 2018-06-26 (×6): qty 2

## 2018-06-26 MED ORDER — GUAIFENESIN 100 MG/5ML PO SOLN: 10.0000 mL | Freq: Four times a day (QID) | ORAL | Status: DC | PRN

## 2018-06-26 MED ORDER — POVIDONE-IODINE 10 % EX SWAB: 2.0000 "application " | Freq: Once | CUTANEOUS | Status: DC

## 2018-06-26 MED ORDER — MIRTAZAPINE 15 MG PO TABS
30.0000 mg | ORAL_TABLET | Freq: Every day | ORAL | Status: DC
  Administered 2018-06-26 – 2018-07-02 (×7): 30 mg via ORAL
  Filled 2018-06-26: qty 2
  Filled 2018-06-26: qty 1
  Filled 2018-06-26 (×6): qty 2

## 2018-06-26 MED ORDER — ACETAMINOPHEN 325 MG PO TABS
650.0000 mg | ORAL_TABLET | Freq: Four times a day (QID) | ORAL | Status: DC
  Administered 2018-06-27 – 2018-07-03 (×20): 650 mg via ORAL
  Filled 2018-06-26 (×23): qty 2

## 2018-06-26 MED ORDER — CEFAZOLIN SODIUM-DEXTROSE 2-4 GM/100ML-% IV SOLN
2.0000 g | INTRAVENOUS | Status: AC
  Administered 2018-06-27: 2 g via INTRAVENOUS
  Filled 2018-06-26 (×2): qty 100

## 2018-06-26 MED ORDER — TETANUS-DIPHTH-ACELL PERTUSSIS 5-2.5-18.5 LF-MCG/0.5 IM SUSP
0.5000 mL | Freq: Once | INTRAMUSCULAR | Status: AC
  Administered 2018-06-26: 0.5 mL via INTRAMUSCULAR
  Filled 2018-06-26: qty 0.5

## 2018-06-26 MED ORDER — HYOSCYAMINE SULFATE 0.125 MG PO TABS
0.1250 mg | ORAL_TABLET | ORAL | Status: DC | PRN
  Filled 2018-06-26 (×2): qty 1

## 2018-06-26 MED ORDER — ACETAMINOPHEN 500 MG PO TABS
1000.0000 mg | ORAL_TABLET | Freq: Once | ORAL | Status: AC
  Administered 2018-06-26: 1000 mg via ORAL
  Filled 2018-06-26: qty 2

## 2018-06-26 MED ORDER — LORAZEPAM 0.5 MG PO TABS: 0.5000 mg | ORAL_TABLET | ORAL | Status: DC | PRN

## 2018-06-26 MED ORDER — GABAPENTIN 300 MG PO CAPS
300.0000 mg | ORAL_CAPSULE | Freq: Every day | ORAL | Status: DC
  Administered 2018-06-26 – 2018-07-02 (×7): 300 mg via ORAL
  Filled 2018-06-26 (×7): qty 1

## 2018-06-26 MED ORDER — MORPHINE SULFATE (CONCENTRATE) 10 MG/0.5ML PO SOLN: 10.0000 mg | ORAL | Status: DC | PRN

## 2018-06-26 MED ORDER — SODIUM CHLORIDE 0.9 % IV BOLUS
500.0000 mL | Freq: Once | INTRAVENOUS | Status: AC
  Administered 2018-06-26: 500 mL via INTRAVENOUS

## 2018-06-26 MED ORDER — B COMPLEX-C PO TABS
1.0000 | ORAL_TABLET | Freq: Every day | ORAL | Status: DC
  Administered 2018-06-28 – 2018-07-03 (×6): 1 via ORAL
  Filled 2018-06-26 (×7): qty 1

## 2018-06-26 MED ORDER — IPRATROPIUM-ALBUTEROL 0.5-2.5 (3) MG/3ML IN SOLN: 3.0000 mL | Freq: Four times a day (QID) | RESPIRATORY_TRACT | Status: DC | PRN

## 2018-06-26 MED ORDER — MAGNESIUM HYDROXIDE 400 MG/5ML PO SUSP: 30.0000 mL | Freq: Every evening | ORAL | Status: DC | PRN

## 2018-06-26 MED ORDER — FENTANYL CITRATE (PF) 100 MCG/2ML IJ SOLN
25.0000 ug | Freq: Once | INTRAMUSCULAR | Status: AC
  Administered 2018-06-26: 25 ug via INTRAVENOUS
  Filled 2018-06-26: qty 2

## 2018-06-26 MED ORDER — TRAMADOL HCL 50 MG PO TABS
50.0000 mg | ORAL_TABLET | Freq: Two times a day (BID) | ORAL | Status: DC | PRN
  Administered 2018-06-26 – 2018-06-30 (×5): 50 mg via ORAL
  Filled 2018-06-26 (×5): qty 1

## 2018-06-26 MED ORDER — TRAZODONE HCL 50 MG PO TABS: 50.0000 mg | ORAL_TABLET | Freq: Every evening | ORAL | Status: DC | PRN

## 2018-06-26 MED ORDER — ATORVASTATIN CALCIUM 10 MG PO TABS
20.0000 mg | ORAL_TABLET | Freq: Every day | ORAL | Status: DC
  Administered 2018-06-27 – 2018-07-02 (×6): 20 mg via ORAL
  Filled 2018-06-26 (×6): qty 2

## 2018-06-26 MED ORDER — ALUM & MAG HYDROXIDE-SIMETH 200-200-20 MG/5ML PO SUSP: 30.0000 mL | Freq: Four times a day (QID) | ORAL | Status: DC | PRN

## 2018-06-26 MED ORDER — VITAMIN C 500 MG PO TABS
500.0000 mg | ORAL_TABLET | Freq: Every day | ORAL | Status: DC
  Administered 2018-06-28 – 2018-07-03 (×6): 500 mg via ORAL
  Filled 2018-06-26 (×6): qty 1

## 2018-06-26 MED ORDER — HYOSCYAMINE SULFATE 0.125 MG SL SUBL
0.1250 mg | SUBLINGUAL_TABLET | SUBLINGUAL | Status: DC | PRN
  Filled 2018-06-26: qty 1

## 2018-06-26 MED ORDER — CHLORHEXIDINE GLUCONATE 4 % EX LIQD
60.0000 mL | Freq: Once | CUTANEOUS | Status: AC
  Administered 2018-06-26: 4 via TOPICAL
  Filled 2018-06-26: qty 60

## 2018-06-26 MED ORDER — POLYETHYLENE GLYCOL 3350 17 G PO PACK: 17.0000 g | PACK | Freq: Every day | ORAL | Status: DC | PRN

## 2018-06-26 MED ORDER — HYOSCYAMINE SULFATE 0.125 MG PO TBDP
0.1250 mg | ORAL_TABLET | ORAL | Status: DC | PRN
  Filled 2018-06-26: qty 1

## 2018-06-26 MED ORDER — OLANZAPINE 5 MG PO TABS
7.5000 mg | ORAL_TABLET | Freq: Two times a day (BID) | ORAL | Status: DC
  Administered 2018-06-26 – 2018-07-03 (×13): 7.5 mg via ORAL
  Filled 2018-06-26 (×2): qty 2
  Filled 2018-06-26 (×2): qty 1
  Filled 2018-06-26 (×3): qty 2
  Filled 2018-06-26 (×2): qty 1.5
  Filled 2018-06-26 (×4): qty 2

## 2018-06-26 MED ORDER — LOPERAMIDE HCL 2 MG PO CAPS: 2.0000 mg | ORAL_CAPSULE | ORAL | Status: DC | PRN

## 2018-06-26 NOTE — ED Notes (Signed)
General surgery PA at beside discussing plan of care with pt

## 2018-06-26 NOTE — ED Notes (Signed)
ED TO INPATIENT HANDOFF REPORT  ED Nurse Name and Phone #:  Dorian Pod [phone]  S Name/Age/Gender [name] 73 y.o. female Room/Bed: 014C/014C  Code Status   Code Status: Prior  Home/SNF/Other Skilled nursing facility Patient oriented to: self, can tell me she is in hospital Is this baseline? yes  Triage Complete: Triage complete  Chief Complaint fall  Triage Note PER EMS ,Pt lives ay [place] and fell in the bathroom and hit the back of her head. Pt also has lac to back of head . Pt reports pain to RT hip .   Allergies Allergies  Allergen Reactions  . Amoxapine And Related Nausea And Vomiting  . Amoxicillin Itching    itching  . Ciprocinonide [Fluocinolone] Other (See Comments)    Pt unsure  . Ciprofloxacin Other (See Comments)    Pt unsure  . Doxycycline Nausea And Vomiting  . Erythromycin Hives  . Sulfa Antibiotics Other (See Comments)    Unknown-reaction as a child  . Zofran [Ondansetron Hcl] Nausea And Vomiting    Level of Care/Admitting Diagnosis ED Disposition    ED Disposition Condition [facility] Area: [place] [100100]  Level of Care: Med-Surg [16]  Diagnosis: Hip fracture [facility]) [[id number]]  Admitting Physician: Lady Deutscher [[id number]]  Attending Physician: Lady Deutscher [[id number]]  Estimated length of stay: 3 - 4 days  Certification:: I certify this patient will need inpatient services for at least 2 midnights  PT Class (Do Not Modify): Inpatient [101]  PT Acc Code (Do Not Modify): Private [1]       B Medical/Surgery History Past Medical History:  Diagnosis Date  . Carotid artery aneurysm ([place]) 03/04/2012  . COPD (chronic obstructive pulmonary disease) ([place])   . Depression   . DJD (degenerative joint disease)   . GERD (gastroesophageal reflux disease)   . Hypertension   . Stroke [facility])    was on Plavix - not now  . Vascular dementia [facility])    Past Surgical History:  Procedure Laterality  Date  . CATARACT EXTRACTION Bilateral   . COLONOSCOPY  2004  . HERNIA REPAIR Right 2014   inguinal  . KYPHOPLASTY N/A 10/28/2014   Procedure: KYPHOPLASTY;  Surgeon: Phylliss Bob, MD;  Location: [place];  Service: Orthopedics;  Laterality: N/A;  Lumbar 1 kyphoplasty  . MICROLARYNGOSCOPY WITH CO2 LASER AND EXCISION OF VOCAL CORD LESION N/A 06/05/2013   Procedure: MICROLARYNGOSCOPY WITH CO2 LASER AND EXCISION OF VOCAL CORD POLYPS;  Surgeon: Melida Quitter, MD;  Location: [place];  Service: ENT;  Laterality: N/A;  . TONSILLECTOMY    . TUBAL LIGATION  30 years  . VIDEO BRONCHOSCOPY  04/24/2012   Procedure: VIDEO BRONCHOSCOPY WITHOUT FLUORO;  Surgeon: Rigoberto Noel, MD;  Location: [place];  Service: Cardiopulmonary;  Laterality: Bilateral;     A IV Location/Drains/Wounds Patient Lines/Drains/Airways Status   Active Line/Drains/Airways    Name:   Placement date:   Placement time:   Site:   Days:   Peripheral IV 06/26/18 Right Antecubital   06/26/18    1314    Antecubital   less than 1   External Urinary Catheter   04/04/18    1428    -   83   Incision (Closed) 06/05/13 N/A Other (Comment)   06/05/13    1242     1847   Incision (Closed) 10/28/14 Back   10/28/14    1056     1337  Intake/Output Last 24 hours No intake or output data in the 24 hours ending 06/26/18 2003  Labs/Imaging Results for orders placed or performed during the hospital encounter of 06/26/18 (from the past 48 hour(s))  Basic metabolic panel     Status: Abnormal   Collection Time: 06/26/18  3:06 PM  Result Value Ref Range   Sodium 136 135 - 145 mmol/L   Potassium 3.9 3.5 - 5.1 mmol/L   Chloride 99 98 - 111 mmol/L   CO2 24 22 - 32 mmol/L   Glucose, Bld 135 (H) 70 - 99 mg/dL   BUN 7 (L) 8 - 23 mg/dL   Creatinine, Ser 0.96 0.44 - 1.00 mg/dL   Calcium 9.3 8.9 - 10.3 mg/dL   GFR calc non Af Amer 58 (L) >60 mL/min   GFR calc Af Amer >60 >60 mL/min   Anion gap 13 5 - 15    Comment: Performed at [facility], [place] [address] [place], [place] [address]  CBC with Differential     Status: Abnormal   Collection Time: 06/26/18  3:06 PM  Result Value Ref Range   WBC 23.3 (H) 4.0 - 10.5 K/uL   RBC 5.08 3.87 - 5.11 MIL/uL   Hemoglobin 15.2 (H) 12.0 - 15.0 g/dL   HCT 46.1 (H) 36.0 - 46.0 %   MCV 90.7 80.0 - 100.0 fL   MCH 29.9 26.0 - 34.0 pg   MCHC 33.0 30.0 - 36.0 g/dL   RDW 13.2 11.5 - 15.5 %   Platelets 398 150 - 400 K/uL   nRBC 0.0 0.0 - 0.2 %   Neutrophils Relative % 81 %   Neutro Abs 18.7 (H) 1.7 - 7.7 K/uL   Lymphocytes Relative 10 %   Lymphs Abs 2.4 0.7 - 4.0 K/uL   Monocytes Relative 7 %   Monocytes Absolute 1.7 (H) 0.1 - 1.0 K/uL   Eosinophils Relative 1 %   Eosinophils Absolute 0.1 0.0 - 0.5 K/uL   Basophils Relative 0 %   Basophils Absolute 0.1 0.0 - 0.1 K/uL   Immature Granulocytes 1 %   Abs Immature Granulocytes 0.26 (H) 0.00 - 0.07 K/uL    Comment: Performed at [facility] [address] [place], [place] [address]  Type and screen [place]     Status: None   Collection Time: 06/26/18  3:08 PM  Result Value Ref Range   ABO/RH(D) O POS    Antibody Screen NEG    Sample Expiration      06/29/2018 Performed at [facility], [place] [address] [place], [place] [address]   ABO/Rh     Status: None   Collection Time: 06/26/18  3:40 PM  Result Value Ref Range   ABO/RH(D)      O POS Performed at [facility] [address], [place], [place] [address]    Dg Chest 1 View  Result Date: 06/26/2018 CLINICAL DATA:  Fall. EXAM: CHEST  1 VIEW COMPARISON:  04/03/2018 FINDINGS: The heart size and mediastinal contours are within normal limits. There is no evidence of pulmonary edema, consolidation, pneumothorax, nodule or pleural fluid. There appear to be new fractures of the right third, fourth, fifth and sixth ribs demonstrating minimal angulation/displacement. IMPRESSION: New fractures of the right third, fourth, fifth and sixth ribs  demonstrating minimal angulation and displacement. No evidence of associated pneumothorax or pleural fluid. Electronically Signed   By: Aletta Edouard M.D.   On:  06/26/2018 16:57   Dg Pelvis 1-2 Views  Result Date: 06/26/2018 CLINICAL DATA:  PER EMS, Pt lives ay [place] and fell in the bathroom and hit the back of her head. Pt also has lac to back of head . Pt reports pain to RT hip . EXAM: PELVIS - 1-2 VIEW COMPARISON:  CT, 09/19/2009. FINDINGS: Fracture of the right femoral neck, subcapital to mid femoral neck, nondisplaced and non comminuted, but with valgus angulation. No other fractures. No bone lesions. Hip joints, SI joints and symphysis pubis are normally spaced and aligned. Skeletal structures are demineralized. Soft tissues are unremarkable. IMPRESSION: 1. Nondisplaced, non comminuted, valgus angulated fracture of the right femoral neck. 2. No other fractures.  No dislocation. Electronically Signed   By: Lajean Manes M.D.   On: 06/26/2018 14:55   Ct Head Wo Contrast  Result Date: 06/26/2018 CLINICAL DATA:  Head trauma, headache. Neck pain. EXAM: CT HEAD WITHOUT CONTRAST CT CERVICAL SPINE WITHOUT CONTRAST TECHNIQUE: Multidetector CT imaging of the head and cervical spine was performed following the standard protocol without intravenous contrast. Multiplanar CT image reconstructions of the cervical spine were also generated. COMPARISON:  04/02/2018 FINDINGS: CT HEAD FINDINGS Brain: No evidence of acute infarction, hemorrhage, hydrocephalus, extra-axial collection or mass lesion/mass effect. Generalized atrophy. Hypoattenuation of white matter, likely chronic microvascular ischemic change. Chronic LEFT parietal infarct, significant volume loss. Vascular: Calcification of the cavernous internal carotid arteries consistent with cerebrovascular atherosclerotic disease. No signs of intracranial large vessel occlusion. Skull: Calvarium intact. Sinuses/Orbits: No layering sinus fluid. Negative  orbits. Other: Large RIGHT parietal scalp hematoma. CT CERVICAL SPINE FINDINGS Alignment: Normal. Skull base and vertebrae: No acute fracture. No primary bone lesion or focal pathologic process. Soft tissues and spinal canal: No prevertebral fluid or swelling. No visible canal hematoma. BILATERAL carotid bifurcation calcification. Disc levels:  No traumatic or calcified disc herniation. Upper chest: Biapical scarring and emphysematous change, greater on the RIGHT, without progression from January 2020 CT chest. Other: None. IMPRESSION: 1. No skull fracture or intracranial hemorrhage. Large RIGHT parietal scalp hematoma. 2. No cervical spine fracture or traumatic subluxation. Electronically Signed   By: Staci Righter M.D.   On: 06/26/2018 13:50   Ct Cervical Spine Wo Contrast  Result Date: 06/26/2018 CLINICAL DATA:  Head trauma, headache. Neck pain. EXAM: CT HEAD WITHOUT CONTRAST CT CERVICAL SPINE WITHOUT CONTRAST TECHNIQUE: Multidetector CT imaging of the head and cervical spine was performed following the standard protocol without intravenous contrast. Multiplanar CT image reconstructions of the cervical spine were also generated. COMPARISON:  04/02/2018 FINDINGS: CT HEAD FINDINGS Brain: No evidence of acute infarction, hemorrhage, hydrocephalus, extra-axial collection or mass lesion/mass effect. Generalized atrophy. Hypoattenuation of white matter, likely chronic microvascular ischemic change. Chronic LEFT parietal infarct, significant volume loss. Vascular: Calcification of the cavernous internal carotid arteries consistent with cerebrovascular atherosclerotic disease. No signs of intracranial large vessel occlusion. Skull: Calvarium intact. Sinuses/Orbits: No layering sinus fluid. Negative orbits. Other: Large RIGHT parietal scalp hematoma. CT CERVICAL SPINE FINDINGS Alignment: Normal. Skull base and vertebrae: No acute fracture. No primary bone lesion or focal pathologic process. Soft tissues and spinal  canal: No prevertebral fluid or swelling. No visible canal hematoma. BILATERAL carotid bifurcation calcification. Disc levels:  No traumatic or calcified disc herniation. Upper chest: Biapical scarring and emphysematous change, greater on the RIGHT, without progression from January 2020 CT chest. Other: None. IMPRESSION: 1. No skull fracture or intracranial hemorrhage. Large RIGHT parietal scalp hematoma. 2. No cervical spine fracture or traumatic subluxation.  Electronically Signed   By: Staci Righter M.D.   On: 06/26/2018 13:50   Dg Knee Right Port  Result Date: 06/26/2018 CLINICAL DATA:  Right knee pain after fall. EXAM: PORTABLE RIGHT KNEE - 1-2 VIEW COMPARISON:  None. FINDINGS: No evidence of fracture, dislocation, or joint effusion. No evidence of arthropathy or other focal bone abnormality. Vascular calcifications are noted. IMPRESSION: Negative. Electronically Signed   By: Marijo Conception, M.D.   On: 06/26/2018 16:50   Dg Femur, Min 2 Views Right  Result Date: 06/26/2018 CLINICAL DATA:  PER EMS, Pt lives ay [place] and fell in the bathroom and hit the back of her head. Pt also has lac to back of head . Pt reports pain to RT hip . EXAM: RIGHT FEMUR 2 VIEWS COMPARISON:  None. FINDINGS: Non comminuted fracture of the right femoral neck. No significant fracture displacement. There is valgus fracture angulation. No other fractures.  Hip and knee joints are normally aligned. Soft tissues are unremarkable. IMPRESSION: 1. Nondisplaced, non comminuted, valgus angulated fracture of the right femoral neck. 2. No other fractures.  No dislocation. Electronically Signed   By: Lajean Manes M.D.   On: 06/26/2018 14:54    Pending Labs Unresulted Labs (From admission, onward)    Start     Ordered   06/26/18 1552  Urinalysis, Routine w reflex microscopic  ONCE - STAT,   STAT     06/26/18 1551   06/26/18 1530  Protime-INR  Once,   STAT     06/26/18 1530          Vitals/Pain Today's Vitals    06/26/18 1751 06/26/18 1800 06/26/18 1815 06/26/18 2000  BP:  (!) 167/92 (!) 141/81 132/83  Pulse:  (!) 122 (!) 120 (!) 107  Resp:  19 (!) 22 20  Temp:      TempSrc:      SpO2:  96% 96% 98%  Weight:      Height:      PainSc: 8        Isolation Precautions No active isolations  Medications Medications  morphine CONCENTRATE 10 mg / 0.5 ml oral solution 10 mg (has no administration in time range)  traMADol (ULTRAM) tablet 50 mg (has no administration in time range)  atorvastatin (LIPITOR) tablet 20 mg (has no administration in time range)  losartan (COZAAR) tablet 100 mg (has no administration in time range)  LORazepam (ATIVAN) tablet 0.5 mg (has no administration in time range)  mirtazapine (REMERON) tablet 30 mg (has no administration in time range)  OLANZapine (ZYPREXA) tablet 7.5 mg (has no administration in time range)  traZODone (DESYREL) tablet 50 mg (has no administration in time range)  alum & mag hydroxide-simeth (MAALOX/MYLANTA) 200-200-20 MG/5ML suspension 30 mL (has no administration in time range)  hyoscyamine (LEVSIN, ANASPAZ) tablet 0.125 mg (has no administration in time range)  loperamide (IMODIUM) capsule 2 mg (has no administration in time range)  magnesium hydroxide (MILK OF MAGNESIA) suspension 30 mL (has no administration in time range)  polyethylene glycol (MIRALAX / GLYCOLAX) packet 17 g (has no administration in time range)  gabapentin (NEURONTIN) capsule 300 mg (has no administration in time range)  b complex vitamins tablet 1 tablet (has no administration in time range)  vitamin C (ASCORBIC ACID) tablet 500 mg (has no administration in time range)  guaiFENesin (ROBITUSSIN) 100 MG/5ML solution 200 mg (has no administration in time range)  ipratropium-albuterol (DUONEB) 0.5-2.5 (3) MG/3ML nebulizer solution 3 mL (has no administration  in time range)  acetaminophen (TYLENOL) tablet 650 mg (has no administration in time range)  fentaNYL (SUBLIMAZE) injection  25 mcg (25 mcg Intravenous Given 06/26/18 1314)  fentaNYL (SUBLIMAZE) injection 25 mcg (25 mcg Intravenous Given 06/26/18 1404)  Tdap (BOOSTRIX) injection 0.5 mL (0.5 mLs Intramuscular Given 06/26/18 1404)  acetaminophen (TYLENOL) tablet 1,000 mg (1,000 mg Oral Given 06/26/18 1702)  fentaNYL (SUBLIMAZE) injection 25 mcg (25 mcg Intravenous Given 06/26/18 1702)  sodium chloride 0.9 % bolus 500 mL (500 mLs Intravenous New Bag/Given 06/26/18 1816)    Mobility walks with device High fall risk   Focused Assessments pt has had fall.  High fall risk, hip fx and rib fx, coughing up phleg   R Recommendations: See Admitting Provider Note  Report given to:   Additional Notes:  From [facility], fell back and struck back of her head

## 2018-06-26 NOTE — ED Notes (Signed)
Unable to obtain urine via I&O, second RN to attempt I&O

## 2018-06-26 NOTE — H&P (Signed)
History and Physical    [name] EXN:[id number] DOB: 1945-05-11 DOA: 06/26/2018  PCP: Jeanette Caprice, PA-C  Patient coming from: [place] have personally briefly reviewed patient's old medical records in [place]  Chief Complaint: Pain to the right hip after fall in the shower this morning  HPI: [name] is a 73 y.o. female with medical history significant of COPD, depression, gastroesophageal reflux disease, hypertension, previous thrombotic stroke, and vascular dementia who presents to the emergency department today after a fall in the bathroom this morning.  Patient fell and hit the back of her head where she had a laceration and a hematoma.  She also is complaining of right hip pain.  After the mechanical fall she complained of an associated headache, she has a scalp laceration and pain in the right hip and thigh.  She denies any vision changes, nausea, neck pain, back pain, or other injuries.  Per EMS she is at her baseline mentally.  Patient resides at [facility] where she is on hospice with dermodesis and her primary care attending is Dr. Jeanette Caprice.  States that during her admission in January she had issues with urinary retention and a catheter needed to be placed.  In the emergency department patient saying she needs to urinate but cannot evacuate her bladder.  An in and out cath is being performed.  ED Course: X-rays performed found to have right rib fractures numbers 3, 4, 5, and 6.  Nondisplaced non-comminuted valgus angulated fracture of the right femoral neck, and a right parietal scalp hematoma, PDX consulted.  Hospitalist consulted for admission.  Review of Systems: As per HPI otherwise all other systems reviewed and  negative.   Past Medical History:  Diagnosis Date  . Carotid artery aneurysm ([place]) 03/04/2012  . COPD (chronic obstructive pulmonary disease) ([place])   . Depression   . DJD (degenerative joint disease)   . GERD  (gastroesophageal reflux disease)   . Hypertension   . Stroke [facility])    was on Plavix - not now  . Vascular dementia [facility])     Past Surgical History:  Procedure Laterality Date  . CATARACT EXTRACTION Bilateral   . COLONOSCOPY  2004  . HERNIA REPAIR Right 2014   inguinal  . KYPHOPLASTY N/A 10/28/2014   Procedure: KYPHOPLASTY;  Surgeon: Phylliss Bob, MD;  Location: [place];  Service: Orthopedics;  Laterality: N/A;  Lumbar 1 kyphoplasty  . MICROLARYNGOSCOPY WITH CO2 LASER AND EXCISION OF VOCAL CORD LESION N/A 06/05/2013   Procedure: MICROLARYNGOSCOPY WITH CO2 LASER AND EXCISION OF VOCAL CORD POLYPS;  Surgeon: Melida Quitter, MD;  Location: [facility];  Service: ENT;  Laterality: N/A;  . TONSILLECTOMY    . TUBAL LIGATION  30 years  . VIDEO BRONCHOSCOPY  04/24/2012   Procedure: VIDEO BRONCHOSCOPY WITHOUT FLUORO;  Surgeon: Rigoberto Noel, MD;  Location: [place];  Service: Cardiopulmonary;  Laterality: Bilateral;    Social History   Social History Narrative  . Not on file     reports that she has quit smoking. Her smoking use included cigarettes. She has a 7.50 pack-year smoking history. She has never used smokeless tobacco. She reports that she does not drink alcohol or use drugs.  Allergies  Allergen Reactions  . Amoxapine And Related Nausea And Vomiting  . Amoxicillin Itching    itching  . Ciprocinonide [Fluocinolone] Other (See Comments)    Pt unsure  . Ciprofloxacin Other (See Comments)    Pt unsure  .  Doxycycline Nausea And Vomiting  . Erythromycin Hives  . Sulfa Antibiotics Other (See Comments)    Unknown-reaction as a child  . Zofran [Ondansetron Hcl] Nausea And Vomiting    Family History  Problem Relation Age of Onset  . Breast cancer Mother   . Breast cancer Sister      Prior to Admission medications   Medication Sig Start Date End Date Taking? Authorizing Provider  acetaminophen (TYLENOL) 500 MG tablet Take 500 mg by mouth every 4 (four) hours as needed for mild  pain.   Yes [provider]  alum & mag hydroxide-simeth (Obetz) 200-200-20 MG/5ML suspension Take 30 mLs by mouth every 6 (six) hours as needed for indigestion or heartburn.   Yes [provider]  atorvastatin (LIPITOR) 20 MG tablet Take 1 tablet (20 mg total) by mouth daily at 6 PM. 02/29/16  Yes Reyne Dumas, MD  b complex vitamins tablet Take 1 tablet by mouth daily. 01/25/16  Yes Rolene Course, PA-C  clopidogrel (PLAVIX) 75 MG tablet Take 75 mg by mouth daily.   Yes [provider]  gabapentin (NEURONTIN) 300 MG capsule Take 300 mg by mouth at bedtime. 06/12/16  Yes [provider]  guaiFENesin (ROBITUSSIN) 100 MG/5ML SOLN Take 10 mLs by mouth every 6 (six) hours as needed for cough or to loosen phlegm.   Yes [provider]  hyoscyamine (LEVSIN, ANASPAZ) 0.125 MG tablet Take 0.125 mg by mouth See admin instructions. Take 1 tablet by mouth every 2 hours as needed excessive secretions   Yes [provider]  ipratropium-albuterol (DUONEB) 0.5-2.5 (3) MG/3ML SOLN Take 3 mLs by nebulization every 6 (six) hours as needed (wheezing/congestion).   Yes [provider]  loperamide (IMODIUM) 2 MG capsule Take 2 mg by mouth as needed for diarrhea or loose stools.   Yes [provider]  LORazepam (ATIVAN) 0.5 MG tablet Take 0.5 mg by mouth every 4 (four) hours as needed for anxiety.   Yes [provider]  losartan (COZAAR) 100 MG tablet Take 100 mg by mouth daily. 06/13/16  Yes [provider]  mirtazapine (REMERON) 30 MG tablet Take 30 mg by mouth at bedtime. 06/28/16  Yes [provider]  Morphine Sulfate (MORPHINE CONCENTRATE) 10 mg / 0.5 ml concentrated solution Take 10 mg by mouth every 2 (two) hours as needed for severe pain or shortness of breath.   Yes [provider]  OLANZapine (ZYPREXA) 7.5 MG tablet Take 7.5 mg by mouth 2 (two) times daily.    Yes [provider]  polyethylene glycol  (MIRALAX / GLYCOLAX) packet Take 17 g by mouth daily. Patient taking differently: Take 17 g by mouth daily as needed for mild constipation.  04/08/18  Yes Bonnielee Haff, MD  traMADol (ULTRAM) 50 MG tablet Take 1 tablet (50 mg total) by mouth 2 (two) times daily as needed. Patient taking differently: Take 50 mg by mouth 2 (two) times daily as needed for moderate pain.  04/07/18  Yes Bonnielee Haff, MD  traZODone (DESYREL) 50 MG tablet Take 50 mg by mouth at bedtime as needed for sleep.   Yes [provider]  trolamine salicylate (ASPERCREME) 10 % cream Apply 1 application topically 2 (two) times daily as needed for muscle pain.    Yes [provider]  vitamin C (ASCORBIC ACID) 500 MG tablet Take 500 mg by mouth daily.   Yes [provider]  magnesium hydroxide (MILK OF MAGNESIA) 400 MG/5ML suspension Take 30 mLs by  mouth at bedtime as needed for mild constipation.    [provider]  mometasone-formoterol (DULERA) 200-5 MCG/ACT AERO Inhale 1 puff into the lungs 2 (two) times daily. Patient not taking: Reported on 06/26/2018 04/07/18   Bonnielee Haff, MD  oxybutynin (DITROPAN) 5 MG tablet TAKE 1/2 TO 1 TABLET BY MOUTH THREE TIMES DAILY FOR BLADDER CONTROL Patient not taking: No sig reported 04/23/16   Unk Pinto, MD    Physical Exam:  Constitutional: NAD, calm, comfortable Vitals:   06/26/18 1615 06/26/18 1630 06/26/18 1715 06/26/18 1730  BP: (!) 150/93 (!) 164/95 (!) 155/96 (!) 147/89  Pulse: (!) 126 (!) 128 (!) 128 (!) 123  Resp: (!) 21 20 (!) 22 20  Temp:      TempSrc:      SpO2: 92% 91% 96% 94%  Weight:      Height:       Eyes: PERRL, lids and conjunctivae normal ENMT: Mucous membranes are moist. Posterior pharynx clear of any exudate or lesions.Normal dentition.  Neck: normal, supple, no masses, no thyromegaly Respiratory: clear to auscultation bilaterally, no wheezing, no crackles. Normal respiratory effort. No accessory muscle use.   Cardiovascular: Regular rate and rhythm, no murmurs / rubs / gallops. No extremity edema. 2+ pedal pulses. No carotid bruits.  Abdomen: no tenderness, no masses palpated. No hepatosplenomegaly. Bowel sounds positive.  Musculoskeletal: no clubbing / cyanosis. No joint deformity upper extremities. Good ROM of upper extremities, no contractures. Normal muscle tone.  Patient complains of pain with internal and external rotation of the right hip.  Holding her right hip and knee in a flexed position. Skin: no rashes, lesions, ulcers. No induration Neurologic: CN 2-12 grossly intact. Sensation intact, DTR normal. Strength 5/5 in upper extremities and left lower extremity.  Psychiatric: Follows simple commands is oriented to name location and weekday.  Mood is normal affect is not depressed   Labs on Admission: I have personally reviewed following labs and imaging studies  CBC: Recent Labs  Lab 06/26/18 1506  WBC 23.3*  NEUTROABS 18.7*  HGB 15.2*  HCT 46.1*  MCV 90.7  PLT 784   Basic Metabolic Panel: Recent Labs  Lab 06/26/18 1506  NA 136  K 3.9  CL 99  CO2 24  GLUCOSE 135*  BUN 7*  CREATININE 0.96  CALCIUM 9.3   GFR: Estimated Creatinine Clearance: 33.1 mL/min (by C-G formula based on SCr of 0.96 mg/dL).  Urine analysis:    Component Value Date/Time   COLORURINE YELLOW 04/03/2018 2034   APPEARANCEUR CLEAR 04/03/2018 2034   LABSPEC 1.013 04/03/2018 2034   PHURINE 5.0 04/03/2018 2034   GLUCOSEU NEGATIVE 04/03/2018 2034   HGBUR NEGATIVE 04/03/2018 2034   BILIRUBINUR NEGATIVE 04/03/2018 2034   [place] NEGATIVE 04/03/2018 2034   PROTEINUR NEGATIVE 04/03/2018 2034   UROBILINOGEN 0.2 10/26/2014 1503   NITRITE NEGATIVE 04/03/2018 2034   LEUKOCYTESUR NEGATIVE 04/03/2018 2034    Radiological Exams on Admission: Dg Chest 1 View  Result Date: 06/26/2018 CLINICAL DATA:  Fall. EXAM: CHEST  1 VIEW COMPARISON:  04/03/2018 FINDINGS: The heart size and mediastinal contours are  within normal limits. There is no evidence of pulmonary edema, consolidation, pneumothorax, nodule or pleural fluid. There appear to be new fractures of the right third, fourth, fifth and sixth ribs demonstrating minimal angulation/displacement. IMPRESSION: New fractures of the right third, fourth, fifth and sixth ribs demonstrating minimal angulation and displacement. No evidence of associated pneumothorax or pleural fluid. Electronically Signed   By: Jenness Corner.D.  On: 06/26/2018 16:57   Dg Pelvis 1-2 Views  Result Date: 06/26/2018 CLINICAL DATA:  PER EMS, Pt lives ay [place] and fell in the bathroom and hit the back of her head. Pt also has lac to back of head . Pt reports pain to RT hip . EXAM: PELVIS - 1-2 VIEW COMPARISON:  CT, 09/19/2009. FINDINGS: Fracture of the right femoral neck, subcapital to mid femoral neck, nondisplaced and non comminuted, but with valgus angulation. No other fractures. No bone lesions. Hip joints, SI joints and symphysis pubis are normally spaced and aligned. Skeletal structures are demineralized. Soft tissues are unremarkable. IMPRESSION: 1. Nondisplaced, non comminuted, valgus angulated fracture of the right femoral neck. 2. No other fractures.  No dislocation. Electronically Signed   By: Lajean Manes M.D.   On: 06/26/2018 14:55   Ct Head Wo Contrast  Result Date: 06/26/2018 CLINICAL DATA:  Head trauma, headache. Neck pain. EXAM: CT HEAD WITHOUT CONTRAST CT CERVICAL SPINE WITHOUT CONTRAST TECHNIQUE: Multidetector CT imaging of the head and cervical spine was performed following the standard protocol without intravenous contrast. Multiplanar CT image reconstructions of the cervical spine were also generated. COMPARISON:  04/02/2018 FINDINGS: CT HEAD FINDINGS Brain: No evidence of acute infarction, hemorrhage, hydrocephalus, extra-axial collection or mass lesion/mass effect. Generalized atrophy. Hypoattenuation of white matter, likely chronic microvascular  ischemic change. Chronic LEFT parietal infarct, significant volume loss. Vascular: Calcification of the cavernous internal carotid arteries consistent with cerebrovascular atherosclerotic disease. No signs of intracranial large vessel occlusion. Skull: Calvarium intact. Sinuses/Orbits: No layering sinus fluid. Negative orbits. Other: Large RIGHT parietal scalp hematoma. CT CERVICAL SPINE FINDINGS Alignment: Normal. Skull base and vertebrae: No acute fracture. No primary bone lesion or focal pathologic process. Soft tissues and spinal canal: No prevertebral fluid or swelling. No visible canal hematoma. BILATERAL carotid bifurcation calcification. Disc levels:  No traumatic or calcified disc herniation. Upper chest: Biapical scarring and emphysematous change, greater on the RIGHT, without progression from January 2020 CT chest. Other: None. IMPRESSION: 1. No skull fracture or intracranial hemorrhage. Large RIGHT parietal scalp hematoma. 2. No cervical spine fracture or traumatic subluxation. Electronically Signed   By: Staci Righter M.D.   On: 06/26/2018 13:50   Ct Cervical Spine Wo Contrast  Result Date: 06/26/2018 CLINICAL DATA:  Head trauma, headache. Neck pain. EXAM: CT HEAD WITHOUT CONTRAST CT CERVICAL SPINE WITHOUT CONTRAST TECHNIQUE: Multidetector CT imaging of the head and cervical spine was performed following the standard protocol without intravenous contrast. Multiplanar CT image reconstructions of the cervical spine were also generated. COMPARISON:  04/02/2018 FINDINGS: CT HEAD FINDINGS Brain: No evidence of acute infarction, hemorrhage, hydrocephalus, extra-axial collection or mass lesion/mass effect. Generalized atrophy. Hypoattenuation of white matter, likely chronic microvascular ischemic change. Chronic LEFT parietal infarct, significant volume loss. Vascular: Calcification of the cavernous internal carotid arteries consistent with cerebrovascular atherosclerotic disease. No signs of intracranial  large vessel occlusion. Skull: Calvarium intact. Sinuses/Orbits: No layering sinus fluid. Negative orbits. Other: Large RIGHT parietal scalp hematoma. CT CERVICAL SPINE FINDINGS Alignment: Normal. Skull base and vertebrae: No acute fracture. No primary bone lesion or focal pathologic process. Soft tissues and spinal canal: No prevertebral fluid or swelling. No visible canal hematoma. BILATERAL carotid bifurcation calcification. Disc levels:  No traumatic or calcified disc herniation. Upper chest: Biapical scarring and emphysematous change, greater on the RIGHT, without progression from January 2020 CT chest. Other: None. IMPRESSION: 1. No skull fracture or intracranial hemorrhage. Large RIGHT parietal scalp hematoma. 2. No cervical spine fracture or traumatic  subluxation. Electronically Signed   By: Staci Righter M.D.   On: 06/26/2018 13:50   Dg Knee Right Port  Result Date: 06/26/2018 CLINICAL DATA:  Right knee pain after fall. EXAM: PORTABLE RIGHT KNEE - 1-2 VIEW COMPARISON:  None. FINDINGS: No evidence of fracture, dislocation, or joint effusion. No evidence of arthropathy or other focal bone abnormality. Vascular calcifications are noted. IMPRESSION: Negative. Electronically Signed   By: Marijo Conception, M.D.   On: 06/26/2018 16:50   Dg Femur, Min 2 Views Right  Result Date: 06/26/2018 CLINICAL DATA:  PER EMS, Pt lives ay [place] and fell in the bathroom and hit the back of her head. Pt also has lac to back of head . Pt reports pain to RT hip . EXAM: RIGHT FEMUR 2 VIEWS COMPARISON:  None. FINDINGS: Non comminuted fracture of the right femoral neck. No significant fracture displacement. There is valgus fracture angulation. No other fractures.  Hip and knee joints are normally aligned. Soft tissues are unremarkable. IMPRESSION: 1. Nondisplaced, non comminuted, valgus angulated fracture of the right femoral neck. 2. No other fractures.  No dislocation. Electronically Signed   By: Lajean Manes M.D.    On: 06/26/2018 14:54    EKG: Is pending  Assessment/Plan Principal Problem:   Fracture of femoral neck, right, closed ([place]) Active Problems:   Multiple fractures of ribs, right side, initial encounter for closed fracture   Leukocytosis   Inadequate pain control   Hospice care patient   Hematoma of right parietal scalp   #1.  Fracture of right femoral neck closed: Orthopedic surgery has seen the patient plans for OR in the a.m.  Management per orthopedics.  2.  Multiple fractures of the ribs on the right side numbers 3, 4, 5, and 6.  Will start pain management with Tylenol around-the-clock.  Also some available stronger medication.  3.  Leukocytosis: Likely related to multiple fractures and demargination due to stress.  Will check UA to rule out UTI.  4.  Inadequate pain control: We will start continuous Tylenol and add as needed low-dose narcotic.  5.  Hospice care patient: She is followed by [facility]  hospice out of [place].  6.  Hematoma of the right parietal scalp: Noted will apply dressing  DVT prophylaxis: Subcu heparin Code Status: DO NOT RESUSCITATE except for the 24 hours around the time of surgery Family Communication: Poke with patient's daughter, [name], phone number [phone].  Updated her on her mother's condition.  Discussed mother's history and care with her.  Plan is to continue hospice at discharge. Disposition Plan: Back to [place] when stable Consults called: Orthopedic surgery Admission status: Inpatient   Lady Deutscher MD FACP [facility] Pager [phone]  How to contact the [facility] Attending or Consulting provider [place] or covering provider during after hours [place], for this patient?  1. Check the care team in [facility] and look for a) attending/consulting [facility] provider listed and b) the [facility] team listed 2. Log into [email or web address] and use [place]'s universal password to access. If you do not have the password, please contact  the hospital operator. 3. Locate the [facility] provider you are looking for under [facility] and page to a number that you can be directly reached. 4. If you still have difficulty reaching the provider, please page the [facility] (Director on Call) for the Hospitalists listed on amion for assistance.  If 7PM-7AM, please contact night-coverage [email or web address] Password [facility]  06/26/2018, 6:07 PM

## 2018-06-26 NOTE — ED Notes (Signed)
Paged admitting [facility] to [phone]

## 2018-06-26 NOTE — ED Notes (Signed)
Report given to [name] on 6N

## 2018-06-26 NOTE — H&P (View-Only) (Signed)
Reason for Consult:Right hip fx Referring Physician: R Sahiba Bolton is an 73 y.o. female.  HPI: [name] was at the [facility] when she lost her balance and fell in the bathroom. She landed on her right side and back where she hit the back of her head. She had immediate hip pain and could not get up. She was brought to the ED for evaluation where x-rays showed a right femoral neck fx and orthopedic surgery was consulted.  Past Medical History:  Diagnosis Date  . Carotid artery aneurysm ([place]) 03/04/2012  . COPD (chronic obstructive pulmonary disease) ([place])   . Depression   . DJD (degenerative joint disease)   . GERD (gastroesophageal reflux disease)   . Hypertension   . Stroke [facility])    was on Plavix - not now  . Vascular dementia [facility])     Past Surgical History:  Procedure Laterality Date  . CATARACT EXTRACTION Bilateral   . COLONOSCOPY  2004  . HERNIA REPAIR Right 2014   inguinal  . KYPHOPLASTY N/A 10/28/2014   Procedure: KYPHOPLASTY;  Surgeon: Phylliss Bob, MD;  Location: [place];  Service: Orthopedics;  Laterality: N/A;  Lumbar 1 kyphoplasty  . MICROLARYNGOSCOPY WITH CO2 LASER AND EXCISION OF VOCAL CORD LESION N/A 06/05/2013   Procedure: MICROLARYNGOSCOPY WITH CO2 LASER AND EXCISION OF VOCAL CORD POLYPS;  Surgeon: Melida Quitter, MD;  Location: [place];  Service: ENT;  Laterality: N/A;  . TONSILLECTOMY    . TUBAL LIGATION  30 years  . VIDEO BRONCHOSCOPY  04/24/2012   Procedure: VIDEO BRONCHOSCOPY WITHOUT FLUORO;  Surgeon: Rigoberto Noel, MD;  Location: [place];  Service: Cardiopulmonary;  Laterality: Bilateral;    Family History  Problem Relation Age of Onset  . Breast cancer Mother   . Breast cancer Sister     Social History:  reports that she has quit smoking. Her smoking use included cigarettes. She has a 7.50 pack-year smoking history. She has never used smokeless tobacco. She reports that she does not drink alcohol or use drugs.  Allergies:  Allergies   Allergen Reactions  . Amoxapine And Related Nausea And Vomiting  . Amoxicillin     itching  . Ciprocinonide [Fluocinolone]     Pt unsure  . Ciprofloxacin     Pt unsure  . Doxycycline Nausea And Vomiting  . Erythromycin Hives  . Sulfa Antibiotics Other (See Comments)    Unknown-reaction as a child  . Zofran [Ondansetron Hcl] Nausea And Vomiting    Medications: I have reviewed the patient's current medications.  Results for orders placed or performed during the hospital encounter of 06/26/18 (from the past 48 hour(s))  CBC with Differential     Status: Abnormal   Collection Time: 06/26/18  3:06 PM  Result Value Ref Range   WBC 23.3 (H) 4.0 - 10.5 K/uL   RBC 5.08 3.87 - 5.11 MIL/uL   Hemoglobin 15.2 (H) 12.0 - 15.0 g/dL   HCT 46.1 (H) 36.0 - 46.0 %   MCV 90.7 80.0 - 100.0 fL   MCH 29.9 26.0 - 34.0 pg   MCHC 33.0 30.0 - 36.0 g/dL   RDW 13.2 11.5 - 15.5 %   Platelets 398 150 - 400 K/uL   nRBC 0.0 0.0 - 0.2 %   Neutrophils Relative % 81 %   Neutro Abs 18.7 (H) 1.7 - 7.7 K/uL   Lymphocytes Relative 10 %   Lymphs Abs 2.4 0.7 - 4.0 K/uL   Monocytes Relative  7 %   Monocytes Absolute 1.7 (H) 0.1 - 1.0 K/uL   Eosinophils Relative 1 %   Eosinophils Absolute 0.1 0.0 - 0.5 K/uL   Basophils Relative 0 %   Basophils Absolute 0.1 0.0 - 0.1 K/uL   Immature Granulocytes 1 %   Abs Immature Granulocytes 0.26 (H) 0.00 - 0.07 K/uL    Comment: Performed at [facility] [address], [place], [place] [address]    Dg Pelvis 1-2 Views  Result Date: 06/26/2018 CLINICAL DATA:  PER EMS, Pt lives ay [place] and fell in the bathroom and hit the back of her head. Pt also has lac to back of head . Pt reports pain to RT hip . EXAM: PELVIS - 1-2 VIEW COMPARISON:  CT, 09/19/2009. FINDINGS: Fracture of the right femoral neck, subcapital to mid femoral neck, nondisplaced and non comminuted, but with valgus angulation. No other fractures. No bone lesions. Hip joints, SI joints and symphysis  pubis are normally spaced and aligned. Skeletal structures are demineralized. Soft tissues are unremarkable. IMPRESSION: 1. Nondisplaced, non comminuted, valgus angulated fracture of the right femoral neck. 2. No other fractures.  No dislocation. Electronically Signed   By: Lajean Manes M.D.   On: 06/26/2018 14:55   Ct Head Wo Contrast  Result Date: 06/26/2018 CLINICAL DATA:  Head trauma, headache. Neck pain. EXAM: CT HEAD WITHOUT CONTRAST CT CERVICAL SPINE WITHOUT CONTRAST TECHNIQUE: Multidetector CT imaging of the head and cervical spine was performed following the standard protocol without intravenous contrast. Multiplanar CT image reconstructions of the cervical spine were also generated. COMPARISON:  04/02/2018 FINDINGS: CT HEAD FINDINGS Brain: No evidence of acute infarction, hemorrhage, hydrocephalus, extra-axial collection or mass lesion/mass effect. Generalized atrophy. Hypoattenuation of white matter, likely chronic microvascular ischemic change. Chronic LEFT parietal infarct, significant volume loss. Vascular: Calcification of the cavernous internal carotid arteries consistent with cerebrovascular atherosclerotic disease. No signs of intracranial large vessel occlusion. Skull: Calvarium intact. Sinuses/Orbits: No layering sinus fluid. Negative orbits. Other: Large RIGHT parietal scalp hematoma. CT CERVICAL SPINE FINDINGS Alignment: Normal. Skull base and vertebrae: No acute fracture. No primary bone lesion or focal pathologic process. Soft tissues and spinal canal: No prevertebral fluid or swelling. No visible canal hematoma. BILATERAL carotid bifurcation calcification. Disc levels:  No traumatic or calcified disc herniation. Upper chest: Biapical scarring and emphysematous change, greater on the RIGHT, without progression from January 2020 CT chest. Other: None. IMPRESSION: 1. No skull fracture or intracranial hemorrhage. Large RIGHT parietal scalp hematoma. 2. No cervical spine fracture or traumatic  subluxation. Electronically Signed   By: Staci Righter M.D.   On: 06/26/2018 13:50   Ct Cervical Spine Wo Contrast  Result Date: 06/26/2018 CLINICAL DATA:  Head trauma, headache. Neck pain. EXAM: CT HEAD WITHOUT CONTRAST CT CERVICAL SPINE WITHOUT CONTRAST TECHNIQUE: Multidetector CT imaging of the head and cervical spine was performed following the standard protocol without intravenous contrast. Multiplanar CT image reconstructions of the cervical spine were also generated. COMPARISON:  04/02/2018 FINDINGS: CT HEAD FINDINGS Brain: No evidence of acute infarction, hemorrhage, hydrocephalus, extra-axial collection or mass lesion/mass effect. Generalized atrophy. Hypoattenuation of white matter, likely chronic microvascular ischemic change. Chronic LEFT parietal infarct, significant volume loss. Vascular: Calcification of the cavernous internal carotid arteries consistent with cerebrovascular atherosclerotic disease. No signs of intracranial large vessel occlusion. Skull: Calvarium intact. Sinuses/Orbits: No layering sinus fluid. Negative orbits. Other: Large RIGHT parietal scalp hematoma. CT CERVICAL SPINE FINDINGS Alignment: Normal. Skull base and vertebrae: No acute fracture. No  primary bone lesion or focal pathologic process. Soft tissues and spinal canal: No prevertebral fluid or swelling. No visible canal hematoma. BILATERAL carotid bifurcation calcification. Disc levels:  No traumatic or calcified disc herniation. Upper chest: Biapical scarring and emphysematous change, greater on the RIGHT, without progression from January 2020 CT chest. Other: None. IMPRESSION: 1. No skull fracture or intracranial hemorrhage. Large RIGHT parietal scalp hematoma. 2. No cervical spine fracture or traumatic subluxation. Electronically Signed   By: Staci Righter M.D.   On: 06/26/2018 13:50   Dg Femur, Min 2 Views Right  Result Date: 06/26/2018 CLINICAL DATA:  PER EMS, Pt lives ay [place] and fell in the bathroom and  hit the back of her head. Pt also has lac to back of head . Pt reports pain to RT hip . EXAM: RIGHT FEMUR 2 VIEWS COMPARISON:  None. FINDINGS: Non comminuted fracture of the right femoral neck. No significant fracture displacement. There is valgus fracture angulation. No other fractures.  Hip and knee joints are normally aligned. Soft tissues are unremarkable. IMPRESSION: 1. Nondisplaced, non comminuted, valgus angulated fracture of the right femoral neck. 2. No other fractures.  No dislocation. Electronically Signed   By: Lajean Manes M.D.   On: 06/26/2018 14:54    Review of Systems  Constitutional: Negative for weight loss.  HENT: Negative for ear discharge, ear pain, hearing loss and tinnitus.   Eyes: Negative for blurred vision, double vision, photophobia and pain.  Respiratory: Negative for cough, sputum production and shortness of breath.   Cardiovascular: Negative for chest pain.  Gastrointestinal: Negative for abdominal pain, nausea and vomiting.  Genitourinary: Negative for dysuria, flank pain, frequency and urgency.  Musculoskeletal: Positive for joint pain (Right hip). Negative for back pain, falls, myalgias and neck pain.  Neurological: Negative for dizziness, tingling, sensory change, focal weakness, loss of consciousness and headaches.  Endo/Heme/Allergies: Does not bruise/bleed easily.  Psychiatric/Behavioral: Negative for depression, memory loss and substance abuse. The patient is not nervous/anxious.    Blood pressure 138/88, pulse (!) 121, resp. rate 16, height 5\' 2"  (1.575 m), weight 40.8 kg, SpO2 100 %. Physical Exam  Constitutional: She appears well-developed and well-nourished. No distress.  HENT:  Head: Normocephalic and atraumatic.  Eyes: Conjunctivae are normal. Right eye exhibits no discharge. Left eye exhibits no discharge. No scleral icterus.  Neck: Normal range of motion.  Cardiovascular: Normal rate and regular rhythm.  Respiratory: Effort normal. No respiratory  distress.  Musculoskeletal:     Comments: RLE No traumatic wounds, ecchymosis, or rash  TTP hip, knee  No knee or ankle effusion  Knee stable to varus/ valgus and anterior/posterior stress  Sens DPN, SPN, TN intact  Motor EHL, ext, flex, evers 5/5  DP 1+, PT 0, No significant edema  Neurological: She is alert.  Skin: Skin is warm and dry. She is not diaphoretic.  Psychiatric: She has a normal mood and affect. Her behavior is normal.    Assessment/Plan: Right hip fx -- Will need THA, likely tomorrow. IM to admit and clear for surgery. Appreciate their input. Multiple medical problems including COPD, GERD, hypertension, stroke, and vascular dementia -- per primary service    Lisette Abu, PA-C Orthopedic Surgery [phone] 06/26/2018, 3:30 PM

## 2018-06-26 NOTE — ED Provider Notes (Signed)
[facility] EMERGENCY DEPARTMENT Provider Note   CSN: [id number] Arrival date & time: 06/26/18  1236    History   Chief Complaint Chief Complaint  Patient presents with   Fall    HPI [name] is a 73 y.o. female with past medical history of COPD, GERD, hypertension, stroke, vascular dementia, presenting to the emergency department from [place] after mechanical fall.  Patient states states she was in the shower, slipped, and hit the back of her head.  She has had associated headache, scalp laceration, and pain to her right hip and thigh.  Denies vision changes, nausea, neck pain, back pain, or other injuries.  Per EMS, she is currently at her mental baseline.     The history is provided by the patient.    Past Medical History:  Diagnosis Date   Carotid artery aneurysm ([place]) 03/04/2012   COPD (chronic obstructive pulmonary disease) ([name])    Depression    DJD (degenerative joint disease)    GERD (gastroesophageal reflux disease)    Hypertension    Stroke [facility])    was on Plavix - not now   Vascular dementia [facility])     Patient Active Problem List   Diagnosis Date Noted   Multiple fractures of ribs, right side, initial encounter for closed fracture 06/26/2018   Fracture of femoral neck, right, closed ([place]) 06/26/2018   Hematoma of right parietal scalp 06/26/2018   Leukocytosis 06/26/2018   Inadequate pain control 06/26/2018   AKI (acute kidney injury) ([place]) 04/04/2018   Hypoxia 04/04/2018   Probable sepsis 04/04/2018   Vascular dementia without behavioral disturbance ([place]) 06/30/2016   Disease ruled out after examination    Aphasia 05/15/2016   Atherosclerosis of left carotid artery    Cerebral embolism with cerebral infarction 02/27/2016   Acute encephalopathy 02/26/2016   Osteoporosis 07/04/2015   Impaired cognition 03/30/2014   Vitamin D deficiency 08/18/2013   Hyperlipidemia 08/18/2013   Prediabetes  08/18/2013   Hospice care patient 08/18/2013   CKD (chronic kidney disease) stage 3, GFR 30-59 ml/min ([name]) 07/01/2013   Anemia of chronic disease 07/01/2013   Depression, major, in remission ([place]) 06/30/2013   Essential hypertension 02/09/2013   COPD GOLD III 03/28/2012   Vocal cord dysfunction 03/28/2012   Carotid artery aneurysm ([place]) 03/04/2012   Smoker 03/04/2012    Past Surgical History:  Procedure Laterality Date   CATARACT EXTRACTION Bilateral    COLONOSCOPY  2004   HERNIA REPAIR Right 2014   inguinal   KYPHOPLASTY N/A 10/28/2014   Procedure: KYPHOPLASTY;  Surgeon: Phylliss Bob, MD;  Location: [place];  Service: Orthopedics;  Laterality: N/A;  Lumbar 1 kyphoplasty   MICROLARYNGOSCOPY WITH CO2 LASER AND EXCISION OF VOCAL CORD LESION N/A 06/05/2013   Procedure: MICROLARYNGOSCOPY WITH CO2 LASER AND EXCISION OF VOCAL CORD POLYPS;  Surgeon: Melida Quitter, MD;  Location: [place];  Service: ENT;  Laterality: N/A;   TONSILLECTOMY     TUBAL LIGATION  30 years   VIDEO BRONCHOSCOPY  04/24/2012   Procedure: VIDEO BRONCHOSCOPY WITHOUT FLUORO;  Surgeon: Rigoberto Noel, MD;  Location: [place];  Service: Cardiopulmonary;  Laterality: Bilateral;     OB History   No obstetric history on file.      Home Medications    Prior to Admission medications   Medication Sig Start Date End Date Taking? Authorizing Provider  acetaminophen (TYLENOL) 500 MG tablet Take 500 mg by mouth every 4 (four) hours as needed for mild pain.  Yes [provider]  alum & mag hydroxide-simeth ([place]) 200-200-20 MG/5ML suspension Take 30 mLs by mouth every 6 (six) hours as needed for indigestion or heartburn.   Yes [provider]  atorvastatin (LIPITOR) 20 MG tablet Take 1 tablet (20 mg total) by mouth daily at 6 PM. 02/29/16  Yes Reyne Dumas, MD  b complex vitamins tablet Take 1 tablet by mouth daily. 01/25/16  Yes Rolene Course, PA-C  clopidogrel (PLAVIX) 75 MG tablet Take 75  mg by mouth daily.   Yes [provider]  gabapentin (NEURONTIN) 300 MG capsule Take 300 mg by mouth at bedtime. 06/12/16  Yes [provider]  guaiFENesin (ROBITUSSIN) 100 MG/5ML SOLN Take 10 mLs by mouth every 6 (six) hours as needed for cough or to loosen phlegm.   Yes [provider]  hyoscyamine (LEVSIN, ANASPAZ) 0.125 MG tablet Take 0.125 mg by mouth See admin instructions. Take 1 tablet by mouth every 2 hours as needed excessive secretions   Yes [provider]  ipratropium-albuterol (DUONEB) 0.5-2.5 (3) MG/3ML SOLN Take 3 mLs by nebulization every 6 (six) hours as needed (wheezing/congestion).   Yes [provider]  loperamide (IMODIUM) 2 MG capsule Take 2 mg by mouth as needed for diarrhea or loose stools.   Yes [provider]  LORazepam (ATIVAN) 0.5 MG tablet Take 0.5 mg by mouth every 4 (four) hours as needed for anxiety.   Yes [provider]  losartan (COZAAR) 100 MG tablet Take 100 mg by mouth daily. 06/13/16  Yes [provider]  mirtazapine (REMERON) 30 MG tablet Take 30 mg by mouth at bedtime. 06/28/16  Yes [provider]  Morphine Sulfate (MORPHINE CONCENTRATE) 10 mg / 0.5 ml concentrated solution Take 10 mg by mouth every 2 (two) hours as needed for severe pain or shortness of breath.   Yes [provider]  OLANZapine (ZYPREXA) 7.5 MG tablet Take 7.5 mg by mouth 2 (two) times daily.    Yes [provider]  polyethylene glycol (MIRALAX / GLYCOLAX) packet Take 17 g by mouth daily. Patient taking differently: Take 17 g by mouth daily as needed for mild constipation.  04/08/18  Yes Bonnielee Haff, MD  traMADol (ULTRAM) 50 MG tablet Take 1 tablet (50 mg total) by mouth 2 (two) times daily as needed. Patient taking differently: Take 50 mg by mouth 2 (two) times daily as needed for moderate pain.  04/07/18  Yes Bonnielee Haff, MD  traZODone (DESYREL) 50 MG tablet Take 50 mg by mouth at bedtime  as needed for sleep.   Yes [provider]  trolamine salicylate (ASPERCREME) 10 % cream Apply 1 application topically 2 (two) times daily as needed for muscle pain.    Yes [provider]  vitamin C (ASCORBIC ACID) 500 MG tablet Take 500 mg by mouth daily.   Yes [provider]  magnesium hydroxide (MILK OF MAGNESIA) 400 MG/5ML suspension Take 30 mLs by mouth at bedtime as needed for mild constipation.    [provider]    Family History Family History  Problem Relation Age of Onset   Breast cancer Mother    Breast cancer Sister     Social History Social History   Tobacco Use   Smoking status: Former Smoker    Packs/day: 0.25    Years: 30.00    Pack years: 7.50    Types: Cigarettes   Smokeless tobacco: Never Used  Substance Use Topics   Alcohol use: No  Drug use: No     Allergies   Amoxapine and related; Amoxicillin; Ciprocinonide [fluocinolone]; Ciprofloxacin; Doxycycline; Erythromycin; Sulfa antibiotics; and Zofran [ondansetron hcl]   Review of Systems Review of Systems  Eyes: Negative for visual disturbance.  Gastrointestinal: Negative for nausea.  Musculoskeletal: Positive for arthralgias and myalgias. Negative for back pain and neck pain.  Neurological: Positive for headaches. Negative for syncope.  All other systems reviewed and are negative.    Physical Exam Updated Vital Signs BP (!) 141/81    Pulse (!) 120    Temp 98.7 F (37.1 C) (Oral)    Resp (!) 22    Ht 5\' 2"  (1.575 m)    Wt 40.8 kg    SpO2 96%    BMI 16.46 kg/m   Physical Exam Vitals signs and nursing note reviewed.  Constitutional:      General: She is not in acute distress.    Appearance: She is well-developed. She is not ill-appearing.  HENT:     Head: Normocephalic.     Comments: Large hematoma to right posterior scalp.  Small 1cm nonbleeding laceration present. Eyes:     Extraocular Movements: Extraocular movements intact.      Conjunctiva/sclera: Conjunctivae normal.     Pupils: Pupils are equal, round, and reactive to light.  Neck:     Comments: C-collar in place per EMS.  No pain with palpation of the spine or paraspinal musculature.  No bony step-offs or gross deformities. Cardiovascular:     Rate and Rhythm: Regular rhythm. Tachycardia present.  Pulmonary:     Effort: Pulmonary effort is normal. No respiratory distress.     Breath sounds: Normal breath sounds.  Abdominal:     General: Bowel sounds are normal. There is no distension.     Palpations: Abdomen is soft.     Tenderness: There is no abdominal tenderness. There is no guarding.  Musculoskeletal:     Comments: Patient is holding right hip and knee in a flexed position.  Pain with palpation of the thigh.  Pain with internal and external rotation of the hip.  Pelvis is stable.  Right lower leg is atraumatic and without tenderness.  Remainder of extremities are atraumatic and without tenderness.  Normal range of motion.  Skin:    General: Skin is warm.  Neurological:     Mental Status: She is alert.     Comments: Patient is a alert and orientated to person, place, and day of the week. Patient is intermittently able to follow simple commands.  Equal grip strength bilateral upper extremities.  Normal sensation to all extremities.  Cranial nerves grossly intact  Psychiatric:        Behavior: Behavior normal.      ED Treatments / Results  Labs (all labs ordered are listed, but only abnormal results are displayed) Labs Reviewed  BASIC METABOLIC PANEL - Abnormal; Notable for the following components:      Result Value   Glucose, Bld 135 (*)    BUN 7 (*)    GFR calc non Af Amer 58 (*)    All other components within normal limits  CBC WITH DIFFERENTIAL/PLATELET - Abnormal; Notable for the following components:   WBC 23.3 (*)    Hemoglobin 15.2 (*)    HCT 46.1 (*)    Neutro Abs 18.7 (*)    Monocytes Absolute 1.7 (*)    Abs Immature Granulocytes  0.26 (*)    All other components within normal limits  PROTIME-INR  URINALYSIS,  ROUTINE W REFLEX MICROSCOPIC  TYPE AND SCREEN  ABO/RH    EKG None  Radiology Dg Chest 1 View  Result Date: 06/26/2018 CLINICAL DATA:  Fall. EXAM: CHEST  1 VIEW COMPARISON:  04/03/2018 FINDINGS: The heart size and mediastinal contours are within normal limits. There is no evidence of pulmonary edema, consolidation, pneumothorax, nodule or pleural fluid. There appear to be new fractures of the right third, fourth, fifth and sixth ribs demonstrating minimal angulation/displacement. IMPRESSION: New fractures of the right third, fourth, fifth and sixth ribs demonstrating minimal angulation and displacement. No evidence of associated pneumothorax or pleural fluid. Electronically Signed   By: Aletta Edouard M.D.   On: 06/26/2018 16:57   Dg Pelvis 1-2 Views  Result Date: 06/26/2018 CLINICAL DATA:  PER EMS, Pt lives ay [place] and fell in the bathroom and hit the back of her head. Pt also has lac to back of head . Pt reports pain to RT hip . EXAM: PELVIS - 1-2 VIEW COMPARISON:  CT, 09/19/2009. FINDINGS: Fracture of the right femoral neck, subcapital to mid femoral neck, nondisplaced and non comminuted, but with valgus angulation. No other fractures. No bone lesions. Hip joints, SI joints and symphysis pubis are normally spaced and aligned. Skeletal structures are demineralized. Soft tissues are unremarkable. IMPRESSION: 1. Nondisplaced, non comminuted, valgus angulated fracture of the right femoral neck. 2. No other fractures.  No dislocation. Electronically Signed   By: Lajean Manes M.D.   On: 06/26/2018 14:55   Ct Head Wo Contrast  Result Date: 06/26/2018 CLINICAL DATA:  Head trauma, headache. Neck pain. EXAM: CT HEAD WITHOUT CONTRAST CT CERVICAL SPINE WITHOUT CONTRAST TECHNIQUE: Multidetector CT imaging of the head and cervical spine was performed following the standard protocol without intravenous contrast.  Multiplanar CT image reconstructions of the cervical spine were also generated. COMPARISON:  04/02/2018 FINDINGS: CT HEAD FINDINGS Brain: No evidence of acute infarction, hemorrhage, hydrocephalus, extra-axial collection or mass lesion/mass effect. Generalized atrophy. Hypoattenuation of white matter, likely chronic microvascular ischemic change. Chronic LEFT parietal infarct, significant volume loss. Vascular: Calcification of the cavernous internal carotid arteries consistent with cerebrovascular atherosclerotic disease. No signs of intracranial large vessel occlusion. Skull: Calvarium intact. Sinuses/Orbits: No layering sinus fluid. Negative orbits. Other: Large RIGHT parietal scalp hematoma. CT CERVICAL SPINE FINDINGS Alignment: Normal. Skull base and vertebrae: No acute fracture. No primary bone lesion or focal pathologic process. Soft tissues and spinal canal: No prevertebral fluid or swelling. No visible canal hematoma. BILATERAL carotid bifurcation calcification. Disc levels:  No traumatic or calcified disc herniation. Upper chest: Biapical scarring and emphysematous change, greater on the RIGHT, without progression from January 2020 CT chest. Other: None. IMPRESSION: 1. No skull fracture or intracranial hemorrhage. Large RIGHT parietal scalp hematoma. 2. No cervical spine fracture or traumatic subluxation. Electronically Signed   By: Staci Righter M.D.   On: 06/26/2018 13:50   Ct Cervical Spine Wo Contrast  Result Date: 06/26/2018 CLINICAL DATA:  Head trauma, headache. Neck pain. EXAM: CT HEAD WITHOUT CONTRAST CT CERVICAL SPINE WITHOUT CONTRAST TECHNIQUE: Multidetector CT imaging of the head and cervical spine was performed following the standard protocol without intravenous contrast. Multiplanar CT image reconstructions of the cervical spine were also generated. COMPARISON:  04/02/2018 FINDINGS: CT HEAD FINDINGS Brain: No evidence of acute infarction, hemorrhage, hydrocephalus, extra-axial collection or  mass lesion/mass effect. Generalized atrophy. Hypoattenuation of white matter, likely chronic microvascular ischemic change. Chronic LEFT parietal infarct, significant volume loss. Vascular: Calcification of the cavernous internal carotid arteries consistent  with cerebrovascular atherosclerotic disease. No signs of intracranial large vessel occlusion. Skull: Calvarium intact. Sinuses/Orbits: No layering sinus fluid. Negative orbits. Other: Large RIGHT parietal scalp hematoma. CT CERVICAL SPINE FINDINGS Alignment: Normal. Skull base and vertebrae: No acute fracture. No primary bone lesion or focal pathologic process. Soft tissues and spinal canal: No prevertebral fluid or swelling. No visible canal hematoma. BILATERAL carotid bifurcation calcification. Disc levels:  No traumatic or calcified disc herniation. Upper chest: Biapical scarring and emphysematous change, greater on the RIGHT, without progression from January 2020 CT chest. Other: None. IMPRESSION: 1. No skull fracture or intracranial hemorrhage. Large RIGHT parietal scalp hematoma. 2. No cervical spine fracture or traumatic subluxation. Electronically Signed   By: Staci Righter M.D.   On: 06/26/2018 13:50   Dg Knee Right Port  Result Date: 06/26/2018 CLINICAL DATA:  Right knee pain after fall. EXAM: PORTABLE RIGHT KNEE - 1-2 VIEW COMPARISON:  None. FINDINGS: No evidence of fracture, dislocation, or joint effusion. No evidence of arthropathy or other focal bone abnormality. Vascular calcifications are noted. IMPRESSION: Negative. Electronically Signed   By: Marijo Conception, M.D.   On: 06/26/2018 16:50   Dg Femur, Min 2 Views Right  Result Date: 06/26/2018 CLINICAL DATA:  PER EMS, Pt lives ay [place] and fell in the bathroom and hit the back of her head. Pt also has lac to back of head . Pt reports pain to RT hip . EXAM: RIGHT FEMUR 2 VIEWS COMPARISON:  None. FINDINGS: Non comminuted fracture of the right femoral neck. No significant fracture  displacement. There is valgus fracture angulation. No other fractures.  Hip and knee joints are normally aligned. Soft tissues are unremarkable. IMPRESSION: 1. Nondisplaced, non comminuted, valgus angulated fracture of the right femoral neck. 2. No other fractures.  No dislocation. Electronically Signed   By: Lajean Manes M.D.   On: 06/26/2018 14:54    Procedures .Marland KitchenLaceration Repair Date/Time: 06/26/2018 3:47 PM Performed by: Emily Massar, [place] N, PA-C Authorized by: Belvie Iribe, [place] N, PA-C   Consent:    Consent obtained:  Verbal   Consent given by:  Patient   Risks discussed:  Infection and pain   Alternatives discussed:  No treatment Anesthesia (see MAR for exact dosages):    Anesthesia method:  None Laceration details:    Location:  Scalp   Scalp location:  Occipital   Length (cm):  1 Repair type:    Repair type:  Simple Pre-procedure details:    Preparation:  Patient was prepped and draped in usual sterile fashion Exploration:    Hemostasis achieved with:  Direct pressure   Wound exploration: entire depth of wound probed and visualized     Wound extent: no foreign bodies/material noted and no underlying fracture noted     Contaminated: no   Treatment:    Area cleansed with:  Saline   Amount of cleaning:  Standard Skin repair:    Repair method:  Tissue adhesive (Dermabond) Approximation:    Approximation:  Close Post-procedure details:    Dressing:  Open (no dressing)   Patient tolerance of procedure:  Tolerated well, no immediate complications   (including critical care time) CRITICAL CARE Performed by: [place] N Gauge Winski   Total critical care time: 60 minutes  Critical care time was exclusive of separately billable procedures and treating other patients.  Critical care was necessary to treat or prevent imminent or life-threatening deterioration.  Critical care was time spent personally by me on the following activities: development of treatment plan  with patient  and/or surrogate as well as nursing, discussions with consultants, evaluation of patient's response to treatment, examination of patient, obtaining history from patient or surrogate, ordering and performing treatments and interventions, ordering and review of laboratory studies, ordering and review of radiographic studies, pulse oximetry and re-evaluation of patient's condition.   Medications Ordered in ED Medications  morphine CONCENTRATE 10 mg / 0.5 ml oral solution 10 mg (has no administration in time range)  traMADol (ULTRAM) tablet 50 mg (has no administration in time range)  atorvastatin (LIPITOR) tablet 20 mg (has no administration in time range)  losartan (COZAAR) tablet 100 mg (has no administration in time range)  LORazepam (ATIVAN) tablet 0.5 mg (has no administration in time range)  mirtazapine (REMERON) tablet 30 mg (has no administration in time range)  OLANZapine (ZYPREXA) tablet 7.5 mg (has no administration in time range)  traZODone (DESYREL) tablet 50 mg (has no administration in time range)  alum & mag hydroxide-simeth (MAALOX/MYLANTA) 200-200-20 MG/5ML suspension 30 mL (has no administration in time range)  hyoscyamine (LEVSIN, ANASPAZ) tablet 0.125 mg (has no administration in time range)  loperamide (IMODIUM) capsule 2 mg (has no administration in time range)  magnesium hydroxide (MILK OF MAGNESIA) suspension 30 mL (has no administration in time range)  polyethylene glycol (MIRALAX / GLYCOLAX) packet 17 g (has no administration in time range)  gabapentin (NEURONTIN) capsule 300 mg (has no administration in time range)  b complex vitamins tablet 1 tablet (has no administration in time range)  vitamin C (ASCORBIC ACID) tablet 500 mg (has no administration in time range)  guaiFENesin (ROBITUSSIN) 100 MG/5ML solution 200 mg (has no administration in time range)  ipratropium-albuterol (DUONEB) 0.5-2.5 (3) MG/3ML nebulizer solution 3 mL (has no administration in time range)    acetaminophen (TYLENOL) tablet 650 mg (has no administration in time range)  fentaNYL (SUBLIMAZE) injection 25 mcg (25 mcg Intravenous Given 06/26/18 1314)  fentaNYL (SUBLIMAZE) injection 25 mcg (25 mcg Intravenous Given 06/26/18 1404)  Tdap (BOOSTRIX) injection 0.5 mL (0.5 mLs Intramuscular Given 06/26/18 1404)  acetaminophen (TYLENOL) tablet 1,000 mg (1,000 mg Oral Given 06/26/18 1702)  fentaNYL (SUBLIMAZE) injection 25 mcg (25 mcg Intravenous Given 06/26/18 1702)  sodium chloride 0.9 % bolus 500 mL (500 mLs Intravenous New Bag/Given 06/26/18 1816)     Initial Impression / Assessment and Plan / ED Course  I have reviewed the triage vital signs and the nursing notes.  Pertinent labs & imaging results that were available during my care of the patient were reviewed by me and considered in my medical decision making (see chart for details).  Clinical Course as of Jun 25 1856  Thu Jun 26, 2018  1600 Pt re-evalauted. Pain has returned. Discussed xray results and plan. Pt agreeable   [JR]  1700 CXR with multiple rib fractures. Re-evaluated pt and she confirms the she has no chest or back pain. She is not more SOB than baseline. No pain with breathing. O2 sat 98%. No tenderness to chest wall. This was discussed with Dr. Gilford Raid. Will proceed with admission.   [JR]  [place] patient's daughter, [name], and provided updates. Reports she is currently on hospice care for failure to thrive.   [JR]  [place] Dr. Evangeline Gula accepting admission.   [JR]    Clinical Course User Index [JR] Christabella Alvira, [place] N, PA-C       Patient presenting from nursing home after mechanical fall in the shower.  She hit the back of her head has associated scalp  laceration.  She also has right hip pain.  She is at her mental baseline per EMS.  Not on anticoagulation.  On exam, she is alert and answering questions appropriately.  Scalp hematoma and laceration present, not actively bleeding.  Patient is holding right leg in a  flexed position and complaining of pain to the proximal aspect of her thigh.  CT head and C-spine are negative for acute injury.  There is a nondisplaced angulated right femoral neck fracture.  Pain managed with fentanyl.  Laceration closed with Dermabond.  Consult placed to Ortho PA, Hilbert Odor, who evaluated patient.  Patient will likely undergo ORIF tomorrow. CBC with significant leukocytosis. CXR and urine ordered to evaluate for other possible causes. CXR with incidental finding of right third through sixth rib fractures, no pneumothorax.  Patient reevaluated following this result, without chest pain or shortness of breath.  O2 saturation is 96% on 4 L (this is her baseline oxygen requirement).  Patient's daughter updated, reports she is on hospice care.  Agreeable to admission at this time.  Admitted to hospitalist service.  Discussed patient with Dr. Vanita Panda, who agrees with workup and care plan.  The patient appears reasonably stabilized for admission considering the current resources, flow, and capabilities available in the ED at this time, and I doubt any other [facility] requiring further screening and/or treatment in the ED prior to admission.   Final Clinical Impressions(s) / ED Diagnoses   Final diagnoses:  Closed fracture of neck of right femur, initial encounter ([place])  Fall at nursing home, initial encounter  Scalp laceration, initial encounter  Multiple rib fractures involving four or more ribs    ED Discharge Orders    None       Linn Goetze, [place] N, PA-C 06/26/18 Vernon Prey, MD 06/27/18 1443

## 2018-06-26 NOTE — ED Notes (Signed)
Bladder skin pt. Pt had a volume of 253. Nurse notified

## 2018-06-26 NOTE — ED Triage Notes (Signed)
PER EMS ,Pt lives ay [place] and fell in the bathroom and hit the back of her head. Pt also has lac to back of head . Pt reports pain to RT hip .

## 2018-06-26 NOTE — Consult Note (Addendum)
Reason for Consult:Right hip fx Referring Physician: R [name] is an 73 y.o. female.  HPI: [name] was at the SNF where she resides when she lost her balance and fell in the bathroom. She landed on her right side and back where she hit the back of her head. She had immediate hip pain and could not get up. She was brought to the ED for evaluation where x-rays showed a right femoral neck fx and orthopedic surgery was consulted.  Past Medical History:  Diagnosis Date  . Carotid artery aneurysm ([place]) 03/04/2012  . COPD (chronic obstructive pulmonary disease) ([place])   . Depression   . DJD (degenerative joint disease)   . GERD (gastroesophageal reflux disease)   . Hypertension   . Stroke [facility])    was on Plavix - not now  . Vascular dementia [facility])     Past Surgical History:  Procedure Laterality Date  . CATARACT EXTRACTION Bilateral   . COLONOSCOPY  2004  . HERNIA REPAIR Right 2014   inguinal  . KYPHOPLASTY N/A 10/28/2014   Procedure: KYPHOPLASTY;  Surgeon: Phylliss Bob, MD;  Location: [place];  Service: Orthopedics;  Laterality: N/A;  Lumbar 1 kyphoplasty  . MICROLARYNGOSCOPY WITH CO2 LASER AND EXCISION OF VOCAL CORD LESION N/A 06/05/2013   Procedure: MICROLARYNGOSCOPY WITH CO2 LASER AND EXCISION OF VOCAL CORD POLYPS;  Surgeon: Melida Quitter, MD;  Location: [place];  Service: ENT;  Laterality: N/A;  . TONSILLECTOMY    . TUBAL LIGATION  30 years  . VIDEO BRONCHOSCOPY  04/24/2012   Procedure: VIDEO BRONCHOSCOPY WITHOUT FLUORO;  Surgeon: Rigoberto Noel, MD;  Location: [place];  Service: Cardiopulmonary;  Laterality: Bilateral;    Family History  Problem Relation Age of Onset  . Breast cancer Mother   . Breast cancer Sister     Social History:  reports that she has quit smoking. Her smoking use included cigarettes. She has a 7.50 pack-year smoking history. She has never used smokeless tobacco. She reports that she does not drink alcohol or use drugs.  Allergies:  Allergies   Allergen Reactions  . Amoxapine And Related Nausea And Vomiting  . Amoxicillin     itching  . Ciprocinonide [Fluocinolone]     Pt unsure  . Ciprofloxacin     Pt unsure  . Doxycycline Nausea And Vomiting  . Erythromycin Hives  . Sulfa Antibiotics Other (See Comments)    Unknown-reaction as a child  . Zofran [Ondansetron Hcl] Nausea And Vomiting    Medications: I have reviewed the patient's current medications.  Results for orders placed or performed during the hospital encounter of 06/26/18 (from the past 48 hour(s))  CBC with Differential     Status: Abnormal   Collection Time: 06/26/18  3:06 PM  Result Value Ref Range   WBC 23.3 (H) 4.0 - 10.5 K/uL   RBC 5.08 3.87 - 5.11 MIL/uL   Hemoglobin 15.2 (H) 12.0 - 15.0 g/dL   HCT 46.1 (H) 36.0 - 46.0 %   MCV 90.7 80.0 - 100.0 fL   MCH 29.9 26.0 - 34.0 pg   MCHC 33.0 30.0 - 36.0 g/dL   RDW 13.2 11.5 - 15.5 %   Platelets 398 150 - 400 K/uL   nRBC 0.0 0.0 - 0.2 %   Neutrophils Relative % 81 %   Neutro Abs 18.7 (H) 1.7 - 7.7 K/uL   Lymphocytes Relative 10 %   Lymphs Abs 2.4 0.7 - 4.0 K/uL   Monocytes Relative  7 %   Monocytes Absolute 1.7 (H) 0.1 - 1.0 K/uL   Eosinophils Relative 1 %   Eosinophils Absolute 0.1 0.0 - 0.5 K/uL   Basophils Relative 0 %   Basophils Absolute 0.1 0.0 - 0.1 K/uL   Immature Granulocytes 1 %   Abs Immature Granulocytes 0.26 (H) 0.00 - 0.07 K/uL    Comment: Performed at [facility] [address] [place], [place] [address]    Dg Pelvis 1-2 Views  Result Date: 06/26/2018 CLINICAL DATA:  PER EMS, Pt lives ay [place] and fell in the bathroom and hit the back of her head. Pt also has lac to back of head . Pt reports pain to RT hip . EXAM: PELVIS - 1-2 VIEW COMPARISON:  CT, 09/19/2009. FINDINGS: Fracture of the right femoral neck, subcapital to mid femoral neck, nondisplaced and non comminuted, but with valgus angulation. No other fractures. No bone lesions. Hip joints, SI joints and symphysis  pubis are normally spaced and aligned. Skeletal structures are demineralized. Soft tissues are unremarkable. IMPRESSION: 1. Nondisplaced, non comminuted, valgus angulated fracture of the right femoral neck. 2. No other fractures.  No dislocation. Electronically Signed   By: Lajean Manes M.D.   On: 06/26/2018 14:55   Ct Head Wo Contrast  Result Date: 06/26/2018 CLINICAL DATA:  Head trauma, headache. Neck pain. EXAM: CT HEAD WITHOUT CONTRAST CT CERVICAL SPINE WITHOUT CONTRAST TECHNIQUE: Multidetector CT imaging of the head and cervical spine was performed following the standard protocol without intravenous contrast. Multiplanar CT image reconstructions of the cervical spine were also generated. COMPARISON:  04/02/2018 FINDINGS: CT HEAD FINDINGS Brain: No evidence of acute infarction, hemorrhage, hydrocephalus, extra-axial collection or mass lesion/mass effect. Generalized atrophy. Hypoattenuation of white matter, likely chronic microvascular ischemic change. Chronic LEFT parietal infarct, significant volume loss. Vascular: Calcification of the cavernous internal carotid arteries consistent with cerebrovascular atherosclerotic disease. No signs of intracranial large vessel occlusion. Skull: Calvarium intact. Sinuses/Orbits: No layering sinus fluid. Negative orbits. Other: Large RIGHT parietal scalp hematoma. CT CERVICAL SPINE FINDINGS Alignment: Normal. Skull base and vertebrae: No acute fracture. No primary bone lesion or focal pathologic process. Soft tissues and spinal canal: No prevertebral fluid or swelling. No visible canal hematoma. BILATERAL carotid bifurcation calcification. Disc levels:  No traumatic or calcified disc herniation. Upper chest: Biapical scarring and emphysematous change, greater on the RIGHT, without progression from January 2020 CT chest. Other: None. IMPRESSION: 1. No skull fracture or intracranial hemorrhage. Large RIGHT parietal scalp hematoma. 2. No cervical spine fracture or traumatic  subluxation. Electronically Signed   By: Staci Righter M.D.   On: 06/26/2018 13:50   Ct Cervical Spine Wo Contrast  Result Date: 06/26/2018 CLINICAL DATA:  Head trauma, headache. Neck pain. EXAM: CT HEAD WITHOUT CONTRAST CT CERVICAL SPINE WITHOUT CONTRAST TECHNIQUE: Multidetector CT imaging of the head and cervical spine was performed following the standard protocol without intravenous contrast. Multiplanar CT image reconstructions of the cervical spine were also generated. COMPARISON:  04/02/2018 FINDINGS: CT HEAD FINDINGS Brain: No evidence of acute infarction, hemorrhage, hydrocephalus, extra-axial collection or mass lesion/mass effect. Generalized atrophy. Hypoattenuation of white matter, likely chronic microvascular ischemic change. Chronic LEFT parietal infarct, significant volume loss. Vascular: Calcification of the cavernous internal carotid arteries consistent with cerebrovascular atherosclerotic disease. No signs of intracranial large vessel occlusion. Skull: Calvarium intact. Sinuses/Orbits: No layering sinus fluid. Negative orbits. Other: Large RIGHT parietal scalp hematoma. CT CERVICAL SPINE FINDINGS Alignment: Normal. Skull base and vertebrae: No acute fracture. No  primary bone lesion or focal pathologic process. Soft tissues and spinal canal: No prevertebral fluid or swelling. No visible canal hematoma. BILATERAL carotid bifurcation calcification. Disc levels:  No traumatic or calcified disc herniation. Upper chest: Biapical scarring and emphysematous change, greater on the RIGHT, without progression from January 2020 CT chest. Other: None. IMPRESSION: 1. No skull fracture or intracranial hemorrhage. Large RIGHT parietal scalp hematoma. 2. No cervical spine fracture or traumatic subluxation. Electronically Signed   By: Staci Righter M.D.   On: 06/26/2018 13:50   Dg Femur, Min 2 Views Right  Result Date: 06/26/2018 CLINICAL DATA:  PER EMS, Pt lives ay [place] and fell in the bathroom and  hit the back of her head. Pt also has lac to back of head . Pt reports pain to RT hip . EXAM: RIGHT FEMUR 2 VIEWS COMPARISON:  None. FINDINGS: Non comminuted fracture of the right femoral neck. No significant fracture displacement. There is valgus fracture angulation. No other fractures.  Hip and knee joints are normally aligned. Soft tissues are unremarkable. IMPRESSION: 1. Nondisplaced, non comminuted, valgus angulated fracture of the right femoral neck. 2. No other fractures.  No dislocation. Electronically Signed   By: Lajean Manes M.D.   On: 06/26/2018 14:54    Review of Systems  Constitutional: Negative for weight loss.  HENT: Negative for ear discharge, ear pain, hearing loss and tinnitus.   Eyes: Negative for blurred vision, double vision, photophobia and pain.  Respiratory: Negative for cough, sputum production and shortness of breath.   Cardiovascular: Negative for chest pain.  Gastrointestinal: Negative for abdominal pain, nausea and vomiting.  Genitourinary: Negative for dysuria, flank pain, frequency and urgency.  Musculoskeletal: Positive for joint pain (Right hip). Negative for back pain, falls, myalgias and neck pain.  Neurological: Negative for dizziness, tingling, sensory change, focal weakness, loss of consciousness and headaches.  Endo/Heme/Allergies: Does not bruise/bleed easily.  Psychiatric/Behavioral: Negative for depression, memory loss and substance abuse. The patient is not nervous/anxious.    Blood pressure 138/88, pulse (!) 121, resp. rate 16, height 5\' 2"  (1.575 m), weight 40.8 kg, SpO2 100 %. Physical Exam  Constitutional: She appears well-developed and well-nourished. No distress.  HENT:  Head: Normocephalic and atraumatic.  Eyes: Conjunctivae are normal. Right eye exhibits no discharge. Left eye exhibits no discharge. No scleral icterus.  Neck: Normal range of motion.  Cardiovascular: Normal rate and regular rhythm.  Respiratory: Effort normal. No respiratory  distress.  Musculoskeletal:     Comments: RLE No traumatic wounds, ecchymosis, or rash  TTP hip, knee  No knee or ankle effusion  Knee stable to varus/ valgus and anterior/posterior stress  Sens DPN, SPN, TN intact  Motor EHL, ext, flex, evers 5/5  DP 1+, PT 0, No significant edema  Neurological: She is alert.  Skin: Skin is warm and dry. She is not diaphoretic.  Psychiatric: She has a normal mood and affect. Her behavior is normal.    Assessment/Plan: Right hip fx -- Will need THA, likely tomorrow. IM to admit and clear for surgery. Appreciate their input. Multiple medical problems including COPD, GERD, hypertension, stroke, and vascular dementia -- per primary service    Lisette Abu, PA-C Orthopedic Surgery [phone] 06/26/2018, 3:30 PM

## 2018-06-26 NOTE — ED Notes (Signed)
Minette Brine, RN second RN to attempt I&O unsuccessfully, PA aware

## 2018-06-26 NOTE — ED Notes (Signed)
Patient transported to X-ray 

## 2018-06-27 ENCOUNTER — Encounter (HOSPITAL_COMMUNITY): Payer: Self-pay

## 2018-06-27 ENCOUNTER — Inpatient Hospital Stay (HOSPITAL_COMMUNITY): Payer: Medicare Other

## 2018-06-27 ENCOUNTER — Inpatient Hospital Stay (HOSPITAL_COMMUNITY): Payer: Medicare Other | Admitting: Anesthesiology

## 2018-06-27 ENCOUNTER — Encounter (HOSPITAL_COMMUNITY)
Admission: EM | Disposition: A | Discharge status: Skilled Nursing Facility | Payer: Self-pay | Source: Skilled Nursing Facility | Attending: Family Medicine

## 2018-06-27 DIAGNOSIS — S2241XA Multiple fractures of ribs, right side, initial encounter for closed fracture: Secondary | ICD-10-CM

## 2018-06-27 DIAGNOSIS — S72009A Fracture of unspecified part of neck of unspecified femur, initial encounter for closed fracture: Secondary | ICD-10-CM

## 2018-06-27 DIAGNOSIS — S72001A Fracture of unspecified part of neck of right femur, initial encounter for closed fracture: Secondary | ICD-10-CM | POA: Diagnosis present

## 2018-06-27 HISTORY — PX: TOTAL HIP ARTHROPLASTY: SHX124

## 2018-06-27 LAB — POCT I-STAT 4, (NA,K, GLUC, HGB,HCT)
Glucose, Bld: 133 mg/dL — ABNORMAL HIGH (ref 70–99)
HCT: 28 % — ABNORMAL LOW (ref 36.0–46.0)
Hemoglobin: 9.5 g/dL — ABNORMAL LOW (ref 12.0–15.0)
Potassium: 3.7 mmol/L (ref 3.5–5.1)
Sodium: 135 mmol/L (ref 135–145)

## 2018-06-27 LAB — URINALYSIS, ROUTINE W REFLEX MICROSCOPIC
Bacteria, UA: NONE SEEN
Bilirubin Urine: NEGATIVE
Glucose, UA: NEGATIVE mg/dL
Ketones, ur: 5 mg/dL — AB
Nitrite: NEGATIVE
Protein, ur: NEGATIVE mg/dL
Specific Gravity, Urine: 1.013 (ref 1.005–1.030)
pH: 5 (ref 5.0–8.0)

## 2018-06-27 LAB — SURGICAL PCR SCREEN
MRSA, PCR: POSITIVE — AB
Staphylococcus aureus: POSITIVE — AB

## 2018-06-27 SURGERY — ARTHROPLASTY, HIP, TOTAL, ANTERIOR APPROACH
Anesthesia: General | Laterality: Right

## 2018-06-27 MED ORDER — MUPIROCIN 2 % EX OINT
1.0000 "application " | TOPICAL_OINTMENT | Freq: Two times a day (BID) | CUTANEOUS | Status: AC
  Administered 2018-06-27 – 2018-07-01 (×10): 1 via NASAL
  Filled 2018-06-27 (×2): qty 22

## 2018-06-27 MED ORDER — PROPOFOL 10 MG/ML IV BOLUS
INTRAVENOUS | Status: DC | PRN
  Administered 2018-06-27: 100 mg via INTRAVENOUS
  Administered 2018-06-27: 50 mg via INTRAVENOUS

## 2018-06-27 MED ORDER — TRANEXAMIC ACID-NACL 1000-0.7 MG/100ML-% IV SOLN
1000.0000 mg | INTRAVENOUS | Status: AC
  Administered 2018-06-27: 1000 mg via INTRAVENOUS

## 2018-06-27 MED ORDER — BUPIVACAINE HCL (PF) 0.5 % IJ SOLN
INTRAMUSCULAR | Status: AC
  Filled 2018-06-27: qty 30

## 2018-06-27 MED ORDER — SODIUM CHLORIDE 0.9 % IV SOLN
INTRAVENOUS | Status: DC | PRN
  Administered 2018-06-27: 100 ug/min via INTRAVENOUS

## 2018-06-27 MED ORDER — MENTHOL 3 MG MT LOZG: 1.0000 | LOZENGE | OROMUCOSAL | Status: DC | PRN

## 2018-06-27 MED ORDER — KETOROLAC TROMETHAMINE 30 MG/ML IJ SOLN
INTRAMUSCULAR | Status: DC | PRN
  Administered 2018-06-27: 30 mg via INTRAVENOUS

## 2018-06-27 MED ORDER — SODIUM CHLORIDE 0.9 % IV SOLN
INTRAVENOUS | Status: AC | PRN
  Administered 2018-06-27: 1000 mL via INTRAMUSCULAR

## 2018-06-27 MED ORDER — ENSURE ENLIVE PO LIQD
237.0000 mL | Freq: Three times a day (TID) | ORAL | Status: DC
  Administered 2018-06-28 – 2018-06-30 (×7): 237 mL via ORAL

## 2018-06-27 MED ORDER — BUPIVACAINE-EPINEPHRINE (PF) 0.5% -1:200000 IJ SOLN
INTRAMUSCULAR | Status: AC
  Filled 2018-06-27: qty 30

## 2018-06-27 MED ORDER — SUGAMMADEX SODIUM 200 MG/2ML IV SOLN
INTRAVENOUS | Status: DC | PRN
  Administered 2018-06-27: 80 mg via INTRAVENOUS

## 2018-06-27 MED ORDER — POVIDONE-IODINE 10 % EX SWAB: 2.0000 "application " | Freq: Once | CUTANEOUS | Status: DC

## 2018-06-27 MED ORDER — BUPIVACAINE-EPINEPHRINE 0.5% -1:200000 IJ SOLN
INTRAMUSCULAR | Status: AC
  Filled 2018-06-27: qty 1

## 2018-06-27 MED ORDER — CHLORHEXIDINE GLUCONATE 4 % EX LIQD: 60.0000 mL | Freq: Once | CUTANEOUS | Status: DC

## 2018-06-27 MED ORDER — PHENOL 1.4 % MT LIQD: 1.0000 | OROMUCOSAL | Status: DC | PRN

## 2018-06-27 MED ORDER — METOCLOPRAMIDE HCL 5 MG/ML IJ SOLN
5.0000 mg | Freq: Three times a day (TID) | INTRAMUSCULAR | Status: DC | PRN
  Administered 2018-06-29: 10 mg via INTRAVENOUS
  Administered 2018-07-01: 5 mg via INTRAVENOUS
  Filled 2018-06-27 (×2): qty 2

## 2018-06-27 MED ORDER — METHOCARBAMOL 500 MG PO TABS
500.0000 mg | ORAL_TABLET | Freq: Two times a day (BID) | ORAL | Status: DC
  Administered 2018-06-27 – 2018-07-03 (×11): 500 mg via ORAL
  Filled 2018-06-27 (×11): qty 1

## 2018-06-27 MED ORDER — VANCOMYCIN HCL IN DEXTROSE 1-5 GM/200ML-% IV SOLN
1000.0000 mg | Freq: Once | INTRAVENOUS | Status: AC
  Administered 2018-06-27: 1000 mg via INTRAVENOUS
  Filled 2018-06-27: qty 200

## 2018-06-27 MED ORDER — ONDANSETRON HCL 4 MG/2ML IJ SOLN
INTRAMUSCULAR | Status: DC | PRN
  Administered 2018-06-27: 4 mg via INTRAVENOUS

## 2018-06-27 MED ORDER — LACTATED RINGERS IV SOLN
INTRAVENOUS | Status: DC | PRN
  Administered 2018-06-27 (×2): via INTRAVENOUS

## 2018-06-27 MED ORDER — VANCOMYCIN HCL 1000 MG IV SOLR
1000.0000 mg | Freq: Two times a day (BID) | INTRAVENOUS | Status: AC
  Administered 2018-06-27: 1000 mg via INTRAVENOUS
  Filled 2018-06-27: qty 1000

## 2018-06-27 MED ORDER — FENTANYL CITRATE (PF) 100 MCG/2ML IJ SOLN: 25.0000 ug | INTRAMUSCULAR | Status: DC | PRN

## 2018-06-27 MED ORDER — DOCUSATE SODIUM 100 MG PO CAPS
100.0000 mg | ORAL_CAPSULE | Freq: Two times a day (BID) | ORAL | Status: DC
  Administered 2018-06-27: 100 mg via ORAL
  Filled 2018-06-27: qty 1

## 2018-06-27 MED ORDER — EPHEDRINE SULFATE 50 MG/ML IJ SOLN
INTRAMUSCULAR | Status: DC | PRN
  Administered 2018-06-27: 10 mg via INTRAVENOUS

## 2018-06-27 MED ORDER — ENSURE PRE-SURGERY PO LIQD
296.0000 mL | Freq: Once | ORAL | Status: AC
  Administered 2018-06-27: 296 mL via ORAL
  Filled 2018-06-27: qty 296

## 2018-06-27 MED ORDER — ALBUMIN HUMAN 5 % IV SOLN
INTRAVENOUS | Status: DC | PRN
  Administered 2018-06-27 (×2): via INTRAVENOUS

## 2018-06-27 MED ORDER — 0.9 % SODIUM CHLORIDE (POUR BTL) OPTIME
TOPICAL | Status: DC | PRN
  Administered 2018-06-27: 10:00:00 1000 mL

## 2018-06-27 MED ORDER — VANCOMYCIN HCL IN DEXTROSE 1-5 GM/200ML-% IV SOLN: 1000.0000 mg | INTRAVENOUS | Status: DC

## 2018-06-27 MED ORDER — SENNOSIDES-DOCUSATE SODIUM 8.6-50 MG PO TABS
2.0000 | ORAL_TABLET | Freq: Two times a day (BID) | ORAL | Status: DC
  Administered 2018-06-27 – 2018-07-03 (×11): 2 via ORAL
  Filled 2018-06-27 (×12): qty 2

## 2018-06-27 MED ORDER — FENTANYL CITRATE (PF) 250 MCG/5ML IJ SOLN
INTRAMUSCULAR | Status: AC
  Filled 2018-06-27: qty 5

## 2018-06-27 MED ORDER — SODIUM CHLORIDE 0.9 % IR SOLN
Status: DC | PRN
  Administered 2018-06-27: 3000 mL

## 2018-06-27 MED ORDER — ROCURONIUM BROMIDE 50 MG/5ML IV SOSY
PREFILLED_SYRINGE | INTRAVENOUS | Status: DC | PRN
  Administered 2018-06-27: 20 mg via INTRAVENOUS
  Administered 2018-06-27: 10 mg via INTRAVENOUS

## 2018-06-27 MED ORDER — LIDOCAINE 2% (20 MG/ML) 5 ML SYRINGE
INTRAMUSCULAR | Status: DC | PRN
  Administered 2018-06-27: 60 mg via INTRAVENOUS

## 2018-06-27 MED ORDER — VANCOMYCIN HCL IN DEXTROSE 1-5 GM/200ML-% IV SOLN
INTRAVENOUS | Status: AC
  Administered 2018-06-27: 1000 mg via INTRAVENOUS
  Filled 2018-06-27: qty 200

## 2018-06-27 MED ORDER — SODIUM CHLORIDE (PF) 0.9 % IJ SOLN
INTRAMUSCULAR | Status: DC | PRN
  Administered 2018-06-27: 30 mL via INTRAVENOUS

## 2018-06-27 MED ORDER — METOCLOPRAMIDE HCL 5 MG PO TABS: 5.0000 mg | ORAL_TABLET | Freq: Three times a day (TID) | ORAL | Status: DC | PRN

## 2018-06-27 MED ORDER — ENOXAPARIN SODIUM 40 MG/0.4ML ~~LOC~~ SOLN
40.0000 mg | SUBCUTANEOUS | Status: DC
  Administered 2018-06-28: 40 mg via SUBCUTANEOUS
  Filled 2018-06-27: qty 0.4

## 2018-06-27 MED ORDER — TRANEXAMIC ACID-NACL 1000-0.7 MG/100ML-% IV SOLN
INTRAVENOUS | Status: AC
  Filled 2018-06-27: qty 100

## 2018-06-27 MED ORDER — BUPIVACAINE-EPINEPHRINE (PF) 0.5% -1:200000 IJ SOLN
INTRAMUSCULAR | Status: DC | PRN
  Administered 2018-06-27: 30 mL via PERINEURAL

## 2018-06-27 MED ORDER — PHENYLEPHRINE HCL 10 MG/ML IJ SOLN
INTRAMUSCULAR | Status: DC | PRN
  Administered 2018-06-27: 40 ug via INTRAVENOUS
  Administered 2018-06-27: 120 ug via INTRAVENOUS
  Administered 2018-06-27 (×2): 80 ug via INTRAVENOUS
  Administered 2018-06-27 (×5): 120 ug via INTRAVENOUS

## 2018-06-27 MED ORDER — PROPOFOL 10 MG/ML IV BOLUS
INTRAVENOUS | Status: AC
  Filled 2018-06-27: qty 20

## 2018-06-27 MED ORDER — FENTANYL CITRATE (PF) 100 MCG/2ML IJ SOLN
INTRAMUSCULAR | Status: DC | PRN
  Administered 2018-06-27 (×4): 50 ug via INTRAVENOUS

## 2018-06-27 MED ORDER — ONDANSETRON HCL 4 MG/2ML IJ SOLN: 4.0000 mg | Freq: Once | INTRAMUSCULAR | Status: DC | PRN

## 2018-06-27 MED ORDER — CHLORHEXIDINE GLUCONATE CLOTH 2 % EX PADS
6.0000 | MEDICATED_PAD | Freq: Every day | CUTANEOUS | Status: DC
  Administered 2018-06-27: 6 via TOPICAL

## 2018-06-27 MED ORDER — SUCCINYLCHOLINE CHLORIDE 20 MG/ML IJ SOLN
INTRAMUSCULAR | Status: DC | PRN
  Administered 2018-06-27: 40 mg via INTRAVENOUS

## 2018-06-27 MED ORDER — DEXAMETHASONE SODIUM PHOSPHATE 10 MG/ML IJ SOLN
INTRAMUSCULAR | Status: DC | PRN
  Administered 2018-06-27: 10 mg via INTRAVENOUS

## 2018-06-27 SURGICAL SUPPLY — 70 items
ALCOHOL ISOPROPYL (RUBBING) (MISCELLANEOUS) ×3 IMPLANT
BLADE CLIPPER SURG (BLADE) IMPLANT
CHLORAPREP W/TINT 26ML (MISCELLANEOUS) ×3 IMPLANT
COVER SURGICAL LIGHT HANDLE (MISCELLANEOUS) ×3 IMPLANT
COVER WAND RF STERILE (DRAPES) ×1 IMPLANT
CUP SECTOR GRIPTON 50MM (Cup) ×2 IMPLANT
DERMABOND ADVANCED (GAUZE/BANDAGES/DRESSINGS) ×4
DERMABOND ADVANCED .7 DNX12 (GAUZE/BANDAGES/DRESSINGS) ×2 IMPLANT
DRAPE C-ARM 42X72 X-RAY (DRAPES) ×3 IMPLANT
DRAPE INCISE IOBAN 66X45 STRL (DRAPES) ×2 IMPLANT
DRAPE STERI IOBAN 125X83 (DRAPES) ×3 IMPLANT
DRAPE U-SHAPE 47X51 STRL (DRAPES) ×9 IMPLANT
DRSG AQUACEL AG ADV 3.5X10 (GAUZE/BANDAGES/DRESSINGS) ×3 IMPLANT
ELECT BLADE 4.0 EZ CLEAN MEGAD (MISCELLANEOUS) ×3
ELECT PENCIL ROCKER SW 15FT (MISCELLANEOUS) ×3 IMPLANT
ELECT REM PT RETURN 9FT ADLT (ELECTROSURGICAL) ×3
ELECTRODE BLDE 4.0 EZ CLN MEGD (MISCELLANEOUS) ×1 IMPLANT
ELECTRODE REM PT RTRN 9FT ADLT (ELECTROSURGICAL) ×1 IMPLANT
EVACUATOR 1/8 PVC DRAIN (DRAIN) IMPLANT
GLOVE BIO SURGEON STRL SZ8.5 (GLOVE) ×10 IMPLANT
GLOVE BIOGEL PI IND STRL 6.5 (GLOVE) IMPLANT
GLOVE BIOGEL PI IND STRL 7.0 (GLOVE) IMPLANT
GLOVE BIOGEL PI IND STRL 8.5 (GLOVE) ×1 IMPLANT
GLOVE BIOGEL PI INDICATOR 6.5 (GLOVE) ×2
GLOVE BIOGEL PI INDICATOR 7.0 (GLOVE) ×2
GLOVE BIOGEL PI INDICATOR 8.5 (GLOVE) ×6
GLOVE SURG SS PI 7.0 STRL IVOR (GLOVE) ×2 IMPLANT
GOWN STRL REUS W/ TWL LRG LVL3 (GOWN DISPOSABLE) ×2 IMPLANT
GOWN STRL REUS W/ TWL XL LVL3 (GOWN DISPOSABLE) IMPLANT
GOWN STRL REUS W/TWL 2XL LVL3 (GOWN DISPOSABLE) ×5 IMPLANT
GOWN STRL REUS W/TWL LRG LVL3 (GOWN DISPOSABLE) ×8
GOWN STRL REUS W/TWL XL LVL3 (GOWN DISPOSABLE) ×2
HANDPIECE INTERPULSE COAX TIP (DISPOSABLE) ×2
HEAD FEMORAL 32 CERAMIC (Hips) ×2 IMPLANT
HOOD PEEL AWAY FACE SHEILD DIS (HOOD) ×6 IMPLANT
HOOD PEEL AWAY FLYTE STAYCOOL (MISCELLANEOUS) ×4 IMPLANT
KIT BASIN OR (CUSTOM PROCEDURE TRAY) ×3 IMPLANT
KIT TURNOVER KIT B (KITS) ×3 IMPLANT
LINER ACETABULAR 32X50 (Liner) ×2 IMPLANT
MANIFOLD NEPTUNE II (INSTRUMENTS) ×3 IMPLANT
MARKER SKIN DUAL TIP RULER LAB (MISCELLANEOUS) ×6 IMPLANT
NDL SPNL 18GX3.5 QUINCKE PK (NEEDLE) ×1 IMPLANT
NEEDLE SPNL 18GX3.5 QUINCKE PK (NEEDLE) ×6 IMPLANT
NS IRRIG 1000ML POUR BTL (IV SOLUTION) ×3 IMPLANT
PACK TOTAL JOINT (CUSTOM PROCEDURE TRAY) ×3 IMPLANT
PACK UNIVERSAL I (CUSTOM PROCEDURE TRAY) ×3 IMPLANT
PAD ARMBOARD 7.5X6 YLW CONV (MISCELLANEOUS) ×6 IMPLANT
SAW OSC TIP CART 19.5X105X1.3 (SAW) ×5 IMPLANT
SCREW 6.5MMX25MM (Screw) ×2 IMPLANT
SCREW 6.5MMX30MM (Screw) ×4 IMPLANT
SEALER BIPOLAR AQUA 6.0 (INSTRUMENTS) ×2 IMPLANT
SET HNDPC FAN SPRY TIP SCT (DISPOSABLE) ×1 IMPLANT
SLEEVE CABLE 2MM VT (Orthopedic Implant) ×2 IMPLANT
SOL PREP POV-IOD 4OZ 10% (MISCELLANEOUS) ×3 IMPLANT
STEM TRI LOC BPS SZ6 W GRIPTON IMPLANT
SUT ETHIBOND NAB CT1 #1 30IN (SUTURE) ×6 IMPLANT
SUT MNCRL AB 3-0 PS2 18 (SUTURE) ×5 IMPLANT
SUT MON AB 2-0 CT1 36 (SUTURE) ×3 IMPLANT
SUT VIC AB 1 CT1 27 (SUTURE) ×2
SUT VIC AB 1 CT1 27XBRD ANBCTR (SUTURE) ×1 IMPLANT
SUT VIC AB 2-0 CT1 27 (SUTURE) ×2
SUT VIC AB 2-0 CT1 TAPERPNT 27 (SUTURE) ×1 IMPLANT
SUT VLOC 180 0 24IN GS25 (SUTURE) ×3 IMPLANT
SYR 50ML LL SCALE MARK (SYRINGE) ×3 IMPLANT
TOWEL OR 17X24 6PK STRL BLUE (TOWEL DISPOSABLE) ×3 IMPLANT
TOWEL OR 17X26 10 PK STRL BLUE (TOWEL DISPOSABLE) ×3 IMPLANT
TRAY CATH 16FR W/PLASTIC CATH (SET/KITS/TRAYS/PACK) IMPLANT
TRAY FOLEY CATH SILVER 16FR (SET/KITS/TRAYS/PACK) IMPLANT
TRI LOC BPS SZ 6 W GRIPTON ×3 IMPLANT
WATER STERILE IRR 1000ML POUR (IV SOLUTION) ×9 IMPLANT

## 2018-06-27 NOTE — Op Note (Signed)
OPERATIVE REPORT  SURGEON: Rod Can, MD   ASSISTANT: Staff.  PREOPERATIVE DIAGNOSIS: Displaced Right femoral neck fracture.   POSTOPERATIVE DIAGNOSIS: Displaced Right femoral neck fracture.   PROCEDURE: Right total hip arthroplasty, anterior approach.   IMPLANTS: DePuy Tri Lock stem, size 6, hi offset. DePuy Pinnacle Cup, size 50 mm. DePuy Altrx liner, size 32 by 50 mm, neutral. DePuy Biolox ceramic head ball, size 32 + 1 mm. 6.5 mm cancellus bone screw x2. Dall-Miles 2.0 mm adult reconstruction cable x1  ANESTHESIA:  General  ESTIMATED BLOOD LOSS:-250 mL    ANTIBIOTICS: 1 g vancomycin.  DRAINS: None.  COMPLICATIONS: Retained metallic foreign body.   CONDITION: PACU - hemodynamically stable.   BRIEF CLINICAL NOTE: [name] is a 73 y.o. female with a history of dementia who presented with a displaced Right femoral neck fracture.  She was admitted to the hospitalist service for perioperative or stratification and medical optimization. The patient was indicated for total hip arthroplasty. The risks, benefits, and alternatives to the procedure were explained, and the patient/POA elected to proceed.  PROCEDURE IN DETAIL: Surgical site was marked by myself in the pre-op holding area. Once inside the operating room, spinal anesthesia was obtained, and a foley catheter was inserted. The patient was then positioned on the Hana table. All bony prominences were well padded. The hip was prepped and draped in the normal sterile surgical fashion. A time-out was called verifying side and site of surgery. The patient received IV antibiotics within 60 minutes of beginning the procedure.  The direct anterior approach to the hip was performed through the Hueter interval. Lateral femoral circumflex vessels were treated with the Auqumantys. The anterior capsule was exposed and an inverted T capsulotomy was made. The fracture hematoma was encountered and evacuated.  She was  found to have a comminuted subcapital/mid cervical femoral neck fracture.  The femoral neck cut was made to the level of the templated cut. A corkscrew was placed into the head and the head was removed. The head was passed to the back table and was measured.  Acetabular exposure was achieved, and the pulvinar and labrum were excised. Sequential reaming of the acetabulum was then performed up to a size 49 mm reamer. A 50 mm cup was then opened and impacted into place at approximately 40 degrees of abduction and 20 degrees of anteversion.  The cup achieved acceptable press-fit fixation, and I elected to augment with cancellus bone screws.  The first screw was placed without any difficulty.  As drilling was commencing for the second screw, the drill bit fractured.  I used a tonsil to try to locate the drill bit under fluoroscopy, however this was not possible.  I elected to leave the drill bit in order to prevent further destruction of the bone trying to remove it.  A second screw was placed without any difficulty.  The final polyethylene liner was impacted into place and acetabular osteophytes were removed.   I then gained femoral exposure taking care to protect the abductors and greater trochanter. This was performed using standard external rotation, extension, and adduction. The capsule was peeled off the inner aspect of the greater trochanter, taking care to preserve the short external rotators.  There was a V shaped crack in the posterior medial calcar.  Therefore, I placed a single 2.0 mm Dall-Miles adult reconstruction cable periosteally just superior to the level of the lesser trochanter.  A cookie cutter was used to enter the femoral canal, and then the  femoral canal finder was placed. Sequential broaching was performed up to a size 6. Calcar planer was used on the femoral neck remnant. I placed a hi offset neck and a trial head ball. The hip was reduced. Leg lengths and offset were checked  fluoroscopically. The hip was dislocated and trial components were removed. The final implants were placed, and the hip was reduced.  Fluoroscopy was used to confirm component position and leg lengths. At 90 degrees of external rotation and full extension, the hip was stable to an anterior directed force.  The wound was copiously irrigated with normal saline using pulse lavage. Marcaine solution was injected into the periarticular soft tissue. The wound was closed in layers using #1 Vicryl and V-Loc for the fascia, 2-0 Vicryl for the subcutaneous fat, 2-0 Monocryl for the deep dermal layer, 3-0 running Monocryl subcuticular stitch, and Dermabond for the skin. Once the glue was fully dried, an Aquacell Ag dressing was applied. The patient was transported to the recovery room in stable condition. Sponge, needle, and instrument counts were correct at the end of the case x2. The patient tolerated the procedure well and there were no known complications.  Postoperatively, the patient will be readmitted to the hospitalist service.  She may weight-bear as tolerated with a walker.  Begin Lovenox in-house for DVT prophylaxis, and then discharge on aspirin 81 mg p.o. twice daily for 6 weeks.  Mobilize out of bed with physical therapy.  Disposition planning.  She will return to the office in 2 weeks for routine postoperative care.

## 2018-06-27 NOTE — Anesthesia Postprocedure Evaluation (Signed)
Anesthesia Post Note  Patient: KAYNA SUPPA  Procedure(s) Performed: TOTAL HIP ARTHROPLASTY ANTERIOR APPROACH (Right )     Patient location during evaluation: PACU Anesthesia Type: General Level of consciousness: awake Pain management: pain level controlled Vital Signs Assessment: post-procedure vital signs reviewed and stable Respiratory status: spontaneous breathing, nonlabored ventilation, respiratory function stable and patient connected to nasal cannula oxygen Cardiovascular status: blood pressure returned to baseline and stable Postop Assessment: no apparent nausea or vomiting Anesthetic complications: no    Last Vitals:  Vitals:   06/27/18 1320 06/27/18 1404  BP: (!) 98/53 (!) 108/55  Pulse: 95 86  Resp: 11 15  Temp: 36.6 C (!) 36.4 C  SpO2: 93% 100%    Last Pain:  Vitals:   06/27/18 1440  TempSrc:   PainSc: 10-Worst pain ever                 Ryan P Ellender

## 2018-06-27 NOTE — Progress Notes (Signed)
Patient unable to sign consent, called daughter [name] to obtain telephone consent but no reply, left voice message.

## 2018-06-27 NOTE — Transfer of Care (Signed)
Immediate Anesthesia Transfer of Care Note  Patient: [name]  Procedure(s) Performed: TOTAL HIP ARTHROPLASTY ANTERIOR APPROACH (Right )  Patient Location: PACU  Anesthesia Type:General  Level of Consciousness: sedated  Airway & Oxygen Therapy: Patient Spontanous Breathing and Patient connected to face mask oxygen  Post-op Assessment: Report given to RN and Post -op Vital signs reviewed and stable  Post vital signs: Reviewed and stable  Last Vitals:  Vitals Value Taken Time  BP 109/56 06/27/2018 12:49 PM  Temp    Pulse 84 06/27/2018 12:53 PM  Resp 24 06/27/2018 12:53 PM  SpO2 100 % 06/27/2018 12:53 PM  Vitals shown include unvalidated device data.  Last Pain:  Vitals:   06/27/18 0733  TempSrc:   PainSc: 9       Patients Stated Pain Goal: 2 (16/10/96 0454)  Complications: No apparent anesthesia complications

## 2018-06-27 NOTE — Progress Notes (Signed)
Contacted Dr. Delfino Lovett regarding patients + MRSA/MSSA PCR. Given verbal order for Vancomycin.

## 2018-06-27 NOTE — Progress Notes (Addendum)
Initial Nutrition Assessment  RD working remotely.  DOCUMENTATION CODES:   Underweight  INTERVENTION:   -Continue B-complex with vitamin C daily -Continue 500 mg vitamin C daily -Ensure Enlive po TID, each supplement provides 350 kcal and 20 grams of protein  NUTRITION DIAGNOSIS:   Increased nutrient needs related to post-op healing as evidenced by estimated needs.  GOAL:   Patient will meet greater than or equal to 90% of their needs  MONITOR:   Diet advancement, PO intake, Supplement acceptance, Labs, Weight trends, Skin, I & O's  REASON FOR ASSESSMENT:   Other (Comment)    ASSESSMENT:   [name] is a 73 y.o. female with medical history significant of COPD, depression, gastroesophageal reflux disease, hypertension, previous thrombotic stroke, and vascular dementia who presents to the emergency department today after a fall in the bathroom this morning.  Patient fell and hit the back of her head where she had a laceration and a hematoma.  She also is complaining of right hip pain.  After the mechanical fall she complained of an associated headache, she has a scalp laceration and pain in the right hip and thigh.  She denies any vision changes, nausea, neck pain, back pain, or other injuries.  Per EMS she is at her baseline mentally.  Pt admitted with rt femoral neck fracture. CXR also reveals rt rib fractures.   Per orthopedics notes, plan for total rt hip arthoplasty today.   Reviewed I/O's: -250 ml x 24 hours  UOP: 750 ml x 24 hours  Pt down in OR at time of visit. Unable to obtain further history secondary to vascular dementia.   Reviewed wt hx; pt has experienced a 18.2% wt loss over the past year, which while not significant is concerning given underweight status. Pt is under hospice care at [facility]; suspect poor oral intake and nutritional decline.  Pt currently NPO, however, would benefit from addition of nutritional supplements. RD will order  post-op.   Labs reviewed.   NUTRITION - FOCUSED PHYSICAL EXAM:    Most Recent Value  Orbital Region  Unable to assess  Upper Arm Region  Unable to assess  Thoracic and Lumbar Region  Unable to assess  Buccal Region  Unable to assess  Temple Region  Unable to assess  Clavicle Bone Region  Unable to assess  Clavicle and Acromion Bone Region  Unable to assess  Scapular Bone Region  Unable to assess  Dorsal Hand  Unable to assess  Patellar Region  Unable to assess  Anterior Thigh Region  Unable to assess  Posterior Calf Region  Unable to assess  Edema (RD Assessment)  Unable to assess  Hair  Unable to assess  Eyes  Unable to assess  Mouth  Unable to assess  Skin  Unable to assess  Nails  Unable to assess       Diet Order:   Diet Order            Diet NPO time specified Except for: Sips with Meds  Diet effective midnight              EDUCATION NEEDS:   No education needs have been identified at this time  Skin:  Skin Assessment: Skin Integrity Issues: Skin Integrity Issues:: Incisions, Other (Comment) Incisions: closed rt hip Other: posterior head laceration  Last BM:  Unknown  Height:   Ht Readings from Last 1 Encounters:  06/26/18 5\' 2"  (1.575 m)    Weight:   Wt  Readings from Last 1 Encounters:  06/26/18 40.8 kg    Ideal Body Weight:  50 kg  BMI:  Body mass index is 16.46 kg/m.  Estimated Nutritional Needs:   Kcal:  1250-1450  Protein:  50-65 grams  Fluid:  > 1.2 L    Wakisha Alberts A. Jimmye Norman, RD, LDN, [place] Registered Dietitian II Certified Diabetes Care and Education Specialist Pager: [phone] After hours Pager: [phone]

## 2018-06-27 NOTE — Social Work (Signed)
CSW discussed pt case in morning progression pt is from [facility] which is an ALF not a SNF. Will need therapy evalutions post surgery.  CSW continuing to follow for support with disposition when medically appropriate.  Westley Hummer, MSW, [place] Work [phone]

## 2018-06-27 NOTE — Plan of Care (Signed)
  Problem: Activity: Goal: Risk for activity intolerance will decrease Outcome: Progressing   Problem: Elimination: Goal: Will not experience complications related to bowel motility Outcome: Progressing Goal: Will not experience complications related to urinary retention Outcome: Progressing   Problem: Pain Managment: Goal: General experience of comfort will improve Outcome: Progressing   

## 2018-06-27 NOTE — Progress Notes (Signed)
Spoke with patients daughter Jadynn Epping and given verbal consent for surgery.

## 2018-06-27 NOTE — Interval H&P Note (Signed)
History and Physical Interval Note:  06/27/2018 9:49 AM  [name]  has presented today for surgery, with the diagnosis of right hip fracture.  The various methods of treatment have been discussed with the patient and family. After consideration of risks, benefits and other options for treatment, the patient has consented to  Procedure(s): TOTAL HIP ARTHROPLASTY ANTERIOR APPROACH (Right) as a surgical intervention.  The patient's history has been reviewed, patient examined, no change in status, stable for surgery.  I have reviewed the patient's chart and labs.  Questions were answered to the patient's satisfaction.    The risks, benefits, and alternatives were discussed with the patient. There are risks associated with the surgery including, but not limited to, problems with anesthesia (death), infection, instability (giving out of the joint), dislocation, differences in leg length/angulation/rotation, fracture of bones, loosening or failure of implants, hematoma (blood accumulation) which may require surgical drainage, blood clots, pulmonary embolism, nerve injury (foot drop and lateral thigh numbness), and blood vessel injury. The patient understands these risks and elects to proceed.    Hilton Cork Will Schier

## 2018-06-27 NOTE — Discharge Instructions (Signed)
°Dr. Yuridiana Formanek °Joint Replacement Specialist °[facility] °[address] °[place], [place] [address] °[phone] ° ° °TOTAL HIP REPLACEMENT POSTOPERATIVE DIRECTIONS ° ° ° °Hip Rehabilitation, Guidelines Following Surgery  ° °WEIGHT BEARING °Weight bearing as tolerated with assist device (walker, cane, etc) as directed, use it as long as suggested by your surgeon or therapist, typically at least 4-6 weeks. ° °The results of a hip operation are greatly improved after range of motion and muscle strengthening exercises. Follow all safety measures which are given to protect your hip. If any of these exercises cause increased pain or swelling in your joint, decrease the amount until you are comfortable again. Then slowly increase the exercises. Call your caregiver if you have problems or questions.  ° °HOME CARE INSTRUCTIONS  °Most of the following instructions are designed to prevent the dislocation of your new hip.  °Remove items at home which could result in a fall. This includes throw rugs or furniture in walking pathways.  °Continue medications as instructed at time of discharge. °· You may have some home medications which will be placed on hold until you complete the course of blood thinner medication. °· You may start showering once you are discharged home. Do not remove your dressing. °Do not put on socks or shoes without following the instructions of your caregivers.   °Sit on chairs with arms. Use the chair arms to help push yourself up when arising.  °Arrange for the use of a toilet seat elevator so you are not sitting low.  °· Walk with walker as instructed.  °You may resume a sexual relationship in one month or when given the OK by your caregiver.  °Use walker as long as suggested by your caregivers.  °You may put full weight on your legs and walk as much as is comfortable. °Avoid periods of inactivity such as sitting longer than an hour when not asleep. This helps prevent  blood clots.  °You may return to work once you are cleared by your surgeon.  °Do not drive a car for 6 weeks or until released by your surgeon.  °Do not drive while taking narcotics.  °Wear elastic stockings for two weeks following surgery during the day but you may remove then at night.  °Make sure you keep all of your appointments after your operation with all of your doctors and caregivers. You should call the office at the above phone number and make an appointment for approximately two weeks after the date of your surgery. °Please pick up a stool softener and laxative for home use as long as you are requiring pain medications. °· ICE to the affected hip every three hours for 30 minutes at a time and then as needed for pain and swelling. Continue to use ice on the hip for pain and swelling from surgery. You may notice swelling that will progress down to the foot and ankle.  This is normal after surgery.  Elevate the leg when you are not up walking on it.   °It is important for you to complete the blood thinner medication as prescribed by your doctor. °· Continue to use the breathing machine which will help keep your temperature down.  It is common for your temperature to cycle up and down following surgery, especially at night when you are not up moving around and exerting yourself.  The breathing machine keeps your lungs expanded and your temperature down. ° °RANGE OF MOTION AND STRENGTHENING EXERCISES  °These exercises are   designed to help you keep full movement of your hip joint. Follow your caregiver's or physical therapist's instructions. Perform all exercises about fifteen times, three times per day or as directed. Exercise both hips, even if you have had only one joint replacement. These exercises can be done on a training (exercise) mat, on the floor, on a table or on a bed. Use whatever works the best and is most comfortable for you. Use music or television while you are exercising so that the exercises  are a pleasant break in your day. This will make your life better with the exercises acting as a break in routine you can look forward to.  °Lying on your back, slowly slide your foot toward your buttocks, raising your knee up off the floor. Then slowly slide your foot back down until your leg is straight again.  °Lying on your back spread your legs as far apart as you can without causing discomfort.  °Lying on your side, raise your upper leg and foot straight up from the floor as far as is comfortable. Slowly lower the leg and repeat.  °Lying on your back, tighten up the muscle in the front of your thigh (quadriceps muscles). You can do this by keeping your leg straight and trying to raise your heel off the floor. This helps strengthen the largest muscle supporting your knee.  °Lying on your back, tighten up the muscles of your buttocks both with the legs straight and with the knee bent at a comfortable angle while keeping your heel on the floor.  ° °SKILLED REHAB INSTRUCTIONS: °If the patient is transferred to a skilled rehab facility following release from the hospital, a list of the current medications will be sent to the facility for the patient to continue.  When discharged from the skilled rehab facility, please have the facility set up the patient's Home Health Physical Therapy prior to being released. Also, the skilled facility will be responsible for providing the patient with their medications at time of release from the facility to include their pain medication and their blood thinner medication. If the patient is still at the rehab facility at time of the two week follow up appointment, the skilled rehab facility will also need to assist the patient in arranging follow up appointment in our office and any transportation needs. ° °MAKE SURE YOU:  °Understand these instructions.  °Will watch your condition.  °Will get help right away if you are not doing well or get worse. ° °Pick up stool softner and  laxative for home use following surgery while on pain medications. °Do not remove your dressing. °The dressing is waterproof--it is OK to take showers. °Continue to use ice for pain and swelling after surgery. °Do not use any lotions or creams on the incision until instructed by your surgeon. °Total Hip Protocol. ° ° °

## 2018-06-27 NOTE — TOC Initial Note (Addendum)
Transition of Care [facility]) - Initial/Assessment Note    Patient Details  Name: [name] MRN: [id number] Date of Birth: 1945/10/16  [facility]) CM/SW Contact:    Alexander Mt, [place] Phone Number: 06/27/2018, 1:59 PM  Clinical Narrative:                 CSW spoke with pt daughter [name] via telephone. Introduced self, role, and reason for visit. Pt daughter lives in [place], pt has been living at [facility] for about a year, prior to admission she was living at [facility] and had been at [facility] also after initial stroke. Pt daughter wants PT/OT recommendations and follow up call.   Pt daughter interested in SNF with hospice or back to ALF with hospice, whichever can be safely managed. Pt managed by [facility] which is still requiring authorization during coronavirus pandemic. Pt will need new PASRR if she goes to SNF- pt daughter specifically requests referral not be made to [facility].   Follow and await PT/OT evals.     Expected Discharge Plan: [place] Barriers to Discharge: Continued Medical Work up, [place] (PASRR)   Patient Goals and CMS Choice Patient states their goals for this hospitalization and ongoing recovery are:: for her to be taken care of   Choice offered to / list presented to : Adult Children(pt daughter)  Expected Discharge Plan and Services Expected Discharge Plan: [place] In-house Referral: Hospice / [place] Choice: [place] arrangements for the past 2 months: [place]: [place] [facility](Hospice)  Prior Living Arrangements/Services Living arrangements for the past 2 months: [place] Lives with:: Facility Resident Patient language and need for interpreter reviewed:: No Do you feel safe going back to the place where you live?: Yes       Need for Family Participation in Patient Care: Yes (Comment)(decision making) Care giver support system in place?: Yes (comment)(adult daughter; hospice; facility support) Current home services: Hospice Criminal Activity/Legal Involvement Pertinent to Current Situation/Hospitalization: No - Comment as needed  Activities of Daily Living      Permission Sought/Granted Permission sought to share information with : Family Supports, Case Manager Permission granted to share information with : No(pt only oriented to self)  Share Information with NAME: Lawrence [place]   Permission granted to share info w AGENCY: [facility]  Permission granted to share info w Relationship: daughter  Permission granted to share info w Contact Information: [phone]  Emotional Assessment Appearance:: Appears stated age Attitude/Demeanor/Rapport: Unable to Assess Affect (typically observed): Unable to Assess Orientation: : Oriented to Self, Fluctuating Orientation (Suspected and/or reported Sundowners) Alcohol / Substance Use: Not Applicable Psych Involvement: Outpatient Provider  Admission diagnosis:  Leukocytosis [D72.829] Pain [R52] Fall [W19.XXXA] Knee pain, right [M25.561] Closed fracture of neck of right femur, initial encounter ([place]) [S72.001A] Scalp laceration, initial encounter [S01.01XA] Fall at nursing home, initial encounter [W19.Merril Abbe, Y92.129] Multiple rib fractures involving four or more ribs [S22.49XA] Patient Active Problem List   Diagnosis Date Noted  . Displaced fracture of right femoral neck ([place]) 06/27/2018  . Multiple fractures of ribs, right side, initial encounter for closed fracture 06/26/2018  . Fracture of femoral neck, right, closed ([place]) 06/26/2018  . Hematoma of right parietal scalp 06/26/2018  . Leukocytosis 06/26/2018  .  Inadequate pain control 06/26/2018  . Hip fracture ([place]) 06/26/2018  . AKI (acute kidney injury) ([place]) 04/04/2018  . Hypoxia  04/04/2018  . Probable sepsis 04/04/2018  . Vascular dementia without behavioral disturbance ([place]) 06/30/2016  . Disease ruled out after examination   . Aphasia 05/15/2016  . Atherosclerosis of left carotid artery   . Cerebral embolism with cerebral infarction 02/27/2016  . Acute encephalopathy 02/26/2016  . Osteoporosis 07/04/2015  . Impaired cognition 03/30/2014  . Vitamin D deficiency 08/18/2013  . Hyperlipidemia 08/18/2013  . Prediabetes 08/18/2013  . Hospice care patient 08/18/2013  . CKD (chronic kidney disease) stage 3, GFR 30-59 ml/min ([name]) 07/01/2013  . Anemia of chronic disease 07/01/2013  . Depression, major, in remission ([place]) 06/30/2013  . Essential hypertension 02/09/2013  . COPD GOLD III 03/28/2012  . Vocal cord dysfunction 03/28/2012  . Carotid artery aneurysm ([place]) 03/04/2012  . Smoker 03/04/2012   PCP:  Jeanette Caprice, PA-C Pharmacy:  No Pharmacies Listed    Social Determinants of Health (SDOH) Interventions    Readmission Risk Interventions No flowsheet data found.

## 2018-06-27 NOTE — Anesthesia Preprocedure Evaluation (Addendum)
Anesthesia Evaluation  Patient identified by MRN, date of birth, ID band Patient confused    Reviewed: Allergy & Precautions, NPO status , Patient's Chart, lab work & pertinent test results  Airway Mallampati: II  TM Distance: >3 FB Neck ROM: Full    Dental  (+) Missing, Poor Dentition, Dental Advidsory Given   Pulmonary COPD,  COPD inhaler, former smoker,  Multiple fractures of the ribs on the right side numbers 3, 4, 5, and 6   Pulmonary exam normal breath sounds clear to auscultation       Cardiovascular hypertension, On Medications Normal cardiovascular exam Rhythm:Regular Rate:Normal  ECG: ST, rate 123   Neuro/Psych PSYCHIATRIC DISORDERS Depression Dementia CVA    GI/Hepatic Neg liver ROS, GERD  ,  Endo/Other  negative endocrine ROS  Renal/GU negative Renal ROS     Musculoskeletal  (+) Arthritis ,   Abdominal   Peds  Hematology  (+) Blood dyscrasia, , HLD   Anesthesia Other Findings Right hip fracture  Reproductive/Obstetrics                           Anesthesia Physical Anesthesia Plan  ASA: III  Anesthesia Plan: General   Post-op Pain Management:    Induction: Intravenous  PONV Risk Score and Plan: 3 and Ondansetron, Dexamethasone and Treatment may vary due to age or medical condition  Airway Management Planned: Oral ETT  Additional Equipment:   Intra-op Plan:   Post-operative Plan: Extubation in OR  Informed Consent: I have reviewed the patients History and Physical, chart, labs and discussed the procedure including the risks, benefits and alternatives for the proposed anesthesia with the patient or authorized representative who has indicated his/her understanding and acceptance.     Dental advisory given and Dental Advisory Given  Plan Discussed with: CRNA  Anesthesia Plan Comments:        Anesthesia Quick Evaluation

## 2018-06-27 NOTE — Progress Notes (Signed)
Patient admitted from ED to 6N06. Alert and oriented 2. Vital signs within normal. Laceration noted to back of head with small amount of bleeding noted. Oriented to room and  Remote control. Will page admitting MD.

## 2018-06-27 NOTE — Progress Notes (Signed)
Patient Demographics:    [name], is a 73 y.o. female, DOB - 1945/09/04, GXQ:[id number]  Admit date - 06/26/2018   Admitting Physician Lady Deutscher, MD  Outpatient Primary MD for the patient is [name], [name], PA-C  LOS - 1   Chief Complaint  Patient presents with   Fall        Subjective:    [name] today has no fevers, no emesis,  No chest pain,     Assessment  & Plan :    Principal Problem:   Fracture of femoral neck, right, closed [facility]) Active Problems:   Hospice care patient   Multiple fractures of ribs, right side, initial encounter for closed fracture   Hematoma of right parietal scalp   Leukocytosis   Inadequate pain control   Hip fracture ([name])   Displaced fracture of right femoral neck ([name])  Brief Summary:- 73 y.o. female with medical history significant of COPD, depression, gastroesophageal reflux disease, hypertension, previous thrombotic stroke, and vascular dementia previously under hospice care at [facility] home who was admitted on 06/26/2018 after a fall with closed head injury/hematoma, right-sided rib fractures #3, 4, 5 and 6 and right hip fracture  Plan:- 1)Advanced Dementia- Hospice care patient: She is followed by [facility]  hospice out of [place], resides at [facility] home, continue Zyprexa 7.5 mg twice daily, Remeron 30 mg nightly, may use lorazepam as needed agitation/restlessness  2)Rt Hip Fx/right femoral neck fracture--- status post Right total hip arthroplasty on 06/27/18 by Dr. [name]  4)Social/Ethics--DNR/DNI, daughter is [name] [phone]  3)Multiple right-sided rib fractures--- ribs 3, 4 5 and 6--- pain control as ordered   Disposition/Need for in-Hospital Stay- patient unable to be discharged at this time due to right hip fracture requiring operative correction  Code Status : DNR/DNI  Family Communication:   Daughter ,   daughter is [name] [phone]   PROCEDURE: Right total hip arthroplasty, anterior approach on 06/27/18 By Dr [name]  Disposition Plan  :  SNF  Consults  :  ortho  DVT Prophylaxis  :  Lovenox -    Lab Results  Component Value Date   PLT 398 06/26/2018    Inpatient Medications  Scheduled Meds:  acetaminophen  650 mg Oral Q6H   atorvastatin  20 mg Oral q1800   B-complex with vitamin C  1 tablet Oral Daily   docusate sodium  100 mg Oral BID   [START ON 06/28/2018] enoxaparin (LOVENOX) injection  40 mg Subcutaneous Q24H   [START ON 06/28/2018] feeding supplement (ENSURE ENLIVE)  237 mL Oral TID BM   gabapentin  300 mg Oral QHS   losartan  100 mg Oral Daily   mirtazapine  30 mg Oral QHS   mupirocin ointment  1 application Nasal BID   OLANZapine  7.5 mg Oral BID   vitamin C  500 mg Oral Daily   Continuous Infusions:  vancomycin     PRN Meds:.alum & mag hydroxide-simeth, guaiFENesin, hyoscyamine, ipratropium-albuterol, loperamide, LORazepam, magnesium hydroxide, menthol-cetylpyridinium **OR** phenol, metoCLOPramide **OR** metoCLOPramide (REGLAN) injection, morphine CONCENTRATE, polyethylene glycol, traMADol, traZODone    Anti-infectives (From admission, onward)   Start     Dose/Rate Route Frequency Ordered Stop   06/27/18 1345  vancomycin (VANCOCIN) 1,000 mg in  sodium chloride 0.9 % 250 mL IVPB     1,000 mg 250 mL/hr over 60 Minutes Intravenous Every 12 hours 06/27/18 1342 06/28/18 0144   06/27/18 0900  ceFAZolin (ANCEF) IVPB 2g/100 mL premix     2 g 200 mL/hr over 30 Minutes Intravenous To [facility] 06/26/18 2139 06/27/18 1008   06/27/18 0900  vancomycin (VANCOCIN) IVPB 1000 mg/200 mL premix  Status:  Discontinued     1,000 mg 200 mL/hr over 60 Minutes Intravenous On call to O.R. 06/27/18 0846 06/27/18 0851   06/27/18 0845  vancomycin (VANCOCIN) IVPB 1000 mg/200 mL premix     1,000 mg 200 mL/hr over 60 Minutes Intravenous  Once 06/27/18 0839  06/27/18 0958        Objective:   Vitals:   06/27/18 1249 06/27/18 1305 06/27/18 1320 06/27/18 1404  BP: (!) 109/56 (!) 95/52 (!) 98/53 (!) 108/55  Pulse: 80 84 95 86  Resp: 18 (!) 44 11 15  Temp: (!) 97 F (36.1 C)  97.9 F (36.6 C) (!) 97.5 F (36.4 C)  TempSrc:    Oral  SpO2: 100% 100% 93% 100%  Weight:      Height:        Wt Readings from Last 3 Encounters:  06/26/18 40.8 kg  04/04/18 50.4 kg  06/29/16 49.9 kg     Intake/Output Summary (Last 24 hours) at 06/27/2018 1504 Last data filed at 06/27/2018 1244 Gross per 24 hour  Intake 2700 ml  Output 1150 ml  Net 1550 ml    Physical Exam Patient is examined daily including today on 06/26/18 , exams remain the same as of yesterday except that has changed   Gen:- Awake Alert,  In no apparent distress  HEENT:- [place].AT, No sclera icterus Neck-Supple Neck,No JVD,.  Lungs-  CTAB , fair symmetrical air movement CV- S1, S2 normal, regular  Abd-  +ve B.Sounds, Abd Soft, No tenderness,    Extremity/Skin:- No  edema, pedal pulses present , rt Hip postop wound intact Psych-underlying dementia with cognitive deficits, Neuro-no new focal deficits, no tremors GU- foley to be removed on 06/28/18  Data Review:   Micro Results Recent Results (from the past 240 hour(s))  Surgical pcr screen     Status: Abnormal   Collection Time: 06/26/18 10:22 PM  Result Value Ref Range Status   MRSA, PCR POSITIVE (A) NEGATIVE Final    Comment: RESULT CALLED TO, READ BACK BY AND VERIFIED WITH: N CAGUIOA RN 06/27/18 0015 JDW    Staphylococcus aureus POSITIVE (A) NEGATIVE Final    Comment: (NOTE) The Xpert SA Assay (FDA approved for NASAL specimens in patients 4 years of age and older), is one component of a comprehensive surveillance program. It is not intended to diagnose infection nor to guide or monitor treatment. Performed at [facility], [place] [address] [place], [place] [address]     Radiology Reports Dg Chest 1 View  Result  Date: 06/26/2018 CLINICAL DATA:  Fall. EXAM: CHEST  1 VIEW COMPARISON:  04/03/2018 FINDINGS: The heart size and mediastinal contours are within normal limits. There is no evidence of pulmonary edema, consolidation, pneumothorax, nodule or pleural fluid. There appear to be new fractures of the right third, fourth, fifth and sixth ribs demonstrating minimal angulation/displacement. IMPRESSION: New fractures of the right third, fourth, fifth and sixth ribs demonstrating minimal angulation and displacement. No evidence of associated pneumothorax or pleural fluid. Electronically Signed   By: Aletta Edouard M.D.   On: 06/26/2018  16:57   Dg Pelvis 1-2 Views  Result Date: 06/26/2018 CLINICAL DATA:  PER EMS, Pt lives ay [place] and fell in the bathroom and hit the back of her head. Pt also has lac to back of head . Pt reports pain to RT hip . EXAM: PELVIS - 1-2 VIEW COMPARISON:  CT, 09/19/2009. FINDINGS: Fracture of the right femoral neck, subcapital to mid femoral neck, nondisplaced and non comminuted, but with valgus angulation. No other fractures. No bone lesions. Hip joints, SI joints and symphysis pubis are normally spaced and aligned. Skeletal structures are demineralized. Soft tissues are unremarkable. IMPRESSION: 1. Nondisplaced, non comminuted, valgus angulated fracture of the right femoral neck. 2. No other fractures.  No dislocation. Electronically Signed   By: Lajean Manes M.D.   On: 06/26/2018 14:55   Ct Head Wo Contrast  Result Date: 06/26/2018 CLINICAL DATA:  Head trauma, headache. Neck pain. EXAM: CT HEAD WITHOUT CONTRAST CT CERVICAL SPINE WITHOUT CONTRAST TECHNIQUE: Multidetector CT imaging of the head and cervical spine was performed following the standard protocol without intravenous contrast. Multiplanar CT image reconstructions of the cervical spine were also generated. COMPARISON:  04/02/2018 FINDINGS: CT HEAD FINDINGS Brain: No evidence of acute infarction, hemorrhage, hydrocephalus,  extra-axial collection or mass lesion/mass effect. Generalized atrophy. Hypoattenuation of white matter, likely chronic microvascular ischemic change. Chronic LEFT parietal infarct, significant volume loss. Vascular: Calcification of the cavernous internal carotid arteries consistent with cerebrovascular atherosclerotic disease. No signs of intracranial large vessel occlusion. Skull: Calvarium intact. Sinuses/Orbits: No layering sinus fluid. Negative orbits. Other: Large RIGHT parietal scalp hematoma. CT CERVICAL SPINE FINDINGS Alignment: Normal. Skull base and vertebrae: No acute fracture. No primary bone lesion or focal pathologic process. Soft tissues and spinal canal: No prevertebral fluid or swelling. No visible canal hematoma. BILATERAL carotid bifurcation calcification. Disc levels:  No traumatic or calcified disc herniation. Upper chest: Biapical scarring and emphysematous change, greater on the RIGHT, without progression from January 2020 CT chest. Other: None. IMPRESSION: 1. No skull fracture or intracranial hemorrhage. Large RIGHT parietal scalp hematoma. 2. No cervical spine fracture or traumatic subluxation. Electronically Signed   By: Staci Righter M.D.   On: 06/26/2018 13:50   Ct Cervical Spine Wo Contrast  Result Date: 06/26/2018 CLINICAL DATA:  Head trauma, headache. Neck pain. EXAM: CT HEAD WITHOUT CONTRAST CT CERVICAL SPINE WITHOUT CONTRAST TECHNIQUE: Multidetector CT imaging of the head and cervical spine was performed following the standard protocol without intravenous contrast. Multiplanar CT image reconstructions of the cervical spine were also generated. COMPARISON:  04/02/2018 FINDINGS: CT HEAD FINDINGS Brain: No evidence of acute infarction, hemorrhage, hydrocephalus, extra-axial collection or mass lesion/mass effect. Generalized atrophy. Hypoattenuation of white matter, likely chronic microvascular ischemic change. Chronic LEFT parietal infarct, significant volume loss. Vascular:  Calcification of the cavernous internal carotid arteries consistent with cerebrovascular atherosclerotic disease. No signs of intracranial large vessel occlusion. Skull: Calvarium intact. Sinuses/Orbits: No layering sinus fluid. Negative orbits. Other: Large RIGHT parietal scalp hematoma. CT CERVICAL SPINE FINDINGS Alignment: Normal. Skull base and vertebrae: No acute fracture. No primary bone lesion or focal pathologic process. Soft tissues and spinal canal: No prevertebral fluid or swelling. No visible canal hematoma. BILATERAL carotid bifurcation calcification. Disc levels:  No traumatic or calcified disc herniation. Upper chest: Biapical scarring and emphysematous change, greater on the RIGHT, without progression from January 2020 CT chest. Other: None. IMPRESSION: 1. No skull fracture or intracranial hemorrhage. Large RIGHT parietal scalp hematoma. 2. No cervical spine fracture or traumatic subluxation. Electronically  Signed   By: Staci Righter M.D.   On: 06/26/2018 13:50   Pelvis Portable  Result Date: 06/27/2018 CLINICAL DATA:  73 year old female with a history of right femoral neck fracture EXAM: PORTABLE PELVIS 1-2 VIEWS COMPARISON:  Preoperative radiographs 06/26/2018 FINDINGS: A single frontal view of the pelvis demonstrates interval surgical changes of right total hip arthroplasty. A cerclage wire encircles the intertrochanteric region of the femur. Aside from the 2 screws transfixing the acetabular cup, there is a third metallic fragment measuring 2.1 cm in length which resembles a drill bit. Expected subcutaneous emphysema in the soft tissues in the region of incision. IMPRESSION: Surgical changes of right total hip arthroplasty as above. Please see operative note for further detail. Electronically Signed   By: Jacqulynn Cadet M.D.   On: 06/27/2018 13:41   Dg Knee Right Port  Result Date: 06/26/2018 CLINICAL DATA:  Right knee pain after fall. EXAM: PORTABLE RIGHT KNEE - 1-2 VIEW COMPARISON:   None. FINDINGS: No evidence of fracture, dislocation, or joint effusion. No evidence of arthropathy or other focal bone abnormality. Vascular calcifications are noted. IMPRESSION: Negative. Electronically Signed   By: Marijo Conception, M.D.   On: 06/26/2018 16:50   Dg C-arm 1-60 Min  Result Date: 06/27/2018 CLINICAL DATA:  73 year old female with right hip fracture EXAM: DG C-ARM 61-120 MIN; OPERATIVE LEFT HIP WITH PELVIS COMPARISON:  06/26/2010 FINDINGS: Limited intraoperative fluoroscopic spot images during right hip arthroplasty. IMPRESSION: Intraoperative fluoroscopic spot images for right hip arthroplasty. Please refer to the dictated operative report for full details of intraoperative findings and procedure. Electronically Signed   By: Corrie Mckusick D.O.   On: 06/27/2018 12:15   Dg Hip Operative Unilat W Or W/o Pelvis Left  Result Date: 06/27/2018 CLINICAL DATA:  73 year old female with right hip fracture EXAM: DG C-ARM 61-120 MIN; OPERATIVE LEFT HIP WITH PELVIS COMPARISON:  06/26/2010 FINDINGS: Limited intraoperative fluoroscopic spot images during right hip arthroplasty. IMPRESSION: Intraoperative fluoroscopic spot images for right hip arthroplasty. Please refer to the dictated operative report for full details of intraoperative findings and procedure. Electronically Signed   By: Corrie Mckusick D.O.   On: 06/27/2018 12:15   Dg Femur, Min 2 Views Right  Result Date: 06/26/2018 CLINICAL DATA:  PER EMS, Pt lives ay [place] and fell in the bathroom and hit the back of her head. Pt also has lac to back of head . Pt reports pain to RT hip . EXAM: RIGHT FEMUR 2 VIEWS COMPARISON:  None. FINDINGS: Non comminuted fracture of the right femoral neck. No significant fracture displacement. There is valgus fracture angulation. No other fractures.  Hip and knee joints are normally aligned. Soft tissues are unremarkable. IMPRESSION: 1. Nondisplaced, non comminuted, valgus angulated fracture of the right femoral  neck. 2. No other fractures.  No dislocation. Electronically Signed   By: Lajean Manes M.D.   On: 06/26/2018 14:54     CBC Recent Labs  Lab 06/26/18 1506 06/27/18 1134  WBC 23.3*  --   HGB 15.2* 9.5*  HCT 46.1* 28.0*  PLT 398  --   MCV 90.7  --   MCH 29.9  --   MCHC 33.0  --   RDW 13.2  --   LYMPHSABS 2.4  --   MONOABS 1.7*  --   EOSABS 0.1  --   BASOSABS 0.1  --     Chemistries  Recent Labs  Lab 06/26/18 1506 06/27/18 1134  NA 136 135  K 3.9 3.7  CL 99  --   CO2 24  --   GLUCOSE 135* 133*  BUN 7*  --   CREATININE 0.96  --   CALCIUM 9.3  --     Lab Results  Component Value Date   HGBA1C 5.2 02/27/2016   Coagulation profile Recent Labs  Lab 06/26/18 2047  INR 1.0   -----------------------------------------------------------------------------------------------------------------    Component Value Date/Time   BNP 38.4 02/09/2013 1430     Kadijah Shamoon M.D on 06/27/2018 at 3:04 PM  Go to [email or web address] - for contact info  [facility] - Office  [phone]

## 2018-06-27 NOTE — Anesthesia Procedure Notes (Signed)
Procedure Name: Intubation Date/Time: 06/27/2018 10:02 AM Performed by: Neldon Newport, CRNA Pre-anesthesia Checklist: Timeout performed, Patient being monitored, Suction available, Emergency Drugs available and Patient identified Patient Re-evaluated:Patient Re-evaluated prior to induction Oxygen Delivery Method: Circle system utilized Preoxygenation: Pre-oxygenation with 100% oxygen Induction Type: IV induction Ventilation: Mask ventilation without difficulty Laryngoscope Size: Mac and 3 Grade View: Grade I Tube type: Oral Tube size: 7.0 mm Number of attempts: 1 Placement Confirmation: breath sounds checked- equal and bilateral,  positive ETCO2 and ETT inserted through vocal cords under direct vision Secured at: 21 cm Tube secured with: Tape Dental Injury: Teeth and Oropharynx as per pre-operative assessment

## 2018-06-28 DIAGNOSIS — D62 Acute posthemorrhagic anemia: Secondary | ICD-10-CM

## 2018-06-28 DIAGNOSIS — I10 Essential (primary) hypertension: Secondary | ICD-10-CM

## 2018-06-28 LAB — CBC
HCT: 23.3 % — ABNORMAL LOW (ref 36.0–46.0)
Hemoglobin: 7.6 g/dL — ABNORMAL LOW (ref 12.0–15.0)
MCH: 29.3 pg (ref 26.0–34.0)
MCHC: 32.6 g/dL (ref 30.0–36.0)
MCV: 90 fL (ref 80.0–100.0)
Platelets: 163 10*3/uL (ref 150–400)
RBC: 2.59 MIL/uL — ABNORMAL LOW (ref 3.87–5.11)
RDW: 13.3 % (ref 11.5–15.5)
WBC: 19.8 10*3/uL — ABNORMAL HIGH (ref 4.0–10.5)
nRBC: 0 % (ref 0.0–0.2)

## 2018-06-28 LAB — BASIC METABOLIC PANEL
Anion gap: 13 (ref 5–15)
BUN: 10 mg/dL (ref 8–23)
CO2: 17 mmol/L — ABNORMAL LOW (ref 22–32)
Calcium: 7.9 mg/dL — ABNORMAL LOW (ref 8.9–10.3)
Chloride: 102 mmol/L (ref 98–111)
Creatinine, Ser: 1 mg/dL (ref 0.44–1.00)
GFR calc Af Amer: >60 mL/min (ref >60)
GFR calc non Af Amer: 55 mL/min — ABNORMAL LOW (ref >60)
Glucose, Bld: 166 mg/dL — ABNORMAL HIGH (ref 70–99)
Potassium: 3.7 mmol/L (ref 3.5–5.1)
Sodium: 132 mmol/L — ABNORMAL LOW (ref 135–145)

## 2018-06-28 MED ORDER — ENOXAPARIN SODIUM 30 MG/0.3ML ~~LOC~~ SOLN
30.0000 mg | SUBCUTANEOUS | Status: DC
  Administered 2018-06-29 – 2018-07-03 (×5): 30 mg via SUBCUTANEOUS
  Filled 2018-06-28 (×5): qty 0.3

## 2018-06-28 NOTE — Progress Notes (Signed)
Foley d/c this morning at approximately 0630.  No urinary output throughout the day.  Multiple bladder scans performed and patient assisted to the [facility] with still no output.  Maximum volume noted in bladder scans 115ml.  MD Emokpae made aware.  Spoke with patient's daughter [name] over the phone and she stated she does not urinate much regularly.  Will continue to monitor.

## 2018-06-28 NOTE — Evaluation (Signed)
Physical Therapy Evaluation Patient Details Name: [name] MRN: [id number] DOB: [birth year] Today's Date: 06/28/2018   History of Present Illness  [name] is a 73 y.o. female with medical history significant of vascular dementia, prior stroke, HTN, COPD, falls.  Patient resides in ALF;  admitted  with fall, right rib fxs, right femur fx s/p anterior hip arthroplasty.   Clinical Impression  Patient is s/p above surgery resulting in functional limitations due to the deficits listed below (see PT Problem List). Patient was cooperative with supine activities however became adamant (bordering on agitated) when attempted to persuade her to sit at the EOB. Patient reported she was afraid of falling, despite having 2 person assist present. She did pull herself up to long-sitting with min assist which allowed extra linens to be removed and to reposition her away from the left side bed rail. Patient will benefit from skilled PT to increase their independence and safety with mobility to allow discharge to the venue listed below.       Follow Up Recommendations SNF    Equipment Recommendations  None recommended by PT    Recommendations for Other Services       Precautions / Restrictions Precautions Precautions: Fall Restrictions Weight Bearing Restrictions: Yes RLE Weight Bearing: Weight bearing as tolerated      Mobility  Bed Mobility Overal bed mobility: Needs Assistance Bed Mobility: Supine to Sit           General bed mobility comments: pt refused to sit EOB however pulled herself to long-sitting when offered to help her straighten up away from bed rail; maintained long-sitting with min assist x 60 seconds and repositioned in midline/neutral  Transfers                    Ambulation/Gait                Stairs            Wheelchair Mobility    Modified Rankin (Stroke Patients Only)       Balance                                              Pertinent Vitals/Pain Pain Assessment: Faces Faces Pain Scale: No hurt    Home Living Family/patient expects to be discharged to:: Assisted living                 Additional Comments: h/o dementia, unclear home DME    Prior Function           Comments: h/o dementia, unclear     Hand Dominance        Extremity/Trunk Assessment        Lower Extremity Assessment Lower Extremity Assessment: RLE deficits/detail;LLE deficits/detail;Generalized weakness RLE Deficits / Details: AAROM hip flexion to 45 with no grimace, however pt resists further flexion; AAROM ankle DF to neutral, PF 3+ LLE Deficits / Details: AAROM hip & knee WFL; ankle to neutral; ankle PF 3+    Cervical / Trunk Assessment Cervical / Trunk Assessment: Other exceptions Cervical / Trunk Exceptions: pt weight-shifted onto her left hip and shoulder with head nearly against bed rail  Communication   Communication: No difficulties  Cognition Arousal/Alertness: Awake/alert Behavior During Therapy: Agitated(snaps "no" to most requests) Overall Cognitive Status: No family/caregiver present to determine baseline cognitive functioning  General Comments General comments (skin integrity, edema, etc.): elevated bil LEs to float heels     Exercises General Exercises - Lower Extremity Ankle Circles/Pumps: AAROM;AROM;Both;10 reps;Supine Heel Slides: AAROM;Both;5 reps;Supine   Assessment/Plan    PT Assessment Patient needs continued PT services  PT Problem List Decreased strength;Decreased range of motion;Decreased activity tolerance;Decreased balance;Decreased mobility;Decreased cognition;Decreased knowledge of use of DME;Decreased safety awareness       PT Treatment Interventions DME instruction;Gait training;Functional mobility training;Therapeutic activities;Therapeutic exercise;Balance training;Cognitive remediation;Patient/family  education    PT Goals (Current goals can be found in the Care Plan section)  Acute Rehab PT Goals Patient Stated Goal: pt unable to state PT Goal Formulation: Patient unable to participate in goal setting Time For Goal Achievement: 07/12/18 Potential to Achieve Goals: Fair    Frequency Min 3X/week   Barriers to discharge        Co-evaluation               AM-PAC PT "6 Clicks" Mobility  Outcome Measure Help needed turning from your back to your side while in a flat bed without using bedrails?: A Lot Help needed moving from lying on your back to sitting on the side of a flat bed without using bedrails?: Total Help needed moving to and from a bed to a chair (including a wheelchair)?: Total Help needed standing up from a chair using your arms (e.g., wheelchair or bedside chair)?: Total Help needed to walk in hospital room?: Total Help needed climbing 3-5 steps with a railing? : Total 6 Click Score: 7    End of Session Equipment Utilized During Treatment: Oxygen Activity Tolerance: Treatment limited secondary to agitation Patient left: in bed;with call bell/phone within reach;with bed alarm set Nurse Communication: Mobility status(refusal to sit EOB) PT Visit Diagnosis: Repeated falls (R29.6);Muscle weakness (generalized) (M62.81);Difficulty in walking, not elsewhere classified (R26.2)    Time: 1110-1135 PT Time Calculation (min) (ACUTE ONLY): 25 min   Charges:   PT Evaluation $PT Eval Low Complexity: 1 Low PT Treatments $Therapeutic Exercise: 8-22 mins          Rexanne Mano, PT [phone] 06/28/2018, 1:28 PM

## 2018-06-28 NOTE — Evaluation (Signed)
Clinical/Bedside Swallow Evaluation Patient Details  Name: [name] MRN: [id number] Date of Birth: [birth year]  Today's Date: 06/28/2018 Time: SLP Start Time (ACUTE ONLY): 70 SLP Stop Time (ACUTE ONLY): 1350 SLP Time Calculation (min) (ACUTE ONLY): 35 min  Past Medical History:  Past Medical History:  Diagnosis Date  . Carotid artery aneurysm ([place]) 03/04/2012  . COPD (chronic obstructive pulmonary disease) ([place])   . Depression   . DJD (degenerative joint disease)   . GERD (gastroesophageal reflux disease)   . Hypertension   . Stroke [facility])    was on Plavix - not now  . Vascular dementia [facility])    Past Surgical History:  Past Surgical History:  Procedure Laterality Date  . CATARACT EXTRACTION Bilateral   . COLONOSCOPY  2004  . HERNIA REPAIR Right 2014   inguinal  . KYPHOPLASTY N/A 10/28/2014   Procedure: KYPHOPLASTY;  Surgeon: Phylliss Bob, MD;  Location: [place];  Service: Orthopedics;  Laterality: N/A;  Lumbar 1 kyphoplasty  . MICROLARYNGOSCOPY WITH CO2 LASER AND EXCISION OF VOCAL CORD LESION N/A 06/05/2013   Procedure: MICROLARYNGOSCOPY WITH CO2 LASER AND EXCISION OF VOCAL CORD POLYPS;  Surgeon: Melida Quitter, MD;  Location: [place];  Service: ENT;  Laterality: N/A;  . TONSILLECTOMY    . TUBAL LIGATION  30 years  . VIDEO BRONCHOSCOPY  04/24/2012   Procedure: VIDEO BRONCHOSCOPY WITHOUT FLUORO;  Surgeon: Rigoberto Noel, MD;  Location: [place];  Service: Cardiopulmonary;  Laterality: Bilateral;   HPI:  73 year old female admitted 06/26/2018 after a fall at SNF. Pt sustained right femoral neck fracture. Pt underwent surgery 06/27/2018. PMH: carotid artery aneurysm, COPD, depression, DJD, GERD, HTN, CVA, vascular dementia.   Assessment / Plan / Recommendation Clinical Impression  Pt seen at bedside for swallow assessment. Nursing reports pt appears to struggle to swallow. Pt has a history of GERD and COPD, which increase aspiration risk. CN exam unremarkable. Poor dentition noted. Pt  accepted trials of thin liquid, ensure, and puree. After initial boluses, pt exhibited congested cough productive of clear sputum. Following suction of phlegm, pt exhibited no additional s/s aspiration. Recommend continuing with puree diet and thin liquids. Safe swallow precautions posted at [facility], including recommendation for upright position during and for 30 minutes after meals. Given multiple high risk indicators, SLP will follow up at bedside for assessment of diet tolerance and education.   SLP Visit Diagnosis: Dysphagia, unspecified (R13.10)    Aspiration Risk  Mild aspiration risk;Moderate aspiration risk    Diet Recommendation Dysphagia 1 (Puree);Thin liquid   Liquid Administration via: Cup;Straw Medication Administration: (as tolerated) Supervision: Staff to assist with self feeding;Full supervision/cueing for compensatory strategies Compensations: Slow rate;Small sips/bites;Minimize environmental distractions Postural Changes: Seated upright at 90 degrees;Remain upright for at least 30 minutes after po intake    Other  Recommendations Oral Care Recommendations: Oral care BID   Follow up Recommendations 24 hour supervision/assistance      Frequency and Duration min 1 x/week  1 week;2 weeks       Prognosis Prognosis for Safe Diet Advancement: Fair Barriers to Reach Goals: Cognitive deficits      Swallow Study   General Date of Onset: 06/26/18 HPI: 73 year old female admitted 06/26/2018 after a fall at SNF. Pt sustained right femoral neck fracture. Pt underwent surgery 06/27/2018. PMH: carotid artery aneurysm, COPD, depression, DJD, GERD, HTN, CVA, vascular dementia. Type of Study: Bedside Swallow Evaluation Previous Swallow Assessment: BSE 2017 - rec mech soft solids, thin liquids Diet  Prior to this Study: Dysphagia 1 (puree);Thin liquids Temperature Spikes Noted: No Respiratory Status: Nasal cannula History of Recent Intubation: Yes Length of Intubations (days): 1  days Date extubated: 06/27/18 Behavior/Cognition: Alert;Cooperative Oral Cavity Assessment: Within Functional Limits Oral Care Completed by SLP: No Oral Cavity - Dentition: Poor condition;Missing dentition Vision: Functional for self-feeding Self-Feeding Abilities: Needs assist;Needs set up Patient Positioning: Upright in bed Baseline Vocal Quality: Normal Volitional Cough: Congested Volitional Swallow: Able to elicit    Oral/Motor/Sensory Function Overall Oral Motor/Sensory Function: Within functional limits   Ice Chips Ice chips: Not tested   Thin Liquid Thin Liquid: Within functional limits Presentation: Straw    Nectar Thick Nectar Thick Liquid: Within functional limits Presentation: Straw   Honey Thick Honey Thick Liquid: Not tested   Puree Puree: Within functional limits Presentation: Spoon   Solid     Solid: Not tested     [name] [name], [place] Speech Language Pathologist [phone]  Shonna Chock 06/28/2018,2:01 PM

## 2018-06-28 NOTE — Progress Notes (Addendum)
   Subjective: 1 Day Post-Op Procedure(s) (LRB): TOTAL HIP ARTHROPLASTY ANTERIOR APPROACH (Right) Patient reports pain as moderate.   She does not recall having surgery yesterday  Objective: Vital signs in last 24 hours: Temp:  [97 F (36.1 C)-98.4 F (36.9 C)] 98.1 F (36.7 C) (04/04 0534) Pulse Rate:  [71-95] 71 (04/04 0534) Resp:  [11-44] 16 (04/04 0534) BP: (95-118)/(52-71) 107/63 (04/04 0534) SpO2:  [93 %-100 %] 97 % (04/04 0534)  Intake/Output from previous day:  Intake/Output Summary (Last 24 hours) at 06/28/2018 0736 Last data filed at 06/28/2018 0537 Gross per 24 hour  Intake 2442.64 ml  Output 850 ml  Net 1592.64 ml    Intake/Output this shift: No intake/output data recorded.  Labs: Recent Labs    06/26/18 1506 06/27/18 1134 06/28/18 0448  HGB 15.2* 9.5* 7.6*   Recent Labs    06/26/18 1506 06/27/18 1134 06/28/18 0448  WBC 23.3*  --  19.8*  RBC 5.08  --  2.59*  HCT 46.1* 28.0* 23.3*  PLT 398  --  163   Recent Labs    06/26/18 1506 06/27/18 1134 06/28/18 0206  NA 136 135 132*  K 3.9 3.7 3.7  CL 99  --  102  CO2 24  --  17*  BUN 7*  --  10  CREATININE 0.96  --  1.00  GLUCOSE 135* 133* 166*  CALCIUM 9.3  --  7.9*   Recent Labs    06/26/18 2047  INR 1.0    EXAM General - Patient is Alert. Not fully oriented. Doesn't recall having surgery Extremity - Incision: no drainage No cellulitis present Compartment soft Thigh with minimal swelling  Dressing - no drainage Reluctantly wiggles toes  Past Medical History:  Diagnosis Date  . Carotid artery aneurysm ([place]) 03/04/2012  . COPD (chronic obstructive pulmonary disease) ([place])   . Depression   . DJD (degenerative joint disease)   . GERD (gastroesophageal reflux disease)   . Hypertension   . Stroke [facility])    was on Plavix - not now  . Vascular dementia ([place])     Assessment/Plan: 1 Day Post-Op Procedure(s) (LRB): TOTAL HIP ARTHROPLASTY ANTERIOR APPROACH (Right) Principal Problem:  Fracture of femoral neck, right, closed ([name]) Active Problems:   Hospice care patient   Multiple fractures of ribs, right side, initial encounter for closed fracture   Hematoma of right parietal scalp   Leukocytosis   Inadequate pain control   Hip fracture ([name])   Displaced fracture of right femoral neck ([name])   Advance diet Up with therapy  Hemoglobin 7.6 but asymptomatic. Was 15.2  pre-op. Follow hemoglobins for now and consider transfusion if it drops further or if symptoms develop  DVT Prophylaxis - Lovenox Weight Bearing As Tolerated right Leg Hemovac Pulled Begin Therapy  Pilar Plate Hurschel Paynter

## 2018-06-28 NOTE — Progress Notes (Addendum)
Patient Demographics:    [name], is a 73 y.o. female, DOB - [birth year], NFA:[id number]  Admit date - 06/26/2018   Admitting Physician Lady Deutscher, MD  Outpatient Primary MD for the patient is [name], [name], PA-C  LOS - 2   Chief Complaint  Patient presents with   Fall        Subjective:    [name] today has no fevers, no emesis,  No chest pain, forgetful, having difficulties with chewing and swallowing,  Assessment  & Plan :    Principal Problem:   Fracture of femoral neck, right, closed ([place]) Active Problems:   Acute blood loss as cause of postoperative anemia   Essential hypertension   Hospice care patient   Multiple fractures of ribs, right side, initial encounter for closed fracture   Hematoma of right parietal scalp   Leukocytosis   Inadequate pain control   Hip fracture ([place])   Displaced fracture of right femoral neck ([name])  Brief Summary:- 73 y.o. female with medical history significant of COPD, depression, gastroesophageal reflux disease, hypertension, previous thrombotic stroke, and vascular dementia previously under hospice care at [facility] home who was admitted on 06/26/2018 after a fall with closed head injury/hematoma, right-sided rib fractures #3, 4, 5 and 6 and right hip fracture, ??  Dysphagia/swallowing issues  Plan:- 1)Advanced Dementia- Hospice care patient: She is followed by [facility]  hospice out of [place], resides at [facility] home, continue Zyprexa 7.5 mg twice daily, Remeron 30 mg nightly, may use lorazepam as needed agitation/restlessness  2)Rt Hip Fx/right femoral neck fracture--- status post Right total hip arthroplasty on 06/27/18 by Dr. [name] , continue physical therapy  4)Social/Ethics--DNR/DNI, daughter is [name] [phone]  3)Multiple right-sided rib fractures--- ribs 3, 4 5 and 6--- pain control as ordered  4)Dysphagia-- ??   Aspiration risk, swallowing difficulty concerns, change diet to pured diet/dysphagia 1, await speech/swallow evaluation  5) acute blood loss anemia in the postop patient----hemoglobin is down to 7.6 from 9.5 on 06/27/2018... Patient hemoglobin usually between 10 and 11, on admission hemoglobin was 15.2 probably due to hemoconcentration/dehydration... Monitor H&H closely and transfuse as clinically indicated   Disposition/Need for in-Hospital Stay- patient unable to be discharged at this time due to right hip fracture requiring operative correction--awaiting PT evaluation for disposition  Code Status : DNR/DNI  Family Communication:   Daughter ,  daughter is [name] [phone]   PROCEDURE: Right total hip arthroplasty, anterior approach on 06/27/18 By Dr [name]  Disposition Plan  :  SNF  Consults  :  ortho  DVT Prophylaxis  :  Lovenox -    Lab Results  Component Value Date   PLT 163 06/28/2018    Inpatient Medications  Scheduled Meds:  acetaminophen  650 mg Oral Q6H   atorvastatin  20 mg Oral q1800   B-complex with vitamin C  1 tablet Oral Daily   enoxaparin (LOVENOX) injection  40 mg Subcutaneous Q24H   feeding supplement (ENSURE ENLIVE)  237 mL Oral TID BM   gabapentin  300 mg Oral QHS   losartan  100 mg Oral Daily   methocarbamol  500 mg Oral BID   mirtazapine  30 mg Oral QHS   mupirocin ointment  1 application Nasal  BID   OLANZapine  7.5 mg Oral BID   senna-docusate  2 tablet Oral BID   vitamin C  500 mg Oral Daily   Continuous Infusions:  PRN Meds:.alum & mag hydroxide-simeth, guaiFENesin, hyoscyamine, ipratropium-albuterol, loperamide, LORazepam, magnesium hydroxide, menthol-cetylpyridinium **OR** phenol, metoCLOPramide **OR** metoCLOPramide (REGLAN) injection, morphine CONCENTRATE, polyethylene glycol, traMADol, traZODone    Anti-infectives (From admission, onward)   Start     Dose/Rate Route Frequency Ordered Stop   06/27/18 1345  vancomycin  (VANCOCIN) 1,000 mg in sodium chloride 0.9 % 250 mL IVPB     1,000 mg 250 mL/hr over 60 Minutes Intravenous Every 12 hours 06/27/18 1342 06/27/18 1620   06/27/18 0900  ceFAZolin (ANCEF) IVPB 2g/100 mL premix     2 g 200 mL/hr over 30 Minutes Intravenous To ShortStay Surgical 06/26/18 2139 06/27/18 1008   06/27/18 0900  vancomycin (VANCOCIN) IVPB 1000 mg/200 mL premix  Status:  Discontinued     1,000 mg 200 mL/hr over 60 Minutes Intravenous On call to O.R. 06/27/18 0846 06/27/18 0851   06/27/18 0845  vancomycin (VANCOCIN) IVPB 1000 mg/200 mL premix     1,000 mg 200 mL/hr over 60 Minutes Intravenous  Once 06/27/18 0839 06/27/18 0958        Objective:   Vitals:   06/27/18 1404 06/27/18 2050 06/28/18 0113 06/28/18 0534  BP: (!) 108/55 117/71 (!) 118/53 107/63  Pulse: 86 82 75 71  Resp: 15 16 18 16   Temp: (!) 97.5 F (36.4 C) 97.9 F (36.6 C) 98.4 F (36.9 C) 98.1 F (36.7 C)  TempSrc: Oral Oral Oral Oral  SpO2: 100% 100% 100% 97%  Weight:      Height:        Wt Readings from Last 3 Encounters:  06/26/18 40.8 kg  04/04/18 50.4 kg  06/29/16 49.9 kg     Intake/Output Summary (Last 24 hours) at 06/28/2018 1016 Last data filed at 06/28/2018 1009 Gross per 24 hour  Intake 2979.64 ml  Output 850 ml  Net 2129.64 ml    Physical Exam Patient is examined daily including today on 06/28/18 , exams remain the same as of yesterday except that has changed   Gen:- Awake Alert,  In no apparent distress  HEENT:- Muddy.AT, No sclera icterus Neck-Supple Neck,No JVD,.  Lungs-  CTAB , fair symmetrical air movement CV- S1, S2 normal, regular  Abd-  +ve B.Sounds, Abd Soft, No tenderness,    Extremity/Skin:- No  edema, pedal pulses present , rt Hip postop wound intact Psych-underlying dementia with cognitive deficits, patient is forgetful Neuro-no new focal deficits, no tremors   Data Review:   Micro Results Recent Results (from the past 240 hour(s))  Surgical pcr screen     Status:  Abnormal   Collection Time: 06/26/18 10:22 PM  Result Value Ref Range Status   MRSA, PCR POSITIVE (A) NEGATIVE Final    Comment: RESULT CALLED TO, READ BACK BY AND VERIFIED WITH: N CAGUIOA RN 06/27/18 0015 JDW    Staphylococcus aureus POSITIVE (A) NEGATIVE Final    Comment: (NOTE) The Xpert SA Assay (FDA approved for NASAL specimens in patients 75 years of age and older), is one component of a comprehensive surveillance program. It is not intended to diagnose infection nor to guide or monitor treatment. Performed at [facility], [place] [address] [place], [place] [address]     Radiology Reports Dg Chest 1 View  Result Date: 06/26/2018 CLINICAL DATA:  Fall. EXAM: CHEST  1 VIEW COMPARISON:  04/03/2018 FINDINGS: The heart size and mediastinal contours are within normal limits. There is no evidence of pulmonary edema, consolidation, pneumothorax, nodule or pleural fluid. There appear to be new fractures of the right third, fourth, fifth and sixth ribs demonstrating minimal angulation/displacement. IMPRESSION: New fractures of the right third, fourth, fifth and sixth ribs demonstrating minimal angulation and displacement. No evidence of associated pneumothorax or pleural fluid. Electronically Signed   By: Aletta Edouard M.D.   On: 06/26/2018 16:57   Dg Pelvis 1-2 Views  Result Date: 06/26/2018 CLINICAL DATA:  PER EMS, Pt lives ay [place] and fell in the bathroom and hit the back of her head. Pt also has lac to back of head . Pt reports pain to RT hip . EXAM: PELVIS - 1-2 VIEW COMPARISON:  CT, 09/19/2009. FINDINGS: Fracture of the right femoral neck, subcapital to mid femoral neck, nondisplaced and non comminuted, but with valgus angulation. No other fractures. No bone lesions. Hip joints, SI joints and symphysis pubis are normally spaced and aligned. Skeletal structures are demineralized. Soft tissues are unremarkable. IMPRESSION: 1. Nondisplaced, non comminuted, valgus angulated  fracture of the right femoral neck. 2. No other fractures.  No dislocation. Electronically Signed   By: Lajean Manes M.D.   On: 06/26/2018 14:55   Ct Head Wo Contrast  Result Date: 06/26/2018 CLINICAL DATA:  Head trauma, headache. Neck pain. EXAM: CT HEAD WITHOUT CONTRAST CT CERVICAL SPINE WITHOUT CONTRAST TECHNIQUE: Multidetector CT imaging of the head and cervical spine was performed following the standard protocol without intravenous contrast. Multiplanar CT image reconstructions of the cervical spine were also generated. COMPARISON:  04/02/2018 FINDINGS: CT HEAD FINDINGS Brain: No evidence of acute infarction, hemorrhage, hydrocephalus, extra-axial collection or mass lesion/mass effect. Generalized atrophy. Hypoattenuation of white matter, likely chronic microvascular ischemic change. Chronic LEFT parietal infarct, significant volume loss. Vascular: Calcification of the cavernous internal carotid arteries consistent with cerebrovascular atherosclerotic disease. No signs of intracranial large vessel occlusion. Skull: Calvarium intact. Sinuses/Orbits: No layering sinus fluid. Negative orbits. Other: Large RIGHT parietal scalp hematoma. CT CERVICAL SPINE FINDINGS Alignment: Normal. Skull base and vertebrae: No acute fracture. No primary bone lesion or focal pathologic process. Soft tissues and spinal canal: No prevertebral fluid or swelling. No visible canal hematoma. BILATERAL carotid bifurcation calcification. Disc levels:  No traumatic or calcified disc herniation. Upper chest: Biapical scarring and emphysematous change, greater on the RIGHT, without progression from January 2020 CT chest. Other: None. IMPRESSION: 1. No skull fracture or intracranial hemorrhage. Large RIGHT parietal scalp hematoma. 2. No cervical spine fracture or traumatic subluxation. Electronically Signed   By: Staci Righter M.D.   On: 06/26/2018 13:50   Ct Cervical Spine Wo Contrast  Result Date: 06/26/2018 CLINICAL DATA:  Head  trauma, headache. Neck pain. EXAM: CT HEAD WITHOUT CONTRAST CT CERVICAL SPINE WITHOUT CONTRAST TECHNIQUE: Multidetector CT imaging of the head and cervical spine was performed following the standard protocol without intravenous contrast. Multiplanar CT image reconstructions of the cervical spine were also generated. COMPARISON:  04/02/2018 FINDINGS: CT HEAD FINDINGS Brain: No evidence of acute infarction, hemorrhage, hydrocephalus, extra-axial collection or mass lesion/mass effect. Generalized atrophy. Hypoattenuation of white matter, likely chronic microvascular ischemic change. Chronic LEFT parietal infarct, significant volume loss. Vascular: Calcification of the cavernous internal carotid arteries consistent with cerebrovascular atherosclerotic disease. No signs of intracranial large vessel occlusion. Skull: Calvarium intact. Sinuses/Orbits: No layering sinus fluid. Negative orbits. Other: Large RIGHT parietal scalp hematoma. CT CERVICAL SPINE FINDINGS Alignment: Normal. Skull base and  vertebrae: No acute fracture. No primary bone lesion or focal pathologic process. Soft tissues and spinal canal: No prevertebral fluid or swelling. No visible canal hematoma. BILATERAL carotid bifurcation calcification. Disc levels:  No traumatic or calcified disc herniation. Upper chest: Biapical scarring and emphysematous change, greater on the RIGHT, without progression from January 2020 CT chest. Other: None. IMPRESSION: 1. No skull fracture or intracranial hemorrhage. Large RIGHT parietal scalp hematoma. 2. No cervical spine fracture or traumatic subluxation. Electronically Signed   By: Staci Righter M.D.   On: 06/26/2018 13:50   Pelvis Portable  Result Date: 06/27/2018 CLINICAL DATA:  73 year old female with a history of right femoral neck fracture EXAM: PORTABLE PELVIS 1-2 VIEWS COMPARISON:  Preoperative radiographs 06/26/2018 FINDINGS: A single frontal view of the pelvis demonstrates interval surgical changes of right  total hip arthroplasty. A cerclage wire encircles the intertrochanteric region of the femur. Aside from the 2 screws transfixing the acetabular cup, there is a third metallic fragment measuring 2.1 cm in length which resembles a drill bit. Expected subcutaneous emphysema in the soft tissues in the region of incision. IMPRESSION: Surgical changes of right total hip arthroplasty as above. Please see operative note for further detail. Electronically Signed   By: Jacqulynn Cadet M.D.   On: 06/27/2018 13:41   Dg Knee Right Port  Result Date: 06/26/2018 CLINICAL DATA:  Right knee pain after fall. EXAM: PORTABLE RIGHT KNEE - 1-2 VIEW COMPARISON:  None. FINDINGS: No evidence of fracture, dislocation, or joint effusion. No evidence of arthropathy or other focal bone abnormality. Vascular calcifications are noted. IMPRESSION: Negative. Electronically Signed   By: Marijo Conception, M.D.   On: 06/26/2018 16:50   Dg C-arm 1-60 Min  Result Date: 06/27/2018 CLINICAL DATA:  73 year old female with right hip fracture EXAM: DG C-ARM 61-120 MIN; OPERATIVE LEFT HIP WITH PELVIS COMPARISON:  06/26/2010 FINDINGS: Limited intraoperative fluoroscopic spot images during right hip arthroplasty. IMPRESSION: Intraoperative fluoroscopic spot images for right hip arthroplasty. Please refer to the dictated operative report for full details of intraoperative findings and procedure. Electronically Signed   By: Corrie Mckusick D.O.   On: 06/27/2018 12:15   Dg Hip Operative Unilat W Or W/o Pelvis Left  Result Date: 06/27/2018 CLINICAL DATA:  73 year old female with right hip fracture EXAM: DG C-ARM 61-120 MIN; OPERATIVE LEFT HIP WITH PELVIS COMPARISON:  06/26/2010 FINDINGS: Limited intraoperative fluoroscopic spot images during right hip arthroplasty. IMPRESSION: Intraoperative fluoroscopic spot images for right hip arthroplasty. Please refer to the dictated operative report for full details of intraoperative findings and procedure.  Electronically Signed   By: Corrie Mckusick D.O.   On: 06/27/2018 12:15   Dg Femur, Min 2 Views Right  Result Date: 06/26/2018 CLINICAL DATA:  PER EMS, Pt lives ay [place] and fell in the bathroom and hit the back of her head. Pt also has lac to back of head . Pt reports pain to RT hip . EXAM: RIGHT FEMUR 2 VIEWS COMPARISON:  None. FINDINGS: Non comminuted fracture of the right femoral neck. No significant fracture displacement. There is valgus fracture angulation. No other fractures.  Hip and knee joints are normally aligned. Soft tissues are unremarkable. IMPRESSION: 1. Nondisplaced, non comminuted, valgus angulated fracture of the right femoral neck. 2. No other fractures.  No dislocation. Electronically Signed   By: Lajean Manes M.D.   On: 06/26/2018 14:54     CBC Recent Labs  Lab 06/26/18 1506 06/27/18 1134 06/28/18 0448  WBC 23.3*  --  19.8*  HGB 15.2* 9.5* 7.6*  HCT 46.1* 28.0* 23.3*  PLT 398  --  163  MCV 90.7  --  90.0  MCH 29.9  --  29.3  MCHC 33.0  --  32.6  RDW 13.2  --  13.3  LYMPHSABS 2.4  --   --   MONOABS 1.7*  --   --   EOSABS 0.1  --   --   BASOSABS 0.1  --   --     Chemistries  Recent Labs  Lab 06/26/18 1506 06/27/18 1134 06/28/18 0206  NA 136 135 132*  K 3.9 3.7 3.7  CL 99  --  102  CO2 24  --  17*  GLUCOSE 135* 133* 166*  BUN 7*  --  10  CREATININE 0.96  --  1.00  CALCIUM 9.3  --  7.9*    Lab Results  Component Value Date   HGBA1C 5.2 02/27/2016   Coagulation profile Recent Labs  Lab 06/26/18 2047  INR 1.0   -----------------------------------------------------------------------------------------------------------------    Component Value Date/Time   BNP 38.4 02/09/2013 1430     Adelyna Brockman M.D on 06/28/2018 at 10:16 AM  Go to [email or web address] - for contact info  [facility] - Office  [phone]

## 2018-06-28 NOTE — Plan of Care (Signed)
  Problem: Clinical Measurements: Goal: Postoperative complications will be avoided or minimized Outcome: Progressing   Problem: Pain Management: Goal: Pain level will decrease Outcome: Progressing   Problem: Clinical Measurements: Goal: Ability to maintain clinical measurements within normal limits will improve Outcome: Progressing Goal: Will remain free from infection Outcome: Progressing   Problem: Activity: Goal: Risk for activity intolerance will decrease Outcome: Progressing   Problem: Nutrition: Goal: Adequate nutrition will be maintained Outcome: Progressing   Problem: Coping: Goal: Level of anxiety will decrease Outcome: Progressing   Problem: Elimination: Goal: Will not experience complications related to bowel motility Outcome: Progressing Goal: Will not experience complications related to urinary retention Outcome: Progressing   Problem: Pain Managment: Goal: General experience of comfort will improve Outcome: Progressing

## 2018-06-28 NOTE — Progress Notes (Signed)
Pt has not voided after foley d/c today at 0630. Bladder scan noted 114ml. Text page X. Blount,NP received order to do In and Out cath greater than 355ml residual. Will continue to monitor.

## 2018-06-29 LAB — CBC
HCT: 22.2 % — ABNORMAL LOW (ref 36.0–46.0)
Hemoglobin: 7.3 g/dL — ABNORMAL LOW (ref 12.0–15.0)
MCH: 29.1 pg (ref 26.0–34.0)
MCHC: 32.9 g/dL (ref 30.0–36.0)
MCV: 88.4 fL (ref 80.0–100.0)
Platelets: 181 10*3/uL (ref 150–400)
RBC: 2.51 MIL/uL — ABNORMAL LOW (ref 3.87–5.11)
RDW: 13.7 % (ref 11.5–15.5)
WBC: 15.1 10*3/uL — ABNORMAL HIGH (ref 4.0–10.5)
nRBC: 0 % (ref 0.0–0.2)

## 2018-06-29 MED ORDER — SODIUM CHLORIDE 0.9 % IV BOLUS
500.0000 mL | Freq: Once | INTRAVENOUS | Status: AC
  Administered 2018-06-29: 500 mL via INTRAVENOUS

## 2018-06-29 MED ORDER — TAMSULOSIN HCL 0.4 MG PO CAPS
0.4000 mg | ORAL_CAPSULE | Freq: Every day | ORAL | Status: DC
  Administered 2018-06-29 – 2018-07-01 (×3): 0.4 mg via ORAL
  Filled 2018-06-29 (×3): qty 1

## 2018-06-29 NOTE — Progress Notes (Signed)
Subjective: 2 Days Post-Op Procedure(s) (LRB): TOTAL HIP ARTHROPLASTY ANTERIOR APPROACH (Right) Patient reports pain as mild to right hip.  Limited with PT. Tolerating PO's. Denies dizziness. Denies SOB or CP.   Objective: Vital signs in last 24 hours: Temp:  [98 F (36.7 C)-99.4 F (37.4 C)] 98.5 F (36.9 C) (04/05 0741) Pulse Rate:  [101-124] 107 (04/05 0741) Resp:  [18-28] 18 (04/05 0741) BP: (87-113)/(53-92) 107/66 (04/05 0741) SpO2:  [98 %-100 %] 100 % (04/05 0741)  Intake/Output from previous day: 04/04 0701 - 04/05 0700 In: 1377.9 [P.O.:1237; IV Piggyback:140.9] Out: 260 [Urine:260] Intake/Output this shift: No intake/output data recorded.  Recent Labs    06/26/18 1506 06/27/18 1134 06/28/18 0448 06/29/18 0222  HGB 15.2* 9.5* 7.6* 7.3*   Recent Labs    06/28/18 0448 06/29/18 0222  WBC 19.8* 15.1*  RBC 2.59* 2.51*  HCT 23.3* 22.2*  PLT 163 181   Recent Labs    06/26/18 1506 06/27/18 1134 06/28/18 0206  NA 136 135 132*  K 3.9 3.7 3.7  CL 99  --  102  CO2 24  --  17*  BUN 7*  --  10  CREATININE 0.96  --  1.00  GLUCOSE 135* 133* 166*  CALCIUM 9.3  --  7.9*   Recent Labs    06/26/18 2047  INR 1.0    Well nourished. Alert and oriented x3. RRR, Lungs clear, BS x4. Abdomen soft and non tender. Right Calf soft and non tender. Right hip dressing C/D/I. No DVT signs. Compartment soft. No signs of infection.  Right LE grossly neurovascular intact.   Assessment/Plan: 2 Days Post-Op Procedure(s) (LRB): TOTAL HIP ARTHROPLASTY ANTERIOR APPROACH (Right) Up with PT WBAT Advance diet D/c planning Monitor HGB 7.3 today from 7.6 and asymtpomatic    Marianne Golightly L Trent Gabler 06/29/2018, 8:38 AM

## 2018-06-29 NOTE — Progress Notes (Signed)
Pt noted with low  BP 87/53 and pulse 106. Text page X.Blount,NP with new order to give NS bolus 563ml.

## 2018-06-29 NOTE — Progress Notes (Signed)
PT Cancellation Note  Patient Details Name: [name] MRN: [id number] DOB: April 03, 1945   Cancelled Treatment:    Reason Eval/Treat Not Completed: Fatigue/lethargy limiting ability to participate;Patient declined, no reason specified. Pt politely refusing all PT at this time. Not agreeable to bed ex's or EOB due to "not feeling well today". Acute PT to follow up at a later date.   Willow Ora, PTA, [place] [phone] 06/29/18, 1:42 PM   Willow Ora 06/29/2018, 1:41 PM

## 2018-06-29 NOTE — Progress Notes (Signed)
Patient Demographics:    [name], is a 73 y.o. female, DOB - 1945/11/09, IWL:[id number]  Admit date - 06/26/2018   Admitting Physician Lady Deutscher, MD  Outpatient Primary MD for the patient is [name], [name], PA-C  LOS - 3   Chief Complaint  Patient presents with   Fall        Subjective:    [name] today has no fevers, no emesis,  No chest pain, forgetful, urinary retention or swallowing concerns persist  Assessment  & Plan :    Principal Problem:   Fracture of femoral neck, right, closed ([place]) Active Problems:   Acute blood loss as cause of postoperative anemia   Essential hypertension   Hospice care patient   Multiple fractures of ribs, right side, initial encounter for closed fracture   Hematoma of right parietal scalp   Leukocytosis   Inadequate pain control   Hip fracture ([place])   Displaced fracture of right femoral neck ([name])  Brief Summary:- 73 y.o. female with medical history significant of COPD, depression, gastroesophageal reflux disease, hypertension, previous thrombotic stroke, and vascular dementia previously under hospice care at [facility] home who was admitted on 06/26/2018 after a fall with closed head injury/hematoma, right-sided rib fractures #3, 4, 5 and 6 and right hip fracture, ??  Dysphagia/swallowing issues  Plan:- 1)Advanced Dementia- Hospice care patient: She is followed by [facility]  hospice out of [place], resides at [facility] home, continue Zyprexa 7.5 mg twice daily, Remeron 30 mg nightly, may use lorazepam as needed agitation/restlessness  2)Rt Hip Fx/right femoral neck fracture--- status post Right total hip arthroplasty on 06/27/18 by Dr. [name] , continue physical therapy  4)Social/Ethics--DNR/DNI, daughter is [name] [phone]  3)Multiple right-sided rib fractures--- ribs 3, 4 5 and 6--- pain control as ordered  4)Dysphagia-- ??   Aspiration risk, swallowing difficulty concerns, continue pured diet/dysphagia 1,  speech/swallow evaluation appreciated,  5) acute blood loss anemia in the postop patient----hemoglobin is down to 7.3 from 9.5 on 06/27/2018... Patient hemoglobin usually between 10 and 11, on admission hemoglobin was 15.2 probably due to hemoconcentration/dehydration... Monitor H&H closely and transfuse as clinically indicated   6) acute urinary retention in the postop patient --- as per patient's daughter in the past patient has had recurrent episodes of urinary retention, give Flomax 0.4 daily   Disposition/Need for in-Hospital Stay- patient unable to be discharged at this time due to right hip fracture requiring operative correction--awaiting PT evaluation for disposition  Code Status : DNR/DNI  Family Communication:   Daughter ,  daughter is [name] [phone]   PROCEDURE: Right total hip arthroplasty, anterior approach on 06/27/18 By Dr [name]  Disposition Plan  :  SNF  Consults  :  ortho  DVT Prophylaxis  :  Lovenox -    Lab Results  Component Value Date   PLT 181 06/29/2018    Inpatient Medications  Scheduled Meds:  acetaminophen  650 mg Oral Q6H   atorvastatin  20 mg Oral q1800   B-complex with vitamin C  1 tablet Oral Daily   enoxaparin (LOVENOX) injection  30 mg Subcutaneous Q24H   feeding supplement (ENSURE ENLIVE)  237 mL Oral TID BM   gabapentin  300 mg Oral QHS   losartan  100 mg Oral Daily   methocarbamol  500 mg Oral BID   mirtazapine  30 mg Oral QHS   mupirocin ointment  1 application Nasal BID   OLANZapine  7.5 mg Oral BID   senna-docusate  2 tablet Oral BID   tamsulosin  0.4 mg Oral QPC supper   vitamin C  500 mg Oral Daily   Continuous Infusions:  PRN Meds:.alum & mag hydroxide-simeth, guaiFENesin, hyoscyamine, ipratropium-albuterol, loperamide, LORazepam, magnesium hydroxide, menthol-cetylpyridinium **OR** phenol, metoCLOPramide **OR**  metoCLOPramide (REGLAN) injection, morphine CONCENTRATE, polyethylene glycol, traMADol, traZODone    Anti-infectives (From admission, onward)   Start     Dose/Rate Route Frequency Ordered Stop   06/27/18 1345  vancomycin (VANCOCIN) 1,000 mg in sodium chloride 0.9 % 250 mL IVPB     1,000 mg 250 mL/hr over 60 Minutes Intravenous Every 12 hours 06/27/18 1342 06/27/18 1620   06/27/18 0900  ceFAZolin (ANCEF) IVPB 2g/100 mL premix     2 g 200 mL/hr over 30 Minutes Intravenous To ShortStay [facility] 06/26/18 2139 06/27/18 1008   06/27/18 0900  vancomycin (VANCOCIN) IVPB 1000 mg/200 mL premix  Status:  Discontinued     1,000 mg 200 mL/hr over 60 Minutes Intravenous On call to O.R. 06/27/18 0846 06/27/18 0851   06/27/18 0845  vancomycin (VANCOCIN) IVPB 1000 mg/200 mL premix     1,000 mg 200 mL/hr over 60 Minutes Intravenous  Once 06/27/18 0839 06/27/18 0958        Objective:   Vitals:   06/29/18 0427 06/29/18 0441 06/29/18 0614 06/29/18 0741  BP: (!) 92/57 (!) 87/53 98/60 107/66  Pulse: (!) 109 (!) 106 (!) 105 (!) 107  Resp: 18   18  Temp: 98.6 F (37 C)   98.5 F (36.9 C)  TempSrc: Oral   Axillary  SpO2: 99%   100%  Weight:      Height:        Wt Readings from Last 3 Encounters:  06/26/18 40.8 kg  04/04/18 50.4 kg  06/29/16 49.9 kg     Intake/Output Summary (Last 24 hours) at 06/29/2018 0953 Last data filed at 06/29/2018 7616 Gross per 24 hour  Intake 1377.92 ml  Output 260 ml  Net 1117.92 ml    Physical Exam Patient is examined daily including today on 06/29/18 , exams remain the same as of yesterday except that has changed   Gen:- Awake Alert,  In no apparent distress  HEENT:- [place].AT, No sclera icterus Nose- [place] 2 L/min Neck-Supple Neck,No JVD,.  Lungs-  CTAB , fair symmetrical air movement CV- S1, S2 normal, regular  Abd-  +ve B.Sounds, Abd Soft, No tenderness,    Extremity/Skin:- No  edema, pedal pulses present , rt Hip postop wound intact Psych-underlying dementia  with cognitive deficits, patient is forgetful Neuro-generalized weakness, no new focal deficits, no tremors   Data Review:   Micro Results Recent Results (from the past 240 hour(s))  Surgical pcr screen     Status: Abnormal   Collection Time: 06/26/18 10:22 PM  Result Value Ref Range Status   MRSA, PCR POSITIVE (A) NEGATIVE Final    Comment: RESULT CALLED TO, READ BACK BY AND VERIFIED WITH: N CAGUIOA RN 06/27/18 0015 JDW    Staphylococcus aureus POSITIVE (A) NEGATIVE Final    Comment: (NOTE) The Xpert SA Assay (FDA approved for NASAL specimens in patients 68 years of age and older), is one component of a comprehensive surveillance program. It is not intended to diagnose infection nor to guide  or monitor treatment. Performed at [facility], [place] [address], [place], [place] [address]     Radiology Reports Dg Chest 1 View  Result Date: 06/26/2018 CLINICAL DATA:  Fall. EXAM: CHEST  1 VIEW COMPARISON:  04/03/2018 FINDINGS: The heart size and mediastinal contours are within normal limits. There is no evidence of pulmonary edema, consolidation, pneumothorax, nodule or pleural fluid. There appear to be new fractures of the right third, fourth, fifth and sixth ribs demonstrating minimal angulation/displacement. IMPRESSION: New fractures of the right third, fourth, fifth and sixth ribs demonstrating minimal angulation and displacement. No evidence of associated pneumothorax or pleural fluid. Electronically Signed   By: Aletta Edouard M.D.   On: 06/26/2018 16:57   Dg Pelvis 1-2 Views  Result Date: 06/26/2018 CLINICAL DATA:  PER EMS, Pt lives ay [place] and fell in the bathroom and hit the back of her head. Pt also has lac to back of head . Pt reports pain to RT hip . EXAM: PELVIS - 1-2 VIEW COMPARISON:  CT, 09/19/2009. FINDINGS: Fracture of the right femoral neck, subcapital to mid femoral neck, nondisplaced and non comminuted, but with valgus angulation. No other fractures. No bone  lesions. Hip joints, SI joints and symphysis pubis are normally spaced and aligned. Skeletal structures are demineralized. Soft tissues are unremarkable. IMPRESSION: 1. Nondisplaced, non comminuted, valgus angulated fracture of the right femoral neck. 2. No other fractures.  No dislocation. Electronically Signed   By: Lajean Manes M.D.   On: 06/26/2018 14:55   Ct Head Wo Contrast  Result Date: 06/26/2018 CLINICAL DATA:  Head trauma, headache. Neck pain. EXAM: CT HEAD WITHOUT CONTRAST CT CERVICAL SPINE WITHOUT CONTRAST TECHNIQUE: Multidetector CT imaging of the head and cervical spine was performed following the standard protocol without intravenous contrast. Multiplanar CT image reconstructions of the cervical spine were also generated. COMPARISON:  04/02/2018 FINDINGS: CT HEAD FINDINGS Brain: No evidence of acute infarction, hemorrhage, hydrocephalus, extra-axial collection or mass lesion/mass effect. Generalized atrophy. Hypoattenuation of white matter, likely chronic microvascular ischemic change. Chronic LEFT parietal infarct, significant volume loss. Vascular: Calcification of the cavernous internal carotid arteries consistent with cerebrovascular atherosclerotic disease. No signs of intracranial large vessel occlusion. Skull: Calvarium intact. Sinuses/Orbits: No layering sinus fluid. Negative orbits. Other: Large RIGHT parietal scalp hematoma. CT CERVICAL SPINE FINDINGS Alignment: Normal. Skull base and vertebrae: No acute fracture. No primary bone lesion or focal pathologic process. Soft tissues and spinal canal: No prevertebral fluid or swelling. No visible canal hematoma. BILATERAL carotid bifurcation calcification. Disc levels:  No traumatic or calcified disc herniation. Upper chest: Biapical scarring and emphysematous change, greater on the RIGHT, without progression from January 2020 CT chest. Other: None. IMPRESSION: 1. No skull fracture or intracranial hemorrhage. Large RIGHT parietal scalp  hematoma. 2. No cervical spine fracture or traumatic subluxation. Electronically Signed   By: Staci Righter M.D.   On: 06/26/2018 13:50   Ct Cervical Spine Wo Contrast  Result Date: 06/26/2018 CLINICAL DATA:  Head trauma, headache. Neck pain. EXAM: CT HEAD WITHOUT CONTRAST CT CERVICAL SPINE WITHOUT CONTRAST TECHNIQUE: Multidetector CT imaging of the head and cervical spine was performed following the standard protocol without intravenous contrast. Multiplanar CT image reconstructions of the cervical spine were also generated. COMPARISON:  04/02/2018 FINDINGS: CT HEAD FINDINGS Brain: No evidence of acute infarction, hemorrhage, hydrocephalus, extra-axial collection or mass lesion/mass effect. Generalized atrophy. Hypoattenuation of white matter, likely chronic microvascular ischemic change. Chronic LEFT parietal infarct, significant volume loss. Vascular: Calcification of the  cavernous internal carotid arteries consistent with cerebrovascular atherosclerotic disease. No signs of intracranial large vessel occlusion. Skull: Calvarium intact. Sinuses/Orbits: No layering sinus fluid. Negative orbits. Other: Large RIGHT parietal scalp hematoma. CT CERVICAL SPINE FINDINGS Alignment: Normal. Skull base and vertebrae: No acute fracture. No primary bone lesion or focal pathologic process. Soft tissues and spinal canal: No prevertebral fluid or swelling. No visible canal hematoma. BILATERAL carotid bifurcation calcification. Disc levels:  No traumatic or calcified disc herniation. Upper chest: Biapical scarring and emphysematous change, greater on the RIGHT, without progression from January 2020 CT chest. Other: None. IMPRESSION: 1. No skull fracture or intracranial hemorrhage. Large RIGHT parietal scalp hematoma. 2. No cervical spine fracture or traumatic subluxation. Electronically Signed   By: Staci Righter M.D.   On: 06/26/2018 13:50   Pelvis Portable  Result Date: 06/27/2018 CLINICAL DATA:  73 year old female with a  history of right femoral neck fracture EXAM: PORTABLE PELVIS 1-2 VIEWS COMPARISON:  Preoperative radiographs 06/26/2018 FINDINGS: A single frontal view of the pelvis demonstrates interval surgical changes of right total hip arthroplasty. A cerclage wire encircles the intertrochanteric region of the femur. Aside from the 2 screws transfixing the acetabular cup, there is a third metallic fragment measuring 2.1 cm in length which resembles a drill bit. Expected subcutaneous emphysema in the soft tissues in the region of incision. IMPRESSION: Surgical changes of right total hip arthroplasty as above. Please see operative note for further detail. Electronically Signed   By: Jacqulynn Cadet M.D.   On: 06/27/2018 13:41   Dg Knee Right Port  Result Date: 06/26/2018 CLINICAL DATA:  Right knee pain after fall. EXAM: PORTABLE RIGHT KNEE - 1-2 VIEW COMPARISON:  None. FINDINGS: No evidence of fracture, dislocation, or joint effusion. No evidence of arthropathy or other focal bone abnormality. Vascular calcifications are noted. IMPRESSION: Negative. Electronically Signed   By: Marijo Conception, M.D.   On: 06/26/2018 16:50   Dg C-arm 1-60 Min  Result Date: 06/27/2018 CLINICAL DATA:  73 year old female with right hip fracture EXAM: DG C-ARM 61-120 MIN; OPERATIVE LEFT HIP WITH PELVIS COMPARISON:  06/26/2010 FINDINGS: Limited intraoperative fluoroscopic spot images during right hip arthroplasty. IMPRESSION: Intraoperative fluoroscopic spot images for right hip arthroplasty. Please refer to the dictated operative report for full details of intraoperative findings and procedure. Electronically Signed   By: Corrie Mckusick D.O.   On: 06/27/2018 12:15   Dg Hip Operative Unilat W Or W/o Pelvis Left  Result Date: 06/27/2018 CLINICAL DATA:  73 year old female with right hip fracture EXAM: DG C-ARM 61-120 MIN; OPERATIVE LEFT HIP WITH PELVIS COMPARISON:  06/26/2010 FINDINGS: Limited intraoperative fluoroscopic spot images during right  hip arthroplasty. IMPRESSION: Intraoperative fluoroscopic spot images for right hip arthroplasty. Please refer to the dictated operative report for full details of intraoperative findings and procedure. Electronically Signed   By: Corrie Mckusick D.O.   On: 06/27/2018 12:15   Dg Femur, Min 2 Views Right  Result Date: 06/26/2018 CLINICAL DATA:  PER EMS, Pt lives ay [place] and fell in the bathroom and hit the back of her head. Pt also has lac to back of head . Pt reports pain to RT hip . EXAM: RIGHT FEMUR 2 VIEWS COMPARISON:  None. FINDINGS: Non comminuted fracture of the right femoral neck. No significant fracture displacement. There is valgus fracture angulation. No other fractures.  Hip and knee joints are normally aligned. Soft tissues are unremarkable. IMPRESSION: 1. Nondisplaced, non comminuted, valgus angulated fracture of the right femoral neck. 2.  No other fractures.  No dislocation. Electronically Signed   By: Lajean Manes M.D.   On: 06/26/2018 14:54     CBC Recent Labs  Lab 06/26/18 1506 06/27/18 1134 06/28/18 0448 06/29/18 0222  WBC 23.3*  --  19.8* 15.1*  HGB 15.2* 9.5* 7.6* 7.3*  HCT 46.1* 28.0* 23.3* 22.2*  PLT 398  --  163 181  MCV 90.7  --  90.0 88.4  MCH 29.9  --  29.3 29.1  MCHC 33.0  --  32.6 32.9  RDW 13.2  --  13.3 13.7  LYMPHSABS 2.4  --   --   --   MONOABS 1.7*  --   --   --   EOSABS 0.1  --   --   --   BASOSABS 0.1  --   --   --     Chemistries  Recent Labs  Lab 06/26/18 1506 06/27/18 1134 06/28/18 0206  NA 136 135 132*  K 3.9 3.7 3.7  CL 99  --  102  CO2 24  --  17*  GLUCOSE 135* 133* 166*  BUN 7*  --  10  CREATININE 0.96  --  1.00  CALCIUM 9.3  --  7.9*    Lab Results  Component Value Date   HGBA1C 5.2 02/27/2016   Coagulation profile Recent Labs  Lab 06/26/18 2047  INR 1.0   -----------------------------------------------------------------------------------------------------------------    Component Value Date/Time   BNP 38.4  02/09/2013 1430     Symphani Eckstrom M.D on 06/29/2018 at 9:53 AM  Go to [email or web address] - for contact info  [facility] - Office  [phone]

## 2018-06-30 ENCOUNTER — Other Ambulatory Visit: Payer: Self-pay

## 2018-06-30 ENCOUNTER — Encounter (HOSPITAL_COMMUNITY): Payer: Self-pay | Admitting: Orthopedic Surgery

## 2018-06-30 LAB — CBC
HCT: 22 % — ABNORMAL LOW (ref 36.0–46.0)
Hemoglobin: 7.2 g/dL — ABNORMAL LOW (ref 12.0–15.0)
MCH: 29.1 pg (ref 26.0–34.0)
MCHC: 32.7 g/dL (ref 30.0–36.0)
MCV: 89.1 fL (ref 80.0–100.0)
Platelets: 197 10*3/uL (ref 150–400)
RBC: 2.47 MIL/uL — ABNORMAL LOW (ref 3.87–5.11)
RDW: 13.7 % (ref 11.5–15.5)
WBC: 15.2 10*3/uL — ABNORMAL HIGH (ref 4.0–10.5)
nRBC: 0 % (ref 0.0–0.2)

## 2018-06-30 MED ORDER — DEXTROSE-NACL 5-0.9 % IV SOLN
INTRAVENOUS | Status: DC
  Administered 2018-06-30 – 2018-07-01 (×2): via INTRAVENOUS

## 2018-06-30 NOTE — Progress Notes (Signed)
Nutrition Follow-up  RD working remotely.  DOCUMENTATION CODES:   Underweight  INTERVENTION:   -Continue B-complex with vitamin C daily -Continue 500 mg vitamin C daily -Continue Ensure Enlive po TID, each supplement provides 350 kcal and 20 grams of protein -Magic Cup BID with meals, each supplement provides 290 kcals and 9 grams protein -Feeding assistance with meals -Pt with no BM since 06/25/18; consider initiating bowel regimen  NUTRITION DIAGNOSIS:   Increased nutrient needs related to post-op healing as evidenced by estimated needs.  Ongoing  GOAL:   Patient will meet greater than or equal to 90% of their needs  Progressing  MONITOR:   Diet advancement, PO intake, Supplement acceptance, Labs, Weight trends, Skin, I & O's  REASON FOR ASSESSMENT:   Other (Comment)    ASSESSMENT:   [name] is a 73 y.o. female with medical history significant of COPD, depression, gastroesophageal reflux disease, hypertension, previous thrombotic stroke, and vascular dementia who presents to the emergency department today after a fall in the bathroom this morning.  Patient fell and hit the back of her head where she had a laceration and a hematoma.  She also is complaining of right hip pain.  After the mechanical fall she complained of an associated headache, she has a scalp laceration and pain in the right hip and thigh.  She denies any vision changes, nausea, neck pain, back pain, or other injuries.  Per EMS she is at her baseline mentally.  4/3- s/p PROCEDURE: Right total hip arthroplasty, anterior approach 4/4- s/p BSE- advanced to dysphagia 1 diet with thin liquids  Reviewed I/O's: -200 ml x 24 hours and +2.3 L since admission  Pt unable to provide any further history secondary to advanced dementia.   Pt with poor oral intake; noted meal completion 10-40%. She is accepting Ensure supplements per MAR. No new wt recordings since admission.  Noted no BM since  06/25/18.  Medications reviewed and include remeron and senna.  Per CSW notes, plan to d/c back to facility with hospice services.   Labs reviewed: Na: 132.   Diet Order:   Diet Order            DIET - DYS 1 Room service appropriate? Yes; Fluid consistency: Thin  Diet effective now              EDUCATION NEEDS:   No education needs have been identified at this time  Skin:  Skin Assessment: Skin Integrity Issues: Skin Integrity Issues:: Incisions, Other (Comment) Incisions: closed rt hip Other: posterior head laceration  Last BM:  06/25/18  Height:   Ht Readings from Last 1 Encounters:  06/26/18 5\' 2"  (1.575 m)    Weight:   Wt Readings from Last 1 Encounters:  06/26/18 40.8 kg    Ideal Body Weight:  50 kg  BMI:  Body mass index is 16.46 kg/m.  Estimated Nutritional Needs:   Kcal:  1250-1450  Protein:  50-65 grams  Fluid:  > 1.2 L    Montreal Steidle A. Jimmye Norman, RD, LDN, [place] Registered Dietitian II Certified Diabetes Care and Education Specialist Pager: [phone] After hours Pager: [phone]

## 2018-06-30 NOTE — Care Management Important Message (Signed)
Important Message  Patient Details  Name: [name] MRN: [id number] Date of Birth: [birth year]   Medicare Important Message Given:  Yes    Orbie Pyo 06/30/2018, 4:28 PM

## 2018-06-30 NOTE — Progress Notes (Signed)
Bladder scan repeated >357mL. Dr. Joesph Fillers notified. Will continue to encourage PO intake and will continue to monitor.

## 2018-06-30 NOTE — Consult Note (Signed)
   [facility] CM Inpatient Consult   06/30/2018  [name] 1945/08/20 [id number]   Patient's chart was reviewed for extreme high risk score for unplanned readmissions. Chart review reveals that the patient is from [facility] with [facility].  Review of the Inpatient Transition of Care team reveals daughter would like the patient to return to skilled facility with Hospice Care.  Patient was hospitalized with the diagnosis of right hip fracture at a facility. Patient will have full case management services through Hospice for community case management follow up.  No [facility] community needs assessed. Sign off.  Natividad Brood, RN BSN [facility] Liaison  [phone] business mobile phone Toll free office [phone]     Please place a [facility] Care Management consult or for questions contact:

## 2018-06-30 NOTE — Progress Notes (Signed)
  Speech Language Pathology Treatment: Dysphagia  Patient Details Name: [name] MRN: [id number] DOB: [birth year] Today's Date: 06/30/2018 Time: 8832-5498 SLP Time Calculation (min) (ACUTE ONLY): 15 min  Assessment / Plan / Recommendation Clinical Impression  Pt seen at bedside for follow up after BSE completed 06/28/2018. RN was present, and provided meds crushed in puree. Pt appeared to tolerate this without obvious oral difficulty or overt s/s aspiration. RN reports pt has had poor po intake, but appears to tolerate current diet and drinks a lot of Coke. No overt s/s aspiration observed following trials of magic cup or water. RN reports lungs quiet, diminished. Pt is afebrile. She is at increased aspiration risk due to COPD, GERD, and dementia diagnoses. Will continue puree diet and thin liquids at this time. Safe swallow precautions posted at [facility].   HPI HPI: 73 year old female admitted 06/26/2018 after a fall at SNF. Pt sustained right femoral neck fracture. Pt underwent surgery 06/27/2018. PMH: carotid artery aneurysm, COPD, depression, DJD, GERD, HTN, CVA, vascular dementia.      SLP Plan  Continue with current plan of care       Recommendations  Diet recommendations: Dysphagia 1 (puree);Thin liquid Liquids provided via: Straw Medication Administration: Crushed with puree Supervision: Full supervision/cueing for compensatory strategies Compensations: Slow rate;Small sips/bites;Minimize environmental distractions Postural Changes and/or Swallow Maneuvers: Seated upright 90 degrees;Upright 30-60 min after meal                Oral Care Recommendations: Oral care BID Follow up Recommendations: 24 hour supervision/assistance SLP Visit Diagnosis: Dysphagia, unspecified (R13.10) Plan: Continue with current plan of care       GO              Gaylene Moylan B. Quentin Ore [facility], CCC-SLP Speech Language Pathologist [phone]  Shonna Chock 06/30/2018, 12:18 PM

## 2018-06-30 NOTE — Progress Notes (Signed)
Physical Therapy Treatment Patient Details Name: [name] MRN: [id number] DOB: 1945/12/12 Today's Date: 06/30/2018    History of Present Illness [name] is a 73 y.o. female with medical history significant of vascular dementia, prior stroke, HTN, COPD, falls.  Patient resides in ALF;  admitted  with fall, right rib fxs, right femur fx s/p anterior hip arthroplasty.     PT Comments    Pt refused PT, stating "no, no, no" to every question asked, however as PT moved LE towards EoB pt able to complete task of coming to EoB with minA. Pt continued to say "no" with getting to chair but was able to assist in power up and stepping to recliner. Once in recliner pt asked when she could get back to bed. PT recommended sitting up for at least 1 hour. SPL to see pt after PT left.     Follow Up Recommendations  SNF     Equipment Recommendations  None recommended by PT    Recommendations for Other Services       Precautions / Restrictions Precautions Precautions: Fall Restrictions Weight Bearing Restrictions: Yes RLE Weight Bearing: Weight bearing as tolerated    Mobility  Bed Mobility Overal bed mobility: Needs Assistance Bed Mobility: Supine to Sit     Supine to sit: Min assist;+2 for physical assistance     General bed mobility comments: minA for moving LE to EoB and for bringing trunk to upright, assist required for pad scoot to EoB  Transfers Overall transfer level: Needs assistance Equipment used: 2 person hand held assist Transfers: Sit to/from Stand Sit to Stand: Mod assist;+2 physical assistance;From elevated surface         General transfer comment: modA for power up to standing and steadying   Ambulation/Gait Ambulation/Gait assistance: Min assist;+2 physical assistance Gait Distance (Feet): 2 Feet   Gait Pattern/deviations: Step-to pattern;Decreased step length - right;Decreased step length - left;Shuffle;Antalgic Gait velocity: slowed Gait velocity  interpretation: <1.31 ft/sec, indicative of household ambulator General Gait Details: minA for stepping transfer to recliner       Balance Overall balance assessment: Needs assistance Sitting-balance support: Feet supported;Bilateral upper extremity supported Sitting balance-Leahy Scale: Poor     Standing balance support: Bilateral upper extremity supported Standing balance-Leahy Scale: Poor                              Cognition Arousal/Alertness: Awake/alert Behavior During Therapy: Agitated(snaps "no" to most requests) Overall Cognitive Status: No family/caregiver present to determine baseline cognitive functioning                                               Pertinent Vitals/Pain Pain Assessment: Faces Faces Pain Scale: No hurt    Home Living Family/patient expects to be discharged to:: Assisted living               Additional Comments: h/o dementia, unclear home DME    Prior Function        Comments: h/o dementia, unclear   PT Goals (current goals can now be found in the care plan section) Acute Rehab PT Goals Patient Stated Goal: pt unable to state PT Goal Formulation: Patient unable to participate in goal setting Time For Goal Achievement: 07/12/18 Potential to Achieve Goals: Fair Progress towards PT goals: Progressing toward  goals    Frequency    Min 3X/week      PT Plan Current plan remains appropriate       AM-PAC PT "6 Clicks" Mobility   Outcome Measure  Help needed turning from your back to your side while in a flat bed without using bedrails?: A Lot Help needed moving from lying on your back to sitting on the side of a flat bed without using bedrails?: Total Help needed moving to and from a bed to a chair (including a wheelchair)?: Total Help needed standing up from a chair using your arms (e.g., wheelchair or bedside chair)?: Total Help needed to walk in hospital room?: Total Help needed climbing 3-5  steps with a railing? : Total 6 Click Score: 7    End of Session Equipment Utilized During Treatment: Oxygen Activity Tolerance: Treatment limited secondary to agitation Patient left: in bed;with call bell/phone within reach;with bed alarm set Nurse Communication: Mobility status(refusal to sit EOB) PT Visit Diagnosis: Repeated falls (R29.6);Muscle weakness (generalized) (M62.81);Difficulty in walking, not elsewhere classified (R26.2)     Time: 6578-4696 PT Time Calculation (min) (ACUTE ONLY): 14 min  Charges:  $Therapeutic Activity: 8-22 mins                     Kadija Cruzen B. Migdalia Dk PT, DPT Acute Rehabilitation Services Pager [phone] Office [phone]    [place] 06/30/2018, 2:18 PM

## 2018-06-30 NOTE — NC FL2 (Signed)
[place] LEVEL OF CARE SCREENING TOOL     IDENTIFICATION  Patient Name: [name] Birthdate: 26-Apr-1945 Sex: female Admission Date (Current Location): 06/26/2018  [facility] and [place] Number:  Herbalist and Address:  [facility], [place] [address] [place], [place] [address]      Provider Number: [id number]  Attending Physician Name and Address:  Roxan Hockey, MD  Relative Name and Phone Number:  Lawrence Marse[place], daughter, [phone]    Current Level of Care: Hospital Recommended Level of Care: [place] Prior Approval Number:    Date Approved/Denied:   PASRR Number: pending  Discharge Plan: SNF    Current Diagnoses: Patient Active Problem List   Diagnosis Date Noted  . Acute blood loss as cause of postoperative anemia 06/28/2018  . Displaced fracture of right femoral neck ([place]) 06/27/2018  . Multiple fractures of ribs, right side, initial encounter for closed fracture 06/26/2018  . Fracture of femoral neck, right, closed ([place]) 06/26/2018  . Hematoma of right parietal scalp 06/26/2018  . Leukocytosis 06/26/2018  . Inadequate pain control 06/26/2018  . Hip fracture ([place]) 06/26/2018  . AKI (acute kidney injury) ([place]) 04/04/2018  . Hypoxia 04/04/2018  . Probable sepsis 04/04/2018  . Vascular dementia without behavioral disturbance ([place]) 06/30/2016  . Disease ruled out after examination   . Aphasia 05/15/2016  . Atherosclerosis of left carotid artery   . Cerebral embolism with cerebral infarction 02/27/2016  . Acute encephalopathy 02/26/2016  . Osteoporosis 07/04/2015  . Impaired cognition 03/30/2014  . Vitamin D deficiency 08/18/2013  . Hyperlipidemia 08/18/2013  . Prediabetes 08/18/2013  . Hospice care patient 08/18/2013  . CKD (chronic kidney disease) stage 3, GFR 30-59 ml/min ([name]) 07/01/2013  . Anemia of chronic disease 07/01/2013  . Depression, major, in remission ([place]) 06/30/2013  .  Essential hypertension 02/09/2013  . COPD GOLD III 03/28/2012  . Vocal cord dysfunction 03/28/2012  . Carotid artery aneurysm ([place]) 03/04/2012  . Smoker 03/04/2012    Orientation RESPIRATION BLADDER Height & Weight     Self, Place  O2(2L nasal canula) Incontinent, External catheter Weight: 90 lb (40.8 kg) Height:  5\' 2"  (662.9 cm)  BEHAVIORAL SYMPTOMS/MOOD NEUROLOGICAL BOWEL NUTRITION STATUS      Continent Diet(see discharge summary)  AMBULATORY STATUS COMMUNICATION OF NEEDS Skin   Extensive Assist Verbally Surgical wounds, Skin abrasions(surgical incision on right hip with hydrocolloid dressing; laceration on right side of head with gauze)                       Personal Care Assistance Level of Assistance  Bathing, Feeding, Dressing Bathing Assistance: Maximum assistance Feeding assistance: Independent Dressing Assistance: Maximum assistance     Functional Limitations Info  Sight, Hearing, Speech Sight Info: Adequate Hearing Info: Impaired Speech Info: Adequate    SPECIAL CARE FACTORS FREQUENCY  PT (By licensed PT), OT (By licensed OT)     PT Frequency: 5x week OT Frequency: 5x week            Contractures Contractures Info: Not present    Additional Factors Info  Code Status, Allergies, Psychotropic Code Status Info: Full Code Allergies Info: AMOXAPINE AND RELATED, AMOXICILLIN, CIPROCINONIDE FLUOCINOLONE, CIPROFLOXACIN, DOXYCYCLINE, ERYTHROMYCIN, SULFA ANTIBIOTICS, ZOFRAN ONDANSETRON HCL  Psychotropic Info: mirtazapine (REMERON) tablet 30 mg daily at bedtime PO; OLANZapine (ZYPREXA) tablet 7.5 mg 2x daily PO         Current Medications (06/30/2018):  This is the current hospital  active medication list Current Facility-Administered Medications  Medication Dose Route Frequency Provider Last Rate Last Dose  . acetaminophen (TYLENOL) tablet 650 mg  650 mg Oral Q6H Swinteck, Aaron Edelman, MD   650 mg at 06/30/18 1205  . alum & mag hydroxide-simeth (MAALOX/MYLANTA)  200-200-20 MG/5ML suspension 30 mL  30 mL Oral Q6H PRN Swinteck, Aaron Edelman, MD      . atorvastatin (LIPITOR) tablet 20 mg  20 mg Oral q1800 Rod Can, MD   20 mg at 06/29/18 1722  . B-complex with vitamin C tablet 1 tablet  1 tablet Oral Daily Rod Can, MD   1 tablet at 06/30/18 1030  . enoxaparin (LOVENOX) injection 30 mg  30 mg Subcutaneous Q24H Emokpae, Courage, MD   30 mg at 06/30/18 0743  . feeding supplement (ENSURE ENLIVE) (ENSURE ENLIVE) liquid 237 mL  237 mL Oral TID BM Rod Can, MD   237 mL at 06/30/18 1036  . gabapentin (NEURONTIN) capsule 300 mg  300 mg Oral QHS Rod Can, MD   300 mg at 06/29/18 2128  . guaiFENesin (ROBITUSSIN) 100 MG/5ML solution 200 mg  10 mL Oral Q6H PRN Swinteck, Aaron Edelman, MD      . hyoscyamine (LEVSIN SL) SL tablet 0.125 mg  0.125 mg Sublingual Q4H PRN Swinteck, Aaron Edelman, MD      . ipratropium-albuterol (DUONEB) 0.5-2.5 (3) MG/3ML nebulizer solution 3 mL  3 mL Nebulization Q6H PRN Swinteck, Aaron Edelman, MD      . loperamide (IMODIUM) capsule 2 mg  2 mg Oral PRN Swinteck, Aaron Edelman, MD      . LORazepam (ATIVAN) tablet 0.5 mg  0.5 mg Oral Q4H PRN Swinteck, Aaron Edelman, MD      . losartan (COZAAR) tablet 100 mg  100 mg Oral Daily Rod Can, MD   100 mg at 06/30/18 1029  . magnesium hydroxide (MILK OF MAGNESIA) suspension 30 mL  30 mL Oral QHS PRN Swinteck, Aaron Edelman, MD      . menthol-cetylpyridinium (CEPACOL) lozenge 3 mg  1 lozenge Oral PRN Swinteck, Aaron Edelman, MD       Or  . phenol (CHLORASEPTIC) mouth spray 1 spray  1 spray Mouth/Throat PRN Swinteck, Aaron Edelman, MD      . methocarbamol (ROBAXIN) tablet 500 mg  500 mg Oral BID Roxan Hockey, MD   500 mg at 06/30/18 0743  . metoCLOPramide (REGLAN) tablet 5-10 mg  5-10 mg Oral Q8H PRN Swinteck, Aaron Edelman, MD       Or  . metoCLOPramide (REGLAN) injection 5-10 mg  5-10 mg Intravenous Q8H PRN Rod Can, MD   10 mg at 06/29/18 2137  . mirtazapine (REMERON) tablet 30 mg  30 mg Oral QHS Rod Can, MD   30 mg at  06/29/18 2127  . morphine CONCENTRATE 10 MG/0.5ML oral solution 10 mg  10 mg Oral Q2H PRN Swinteck, Aaron Edelman, MD      . mupirocin ointment (BACTROBAN) 2 % 1 application  1 application Nasal BID Rod Can, MD   1 application at 96/78/93 1031  . OLANZapine (ZYPREXA) tablet 7.5 mg  7.5 mg Oral BID Rod Can, MD   7.5 mg at 06/30/18 1029  . polyethylene glycol (MIRALAX / GLYCOLAX) packet 17 g  17 g Oral Daily PRN Swinteck, Aaron Edelman, MD      . senna-docusate (Senokot-S) tablet 2 tablet  2 tablet Oral BID Roxan Hockey, MD   2 tablet at 06/30/18 1030  . tamsulosin (FLOMAX) capsule 0.4 mg  0.4 mg Oral QPC supper Roxan Hockey, MD   0.4 mg  at 06/29/18 1722  . traMADol (ULTRAM) tablet 50 mg  50 mg Oral BID PRN Rod Can, MD   50 mg at 06/29/18 1326  . traZODone (DESYREL) tablet 50 mg  50 mg Oral QHS PRN Swinteck, Aaron Edelman, MD      . vitamin C (ASCORBIC ACID) tablet 500 mg  500 mg Oral Daily Rod Can, MD   500 mg at 06/30/18 1030     Discharge Medications: Please see discharge summary for a list of discharge medications.  Relevant Imaging Results:  Relevant Lab Results:   Additional Information SSN: [id number]  [place], [place]

## 2018-06-30 NOTE — Progress Notes (Signed)
Patient Demographics:    [name], is a 73 y.o. female, DOB - 02-16-1946, OVF:[id number]  Admit date - 06/26/2018   Admitting Physician Lady Deutscher, MD  Outpatient Primary MD for the patient is [name], [name], PA-C  LOS - 4   Chief Complaint  Patient presents with   Fall        Subjective:    [name] today has no fevers, no emesis,  No chest pain, forgetful, urinary retention , oral intake is not great  Assessment  & Plan :    Principal Problem:   Fracture of femoral neck, right, closed ([place]) Active Problems:   Acute blood loss as cause of postoperative anemia   Essential hypertension   Hospice care patient   Multiple fractures of ribs, right side, initial encounter for closed fracture   Hematoma of right parietal scalp   Leukocytosis   Inadequate pain control   Hip fracture ([place])   Displaced fracture of right femoral neck ([name])  Brief Summary:- 73 y.o. female with medical history significant of COPD, depression, gastroesophageal reflux disease, hypertension, previous thrombotic stroke, and vascular dementia previously under hospice care at [facility] home who was admitted on 06/26/2018 after a fall with closed head injury/hematoma, right-sided rib fractures #3, 4, 5 and 6 and right hip fracture, ??  Dysphagia/swallowing issues    Plan:- 1)Advanced Dementia- Hospice care patient: She is followed by [facility]  hospice out of [place], resides at [place] home, continue Zyprexa 7.5 mg twice daily, Remeron 30 mg nightly, may use lorazepam as needed agitation/restlessness  2)Rt Hip Fx/right femoral neck fracture--- status post Right total hip arthroplasty on 06/27/18 by Dr. [name] , continue physical therapy  4)Social/Ethics--DNR/DNI, daughter/POA  is [name] [phone]  3)Multiple right-sided rib fractures--- ribs 3, 4 5 and 6--- pain control appears  adequate  4)Dysphagia/FTT-- ??  Aspiration risk, swallowing difficulty concerns, after discussion with patient's daughter she would like to try mechanically soft diet/dysphagia 3,  rather than pured diet/dysphagia 1, as per daughter patient is unlikely to do well on pured diet, prior to admission she already had trouble with failure to thrive and poor oral intake, patient daughter understands aspiration risk but she want to try pleasure feeding with mechanically soft diet/dysphagia 3 diet ....  speech/swallow evaluation appreciated,  5)Acute blood loss anemia in the postop patient----hemoglobin is down to 7.3 from 9.5 on 06/27/2018... Patient hemoglobin usually between 10 and 11, on admission hemoglobin was 15.2 probably due to hemoconcentration/dehydration... Monitor H&H closely and transfuse as clinically indicated   6) acute urinary retention in the postop patient --- as per patient's daughter in the past patient has had recurrent episodes of urinary retention, c/n  Flomax 0.4 daily  7)Persistent Leukocytosis---- blood cx NTD, Resp Panel is Neg  NB!! Oral intake remains poor, please see changes to patient's diet as outlined in #4 above, poor urine output most likely due to poor oral intake, give gentle IV fluids, reevaluate in a.m.  Disposition/Need for in-Hospital Stay- patient unable to be discharged at this time due to right hip fracture requiring operative correction--physical therapist recommends SNF rehab, awaiting SNF bed availability prior to discharge  Code Status : DNR/DNI  Family Communication:   Daughter ,  daughter is [name]  [phone]   PROCEDURE: Right total hip arthroplasty, anterior approach on 06/27/18 By Dr [name]  Disposition Plan  :  SNF  Consults  :  ortho  DVT Prophylaxis  :  Lovenox -    Lab Results  Component Value Date   PLT 197 06/30/2018    Inpatient Medications  Scheduled Meds:  acetaminophen  650 mg Oral Q6H   atorvastatin  20 mg Oral  q1800   B-complex with vitamin C  1 tablet Oral Daily   enoxaparin (LOVENOX) injection  30 mg Subcutaneous Q24H   feeding supplement (ENSURE ENLIVE)  237 mL Oral TID BM   gabapentin  300 mg Oral QHS   losartan  100 mg Oral Daily   methocarbamol  500 mg Oral BID   mirtazapine  30 mg Oral QHS   mupirocin ointment  1 application Nasal BID   OLANZapine  7.5 mg Oral BID   senna-docusate  2 tablet Oral BID   tamsulosin  0.4 mg Oral QPC supper   vitamin C  500 mg Oral Daily   Continuous Infusions:  PRN Meds:.alum & mag hydroxide-simeth, guaiFENesin, hyoscyamine, ipratropium-albuterol, loperamide, LORazepam, magnesium hydroxide, menthol-cetylpyridinium **OR** phenol, metoCLOPramide **OR** metoCLOPramide (REGLAN) injection, morphine CONCENTRATE, polyethylene glycol, traMADol, traZODone    Anti-infectives (From admission, onward)   Start     Dose/Rate Route Frequency Ordered Stop   06/27/18 1345  vancomycin (VANCOCIN) 1,000 mg in sodium chloride 0.9 % 250 mL IVPB     1,000 mg 250 mL/hr over 60 Minutes Intravenous Every 12 hours 06/27/18 1342 06/27/18 1620   06/27/18 0900  ceFAZolin (ANCEF) IVPB 2g/100 mL premix     2 g 200 mL/hr over 30 Minutes Intravenous To [facility] 06/26/18 2139 06/27/18 1008   06/27/18 0900  vancomycin (VANCOCIN) IVPB 1000 mg/200 mL premix  Status:  Discontinued     1,000 mg 200 mL/hr over 60 Minutes Intravenous On call to O.R. 06/27/18 0846 06/27/18 0851   06/27/18 0845  vancomycin (VANCOCIN) IVPB 1000 mg/200 mL premix     1,000 mg 200 mL/hr over 60 Minutes Intravenous  Once 06/27/18 0839 06/27/18 0958        Objective:   Vitals:   06/29/18 1737 06/29/18 2136 06/30/18 0446 06/30/18 1254  BP: 137/78 (!) 149/78 134/79 114/74  Pulse: (!) 122 (!) 113 (!) 104 92  Resp: 17 18 18 18   Temp: 97.9 F (36.6 C) 98.8 F (37.1 C) 98.1 F (36.7 C) 98.1 F (36.7 C)  TempSrc: Axillary Oral Axillary Oral  SpO2: 98% 98% 96% 98%  Weight:      Height:         Wt Readings from Last 3 Encounters:  06/26/18 40.8 kg  04/04/18 50.4 kg  06/29/16 49.9 kg    No intake or output data in the 24 hours ending 06/30/18 1629  Physical Exam Patient is examined daily including today on 06/30/18 , exams remain the same as of yesterday except that has changed   Gen:- Awake Alert,  In no apparent distress  HEENT:- [place].AT, No sclera icterus Nose- [place] 2 L/min Neck-Supple Neck,No JVD,.  Lungs-  CTAB , fair symmetrical air movement CV- S1, S2 normal, regular  Abd-  +ve B.Sounds, Abd Soft, No tenderness,    Extremity/Skin:- No  edema, pedal pulses present , rt Hip postop wound intact Psych-underlying dementia with cognitive deficits, patient is forgetful Neuro-generalized weakness, no new focal deficits, no tremors   Data Review:   Micro Results Recent Results (from the past  240 hour(s))  Surgical pcr screen     Status: Abnormal   Collection Time: 06/26/18 10:22 PM  Result Value Ref Range Status   MRSA, PCR POSITIVE (A) NEGATIVE Final    Comment: RESULT CALLED TO, READ BACK BY AND VERIFIED WITH: N CAGUIOA RN 06/27/18 0015 JDW    Staphylococcus aureus POSITIVE (A) NEGATIVE Final    Comment: (NOTE) The Xpert SA Assay (FDA approved for NASAL specimens in patients 63 years of age and older), is one component of a comprehensive surveillance program. It is not intended to diagnose infection nor to guide or monitor treatment. Performed at [facility], [employer] [address], [place], [place] [address]     Radiology Reports Dg Chest 1 View  Result Date: 06/26/2018 CLINICAL DATA:  Fall. EXAM: CHEST  1 VIEW COMPARISON:  04/03/2018 FINDINGS: The heart size and mediastinal contours are within normal limits. There is no evidence of pulmonary edema, consolidation, pneumothorax, nodule or pleural fluid. There appear to be new fractures of the right third, fourth, fifth and sixth ribs demonstrating minimal angulation/displacement. IMPRESSION: New fractures of the  right third, fourth, fifth and sixth ribs demonstrating minimal angulation and displacement. No evidence of associated pneumothorax or pleural fluid. Electronically Signed   By: Aletta Edouard M.D.   On: 06/26/2018 16:57   Dg Pelvis 1-2 Views  Result Date: 06/26/2018 CLINICAL DATA:  PER EMS, Pt lives ay [place] and fell in the bathroom and hit the back of her head. Pt also has lac to back of head . Pt reports pain to RT hip . EXAM: PELVIS - 1-2 VIEW COMPARISON:  CT, 09/19/2009. FINDINGS: Fracture of the right femoral neck, subcapital to mid femoral neck, nondisplaced and non comminuted, but with valgus angulation. No other fractures. No bone lesions. Hip joints, SI joints and symphysis pubis are normally spaced and aligned. Skeletal structures are demineralized. Soft tissues are unremarkable. IMPRESSION: 1. Nondisplaced, non comminuted, valgus angulated fracture of the right femoral neck. 2. No other fractures.  No dislocation. Electronically Signed   By: Lajean Manes M.D.   On: 06/26/2018 14:55   Ct Head Wo Contrast  Result Date: 06/26/2018 CLINICAL DATA:  Head trauma, headache. Neck pain. EXAM: CT HEAD WITHOUT CONTRAST CT CERVICAL SPINE WITHOUT CONTRAST TECHNIQUE: Multidetector CT imaging of the head and cervical spine was performed following the standard protocol without intravenous contrast. Multiplanar CT image reconstructions of the cervical spine were also generated. COMPARISON:  04/02/2018 FINDINGS: CT HEAD FINDINGS Brain: No evidence of acute infarction, hemorrhage, hydrocephalus, extra-axial collection or mass lesion/mass effect. Generalized atrophy. Hypoattenuation of white matter, likely chronic microvascular ischemic change. Chronic LEFT parietal infarct, significant volume loss. Vascular: Calcification of the cavernous internal carotid arteries consistent with cerebrovascular atherosclerotic disease. No signs of intracranial large vessel occlusion. Skull: Calvarium intact.  Sinuses/Orbits: No layering sinus fluid. Negative orbits. Other: Large RIGHT parietal scalp hematoma. CT CERVICAL SPINE FINDINGS Alignment: Normal. Skull base and vertebrae: No acute fracture. No primary bone lesion or focal pathologic process. Soft tissues and spinal canal: No prevertebral fluid or swelling. No visible canal hematoma. BILATERAL carotid bifurcation calcification. Disc levels:  No traumatic or calcified disc herniation. Upper chest: Biapical scarring and emphysematous change, greater on the RIGHT, without progression from January 2020 CT chest. Other: None. IMPRESSION: 1. No skull fracture or intracranial hemorrhage. Large RIGHT parietal scalp hematoma. 2. No cervical spine fracture or traumatic subluxation. Electronically Signed   By: Staci Righter M.D.   On: 06/26/2018 13:50  Ct Cervical Spine Wo Contrast  Result Date: 06/26/2018 CLINICAL DATA:  Head trauma, headache. Neck pain. EXAM: CT HEAD WITHOUT CONTRAST CT CERVICAL SPINE WITHOUT CONTRAST TECHNIQUE: Multidetector CT imaging of the head and cervical spine was performed following the standard protocol without intravenous contrast. Multiplanar CT image reconstructions of the cervical spine were also generated. COMPARISON:  04/02/2018 FINDINGS: CT HEAD FINDINGS Brain: No evidence of acute infarction, hemorrhage, hydrocephalus, extra-axial collection or mass lesion/mass effect. Generalized atrophy. Hypoattenuation of white matter, likely chronic microvascular ischemic change. Chronic LEFT parietal infarct, significant volume loss. Vascular: Calcification of the cavernous internal carotid arteries consistent with cerebrovascular atherosclerotic disease. No signs of intracranial large vessel occlusion. Skull: Calvarium intact. Sinuses/Orbits: No layering sinus fluid. Negative orbits. Other: Large RIGHT parietal scalp hematoma. CT CERVICAL SPINE FINDINGS Alignment: Normal. Skull base and vertebrae: No acute fracture. No primary bone lesion or  focal pathologic process. Soft tissues and spinal canal: No prevertebral fluid or swelling. No visible canal hematoma. BILATERAL carotid bifurcation calcification. Disc levels:  No traumatic or calcified disc herniation. Upper chest: Biapical scarring and emphysematous change, greater on the RIGHT, without progression from January 2020 CT chest. Other: None. IMPRESSION: 1. No skull fracture or intracranial hemorrhage. Large RIGHT parietal scalp hematoma. 2. No cervical spine fracture or traumatic subluxation. Electronically Signed   By: Staci Righter M.D.   On: 06/26/2018 13:50   Pelvis Portable  Result Date: 06/27/2018 CLINICAL DATA:  73 year old female with a history of right femoral neck fracture EXAM: PORTABLE PELVIS 1-2 VIEWS COMPARISON:  Preoperative radiographs 06/26/2018 FINDINGS: A single frontal view of the pelvis demonstrates interval surgical changes of right total hip arthroplasty. A cerclage wire encircles the intertrochanteric region of the femur. Aside from the 2 screws transfixing the acetabular cup, there is a third metallic fragment measuring 2.1 cm in length which resembles a drill bit. Expected subcutaneous emphysema in the soft tissues in the region of incision. IMPRESSION: Surgical changes of right total hip arthroplasty as above. Please see operative note for further detail. Electronically Signed   By: Jacqulynn Cadet M.D.   On: 06/27/2018 13:41   Dg Knee Right Port  Result Date: 06/26/2018 CLINICAL DATA:  Right knee pain after fall. EXAM: PORTABLE RIGHT KNEE - 1-2 VIEW COMPARISON:  None. FINDINGS: No evidence of fracture, dislocation, or joint effusion. No evidence of arthropathy or other focal bone abnormality. Vascular calcifications are noted. IMPRESSION: Negative. Electronically Signed   By: Marijo Conception, M.D.   On: 06/26/2018 16:50   Dg C-arm 1-60 Min  Result Date: 06/27/2018 CLINICAL DATA:  73 year old female with right hip fracture EXAM: DG C-ARM 61-120 MIN; OPERATIVE  LEFT HIP WITH PELVIS COMPARISON:  06/26/2010 FINDINGS: Limited intraoperative fluoroscopic spot images during right hip arthroplasty. IMPRESSION: Intraoperative fluoroscopic spot images for right hip arthroplasty. Please refer to the dictated operative report for full details of intraoperative findings and procedure. Electronically Signed   By: Corrie Mckusick D.O.   On: 06/27/2018 12:15   Dg Hip Operative Unilat W Or W/o Pelvis Left  Result Date: 06/27/2018 CLINICAL DATA:  73 year old female with right hip fracture EXAM: DG C-ARM 61-120 MIN; OPERATIVE LEFT HIP WITH PELVIS COMPARISON:  06/26/2010 FINDINGS: Limited intraoperative fluoroscopic spot images during right hip arthroplasty. IMPRESSION: Intraoperative fluoroscopic spot images for right hip arthroplasty. Please refer to the dictated operative report for full details of intraoperative findings and procedure. Electronically Signed   By: Corrie Mckusick D.O.   On: 06/27/2018 12:15   Dg Femur, Min  2 Views Right  Result Date: 06/26/2018 CLINICAL DATA:  PER EMS, Pt lives ay [place] and fell in the bathroom and hit the back of her head. Pt also has lac to back of head . Pt reports pain to RT hip . EXAM: RIGHT FEMUR 2 VIEWS COMPARISON:  None. FINDINGS: Non comminuted fracture of the right femoral neck. No significant fracture displacement. There is valgus fracture angulation. No other fractures.  Hip and knee joints are normally aligned. Soft tissues are unremarkable. IMPRESSION: 1. Nondisplaced, non comminuted, valgus angulated fracture of the right femoral neck. 2. No other fractures.  No dislocation. Electronically Signed   By: Lajean Manes M.D.   On: 06/26/2018 14:54     CBC Recent Labs  Lab 06/26/18 1506 06/27/18 1134 06/28/18 0448 06/29/18 0222 06/30/18 0341  WBC 23.3*  --  19.8* 15.1* 15.2*  HGB 15.2* 9.5* 7.6* 7.3* 7.2*  HCT 46.1* 28.0* 23.3* 22.2* 22.0*  PLT 398  --  163 181 197  MCV 90.7  --  90.0 88.4 89.1  MCH 29.9  --  29.3  29.1 29.1  MCHC 33.0  --  32.6 32.9 32.7  RDW 13.2  --  13.3 13.7 13.7  LYMPHSABS 2.4  --   --   --   --   MONOABS 1.7*  --   --   --   --   EOSABS 0.1  --   --   --   --   BASOSABS 0.1  --   --   --   --     Chemistries  Recent Labs  Lab 06/26/18 1506 06/27/18 1134 06/28/18 0206  NA 136 135 132*  K 3.9 3.7 3.7  CL 99  --  102  CO2 24  --  17*  GLUCOSE 135* 133* 166*  BUN 7*  --  10  CREATININE 0.96  --  1.00  CALCIUM 9.3  --  7.9*    Lab Results  Component Value Date   HGBA1C 5.2 02/27/2016   Coagulation profile Recent Labs  Lab 06/26/18 2047  INR 1.0   -----------------------------------------------------------------------------------------------------------------    Component Value Date/Time   BNP 38.4 02/09/2013 1430     Jarone Ostergaard M.D on 06/30/2018 at 4:29 PM  Go to [email or web address] - for contact info  [facility] - Office  [phone]

## 2018-06-30 NOTE — Progress Notes (Signed)
Spoke with Dr. Joesph Fillers about patient's limited PO intake and no urine output for the day. Will continue to monitor and encourage fluid intake.

## 2018-06-30 NOTE — TOC Progression Note (Addendum)
Transition of Care [facility]) - Progression Note    Patient Details  Name: [name] MRN: [id number] Date of Birth: 1945/05/10  Transition of Care [facility]) CM/SW [place], [place] Phone Number: 06/30/2018, 11:38 AM  Clinical Narrative:    4:12pm- Spoke with pt daughter, she is amenable to SNF before return to ALF, have faxed out referral for pt, will provide pt daughter with choice when available.   2:42pm- Spoke with [facility], they are unable to meet pt needs at current time and feel she is best placed in a SNF, I requested that they call and discuss this with pt daughter. I also had received a call from [facility] with [facility], returned call with no answer to [phone]. Will start FL2 and PASRR for pt.   2:09pm- Called and left an additionial message at [place] with pt daughter [name], pt daughter given updates regarding PT recommendations.  Pt daughter preference is for her to return to ALF with hospice care; if [facility] is unable to take pt back then she will consider SNF.   Call placed to [facility], staff in meeting at this time.    Expected Discharge Plan: [place] Barriers to Discharge: Continued Medical Work up, [place] Forensic scientist)  Expected Discharge Plan and Services Expected Discharge Plan: [place] In-house Referral: Hospice / [place] Choice: [name] arrangements for the past 2 months: [place]: [facility](Hospice)   Social Determinants of Health (SDOH) Interventions    Readmission Risk Interventions Readmission Risk Prevention Plan 06/27/2018  Transportation Screening Complete  PCP or Specialist Appt within 3-5 Days Complete  HRI or Home Care Consult Complete  Social Work Consult for [place] Planning/Counseling Complete   Palliative Care Screening Complete  Medication Review Press photographer) Complete  Some recent data might be hidden

## 2018-07-01 DIAGNOSIS — Z515 Encounter for palliative care: Secondary | ICD-10-CM

## 2018-07-01 LAB — CBC
HCT: 20.2 % — ABNORMAL LOW (ref 36.0–46.0)
Hemoglobin: 6.7 g/dL — CL (ref 12.0–15.0)
MCH: 29.1 pg (ref 26.0–34.0)
MCHC: 33.2 g/dL (ref 30.0–36.0)
MCV: 87.8 fL (ref 80.0–100.0)
Platelets: 268 10*3/uL (ref 150–400)
RBC: 2.3 MIL/uL — ABNORMAL LOW (ref 3.87–5.11)
RDW: 13.8 % (ref 11.5–15.5)
WBC: 15.1 10*3/uL — ABNORMAL HIGH (ref 4.0–10.5)
nRBC: 0 % (ref 0.0–0.2)

## 2018-07-01 LAB — BASIC METABOLIC PANEL
Anion gap: 6 (ref 5–15)
BUN: 14 mg/dL (ref 8–23)
CO2: 22 mmol/L (ref 22–32)
Calcium: 7.8 mg/dL — ABNORMAL LOW (ref 8.9–10.3)
Chloride: 105 mmol/L (ref 98–111)
Creatinine, Ser: 0.78 mg/dL (ref 0.44–1.00)
GFR calc Af Amer: >60 mL/min (ref >60)
GFR calc non Af Amer: >60 mL/min (ref >60)
Glucose, Bld: 134 mg/dL — ABNORMAL HIGH (ref 70–99)
Potassium: 3.5 mmol/L (ref 3.5–5.1)
Sodium: 133 mmol/L — ABNORMAL LOW (ref 135–145)

## 2018-07-01 LAB — PREPARE RBC (CROSSMATCH)

## 2018-07-01 MED ORDER — FUROSEMIDE 10 MG/ML IJ SOLN
20.0000 mg | Freq: Once | INTRAMUSCULAR | Status: AC
  Administered 2018-07-01: 20 mg via INTRAVENOUS
  Filled 2018-07-01: qty 2

## 2018-07-01 MED ORDER — PRO-STAT SUGAR FREE PO LIQD
30.0000 mL | Freq: Two times a day (BID) | ORAL | Status: DC
  Administered 2018-07-01 – 2018-07-02 (×3): 30 mL via ORAL
  Filled 2018-07-01 (×3): qty 30

## 2018-07-01 MED ORDER — POTASSIUM CHLORIDE CRYS ER 20 MEQ PO TBCR
40.0000 meq | EXTENDED_RELEASE_TABLET | Freq: Once | ORAL | Status: AC
  Administered 2018-07-01: 40 meq via ORAL
  Filled 2018-07-01: qty 2

## 2018-07-01 MED ORDER — SODIUM CHLORIDE 0.9% IV SOLUTION
Freq: Once | INTRAVENOUS | Status: AC
  Administered 2018-07-01: 12:00:00 via INTRAVENOUS

## 2018-07-01 MED ORDER — ENSURE ENLIVE PO LIQD
237.0000 mL | Freq: Every morning | ORAL | Status: DC
  Administered 2018-07-01 – 2018-07-02 (×2): 237 mL via ORAL

## 2018-07-01 NOTE — TOC Progression Note (Addendum)
Transition of Care [facility]) - Progression Note    Patient Details  Name: [name] MRN: [id number] Date of Birth: 06/14/1945  Transition of Care [facility]) CM/SW [place], [place] Phone Number: 07/01/2018, 1:15 PM  Clinical Narrative:    4:37pm- Updated pt daughter, [facility] unable to offer, will initiate insurance authorization with [facility].   2:45pm- Received email back from pt daughter; she is amenable to [facility] and aware [facility] is reviewing offer.   1:15pm- [facility] unable to offer to pt, and [facility] also unable to offer to pt at this time. Provided offer via email to pt daughter at [email or web address] along with information regarding denials. Pt referral being reviewed at this time by [facility] facilities also.    Expected Discharge Plan: [place] Barriers to Discharge: Continued Medical Work up, [place] Forensic scientist)  Expected Discharge Plan and Services Expected Discharge Plan: [place] In-house Referral: Hospice / [place] Choice: [place] arrangements for the past 2 months: [place]: [facility](Hospice)   Social Determinants of Health (SDOH) Interventions    Readmission Risk Interventions Readmission Risk Prevention Plan 06/27/2018  Transportation Screening Complete  PCP or Specialist Appt within 3-5 Days Complete  HRI or Home Care Consult Complete  Social Work Consult for [place] Planning/Counseling Complete  Palliative Care Screening Complete  Medication Review Press photographer) Complete  Some recent data might be hidden

## 2018-07-01 NOTE — Progress Notes (Signed)
CRITICAL VALUE ALERT  Critical Value:  Hgb 6.7  Date & Time Notied:  07/01/2018, 0915  Provider Notified: Dr. Joesph Fillers  Orders Received/Actions taken: Awaiting orders

## 2018-07-01 NOTE — Progress Notes (Addendum)
Nutrition Follow-up  RD working remotely.  DOCUMENTATION CODES:   Underweight  INTERVENTION:   -Continue B-complex with vitamin C daily -Continue 500 mg vitamin C daily -Decrease Ensure Enlive poto daily, each supplement provides 350 kcal and 20 grams of protein -30 ml Prostat BID, each supplement provides 100 kcals and 15 grams protein -Continue Magic Cup BID with meals, each supplement provides 290 kcals and 9 grams protein -Continue feeding assistance with meals -Pt with no BM since 06/25/18; consider initiating bowel regimen  NUTRITION DIAGNOSIS:   Increased nutrient needs related to post-op healing as evidenced by estimated needs.  Ongoing  GOAL:   Patient will meet greater than or equal to 90% of their needs  Progressing  MONITOR:   Diet advancement, PO intake, Supplement acceptance, Labs, Weight trends, Skin, I & O's  REASON FOR ASSESSMENT:   Other (Comment)    ASSESSMENT:   [name] is a 73 y.o. female with medical history significant of COPD, depression, gastroesophageal reflux disease, hypertension, previous thrombotic stroke, and vascular dementia who presents to the emergency department today after a fall in the bathroom this morning.  Patient fell and hit the back of her head where she had a laceration and a hematoma.  She also is complaining of right hip pain.  After the mechanical fall she complained of an associated headache, she has a scalp laceration and pain in the right hip and thigh.  She denies any vision changes, nausea, neck pain, back pain, or other injuries.  Per EMS she is at her baseline mentally.  4/3- s/p PROCEDURE:Righttotal hip arthroplasty, anterior approach 4/4- s/p BSE- advanced to dysphagia 1 diet with thin liquids 4/6- s/p repeat BSE- recommend continue D1/t; advanced to D3/t per MD per pt daughter request for pleasure feedings  Reviewed I/O's: +566 ml x 24 hours and +2.8 L since admission  Per nursing notes, pt with no UOP  yesterday. Bladder scan revealed >359 ml.   Pt continues with poor oral intake. Meal completion 10-40%. Pt consuming a lot of cola. Per MAR, she is consuming only one Ensure supplement per day. Per MD notes, pt daughter requested pt be on a mechanical soft diet for pleasure, as pt does not prefer the dysphagia 1 diet. Given hospice status, suspect continued poor oral intake and nutritional decline.   Medications reviewed and include remeron and senna. Pt still has not had a BM since 06/25/18 (suspect poor oral intake contributing).   Labs reviewed: Na: 133.   Diet Order:   Diet Order            DIET DYS 3 Room service appropriate? Yes; Fluid consistency: Thin  Diet effective now              EDUCATION NEEDS:   No education needs have been identified at this time  Skin:  Skin Assessment: Skin Integrity Issues: Skin Integrity Issues:: Incisions, Other (Comment) Incisions: closed rt hip Other: posterior head laceration  Last BM:  06/25/18  Height:   Ht Readings from Last 1 Encounters:  06/26/18 5\' 2"  (1.575 m)    Weight:   Wt Readings from Last 1 Encounters:  06/26/18 40.8 kg    Ideal Body Weight:  50 kg  BMI:  Body mass index is 16.46 kg/m.  Estimated Nutritional Needs:   Kcal:  1250-1450  Protein:  50-65 grams  Fluid:  > 1.2 L    Thelma Lorenzetti A. Jimmye Norman, RD, LDN, [place] Registered Dietitian II Certified Diabetes Care and Education Specialist  Pager: [phone] After hours Pager: [phone]

## 2018-07-01 NOTE — Progress Notes (Signed)
Patient's PIV blew x2, in right AC and left forearm. 1 unit of blood was picked up at 1137. Several attempts made to obtain IV. IV team at the beside currently. Confirmed with blood bank and was given permission to administer the current unit as long as the unit was administered within the 4 hours from pick up time.

## 2018-07-01 NOTE — Progress Notes (Signed)
Physical Therapy Treatment Patient Details Name: [name] MRN: [id number] DOB: [birth year] Today's Date: 07/01/2018    History of Present Illness [name] is a 73 y.o. female with medical history significant of vascular dementia, prior stroke, HTN, COPD, falls.  Patient resides in ALF;  admitted  with fall, right rib fxs, right femur fx s/p anterior hip arthroplasty.     PT Comments    Patient participates with in bed therex without complaint, in fact no idea which hip is injured when asked, but extremely fearful and not agreeable to OOB.  Would have insisted, except RN reports for PRBC's due to hemoglobin 6.7.  Will continue skilled PT and continue to recommend SNF level rehab at d/c.    Follow Up Recommendations  SNF     Equipment Recommendations  None recommended by PT    Recommendations for Other Services       Precautions / Restrictions Precautions Precautions: Fall Restrictions RLE Weight Bearing: Weight bearing as tolerated    Mobility  Bed Mobility               General bed mobility comments: initiated, but pt kept saying "no", called RN who reports pt with critical hemoglobin and for PRBC today, so let pt refuse  Transfers                    Ambulation/Gait                 Stairs             Wheelchair Mobility    Modified Rankin (Stroke Patients Only)       Balance                                            Cognition Arousal/Alertness: Awake/alert Behavior During Therapy: Agitated(states "no" to getting up) Overall Cognitive Status: No family/caregiver present to determine baseline cognitive functioning                                        Exercises General Exercises - Lower Extremity Ankle Circles/Pumps: AROM;AAROM;10 reps;Supine;Both Quad Sets: AROM;5 reps;Supine;Both Short Arc Quad: AROM;10 reps;5 reps;Right;Supine Heel Slides: AAROM;Both;5 reps;Supine    General  Comments        Pertinent Vitals/Pain Faces Pain Scale: No hurt    Home Living                      Prior Function            PT Goals (current goals can now be found in the care plan section) Progress towards PT goals: Not progressing toward goals - comment(mobility limited due to anemia)    Frequency    Min 2X/week      PT Plan Current plan remains appropriate;Frequency needs to be updated    Co-evaluation              AM-PAC PT "6 Clicks" Mobility   Outcome Measure  Help needed turning from your back to your side while in a flat bed without using bedrails?: A Lot Help needed moving from lying on your back to sitting on the side of a flat bed without using bedrails?: Total Help needed moving to and from a bed to  a chair (including a wheelchair)?: Total Help needed standing up from a chair using your arms (e.g., wheelchair or bedside chair)?: Total Help needed to walk in hospital room?: Total Help needed climbing 3-5 steps with a railing? : Total 6 Click Score: 7    End of Session Equipment Utilized During Treatment: Oxygen Activity Tolerance: Treatment limited secondary to medical complications (Comment)(low hemoglobin) Patient left: in bed;with call bell/phone within reach;with bed alarm set   PT Visit Diagnosis: Repeated falls (R29.6);Muscle weakness (generalized) (M62.81);Difficulty in walking, not elsewhere classified (R26.2)     Time: 1000-1013 PT Time Calculation (min) (ACUTE ONLY): 13 min  Charges:  $Therapeutic Exercise: 8-22 mins                     Magda Kiel, [place] [facility] [phone] 07/01/2018    Reginia Naas 07/01/2018, 11:43 AM

## 2018-07-01 NOTE — Progress Notes (Signed)
Patient vomited last does of medications. States she does not feel nauseated but it tasted nasty. Will continue to monitor.

## 2018-07-01 NOTE — Progress Notes (Signed)
Patient Demographics:    [name], is a 73 y.o. female, DOB - 04/23/1945, MAU:[id number]  Admit date - 06/26/2018   Admitting Physician Lady Deutscher, MD  Outpatient Primary MD for the patient is [name], [name], PA-C  LOS - 5   Chief Complaint  Patient presents with   Fall        Subjective:    [name] today has no fevers, no emesis,  No chest pain, forgetful, cooperative-, resting comfortably  Assessment  & Plan :    Principal Problem:   Fracture of femoral neck, right, closed ([place]) Active Problems:   Acute blood loss as cause of postoperative anemia   Essential hypertension   Hospice care patient   Multiple fractures of ribs, right side, initial encounter for closed fracture   Hematoma of right parietal scalp   Leukocytosis   Inadequate pain control   Hip fracture ([place])   Displaced fracture of right femoral neck ([name])  Brief Summary:- 73 y.o. female with medical history significant of COPD, depression, gastroesophageal reflux disease, hypertension, previous thrombotic stroke, and vascular dementia previously under hospice care at [facility] home who was admitted on 06/26/2018 after a fall with closed head injury/hematoma, right-sided rib fractures #3, 4, 5 and 6 and right hip fracture, ??  Dysphagia/swallowing issues, -Required 1 unit of packed cells on 07/01/2018 awaiting SNF placement.    Plan:- 1)Advanced Dementia- Hospice care patient: She is followed by [facility]  hospice out of [place], resides at [facility] home, continue Zyprexa 7.5 mg twice daily, c/n Remeron 30 mg nightly, may use lorazepam as needed agitation/restlessness  2)Rt Hip Fx/right femoral neck fracture--- status post Right total hip arthroplasty on 06/27/18 by Dr. [name] , continue physical therapy  4)Social/Ethics--DNR/DNI, daughter/POA  is [name] [phone]  3)Multiple right-sided rib  fractures--- ribs 3, 4 5 and 6--- pain control appears adequate  4)Dysphagia/FTT-- ??  Aspiration risk, swallowing difficulty concerns, after discussion with patient's daughter she would like to try mechanically soft diet/dysphagia 3,  rather than pured diet/dysphagia 1, as per daughter patient is unlikely to do well on pured diet, prior to admission she already had trouble with failure to thrive and poor oral intake, patient and daughter understands aspiration risk but she want to try pleasure feeding with mechanically soft diet/dysphagia 3 diet ....  speech/swallow evaluation appreciated,  5)Acute blood loss symptomatic anemia in the postop patient----hemoglobin is down to 6.7 from 9.5 on 06/27/2018... Patient hemoglobin usually between 10 and 11, on admission hemoglobin was 15.2 probably due to hemoconcentration/dehydration... Patient is tachycardic, so transfuse 1 unit of packed cells for symptomatic anemia.Marland KitchenMarland Kitchen  6) acute urinary retention in the postop patient --- as per patient's daughter in the past patient has had recurrent episodes of urinary retention, c/n  Flomax 0.4 daily  7)Persistent Leukocytosis---- blood cx NTD, Resp Panel is Neg  Disposition/Need for in-Hospital Stay- patient unable to be discharged at this time due to right hip fracture requiring operative correction--physical therapist recommends SNF rehab, awaiting SNF bed availability prior to discharge  Code Status : DNR/DNI  Family Communication:   Daughter ,  daughter is [name] [phone]   PROCEDURE: Right total hip arthroplasty, anterior approach on 06/27/18 By Dr [name]  -Required 1 unit of packed  cells on 07/01/2018   Disposition Plan  :  SNF  Consults  :  ortho  DVT Prophylaxis  :  Lovenox -    Lab Results  Component Value Date   PLT 268 07/01/2018    Inpatient Medications  Scheduled Meds:  acetaminophen  650 mg Oral Q6H   atorvastatin  20 mg Oral q1800   B-complex with vitamin C  1 tablet Oral  Daily   enoxaparin (LOVENOX) injection  30 mg Subcutaneous Q24H   feeding supplement (ENSURE ENLIVE)  237 mL Oral q morning - 10a   feeding supplement (PRO-STAT SUGAR FREE 64)  30 mL Oral BID WC   gabapentin  300 mg Oral QHS   losartan  100 mg Oral Daily   methocarbamol  500 mg Oral BID   mirtazapine  30 mg Oral QHS   mupirocin ointment  1 application Nasal BID   OLANZapine  7.5 mg Oral BID   senna-docusate  2 tablet Oral BID   tamsulosin  0.4 mg Oral QPC supper   vitamin C  500 mg Oral Daily   Continuous Infusions:  dextrose 5 % and 0.9% NaCl 20 mL/hr at 07/01/18 1505   PRN Meds:.alum & mag hydroxide-simeth, guaiFENesin, hyoscyamine, ipratropium-albuterol, loperamide, LORazepam, magnesium hydroxide, menthol-cetylpyridinium **OR** phenol, metoCLOPramide **OR** metoCLOPramide (REGLAN) injection, morphine CONCENTRATE, polyethylene glycol, traMADol, traZODone    Anti-infectives (From admission, onward)   Start     Dose/Rate Route Frequency Ordered Stop   06/27/18 1345  vancomycin (VANCOCIN) 1,000 mg in sodium chloride 0.9 % 250 mL IVPB     1,000 mg 250 mL/hr over 60 Minutes Intravenous Every 12 hours 06/27/18 1342 06/27/18 1620   06/27/18 0900  ceFAZolin (ANCEF) IVPB 2g/100 mL premix     2 g 200 mL/hr over 30 Minutes Intravenous To ShortStay Surgical 06/26/18 2139 06/27/18 1008   06/27/18 0900  vancomycin (VANCOCIN) IVPB 1000 mg/200 mL premix  Status:  Discontinued     1,000 mg 200 mL/hr over 60 Minutes Intravenous On call to O.R. 06/27/18 0846 06/27/18 0851   06/27/18 0845  vancomycin (VANCOCIN) IVPB 1000 mg/200 mL premix     1,000 mg 200 mL/hr over 60 Minutes Intravenous  Once 06/27/18 0839 06/27/18 0958        Objective:   Vitals:   07/01/18 1218 07/01/18 1236 07/01/18 1348 07/01/18 1451  BP: 111/63 126/78 (!) 150/88 (!) 148/99  Pulse: (!) 122 (!) 125 (!) 124 (!) 119  Resp: 16 16  20   Temp: 98.3 F (36.8 C)   99.1 F (37.3 C)  TempSrc: Oral Oral  Axillary    SpO2:  98%    Weight:      Height:        Wt Readings from Last 3 Encounters:  06/26/18 40.8 kg  04/04/18 50.4 kg  06/29/16 49.9 kg     Intake/Output Summary (Last 24 hours) at 07/01/2018 1627 Last data filed at 07/01/2018 1521 Gross per 24 hour  Intake 1343.42 ml  Output --  Net 1343.42 ml    Physical Exam Patient is examined daily including today on 07/01/18 , exams remain the same as of yesterday except that has changed   Gen:- Awake Alert,  In no apparent distress  HEENT:- [place].AT, No sclera icterus Nose- [place] 2 L/min Neck-Supple Neck,No JVD,.  Lungs-  CTAB , fair symmetrical air movement CV- S1, S2 normal, regular, tachycardic most likely due to anemia Abd-  +ve B.Sounds, Abd Soft, No tenderness,    Extremity/Skin:- No  edema, pedal pulses present , rt Hip postop wound intact Psych-underlying dementia with cognitive deficits, patient is forgetful Neuro-generalized weakness, no new focal deficits, no tremors   Data Review:   Micro Results Recent Results (from the past 240 hour(s))  Surgical pcr screen     Status: Abnormal   Collection Time: 06/26/18 10:22 PM  Result Value Ref Range Status   MRSA, PCR POSITIVE (A) NEGATIVE Final    Comment: RESULT CALLED TO, READ BACK BY AND VERIFIED WITH: N CAGUIOA RN 06/27/18 0015 JDW    Staphylococcus aureus POSITIVE (A) NEGATIVE Final    Comment: (NOTE) The Xpert SA Assay (FDA approved for NASAL specimens in patients 31 years of age and older), is one component of a comprehensive surveillance program. It is not intended to diagnose infection nor to guide or monitor treatment. Performed at [facility], [address] [place], [place] [address]     Radiology Reports Dg Chest 1 View  Result Date: 06/26/2018 CLINICAL DATA:  Fall. EXAM: CHEST  1 VIEW COMPARISON:  04/03/2018 FINDINGS: The heart size and mediastinal contours are within normal limits. There is no evidence of pulmonary edema, consolidation, pneumothorax, nodule  or pleural fluid. There appear to be new fractures of the right third, fourth, fifth and sixth ribs demonstrating minimal angulation/displacement. IMPRESSION: New fractures of the right third, fourth, fifth and sixth ribs demonstrating minimal angulation and displacement. No evidence of associated pneumothorax or pleural fluid. Electronically Signed   By: Aletta Edouard M.D.   On: 06/26/2018 16:57   Dg Pelvis 1-2 Views  Result Date: 06/26/2018 CLINICAL DATA:  PER EMS, Pt lives ay [place] and fell in the bathroom and hit the back of her head. Pt also has lac to back of head . Pt reports pain to RT hip . EXAM: PELVIS - 1-2 VIEW COMPARISON:  CT, 09/19/2009. FINDINGS: Fracture of the right femoral neck, subcapital to mid femoral neck, nondisplaced and non comminuted, but with valgus angulation. No other fractures. No bone lesions. Hip joints, SI joints and symphysis pubis are normally spaced and aligned. Skeletal structures are demineralized. Soft tissues are unremarkable. IMPRESSION: 1. Nondisplaced, non comminuted, valgus angulated fracture of the right femoral neck. 2. No other fractures.  No dislocation. Electronically Signed   By: Lajean Manes M.D.   On: 06/26/2018 14:55   Ct Head Wo Contrast  Result Date: 06/26/2018 CLINICAL DATA:  Head trauma, headache. Neck pain. EXAM: CT HEAD WITHOUT CONTRAST CT CERVICAL SPINE WITHOUT CONTRAST TECHNIQUE: Multidetector CT imaging of the head and cervical spine was performed following the standard protocol without intravenous contrast. Multiplanar CT image reconstructions of the cervical spine were also generated. COMPARISON:  04/02/2018 FINDINGS: CT HEAD FINDINGS Brain: No evidence of acute infarction, hemorrhage, hydrocephalus, extra-axial collection or mass lesion/mass effect. Generalized atrophy. Hypoattenuation of white matter, likely chronic microvascular ischemic change. Chronic LEFT parietal infarct, significant volume loss. Vascular: Calcification of the  cavernous internal carotid arteries consistent with cerebrovascular atherosclerotic disease. No signs of intracranial large vessel occlusion. Skull: Calvarium intact. Sinuses/Orbits: No layering sinus fluid. Negative orbits. Other: Large RIGHT parietal scalp hematoma. CT CERVICAL SPINE FINDINGS Alignment: Normal. Skull base and vertebrae: No acute fracture. No primary bone lesion or focal pathologic process. Soft tissues and spinal canal: No prevertebral fluid or swelling. No visible canal hematoma. BILATERAL carotid bifurcation calcification. Disc levels:  No traumatic or calcified disc herniation. Upper chest: Biapical scarring and emphysematous change, greater on the RIGHT, without progression from January 2020 CT  chest. Other: None. IMPRESSION: 1. No skull fracture or intracranial hemorrhage. Large RIGHT parietal scalp hematoma. 2. No cervical spine fracture or traumatic subluxation. Electronically Signed   By: Staci Righter M.D.   On: 06/26/2018 13:50   Ct Cervical Spine Wo Contrast  Result Date: 06/26/2018 CLINICAL DATA:  Head trauma, headache. Neck pain. EXAM: CT HEAD WITHOUT CONTRAST CT CERVICAL SPINE WITHOUT CONTRAST TECHNIQUE: Multidetector CT imaging of the head and cervical spine was performed following the standard protocol without intravenous contrast. Multiplanar CT image reconstructions of the cervical spine were also generated. COMPARISON:  04/02/2018 FINDINGS: CT HEAD FINDINGS Brain: No evidence of acute infarction, hemorrhage, hydrocephalus, extra-axial collection or mass lesion/mass effect. Generalized atrophy. Hypoattenuation of white matter, likely chronic microvascular ischemic change. Chronic LEFT parietal infarct, significant volume loss. Vascular: Calcification of the cavernous internal carotid arteries consistent with cerebrovascular atherosclerotic disease. No signs of intracranial large vessel occlusion. Skull: Calvarium intact. Sinuses/Orbits: No layering sinus fluid. Negative  orbits. Other: Large RIGHT parietal scalp hematoma. CT CERVICAL SPINE FINDINGS Alignment: Normal. Skull base and vertebrae: No acute fracture. No primary bone lesion or focal pathologic process. Soft tissues and spinal canal: No prevertebral fluid or swelling. No visible canal hematoma. BILATERAL carotid bifurcation calcification. Disc levels:  No traumatic or calcified disc herniation. Upper chest: Biapical scarring and emphysematous change, greater on the RIGHT, without progression from January 2020 CT chest. Other: None. IMPRESSION: 1. No skull fracture or intracranial hemorrhage. Large RIGHT parietal scalp hematoma. 2. No cervical spine fracture or traumatic subluxation. Electronically Signed   By: Staci Righter M.D.   On: 06/26/2018 13:50   Pelvis Portable  Result Date: 06/27/2018 CLINICAL DATA:  73 year old female with a history of right femoral neck fracture EXAM: PORTABLE PELVIS 1-2 VIEWS COMPARISON:  Preoperative radiographs 06/26/2018 FINDINGS: A single frontal view of the pelvis demonstrates interval surgical changes of right total hip arthroplasty. A cerclage wire encircles the intertrochanteric region of the femur. Aside from the 2 screws transfixing the acetabular cup, there is a third metallic fragment measuring 2.1 cm in length which resembles a drill bit. Expected subcutaneous emphysema in the soft tissues in the region of incision. IMPRESSION: Surgical changes of right total hip arthroplasty as above. Please see operative note for further detail. Electronically Signed   By: Jacqulynn Cadet M.D.   On: 06/27/2018 13:41   Dg Knee Right Port  Result Date: 06/26/2018 CLINICAL DATA:  Right knee pain after fall. EXAM: PORTABLE RIGHT KNEE - 1-2 VIEW COMPARISON:  None. FINDINGS: No evidence of fracture, dislocation, or joint effusion. No evidence of arthropathy or other focal bone abnormality. Vascular calcifications are noted. IMPRESSION: Negative. Electronically Signed   By: Marijo Conception, M.D.    On: 06/26/2018 16:50   Dg C-arm 1-60 Min  Result Date: 06/27/2018 CLINICAL DATA:  73 year old female with right hip fracture EXAM: DG C-ARM 61-120 MIN; OPERATIVE LEFT HIP WITH PELVIS COMPARISON:  06/26/2010 FINDINGS: Limited intraoperative fluoroscopic spot images during right hip arthroplasty. IMPRESSION: Intraoperative fluoroscopic spot images for right hip arthroplasty. Please refer to the dictated operative report for full details of intraoperative findings and procedure. Electronically Signed   By: Corrie Mckusick D.O.   On: 06/27/2018 12:15   Dg Hip Operative Unilat W Or W/o Pelvis Left  Result Date: 06/27/2018 CLINICAL DATA:  73 year old female with right hip fracture EXAM: DG C-ARM 61-120 MIN; OPERATIVE LEFT HIP WITH PELVIS COMPARISON:  06/26/2010 FINDINGS: Limited intraoperative fluoroscopic spot images during right hip arthroplasty. IMPRESSION: Intraoperative fluoroscopic  spot images for right hip arthroplasty. Please refer to the dictated operative report for full details of intraoperative findings and procedure. Electronically Signed   By: Corrie Mckusick D.O.   On: 06/27/2018 12:15   Dg Femur, Min 2 Views Right  Result Date: 06/26/2018 CLINICAL DATA:  PER EMS, Pt lives ay [place] and fell in the bathroom and hit the back of her head. Pt also has lac to back of head . Pt reports pain to RT hip . EXAM: RIGHT FEMUR 2 VIEWS COMPARISON:  None. FINDINGS: Non comminuted fracture of the right femoral neck. No significant fracture displacement. There is valgus fracture angulation. No other fractures.  Hip and knee joints are normally aligned. Soft tissues are unremarkable. IMPRESSION: 1. Nondisplaced, non comminuted, valgus angulated fracture of the right femoral neck. 2. No other fractures.  No dislocation. Electronically Signed   By: Lajean Manes M.D.   On: 06/26/2018 14:54    CBC Recent Labs  Lab 06/26/18 1506 06/27/18 1134 06/28/18 0448 06/29/18 0222 06/30/18 0341 07/01/18 0848  WBC  23.3*  --  19.8* 15.1* 15.2* 15.1*  HGB 15.2* 9.5* 7.6* 7.3* 7.2* 6.7*  HCT 46.1* 28.0* 23.3* 22.2* 22.0* 20.2*  PLT 398  --  163 181 197 268  MCV 90.7  --  90.0 88.4 89.1 87.8  MCH 29.9  --  29.3 29.1 29.1 29.1  MCHC 33.0  --  32.6 32.9 32.7 33.2  RDW 13.2  --  13.3 13.7 13.7 13.8  LYMPHSABS 2.4  --   --   --   --   --   MONOABS 1.7*  --   --   --   --   --   EOSABS 0.1  --   --   --   --   --   BASOSABS 0.1  --   --   --   --   --     Chemistries  Recent Labs  Lab 06/26/18 1506 06/27/18 1134 06/28/18 0206 07/01/18 0415  NA 136 135 132* 133*  K 3.9 3.7 3.7 3.5  CL 99  --  102 105  CO2 24  --  17* 22  GLUCOSE 135* 133* 166* 134*  BUN 7*  --  10 14  CREATININE 0.96  --  1.00 0.78  CALCIUM 9.3  --  7.9* 7.8*    Lab Results  Component Value Date   HGBA1C 5.2 02/27/2016   Coagulation profile Recent Labs  Lab 06/26/18 2047  INR 1.0   -----------------------------------------------------------------------------------------------------------------    Component Value Date/Time   BNP 38.4 02/09/2013 1430     Ivette Castronova M.D on 07/01/2018 at 4:27 PM  Go to [email or web address] - for contact info  [facility] - Office  [phone]

## 2018-07-01 NOTE — TOC Progression Note (Signed)
Transition of Care [facility]) - Progression Note    Patient Details  Name: [name] MRN: [id number] Date of Birth: 01/07/1946  Transition of Care [facility]) CM/SW [place], [place] Phone Number: 07/01/2018, 11:34 AM  Clinical Narrative:    CSW spoke with Devonna with [facility]. Updated her that plan is for pt to d/c to SNF rehab before returning to [facility]. [facility] will d/c pt and then resume care at pt family request when she returns to [facility].   Pt daughter and CSW also spoke via phone- she is in agreement with SNF, states she is okay with search expanding to [facility], also asked about [facility], will f/u with those two SNFs and move forward. PASRR still pending, will need to submit documents prior to d/c. Pt also will need insurance approval as [employer] is not waiving auth at this time.    Expected Discharge Plan: [place] Barriers to Discharge: Continued Medical Work up, [place] Forensic scientist)  Expected Discharge Plan and Services Expected Discharge Plan: [place] In-house Referral: Hospice / [place] Choice: [place] arrangements for the past 2 months: [place]: [facility] Services(Hospice)   Social Determinants of Health (SDOH) Interventions    Readmission Risk Interventions Readmission Risk Prevention Plan 06/27/2018  Transportation Screening Complete  PCP or Specialist Appt within 3-5 Days Complete  HRI or Home Care Consult Complete  Social Work Consult for [place] Planning/Counseling Complete  Palliative Care Screening Complete  Medication Review Press photographer) Complete  Some recent data might be hidden

## 2018-07-02 ENCOUNTER — Inpatient Hospital Stay (HOSPITAL_COMMUNITY): Payer: Medicare Other

## 2018-07-02 LAB — TYPE AND SCREEN
ABO/RH(D): O POS
Antibody Screen: NEGATIVE
Unit division: 0

## 2018-07-02 LAB — CBC
HCT: 26.4 % — ABNORMAL LOW (ref 36.0–46.0)
Hemoglobin: 8.9 g/dL — ABNORMAL LOW (ref 12.0–15.0)
MCH: 28.5 pg (ref 26.0–34.0)
MCHC: 33.7 g/dL (ref 30.0–36.0)
MCV: 84.6 fL (ref 80.0–100.0)
Platelets: 277 10*3/uL (ref 150–400)
RBC: 3.12 MIL/uL — ABNORMAL LOW (ref 3.87–5.11)
RDW: 15.5 % (ref 11.5–15.5)
WBC: 14 10*3/uL — ABNORMAL HIGH (ref 4.0–10.5)
nRBC: 0.1 % (ref 0.0–0.2)

## 2018-07-02 LAB — BASIC METABOLIC PANEL
Anion gap: 8 (ref 5–15)
BUN: 15 mg/dL (ref 8–23)
CO2: 21 mmol/L — ABNORMAL LOW (ref 22–32)
Calcium: 8 mg/dL — ABNORMAL LOW (ref 8.9–10.3)
Chloride: 106 mmol/L (ref 98–111)
Creatinine, Ser: 0.78 mg/dL (ref 0.44–1.00)
GFR calc Af Amer: >60 mL/min (ref >60)
GFR calc non Af Amer: >60 mL/min (ref >60)
Glucose, Bld: 117 mg/dL — ABNORMAL HIGH (ref 70–99)
Potassium: 3.6 mmol/L (ref 3.5–5.1)
Sodium: 135 mmol/L (ref 135–145)

## 2018-07-02 LAB — TSH: TSH: 1.54 u[IU]/mL (ref 0.350–4.500)

## 2018-07-02 LAB — BPAM RBC
Blood Product Expiration Date: 202004252359
ISSUE DATE / TIME: 202004071137
Unit Type and Rh: 5100

## 2018-07-02 NOTE — Progress Notes (Signed)
Attending cardiology review of echo order:  Due to the spread of COVID-19, departmental policy is to review orders for procedures and determine appropriateness regarding testing at this time. The following criteria are being used to limit potential exposure and spread of infection.  Is the patient being evaluated for COVID-19 infection: no  Is the patient actively having infectious respiratory symptoms or fever: none documented Does the patient have a known history of prior cardiovascular disease: prior echo/monitor unremarkable Would the test change management of the patient: not likely Can the test be performed at a later time: yes, as outpatient  Special circumstances: Is the patient undergoing evaluation for embolic CVA: no Is the patient planned for chemotherapy: no  Based on the review above, this study is not to be performed at this time. Her heart rate has improved, appears consistent with sinus rhythm. Patient is planned for hospice care, unclear that echo would change management at this time. This can be re-evaluated as an outpatient.  Buford Dresser, MD, PhD [facility]  [address] [place], [place] [address] [phone]

## 2018-07-02 NOTE — TOC Progression Note (Addendum)
Transition of Care [facility]) - Progression Note    Patient Details  Name: ZOEIE RITTER MRN: [id number] Date of Birth: [birth year]  Transition of Care [facility]) CM/SW [place], [place] Phone Number: 07/02/2018, 11:02 AM  Clinical Narrative:    2:21pm- Spoke with MD, pt will not d/c today. Pt daughter aware and amenable. Updated SNF with this information.   11:02am-Pt has been authorized for d/c to SNF.  Paged MD regarding medical readiness.   Expected Discharge Plan: [place] Barriers to Discharge: Barriers Resolved, Awaiting State Approval (PASRR)  Expected Discharge Plan and Services Expected Discharge Plan: [place] In-house Referral: Hospice / [place] Choice: [place] arrangements for the past 2 months: [place]: [facility](Hospice)   Social Determinants of Health (SDOH) Interventions    Readmission Risk Interventions Readmission Risk Prevention Plan 06/27/2018  Transportation Screening Complete  PCP or Specialist Appt within 3-5 Days Complete  HRI or Home Care Consult Complete  Social Work Consult for [place] Planning/Counseling Complete  Palliative Care Screening Complete  Medication Review Press photographer) Complete  Some recent data might be hidden

## 2018-07-02 NOTE — Progress Notes (Signed)
PROGRESS NOTE    [name]  XTK:[id number] DOB: 05-01-1945 DOA: 06/26/2018 PCP: Jeanette Caprice, PA-C   Brief Narrative: 73 y.o.femalewith medical history significant ofCOPD, depression, gastroesophageal reflux disease, hypertension, previous thrombotic stroke, and vascular dementia previously under hosp[facility] who was admitted on 06/26/2018 after a fall with closed head injury/hematoma, right-sided rib fractures #3, 4, 5 and 6 and right hip fracture, ??  Dysphagia/swallowing issues, -Required 1 unit of packed cells on 07/01/2018 awaiting SNF placement.  Assessment & Plan:   Principal Problem:   Fracture of femoral neck, right, closed ([place]) Active Problems:   Essential hypertension   Hospice care patient   Multiple fractures of ribs, right side, initial encounter for closed fracture   Hematoma of right parietal scalp   Leukocytosis   Inadequate pain control   Hip fracture ([name])   Displaced fracture of right femoral neck ([name])   Acute blood loss as cause of postoperative anemia   1) Advanced Dementia- Hospice care patient: She is followed by [facility] out of [place], resides at [place] home. Zyprexa 7.5 mg BID Remeron 30 mg nightly Ativan prn agitation/restlessness   2)Rt Hip Fx/right femoral neck fracture--- status postRighttotal hip arthroplasty on 06/27/18 by Dr. [name] , continue physical therapy Lovenox for DVT ppx  # Acute Hypoxic Respiratory Failure: currently with mild O2 requirement, but no increased WOB or c/o SOB.  Discussed wean O2 as tolerated with nursing.  If unable to wean, consider f/u CXR.  # Sinus tachycardia: unclear etiology at this point, pt pain seems well controlled.  Follow TSH and echo.    4)Social/Ethics--DNR/DNI, daughter/POA  is [name] [phone]  3)Multiple right-sided rib fractures--- ribs 3, 4 5 and 6--- pain control appears adequate  4)Dysphagia/FTT-- ??  Aspiration risk, swallowing difficulty  concerns, after discussion with patient's daughter by prior attending she would like to try mechanically soft diet/dysphagia 3,  rather than pured diet/dysphagia 1, as per daughter patient is unlikely to do well on pured diet, prior to admission she already had trouble with failure to thrive and poor oral intake, patient and daughter understands aspiration risk but she want to try pleasure feeding with mechanically soft diet/dysphagia 3 diet ....  speech/swallow evaluation appreciated,  5)Acute blood loss symptomatic anemia in the postop patient---- - S/p 1 unit pRBC on 4/7 - Hb today 8.9, improved from 6.7 on 4/7 - Continue to monitor, Hb on admission was 15.2 -> 9.5   6) acute urinary retention in the postop patient --- as per patient's daughter in the past patient has had recurrent episodes of urinary retention, c/n  Flomax 0.4 daily  7)Persistent Leukocytosis---- blood cx NTD, Resp Panel is Neg  DVT prophylaxis: lovenox Code Status: DNR Family Communication: discussed with daughter, likely plan for d/c tomorrow to SNF.  Overall goal is comfort.  Today pt with mild hypoxia and tachycardia, plan for w/u as noted above prior to d/c. Disposition Plan: hopefully d/c to SNF tomorrow   Consultants:   orthopedics  Procedures:  Right total hip arthroplasty 4/3  Antimicrobials: Anti-infectives (From admission, onward)   Start     Dose/Rate Route Frequency Ordered Stop   06/27/18 1345  vancomycin (VANCOCIN) 1,000 mg in sodium chloride 0.9 % 250 mL IVPB     1,000 mg 250 mL/hr over 60 Minutes Intravenous Every 12 hours 06/27/18 1342 06/27/18 1620   06/27/18 0900  ceFAZolin (ANCEF) IVPB 2g/100 mL premix     2 g 200 mL/hr over 30 Minutes Intravenous  To [facility] 06/26/18 2139 06/27/18 1008   06/27/18 0900  vancomycin (VANCOCIN) IVPB 1000 mg/200 mL premix  Status:  Discontinued     1,000 mg 200 mL/hr over 60 Minutes Intravenous On call to O.R. 06/27/18 7619 06/27/18 0851    06/27/18 0845  vancomycin (VANCOCIN) IVPB 1000 mg/200 mL premix     1,000 mg 200 mL/hr over 60 Minutes Intravenous  Once 06/27/18 0839 06/27/18 0958         Subjective: Denies pain or SOB. Notes she's cold.   Objective: Vitals:   07/01/18 1707 07/01/18 2026 07/02/18 0425 07/02/18 0917  BP: (!) 167/84 (!) 155/91 (!) 150/82 (!) 167/87  Pulse: (!) 112 (!) 117 (!) 107 100  Resp:  20 19 (!) 24  Temp: 98.6 F (37 C) (!) 97.5 F (36.4 C) 98.1 F (36.7 C) 98 F (36.7 C)  TempSrc: Oral Oral Oral Oral  SpO2: 99% 98% 96% 99%  Weight:      Height:        Intake/Output Summary (Last 24 hours) at 07/02/2018 1421 Last data filed at 07/02/2018 1000 Gross per 24 hour  Intake 702.24 ml  Output -  Net 702.24 ml   Filed Weights   06/26/18 1247  Weight: 40.8 kg    Examination:  General exam: Appears calm and comfortable  Respiratory system: Clear to auscultation. Respiratory effort normal. Cardiovascular system: S1 & S2 heard, RRR.  TTP to R side of chest Gastrointestinal system: Abdomen is nondistended, soft and nontender.  Central nervous system: Alert and oriented. No focal neurological deficits. Extremities: RLE dressing intact Skin: No rashes, lesions or ulcers Psychiatry: Judgement and insight appear normal. Mood & affect appropriate.     Data Reviewed: I have personally reviewed following labs and imaging studies  CBC: Recent Labs  Lab 06/26/18 1506  06/28/18 0448 06/29/18 0222 06/30/18 0341 07/01/18 0848 07/02/18 0436  WBC 23.3*  --  19.8* 15.1* 15.2* 15.1* 14.0*  NEUTROABS 18.7*  --   --   --   --   --   --   HGB 15.2*   < > 7.6* 7.3* 7.2* 6.7* 8.9*  HCT 46.1*   < > 23.3* 22.2* 22.0* 20.2* 26.4*  MCV 90.7  --  90.0 88.4 89.1 87.8 84.6  PLT 398  --  163 181 197 268 277   < > = values in this interval not displayed.   Basic Metabolic Panel: Recent Labs  Lab 06/26/18 1506 06/27/18 1134 06/28/18 0206 07/01/18 0415 07/02/18 0436  NA 136 135 132* 133* 135   K 3.9 3.7 3.7 3.5 3.6  CL 99  --  102 105 106  CO2 24  --  17* 22 21*  GLUCOSE 135* 133* 166* 134* 117*  BUN 7*  --  10 14 15   CREATININE 0.96  --  1.00 0.78 0.78  CALCIUM 9.3  --  7.9* 7.8* 8.0*   GFR: Estimated Creatinine Clearance: 39.7 mL/min (by C-G formula based on SCr of 0.78 mg/dL). Liver Function Tests: No results for input(s): AST, ALT, ALKPHOS, BILITOT, PROT, ALBUMIN in the last 168 hours. No results for input(s): LIPASE, AMYLASE in the last 168 hours. No results for input(s): AMMONIA in the last 168 hours. Coagulation Profile: Recent Labs  Lab 06/26/18 2047  INR 1.0   Cardiac Enzymes: No results for input(s): CKTOTAL, CKMB, CKMBINDEX, TROPONINI in the last 168 hours. BNP (last 3 results) No results for input(s): PROBNP in the last 8760 hours. HbA1C: No results  for input(s): HGBA1C in the last 72 hours. CBG: No results for input(s): GLUCAP in the last 168 hours. Lipid Profile: No results for input(s): CHOL, HDL, LDLCALC, TRIG, CHOLHDL, LDLDIRECT in the last 72 hours. Thyroid Function Tests: No results for input(s): TSH, T4TOTAL, FREET4, T3FREE, THYROIDAB in the last 72 hours. Anemia Panel: No results for input(s): VITAMINB12, FOLATE, FERRITIN, TIBC, IRON, RETICCTPCT in the last 72 hours. Sepsis Labs: No results for input(s): PROCALCITON, LATICACIDVEN in the last 168 hours.  Recent Results (from the past 240 hour(s))  Surgical pcr screen     Status: Abnormal   Collection Time: 06/26/18 10:22 PM  Result Value Ref Range Status   MRSA, PCR POSITIVE (A) NEGATIVE Final    Comment: RESULT CALLED TO, READ BACK BY AND VERIFIED WITH: N CAGUIOA RN 06/27/18 0015 JDW    Staphylococcus aureus POSITIVE (A) NEGATIVE Final    Comment: (NOTE) The Xpert SA Assay (FDA approved for NASAL specimens in patients 65 years of age and older), is one component of a comprehensive surveillance program. It is not intended to diagnose infection nor to guide or monitor treatment.  Performed at [facility], [place] [address] [place], [place] [address]          Radiology Studies: No results found.      Scheduled Meds: . acetaminophen  650 mg Oral Q6H  . atorvastatin  20 mg Oral q1800  . B-complex with vitamin C  1 tablet Oral Daily  . enoxaparin (LOVENOX) injection  30 mg Subcutaneous Q24H  . feeding supplement (ENSURE ENLIVE)  237 mL Oral q morning - 10a  . feeding supplement (PRO-STAT SUGAR FREE 64)  30 mL Oral BID WC  . gabapentin  300 mg Oral QHS  . losartan  100 mg Oral Daily  . methocarbamol  500 mg Oral BID  . mirtazapine  30 mg Oral QHS  . OLANZapine  7.5 mg Oral BID  . senna-docusate  2 tablet Oral BID  . tamsulosin  0.4 mg Oral QPC supper  . vitamin C  500 mg Oral Daily   Continuous Infusions: . dextrose 5 % and 0.9% NaCl 20 mL/hr at 07/01/18 1505     LOS: 6 days    Time spent: over 30 min    Fayrene Helper, MD [facility] Pager AMION  If 7PM-7AM, please contact night-coverage [email or web address] Password [email or web address] 07/02/2018, 2:21 PM

## 2018-07-02 NOTE — Progress Notes (Signed)
  Speech Language Pathology Treatment: Dysphagia  Patient Details Name: [name] MRN: [id number] DOB: 09-07-1945 Today's Date: 07/02/2018 Time: 1340-1350 SLP Time Calculation (min) (ACUTE ONLY): 10 min  Assessment / Plan / Recommendation Clinical Impression  Pt seen at bedside to assess tolerance of dys 3 diet with thin liquids. Pt diet upgraded by MD due to Pt's family request.  Pt demonstrate wet vocal quality at baseline. Unable to elicit cough or throat clear despite mod cues. Pt initially refused water, but accepted 3 bites of graham cracker. Mastication was slow, likely due to missing dentition. Took one sip of water via straw. No overt s/s of aspiration observed. Pt continues to be at risk for aspiration due to history of COPD, GERD, and dementia. Family understands aspiration risk, but overall goal is comfort and would like to do pleasure feeding. ST will sign off at this time.    HPI HPI: 73 year old female admitted 06/26/2018 after a fall at SNF. Pt sustained right femoral neck fracture. Pt underwent surgery 06/27/2018. PMH: carotid artery aneurysm, COPD, depression, DJD, GERD, HTN, CVA, vascular dementia.      SLP Plan  Discharge SLP treatment due to (comment)(goals of care)       Recommendations  Diet recommendations: Dysphagia 3 (mechanical soft);Thin liquid Liquids provided via: Straw Medication Administration: Crushed with puree Supervision: Full supervision/cueing for compensatory strategies Compensations: Slow rate;Small sips/bites;Minimize environmental distractions Postural Changes and/or Swallow Maneuvers: Seated upright 90 degrees;Upright 30-60 min after meal                Oral Care Recommendations: Oral care BID Follow up Recommendations: 24 hour supervision/assistance SLP Visit Diagnosis: Dysphagia, unspecified (R13.10) Plan: Discharge SLP treatment due to (comment)(goals of care)       GO                Laurey Arrow 07/02/2018, 2:57 PM  Melody Haver, M.Ed., [facility] [phone]

## 2018-07-03 DIAGNOSIS — Z23 Encounter for immunization: Secondary | ICD-10-CM | POA: Diagnosis not present

## 2018-07-03 LAB — CBC
HCT: 24.8 % — ABNORMAL LOW (ref 36.0–46.0)
Hemoglobin: 8.3 g/dL — ABNORMAL LOW (ref 12.0–15.0)
MCH: 28.6 pg (ref 26.0–34.0)
MCHC: 33.5 g/dL (ref 30.0–36.0)
MCV: 85.5 fL (ref 80.0–100.0)
Platelets: 353 10*3/uL (ref 150–400)
RBC: 2.9 MIL/uL — ABNORMAL LOW (ref 3.87–5.11)
RDW: 15.5 % (ref 11.5–15.5)
WBC: 15 10*3/uL — ABNORMAL HIGH (ref 4.0–10.5)
nRBC: 0 % (ref 0.0–0.2)

## 2018-07-03 LAB — BASIC METABOLIC PANEL
Anion gap: 8 (ref 5–15)
BUN: 13 mg/dL (ref 8–23)
CO2: 24 mmol/L (ref 22–32)
Calcium: 8.1 mg/dL — ABNORMAL LOW (ref 8.9–10.3)
Chloride: 107 mmol/L (ref 98–111)
Creatinine, Ser: 0.71 mg/dL (ref 0.44–1.00)
GFR calc Af Amer: >60 mL/min (ref >60)
GFR calc non Af Amer: >60 mL/min (ref >60)
Glucose, Bld: 110 mg/dL — ABNORMAL HIGH (ref 70–99)
Potassium: 3.2 mmol/L — ABNORMAL LOW (ref 3.5–5.1)
Sodium: 139 mmol/L (ref 135–145)

## 2018-07-03 LAB — MAGNESIUM: Magnesium: 1.7 mg/dL (ref 1.7–2.4)

## 2018-07-03 MED ORDER — TAMSULOSIN HCL 0.4 MG PO CAPS: 0.4000 mg | ORAL_CAPSULE | Freq: Every day | ORAL | 0 refills | Status: AC

## 2018-07-03 MED ORDER — TRAMADOL HCL 50 MG PO TABS: 50.0000 mg | ORAL_TABLET | Freq: Two times a day (BID) | ORAL | 0 refills | Status: AC | PRN

## 2018-07-03 MED ORDER — POTASSIUM CHLORIDE 10 MEQ/100ML IV SOLN
10.0000 meq | INTRAVENOUS | Status: AC
  Administered 2018-07-03 (×3): 10 meq via INTRAVENOUS
  Filled 2018-07-03 (×3): qty 100

## 2018-07-03 MED ORDER — ASPIRIN EC 81 MG PO TBEC: 81.0000 mg | DELAYED_RELEASE_TABLET | Freq: Two times a day (BID) | ORAL | 11 refills | Status: DC

## 2018-07-03 MED ORDER — POTASSIUM CHLORIDE CRYS ER 20 MEQ PO TBCR: 40.0000 meq | EXTENDED_RELEASE_TABLET | Freq: Once | ORAL | Status: DC

## 2018-07-03 MED ORDER — LORAZEPAM 0.5 MG PO TABS: 0.5000 mg | ORAL_TABLET | ORAL | 0 refills | Status: AC | PRN

## 2018-07-03 MED ORDER — POTASSIUM CHLORIDE 20 MEQ PO PACK
40.0000 meq | PACK | Freq: Once | ORAL | Status: AC
  Administered 2018-07-03: 40 meq via ORAL
  Filled 2018-07-03: qty 2

## 2018-07-03 NOTE — Social Work (Signed)
Clinical Social Worker facilitated patient discharge including contacting patient family and facility to confirm patient discharge plans.  Clinical information faxed to facility and family agreeable with plan.  CSW arranged ambulance transport via [facility] to [facility] at [place]. RN to call [phone]  with report prior to discharge.  Clinical Social Worker will sign off for now as social work intervention is no longer needed. Please consult [place] again if new need arises.  Westley Hummer, MSW, [place] Social Worker [phone]

## 2018-07-03 NOTE — Progress Notes (Signed)
Physical Therapy Treatment Patient Details Name: [name] MRN: [id number] DOB: 1945-06-20 Today's Date: 07/03/2018    History of Present Illness [name] is a 73 y.o. female with medical history significant of vascular dementia, prior stroke, HTN, COPD, falls.  Patient resides in ALF;  admitted  with fall, right rib fxs, right femur fx s/p anterior hip arthroplasty.     PT Comments    Patient seen for mobility progression. Pt requires +2 for bed mobility and OOB transfers. Continue to progress as tolerated with anticipated d/c to SNF for further skilled PT services.     Follow Up Recommendations  SNF     Equipment Recommendations  None recommended by PT    Recommendations for Other Services       Precautions / Restrictions Precautions Precautions: Fall Restrictions Weight Bearing Restrictions: Yes RLE Weight Bearing: Weight bearing as tolerated    Mobility  Bed Mobility Overal bed mobility: Needs Assistance Bed Mobility: Supine to Sit     Supine to sit: Min assist;+2 for physical assistance;HOB elevated     General bed mobility comments: assist to elevate trunk into sitting and to bring hips to EOB  Transfers Overall transfer level: Needs assistance Equipment used: Rolling walker (2 wheeled) Transfers: Sit to/from [facility] Sit to Stand: Mod assist;From elevated surface;+2 physical assistance Stand pivot transfers: Mod assist;+2 safety/equipment       General transfer comment: pt initiated standing when RW placed in front of her; assistance required to power up into standing and for balance in standing; pt with heavy posterior bias; pt began pivoting to recliner and requires assistance for balance/weight shifting and managing RW; multimodal cues for upright posture and vc for safe use of AD  Ambulation/Gait                 Stairs             Wheelchair Mobility    Modified Rankin (Stroke Patients Only)        Balance Overall balance assessment: Needs assistance Sitting-balance support: Feet supported;Bilateral upper extremity supported Sitting balance-Leahy Scale: Poor     Standing balance support: Bilateral upper extremity supported Standing balance-Leahy Scale: Poor                              Cognition Arousal/Alertness: Awake/alert Behavior During Therapy: Agitated(states "no" to most questions) Overall Cognitive Status: No family/caregiver present to determine baseline cognitive functioning                                 General Comments: pt gets very agitated when attempting to help mobilize however once sitting up begins to initiate tasks      Exercises      General Comments General comments (skin integrity, edema, etc.): pt with productive cough after sitting up EOB; encouraged pt to use IS but pt refused trying       Pertinent Vitals/Pain Pain Assessment: Faces Faces Pain Scale: No hurt    Home Living                      Prior Function            PT Goals (current goals can now be found in the care plan section) Progress towards PT goals: Progressing toward goals    Frequency    Min 2X/week  PT Plan Current plan remains appropriate;Frequency needs to be updated    Co-evaluation              AM-PAC PT "6 Clicks" Mobility   Outcome Measure  Help needed turning from your back to your side while in a flat bed without using bedrails?: A Little Help needed moving from lying on your back to sitting on the side of a flat bed without using bedrails?: A Little Help needed moving to and from a bed to a chair (including a wheelchair)?: A Lot Help needed standing up from a chair using your arms (e.g., wheelchair or bedside chair)?: A Lot Help needed to walk in hospital room?: A Lot Help needed climbing 3-5 steps with a railing? : Total 6 Click Score: 13    End of Session Equipment Utilized During Treatment:  Oxygen;Gait belt Activity Tolerance: Patient tolerated treatment well Patient left: with call bell/phone within reach;in chair;with chair alarm set Nurse Communication: Mobility status PT Visit Diagnosis: Repeated falls (R29.6);Muscle weakness (generalized) (M62.81);Difficulty in walking, not elsewhere classified (R26.2)     Time: 0626-9485 PT Time Calculation (min) (ACUTE ONLY): 18 min  Charges:  $Gait Training: 8-22 mins                     Earney Navy, PTA Acute Rehabilitation Services Pager: [phone] Office: [phone]     Darliss Cheney 07/03/2018, 1:14 PM

## 2018-07-03 NOTE — Discharge Summary (Signed)
Physician Discharge Summary  [name]:[id number] DOB: 10-28-1945 DOA: 06/26/2018  PCP: Raven Caprice, PA-C  Admit date: 06/26/2018 Discharge date: 07/03/2018  Time spent: over 30 minutes  Recommendations for Outpatient Follow-up:  1. Follow outpatient CBC/CMP 2. Continue pain control for rib fractures and hip fracture s/p repair as needed.  Pt currently doing well with tylenol.   Received tramadol last on 4/6. 3. Ensure pt continues to use incentive spirometry and flutter valve in setting of her rib fractures.  Consider repeat CXR as outpatient per [name] given opacity noted on CXR on 4/8.   4. Continue ASA 81 mg BID for DVT ppx per ortho.  Follow up with orthopedics within 2 weeks. 5. Follow tachycardia outpatient.  Mild tachycardia, though she appears mostly asymptomatic from this standpoint. 6. Pt previously with Hospice with [name].  Palliative Care should follow while she's at SNF.  Discharge Diagnoses:  Principal Problem:   Fracture of femoral neck, right, closed ([place]) Active Problems:   Essential hypertension   Hospice care patient   Multiple fractures of ribs, right side, initial encounter for closed fracture   Hematoma of right parietal scalp   Leukocytosis   Inadequate pain control   Hip fracture ([name])   Displaced fracture of right femoral neck ([name])   Acute blood loss as cause of postoperative anemia   Discharge Condition: stable  Diet recommendation: mechanical soft/dysphagia 3 for comfort (risks discussed with daughter/pt by prior MD)  Filed Weights   06/26/18 1247  Weight: 40.8 kg    History of present illness:  73 y.o.femalewith medical history significant ofCOPD, depression, gastroesophageal reflux disease, hypertension, previous thrombotic stroke, and vascular dementia previously under hospice care at [name] home who was admitted on 06/26/2018 after [name] fall with closed head injury/hematoma, right-sided rib fractures #3, 4, 5 and 6 and right hip  fracture.  She's now s/p right total hip arthroplasty on 4/3.  Her post op course has been complicated by dysphagia and swallowing issues.  She was seen by speech who recommended dysphagia 1 diet initially, but after discussion with previous hospitalist, decided to advance to mechanical soft for comfort.  Post op course also complicated by anemia requiring 1 unit pRBC.  She's been intermittently tachycardic since admission, but asymptomatic.  She was weaned to RA on day of discharge.  Plan for discharge to SNF with palliative care to follow and ultimately resumption of hospice after SNF stay.   See below for additional details  Hospital Course:  1) Advanced Dementia- Hospice care patient: She is followed by [name] out of [name], resides at [name] home. Zyprexa 7.5 mg BID Remeron 30 mg nightly Ativan prn agitation/restlessness  Planning for d/c to SNF, will have palliative follow at SNF, hospice to resume once pt d/c'd from SNF  2)Rt Hip Fx/right femoral neck fracture--- status postRighttotal hip arthroplasty on 06/27/18 by Dr. Lyla Bolton , continue physical therapy Discussed with Dr. Lyla Bolton prior to d/c, plan for ASA BID 81 mg at d/c  # Acute Hypoxic Respiratory Failure: currently with mild O2 requirement, but no increased WOB or c/o SOB.  Repeat CXR yesterday with hazy opacity at R lung base (atelectasis vs pleural fluid vs pulmonary contusion, etc).  Continue pain control (adjust as needed after discharge) to help reduce splinting and continue incentive spirometry and flutter valve for pulmonary toilet.  Continue to monitor.  # Sinus tachycardia: unclear etiology at this point, pt pain seems well controlled.  TSH was wnl.    Echo unable to  be performed due to COVID 19 policy, case reviewed by cards who recommended this be performed as outpatient at this time.  At this time, given her lack of symptoms related to tachycardia and generally mild (fluctuating between 90's and low  100's to 110's), will recommend continued monitoring as an outpatient and follow up as indicated and per goals of care.  4)Social/Ethics--DNR/DNI, daughter/POA is [name] [phone].  Overall goal is comfort based on discussions with daughter.  3)Multiple right-sided rib fractures--- ribs 3, 4 5 and 6--- pain control appears adequate at this time.  Close attention to pain control in follow up as outpatient.  4)Dysphagia/FTT--  Concern for aspiration.  Seen by speech, initially recommending pureed diet.  After discussion with patient's daughter by prior attending she would like to try mechanically soft diet/dysphagia 3, rather than pured diet/dysphagia 1, as per daughter patient is unlikely to do well on pured diet, prior to admission she already had trouble with failure to thrive and poor oral intake, patientanddaughter understands aspiration risk but she want to try pleasure feeding with mechanically soft diet/dysphagia 3 diet .  5)Acute blood losssymptomaticanemia in the postop patient---- - S/p 1 unit pRBC on 4/7 - Hb 8.3 today, relatively stable and appropriate bump after transfusion, continue to follow outpatient - Continue to monitor, Hb on admission was 15.2 -> 9.5   6) acute urinary retention in the postop patient --- as per patient's daughter in the past patient has had recurrent episodes of urinary retention, c/n Flomax 0.4 daily  7)Persistent Leukocytosis---- blood cx NTD, Resp Panel is Neg. Follow outpatient.  Procedures: Right total hip arthroplasty 4/3  Consultations:  orthopedics  Discharge Exam: Vitals:   07/02/18 2351 07/03/18 0428  BP: (!) 153/83 140/81  Pulse: (!) 107 (!) 105  Resp: 14 14  Temp: 98 F (36.7 C) 98.2 F (36.8 C)  SpO2: 99% 100%   No complaints. Watching television. Denies pain or SOB. [name]&Ox1-2 (knew hospital, [name], but couldn't say month or specific hospital)  General: No acute distress. Cardiovascular: Heart  sounds show [name] tachycardic rate Lungs: Clear to auscultation bilaterally  Abdomen: Soft, nontender, nondistended Neurological: Alert and oriented 1-2. Moves all extremities 4. Cranial nerves II through XII grossly intact. Skin: Warm and dry. No rashes or lesions. Extremities: RLE with intact dressing  Discharge Instructions   Discharge Instructions    Call MD for:  difficulty breathing, headache or visual disturbances   Complete by:  As directed    Call MD for:  extreme fatigue   Complete by:  As directed    Call MD for:  hives   Complete by:  As directed    Call MD for:  persistant dizziness or light-headedness   Complete by:  As directed    Call MD for:  persistant nausea and vomiting   Complete by:  As directed    Call MD for:  redness, tenderness, or signs of infection (pain, swelling, redness, odor or green/yellow discharge around incision site)   Complete by:  As directed    Call MD for:  severe uncontrolled pain   Complete by:  As directed    Call MD for:  temperature >100.4   Complete by:  As directed    Diet - low sodium heart healthy   Complete by:  As directed    Discharge instructions   Complete by:  As directed    You were seen for Micky Sheller fall with right hip fracture and rib fractures.  You've been treated surgically by orthopedics.  We're going to have palliative care follow you while you're at the skilled nursing facility and then resume hospice services after you're discharged.  Please continue to use the incentive spirometer and flutter valve to help keep your lungs open.   Your heart rate was Shoichi Mielke little fast when you were discharged.  This should be followed as an outpatient at the skilled facility.  Return for new, recurrent, or worsening symptoms.  Please ask your PCP to request records from this hospitalization so they know what was done and what the next steps will be.   Increase activity slowly   Complete by:  As directed      Allergies as of  07/03/2018      Reactions   Amoxapine And Related Nausea And Vomiting   Amoxicillin Itching   itching   Ciprocinonide [fluocinolone] Other (See Comments)   Pt unsure   Ciprofloxacin Other (See Comments)   Pt unsure   Doxycycline Nausea And Vomiting   Erythromycin Hives   Sulfa Antibiotics Other (See Comments)   Unknown-reaction as Daymon Hora child   Zofran [ondansetron Hcl] Nausea And Vomiting      Medication List    STOP taking these medications   morphine CONCENTRATE 10 mg / 0.5 ml concentrated solution     TAKE these medications   acetaminophen 500 MG tablet Commonly known as:  TYLENOL Take 500 mg by mouth every 4 (four) hours as needed for mild pain.   aspirin EC 81 MG tablet Take 1 tablet (81 mg total) by mouth 2 (two) times daily.   atorvastatin 20 MG tablet Commonly known as:  LIPITOR Take 1 tablet (20 mg total) by mouth daily at 6 PM.   b complex vitamins tablet Take 1 tablet by mouth daily.   clopidogrel 75 MG tablet Commonly known as:  PLAVIX Take 75 mg by mouth daily.   gabapentin 300 MG capsule Commonly known as:  NEURONTIN Take 300 mg by mouth at bedtime.   guaiFENesin 100 MG/5ML Soln Commonly known as:  ROBITUSSIN Take 10 mLs by mouth every 6 (six) hours as needed for cough or to loosen phlegm.   hyoscyamine 0.125 MG tablet Commonly known as:  LEVSIN, ANASPAZ Take 0.125 mg by mouth See admin instructions. Take 1 tablet by mouth every 2 hours as needed excessive secretions   ipratropium-albuterol 0.5-2.5 (3) MG/3ML Soln Commonly known as:  DUONEB Take 3 mLs by nebulization every 6 (six) hours as needed (wheezing/congestion).   loperamide 2 MG capsule Commonly known as:  IMODIUM Take 2 mg by mouth as needed for diarrhea or loose stools.   LORazepam 0.5 MG tablet Commonly known as:  ATIVAN Take 1 tablet (0.5 mg total) by mouth every 4 (four) hours as needed for up to 1 day for anxiety.   losartan 100 MG tablet Commonly known as:  COZAAR Take 100 mg  by mouth daily.   magnesium hydroxide 400 MG/5ML suspension Commonly known as:  MILK OF MAGNESIA Take 30 mLs by mouth at bedtime as needed for mild constipation.   Mintox 956-213-08 MG/5ML suspension Generic drug:  alum & mag hydroxide-simeth Take 30 mLs by mouth every 6 (six) hours as needed for indigestion or heartburn.   mirtazapine 30 MG tablet Commonly known as:  REMERON Take 30 mg by mouth at bedtime.   OLANZapine 7.5 MG tablet Commonly known as:  ZYPREXA Take 7.5 mg by mouth 2 (two) times daily.   polyethylene glycol 17 g  packet Commonly known as:  MIRALAX / GLYCOLAX Take 17 g by mouth daily. What changed:    when to take this  reasons to take this   tamsulosin 0.4 MG Caps capsule Commonly known as:  FLOMAX Take 1 capsule (0.4 mg total) by mouth daily after supper for 30 days.   traMADol 50 MG tablet Commonly known as:  ULTRAM Take 1 tablet (50 mg total) by mouth 2 (two) times daily as needed for up to 5 days. What changed:  reasons to take this   traZODone 50 MG tablet Commonly known as:  DESYREL Take 50 mg by mouth at bedtime as needed for sleep.   trolamine salicylate 10 % cream Commonly known as:  ASPERCREME Apply 1 application topically 2 (two) times daily as needed for muscle pain.   vitamin C 500 MG tablet Commonly known as:  ASCORBIC ACID Take 500 mg by mouth daily.      Allergies  Allergen Reactions  . Amoxapine And Related Nausea And Vomiting  . Amoxicillin Itching    itching  . Ciprocinonide [Fluocinolone] Other (See Comments)    Pt unsure  . Ciprofloxacin Other (See Comments)    Pt unsure  . Doxycycline Nausea And Vomiting  . Erythromycin Hives  . Sulfa Antibiotics Other (See Comments)    Unknown-reaction as Arlyn Buerkle child  . Zofran [Ondansetron Hcl] Nausea And Vomiting    Contact information for follow-up providers    Swinteck, Aaron Edelman, MD. Schedule an appointment as soon as possible for Kayloni Rocco visit in 2 weeks.   Specialty:  Orthopedic  Surgery Why:  For wound re-check Contact information: [address] [address] [phone]        [name], [name], PA-C Follow up.   Specialty:  Physician Assistant Why:  Call for follow up appointment Contact information: [place] [address] [phone]            Contact information for after-discharge care    Destination    [facility] .   Service:  Skilled Nursing Contact information: [address] [place] [phone]                   The results of significant diagnostics from this hospitalization (including imaging, microbiology, ancillary and laboratory) are listed below for reference.    Significant Diagnostic Studies: Dg Chest 1 View  Result Date: 06/26/2018 CLINICAL DATA:  Fall. EXAM: CHEST  1 VIEW COMPARISON:  04/03/2018 FINDINGS: The heart size and mediastinal contours are within normal limits. There is no evidence of pulmonary edema, consolidation, pneumothorax, nodule or pleural fluid. There appear to be new fractures of the right third, fourth, fifth and sixth ribs demonstrating minimal angulation/displacement. IMPRESSION: New fractures of the right third, fourth, fifth and sixth ribs demonstrating minimal angulation and displacement. No evidence of associated pneumothorax or pleural fluid. Electronically Signed   By: Aletta Edouard M.D.   On: 06/26/2018 16:57   Dg Pelvis 1-2 Views  Result Date: 06/26/2018 CLINICAL DATA:  PER EMS, Pt lives ay [place] and fell in the bathroom and hit the back of her head. Pt also has lac to back of head . Pt reports pain to RT hip . EXAM: PELVIS - 1-2 VIEW COMPARISON:  CT, 09/19/2009. FINDINGS: Fracture of the right femoral neck, subcapital to mid femoral neck, nondisplaced and non comminuted, but with valgus angulation. No other fractures. No bone lesions. Hip joints, SI joints and symphysis  pubis are  normally spaced and aligned. Skeletal structures are demineralized. Soft tissues are unremarkable. IMPRESSION: 1. Nondisplaced, non comminuted, valgus angulated fracture of the right femoral neck. 2. No other fractures.  No dislocation. Electronically Signed   By: Lajean Manes M.D.   On: 06/26/2018 14:55   Ct Head Wo Contrast  Result Date: 06/26/2018 CLINICAL DATA:  Head trauma, headache. Neck pain. EXAM: CT HEAD WITHOUT CONTRAST CT CERVICAL SPINE WITHOUT CONTRAST TECHNIQUE: Multidetector CT imaging of the head and cervical spine was performed following the standard protocol without intravenous contrast. Multiplanar CT image reconstructions of the cervical spine were also generated. COMPARISON:  04/02/2018 FINDINGS: CT HEAD FINDINGS Brain: No evidence of acute infarction, hemorrhage, hydrocephalus, extra-axial collection or mass lesion/mass effect. Generalized atrophy. Hypoattenuation of white matter, likely chronic microvascular ischemic change. Chronic LEFT parietal infarct, significant volume loss. Vascular: Calcification of the cavernous internal carotid arteries consistent with cerebrovascular atherosclerotic disease. No signs of intracranial large vessel occlusion. Skull: Calvarium intact. Sinuses/Orbits: No layering sinus fluid. Negative orbits. Other: Large RIGHT parietal scalp hematoma. CT CERVICAL SPINE FINDINGS Alignment: Normal. Skull base and vertebrae: No acute fracture. No primary bone lesion or focal pathologic process. Soft tissues and spinal canal: No prevertebral fluid or swelling. No visible canal hematoma. BILATERAL carotid bifurcation calcification. Disc levels:  No traumatic or calcified disc herniation. Upper chest: Biapical scarring and emphysematous change, greater on the RIGHT, without progression from January 2020 CT chest. Other: None. IMPRESSION: 1. No skull fracture or intracranial hemorrhage. Large RIGHT parietal scalp hematoma. 2. No cervical spine fracture or traumatic  subluxation. Electronically Signed   By: Staci Righter M.D.   On: 06/26/2018 13:50   Ct Cervical Spine Wo Contrast  Result Date: 06/26/2018 CLINICAL DATA:  Head trauma, headache. Neck pain. EXAM: CT HEAD WITHOUT CONTRAST CT CERVICAL SPINE WITHOUT CONTRAST TECHNIQUE: Multidetector CT imaging of the head and cervical spine was performed following the standard protocol without intravenous contrast. Multiplanar CT image reconstructions of the cervical spine were also generated. COMPARISON:  04/02/2018 FINDINGS: CT HEAD FINDINGS Brain: No evidence of acute infarction, hemorrhage, hydrocephalus, extra-axial collection or mass lesion/mass effect. Generalized atrophy. Hypoattenuation of white matter, likely chronic microvascular ischemic change. Chronic LEFT parietal infarct, significant volume loss. Vascular: Calcification of the cavernous internal carotid arteries consistent with cerebrovascular atherosclerotic disease. No signs of intracranial large vessel occlusion. Skull: Calvarium intact. Sinuses/Orbits: No layering sinus fluid. Negative orbits. Other: Large RIGHT parietal scalp hematoma. CT CERVICAL SPINE FINDINGS Alignment: Normal. Skull base and vertebrae: No acute fracture. No primary bone lesion or focal pathologic process. Soft tissues and spinal canal: No prevertebral fluid or swelling. No visible canal hematoma. BILATERAL carotid bifurcation calcification. Disc levels:  No traumatic or calcified disc herniation. Upper chest: Biapical scarring and emphysematous change, greater on the RIGHT, without progression from January 2020 CT chest. Other: None. IMPRESSION: 1. No skull fracture or intracranial hemorrhage. Large RIGHT parietal scalp hematoma. 2. No cervical spine fracture or traumatic subluxation. Electronically Signed   By: Staci Righter M.D.   On: 06/26/2018 13:50   Pelvis Portable  Result Date: 06/27/2018 CLINICAL DATA:  73 year old female with Borna Wessinger history of right femoral neck fracture EXAM:  PORTABLE PELVIS 1-2 VIEWS COMPARISON:  Preoperative radiographs 06/26/2018 FINDINGS: Yuki Brunsman single frontal view of the pelvis demonstrates interval surgical changes of right total hip arthroplasty. Doratha Mcswain cerclage wire encircles the intertrochanteric region of the femur. Aside from the 2 screws transfixing the acetabular cup, there is Llana Deshazo third metallic fragment measuring 2.1 cm  in length which resembles Tona Qualley drill bit. Expected subcutaneous emphysema in the soft tissues in the region of incision. IMPRESSION: Surgical changes of right total hip arthroplasty as above. Please see operative note for further detail. Electronically Signed   By: Jacqulynn Cadet M.D.   On: 06/27/2018 13:41   Dg Chest Port 1 View  Result Date: 07/02/2018 CLINICAL DATA:  Hypoxia. Right rib fractures. EXAM: PORTABLE CHEST 1 VIEW COMPARISON:  Chest radiograph 06/26/2018 FINDINGS: Increasing hazy opacity at the right lung base from prior exam. Overall lower lung volumes leading to bronchovascular crowding. No visualized pneumothorax. Unchanged heart size and mediastinal contours. Right rib fractures again seen. Bones are under mineralized. IMPRESSION: 1. Increasing hazy opacity at the right lung base from prior exam. This may be atelectasis, pleural fluid, pulmonary contusion or combination thereof given recent right rib fractures. No visualized pneumothorax. 2. Lower lung volumes from prior exam with bronchovascular crowding. Electronically Signed   By: Keith Rake M.D.   On: 07/02/2018 20:33   Dg Knee Right Port  Result Date: 06/26/2018 CLINICAL DATA:  Right knee pain after fall. EXAM: PORTABLE RIGHT KNEE - 1-2 VIEW COMPARISON:  None. FINDINGS: No evidence of fracture, dislocation, or joint effusion. No evidence of arthropathy or other focal bone abnormality. Vascular calcifications are noted. IMPRESSION: Negative. Electronically Signed   By: Marijo Conception, M.D.   On: 06/26/2018 16:50   Dg C-arm 1-60 Min  Result Date: 06/27/2018 CLINICAL  DATA:  73 year old female with right hip fracture EXAM: DG C-ARM 61-120 MIN; OPERATIVE LEFT HIP WITH PELVIS COMPARISON:  06/26/2010 FINDINGS: Limited intraoperative fluoroscopic spot images during right hip arthroplasty. IMPRESSION: Intraoperative fluoroscopic spot images for right hip arthroplasty. Please refer to the dictated operative report for full details of intraoperative findings and procedure. Electronically Signed   By: Corrie Mckusick D.O.   On: 06/27/2018 12:15   Dg Hip Operative Unilat W Or W/o Pelvis Left  Result Date: 06/27/2018 CLINICAL DATA:  73 year old female with right hip fracture EXAM: DG C-ARM 61-120 MIN; OPERATIVE LEFT HIP WITH PELVIS COMPARISON:  06/26/2010 FINDINGS: Limited intraoperative fluoroscopic spot images during right hip arthroplasty. IMPRESSION: Intraoperative fluoroscopic spot images for right hip arthroplasty. Please refer to the dictated operative report for full details of intraoperative findings and procedure. Electronically Signed   By: Corrie Mckusick D.O.   On: 06/27/2018 12:15   Dg Femur, Min 2 Views Right  Result Date: 06/26/2018 CLINICAL DATA:  PER EMS, Pt lives ay [place] and fell in the bathroom and hit the back of her head. Pt also has lac to back of head . Pt reports pain to RT hip . EXAM: RIGHT FEMUR 2 VIEWS COMPARISON:  None. FINDINGS: Non comminuted fracture of the right femoral neck. No significant fracture displacement. There is valgus fracture angulation. No other fractures.  Hip and knee joints are normally aligned. Soft tissues are unremarkable. IMPRESSION: 1. Nondisplaced, non comminuted, valgus angulated fracture of the right femoral neck. 2. No other fractures.  No dislocation. Electronically Signed   By: Lajean Manes M.D.   On: 06/26/2018 14:54    Microbiology: Recent Results (from the past 240 hour(s))  Surgical pcr screen     Status: Abnormal   Collection Time: 06/26/18 10:22 PM  Result Value Ref Range Status   MRSA, PCR POSITIVE (Alphonse Asbridge)  NEGATIVE Final    Comment: RESULT CALLED TO, READ BACK BY AND VERIFIED WITH: Laural Roes RN 06/27/18 0015 JDW    Staphylococcus aureus POSITIVE (Dontravious Camille) NEGATIVE  Final    Comment: (NOTE) The Xpert SA Assay (FDA approved for NASAL specimens in patients 42 years of age and older), is one component of Shayde Gervacio comprehensive surveillance program. It is not intended to diagnose infection nor to guide or monitor treatment. Performed at [facility], [place] [address], [place], [place] [address]      Labs: Basic Metabolic Panel: Recent Labs  Lab 06/26/18 1506 06/27/18 1134 06/28/18 0206 07/01/18 0415 07/02/18 0436 07/03/18 0215  NA 136 135 132* 133* 135 139  K 3.9 3.7 3.7 3.5 3.6 3.2*  CL 99  --  102 105 106 107  CO2 24  --  17* 22 21* 24  GLUCOSE 135* 133* 166* 134* 117* 110*  BUN 7*  --  10 14 15 13   CREATININE 0.96  --  1.00 0.78 0.78 0.71  CALCIUM 9.3  --  7.9* 7.8* 8.0* 8.1*  MG  --   --   --   --   --  1.7   Liver Function Tests: No results for input(s): AST, ALT, ALKPHOS, BILITOT, PROT, ALBUMIN in the last 168 hours. No results for input(s): LIPASE, AMYLASE in the last 168 hours. No results for input(s): AMMONIA in the last 168 hours. CBC: Recent Labs  Lab 06/26/18 1506  06/29/18 0222 06/30/18 0341 07/01/18 0848 07/02/18 0436 07/03/18 0215  WBC 23.3*   < > 15.1* 15.2* 15.1* 14.0* 15.0*  NEUTROABS 18.7*  --   --   --   --   --   --   HGB 15.2*   < > 7.3* 7.2* 6.7* 8.9* 8.3*  HCT 46.1*   < > 22.2* 22.0* 20.2* 26.4* 24.8*  MCV 90.7   < > 88.4 89.1 87.8 84.6 85.5  PLT 398   < > 181 197 268 277 353   < > = values in this interval not displayed.   Cardiac Enzymes: No results for input(s): CKTOTAL, CKMB, CKMBINDEX, TROPONINI in the last 168 hours. BNP: BNP (last 3 results) No results for input(s): BNP in the last 8760 hours.  ProBNP (last 3 results) No results for input(s): PROBNP in the last 8760 hours.  CBG: No results for input(s): GLUCAP in the last 168  hours.     Signed:  Fayrene Helper MD.  [facility] 07/03/2018, 1:01 PM

## 2018-07-03 NOTE — TOC Transition Note (Addendum)
Transition of Care [facility]) - CM/SW Discharge Note   Patient Details  Name: [name] MRN: [id number] Date of Birth: 17-Mar-1946  Transition of Care [facility]) CM/SW Contact:  Alexander Mt, [place] Phone Number: 07/03/2018, 12:23 PM   Clinical Narrative:    Pt medically cleared for discharge, plan for transfer to [facility]. Called and updated pt daughter, await d/c summary. Spoke with Devonna through [employer] and their liaison will follow pt to SNF as well and reinstate services when appropriate.  Final next level of care: Skilled Nursing Facility Barriers to Discharge: Barriers Resolved   Patient Goals and CMS Choice Patient states their goals for this hospitalization and ongoing recovery are:: for her to be taken care of CMS Medicare.gov Compare Post Acute Care list provided to:: Patient Represenative (must comment) Choice offered to / list presented to : Adult Children, HC POA / Guardian  Discharge Placement PASRR number recieved: (pending, clinicals submitted on 4/9)            Patient chooses bed at: [facility] Patient to be transferred to facility by: [place] Name of family member notified: pt daughter [name] Patient and family notified of of transfer: 07/03/18  Discharge Plan and Services In-house Referral: Hospice / [place] Acute Care Choice: [place] Agency: NA   Social Determinants of Health ([place]) Interventions     Readmission Risk Interventions Readmission Risk Prevention Plan 06/27/2018  Transportation Screening Complete  PCP or Specialist Appt within 3-5 Days Complete  HRI or [place] Complete  Social Work Consult for [place] Planning/Counseling Complete  Palliative Care Screening Complete  Medication Review Press photographer) Complete  Some recent data might be hidden

## 2018-07-03 NOTE — Progress Notes (Signed)
Report given to Jeannetta Nap, Science writer at [employer].

## 2018-07-07 DIAGNOSIS — F0150 Vascular dementia without behavioral disturbance: Secondary | ICD-10-CM | POA: Diagnosis not present

## 2018-07-07 DIAGNOSIS — F05 Delirium due to known physiological condition: Secondary | ICD-10-CM | POA: Diagnosis not present

## 2018-07-07 DIAGNOSIS — M25551 Pain in right hip: Secondary | ICD-10-CM | POA: Diagnosis not present

## 2018-07-07 DIAGNOSIS — S72041D Displaced fracture of base of neck of right femur, subsequent encounter for closed fracture with routine healing: Secondary | ICD-10-CM | POA: Diagnosis not present

## 2018-07-07 DIAGNOSIS — E46 Unspecified protein-calorie malnutrition: Secondary | ICD-10-CM | POA: Diagnosis not present

## 2018-07-08 ENCOUNTER — Other Ambulatory Visit: Payer: Self-pay

## 2018-07-08 DIAGNOSIS — R1312 Dysphagia, oropharyngeal phase: Secondary | ICD-10-CM | POA: Diagnosis not present

## 2018-07-08 DIAGNOSIS — R0989 Other specified symptoms and signs involving the circulatory and respiratory systems: Secondary | ICD-10-CM | POA: Diagnosis not present

## 2018-07-08 DIAGNOSIS — F0151 Vascular dementia with behavioral disturbance: Secondary | ICD-10-CM | POA: Diagnosis not present

## 2018-07-08 DIAGNOSIS — S72001D Fracture of unspecified part of neck of right femur, subsequent encounter for closed fracture with routine healing: Secondary | ICD-10-CM | POA: Diagnosis not present

## 2018-07-08 DIAGNOSIS — I10 Essential (primary) hypertension: Secondary | ICD-10-CM | POA: Diagnosis not present

## 2018-07-08 NOTE — Patient Outreach (Signed)
[facility]) Care Management  07/08/2018  [name] [birth year] [id number]  Transition of care  Referral date: 07/08/18 Referral source: discharged from [facility] [place]  On 07/03/18 Insurance:  [employer] Medicare  ATTEMPT #1   Telephone call to patient regarding transition of care referral. Unable to reach patient. HIPAA compliant voice message left with call back phone number.   PLAN: RNCM will attempt 2nd telephone call to patient within 4 business days. RNCM will send outreach letter.   Quinn Plowman RN,BSN, [place] Telephonic  [phone]

## 2018-07-09 ENCOUNTER — Emergency Department (HOSPITAL_COMMUNITY): Payer: Medicare Other

## 2018-07-09 ENCOUNTER — Inpatient Hospital Stay (HOSPITAL_COMMUNITY)
Admission: EM | Admit: 2018-07-09 | Discharge: 2018-07-11 | DRG: 811 | Disposition: A | Discharge status: Skilled Nursing Facility | Payer: Medicare Other | Source: Skilled Nursing Facility | Attending: Internal Medicine | Admitting: Internal Medicine

## 2018-07-09 ENCOUNTER — Other Ambulatory Visit: Payer: Self-pay

## 2018-07-09 ENCOUNTER — Encounter (HOSPITAL_COMMUNITY): Payer: Self-pay | Admitting: Emergency Medicine

## 2018-07-09 DIAGNOSIS — E859 Amyloidosis, unspecified: Secondary | ICD-10-CM | POA: Diagnosis not present

## 2018-07-09 DIAGNOSIS — Z7902 Long term (current) use of antithrombotics/antiplatelets: Secondary | ICD-10-CM | POA: Diagnosis not present

## 2018-07-09 DIAGNOSIS — I2693 Single subsegmental pulmonary embolism without acute cor pulmonale: Secondary | ICD-10-CM | POA: Diagnosis present

## 2018-07-09 DIAGNOSIS — R195 Other fecal abnormalities: Secondary | ICD-10-CM | POA: Diagnosis present

## 2018-07-09 DIAGNOSIS — J381 Polyp of vocal cord and larynx: Secondary | ICD-10-CM | POA: Diagnosis present

## 2018-07-09 DIAGNOSIS — W19XXXA Unspecified fall, initial encounter: Secondary | ICD-10-CM | POA: Diagnosis not present

## 2018-07-09 DIAGNOSIS — R000 Tachycardia, unspecified: Secondary | ICD-10-CM | POA: Diagnosis present

## 2018-07-09 DIAGNOSIS — K219 Gastro-esophageal reflux disease without esophagitis: Secondary | ICD-10-CM | POA: Diagnosis present

## 2018-07-09 DIAGNOSIS — I82409 Acute embolism and thrombosis of unspecified deep veins of unspecified lower extremity: Secondary | ICD-10-CM | POA: Diagnosis not present

## 2018-07-09 DIAGNOSIS — R0989 Other specified symptoms and signs involving the circulatory and respiratory systems: Secondary | ICD-10-CM | POA: Diagnosis not present

## 2018-07-09 DIAGNOSIS — Z79899 Other long term (current) drug therapy: Secondary | ICD-10-CM

## 2018-07-09 DIAGNOSIS — D72829 Elevated white blood cell count, unspecified: Secondary | ICD-10-CM | POA: Diagnosis not present

## 2018-07-09 DIAGNOSIS — J449 Chronic obstructive pulmonary disease, unspecified: Secondary | ICD-10-CM | POA: Diagnosis not present

## 2018-07-09 DIAGNOSIS — R0902 Hypoxemia: Secondary | ICD-10-CM | POA: Diagnosis present

## 2018-07-09 DIAGNOSIS — I2699 Other pulmonary embolism without acute cor pulmonale: Secondary | ICD-10-CM

## 2018-07-09 DIAGNOSIS — R05 Cough: Secondary | ICD-10-CM | POA: Diagnosis not present

## 2018-07-09 DIAGNOSIS — M1990 Unspecified osteoarthritis, unspecified site: Secondary | ICD-10-CM | POA: Diagnosis present

## 2018-07-09 DIAGNOSIS — K5900 Constipation, unspecified: Secondary | ICD-10-CM | POA: Diagnosis present

## 2018-07-09 DIAGNOSIS — F329 Major depressive disorder, single episode, unspecified: Secondary | ICD-10-CM | POA: Diagnosis present

## 2018-07-09 DIAGNOSIS — I10 Essential (primary) hypertension: Secondary | ICD-10-CM | POA: Diagnosis present

## 2018-07-09 DIAGNOSIS — S72009A Fracture of unspecified part of neck of unspecified femur, initial encounter for closed fracture: Secondary | ICD-10-CM | POA: Diagnosis not present

## 2018-07-09 DIAGNOSIS — S2241XA Multiple fractures of ribs, right side, initial encounter for closed fracture: Secondary | ICD-10-CM | POA: Diagnosis present

## 2018-07-09 DIAGNOSIS — Z515 Encounter for palliative care: Secondary | ICD-10-CM | POA: Diagnosis not present

## 2018-07-09 DIAGNOSIS — D473 Essential (hemorrhagic) thrombocythemia: Secondary | ICD-10-CM | POA: Diagnosis present

## 2018-07-09 DIAGNOSIS — Z96641 Presence of right artificial hip joint: Secondary | ICD-10-CM | POA: Diagnosis present

## 2018-07-09 DIAGNOSIS — Z8679 Personal history of other diseases of the circulatory system: Secondary | ICD-10-CM

## 2018-07-09 DIAGNOSIS — Z803 Family history of malignant neoplasm of breast: Secondary | ICD-10-CM | POA: Diagnosis not present

## 2018-07-09 DIAGNOSIS — Z66 Do not resuscitate: Secondary | ICD-10-CM | POA: Diagnosis present

## 2018-07-09 DIAGNOSIS — Z888 Allergy status to other drugs, medicaments and biological substances status: Secondary | ICD-10-CM

## 2018-07-09 DIAGNOSIS — Z7982 Long term (current) use of aspirin: Secondary | ICD-10-CM | POA: Diagnosis not present

## 2018-07-09 DIAGNOSIS — S72041D Displaced fracture of base of neck of right femur, subsequent encounter for closed fracture with routine healing: Secondary | ICD-10-CM | POA: Diagnosis not present

## 2018-07-09 DIAGNOSIS — Z87891 Personal history of nicotine dependence: Secondary | ICD-10-CM

## 2018-07-09 DIAGNOSIS — Z881 Allergy status to other antibiotic agents status: Secondary | ICD-10-CM

## 2018-07-09 DIAGNOSIS — D649 Anemia, unspecified: Secondary | ICD-10-CM | POA: Diagnosis present

## 2018-07-09 DIAGNOSIS — D62 Acute posthemorrhagic anemia: Principal | ICD-10-CM | POA: Diagnosis present

## 2018-07-09 DIAGNOSIS — F0150 Vascular dementia without behavioral disturbance: Secondary | ICD-10-CM | POA: Diagnosis present

## 2018-07-09 DIAGNOSIS — R079 Chest pain, unspecified: Secondary | ICD-10-CM | POA: Diagnosis not present

## 2018-07-09 DIAGNOSIS — Z8673 Personal history of transient ischemic attack (TIA), and cerebral infarction without residual deficits: Secondary | ICD-10-CM | POA: Diagnosis not present

## 2018-07-09 DIAGNOSIS — Z882 Allergy status to sulfonamides status: Secondary | ICD-10-CM

## 2018-07-09 DIAGNOSIS — R0602 Shortness of breath: Secondary | ICD-10-CM | POA: Diagnosis not present

## 2018-07-09 DIAGNOSIS — F0390 Unspecified dementia without behavioral disturbance: Secondary | ICD-10-CM | POA: Diagnosis not present

## 2018-07-09 DIAGNOSIS — Z8781 Personal history of (healed) traumatic fracture: Secondary | ICD-10-CM

## 2018-07-09 LAB — COMPREHENSIVE METABOLIC PANEL
ALT: 23 U/L (ref 0–44)
AST: 32 U/L (ref 15–41)
Albumin: 2.6 g/dL — ABNORMAL LOW (ref 3.5–5.0)
Alkaline Phosphatase: 122 U/L (ref 38–126)
Anion gap: 12 (ref 5–15)
BUN: 25 mg/dL — ABNORMAL HIGH (ref 8–23)
CO2: 21 mmol/L — ABNORMAL LOW (ref 22–32)
Calcium: 8.4 mg/dL — ABNORMAL LOW (ref 8.9–10.3)
Chloride: 109 mmol/L (ref 98–111)
Creatinine, Ser: 0.95 mg/dL (ref 0.44–1.00)
GFR calc Af Amer: >60 mL/min (ref >60)
GFR calc non Af Amer: 59 mL/min — ABNORMAL LOW (ref >60)
Glucose, Bld: 113 mg/dL — ABNORMAL HIGH (ref 70–99)
Potassium: 3.6 mmol/L (ref 3.5–5.1)
Sodium: 142 mmol/L (ref 135–145)
Total Bilirubin: 0.9 mg/dL (ref 0.3–1.2)
Total Protein: 5.7 g/dL — ABNORMAL LOW (ref 6.5–8.1)

## 2018-07-09 LAB — PREPARE RBC (CROSSMATCH)

## 2018-07-09 LAB — CBC WITH DIFFERENTIAL/PLATELET
Abs Immature Granulocytes: 0.5 10*3/uL — ABNORMAL HIGH (ref 0.00–0.07)
Basophils Absolute: 0 10*3/uL (ref 0.0–0.1)
Basophils Relative: 0 %
Eosinophils Absolute: 0.3 10*3/uL (ref 0.0–0.5)
Eosinophils Relative: 1 %
HCT: 20.6 % — ABNORMAL LOW (ref 36.0–46.0)
Hemoglobin: 5.9 g/dL — CL (ref 12.0–15.0)
Lymphocytes Relative: 3 %
Lymphs Abs: 0.8 10*3/uL (ref 0.7–4.0)
MCH: 29.4 pg (ref 26.0–34.0)
MCHC: 28.6 g/dL — ABNORMAL LOW (ref 30.0–36.0)
MCV: 102.5 fL — ABNORMAL HIGH (ref 80.0–100.0)
Monocytes Absolute: 0.5 10*3/uL (ref 0.1–1.0)
Monocytes Relative: 2 %
Myelocytes: 2 %
Neutro Abs: 23 10*3/uL — ABNORMAL HIGH (ref 1.7–7.7)
Neutrophils Relative %: 92 %
Platelets: 720 10*3/uL — ABNORMAL HIGH (ref 150–400)
RBC: 2.01 MIL/uL — ABNORMAL LOW (ref 3.87–5.11)
RDW: 19.7 % — ABNORMAL HIGH (ref 11.5–15.5)
WBC: 25 10*3/uL — ABNORMAL HIGH (ref 4.0–10.5)
nRBC: 0 /100 WBC
nRBC: 0.4 % — ABNORMAL HIGH (ref 0.0–0.2)

## 2018-07-09 LAB — MRSA PCR SCREENING: MRSA by PCR: NEGATIVE

## 2018-07-09 LAB — D-DIMER, QUANTITATIVE: D-Dimer, Quant: 9.49 ug/mL-FEU — ABNORMAL HIGH (ref 0.00–0.50)

## 2018-07-09 LAB — BRAIN NATRIURETIC PEPTIDE: B Natriuretic Peptide: 252.3 pg/mL — ABNORMAL HIGH (ref 0.0–100.0)

## 2018-07-09 LAB — LACTIC ACID, PLASMA
Lactic Acid, Venous: 2.4 mmol/L (ref 0.5–1.9)
Lactic Acid, Venous: 1.2 mmol/L (ref 0.5–1.9)

## 2018-07-09 LAB — HEMOGLOBIN AND HEMATOCRIT, BLOOD
HCT: 23.7 % — ABNORMAL LOW (ref 36.0–46.0)
Hemoglobin: 7.6 g/dL — ABNORMAL LOW (ref 12.0–15.0)

## 2018-07-09 LAB — TROPONIN I: Troponin I: 0.03 ng/mL (ref <0.03)

## 2018-07-09 LAB — POC OCCULT BLOOD, ED: Fecal Occult Bld: POSITIVE — AB

## 2018-07-09 MED ORDER — B COMPLEX PO TABS: 1.0000 | ORAL_TABLET | Freq: Every day | ORAL | Status: DC

## 2018-07-09 MED ORDER — VANCOMYCIN HCL 500 MG IV SOLR: 500.0000 mg | INTRAVENOUS | Status: DC

## 2018-07-09 MED ORDER — SODIUM CHLORIDE 0.9% IV SOLUTION: Freq: Once | INTRAVENOUS | Status: DC

## 2018-07-09 MED ORDER — VITAMIN C 500 MG PO TABS
500.0000 mg | ORAL_TABLET | Freq: Every day | ORAL | Status: DC
  Administered 2018-07-10 – 2018-07-11 (×2): 500 mg via ORAL
  Filled 2018-07-09 (×2): qty 1

## 2018-07-09 MED ORDER — GABAPENTIN 300 MG PO CAPS
300.0000 mg | ORAL_CAPSULE | Freq: Every day | ORAL | Status: DC
  Filled 2018-07-09: qty 1

## 2018-07-09 MED ORDER — MIRTAZAPINE 7.5 MG PO TABS
30.0000 mg | ORAL_TABLET | Freq: Every day | ORAL | Status: DC
  Filled 2018-07-09: qty 4

## 2018-07-09 MED ORDER — LOSARTAN POTASSIUM 50 MG PO TABS
100.0000 mg | ORAL_TABLET | Freq: Every day | ORAL | Status: DC
  Administered 2018-07-10 – 2018-07-11 (×2): 100 mg via ORAL
  Filled 2018-07-09 (×2): qty 2

## 2018-07-09 MED ORDER — VANCOMYCIN HCL IN DEXTROSE 1-5 GM/200ML-% IV SOLN
1000.0000 mg | Freq: Once | INTRAVENOUS | Status: AC
  Administered 2018-07-09: 1000 mg via INTRAVENOUS
  Filled 2018-07-09: qty 200

## 2018-07-09 MED ORDER — ATORVASTATIN CALCIUM 10 MG PO TABS
20.0000 mg | ORAL_TABLET | Freq: Every day | ORAL | Status: DC
  Administered 2018-07-10: 20 mg via ORAL
  Filled 2018-07-09: qty 2

## 2018-07-09 MED ORDER — OLANZAPINE 2.5 MG PO TABS
7.5000 mg | ORAL_TABLET | Freq: Two times a day (BID) | ORAL | Status: DC
  Administered 2018-07-10 – 2018-07-11 (×2): 7.5 mg via ORAL
  Filled 2018-07-09: qty 1
  Filled 2018-07-09 (×4): qty 3

## 2018-07-09 MED ORDER — IOHEXOL 350 MG/ML SOLN
75.0000 mL | Freq: Once | INTRAVENOUS | Status: AC | PRN
  Administered 2018-07-09: 75 mL via INTRAVENOUS

## 2018-07-09 MED ORDER — SODIUM CHLORIDE 0.9 % IV BOLUS
500.0000 mL | Freq: Once | INTRAVENOUS | Status: AC
  Administered 2018-07-09: 500 mL via INTRAVENOUS

## 2018-07-09 MED ORDER — SODIUM CHLORIDE 0.9 % IV SOLN
2.0000 g | Freq: Two times a day (BID) | INTRAVENOUS | Status: DC
  Administered 2018-07-09: 2 g via INTRAVENOUS
  Filled 2018-07-09 (×3): qty 2

## 2018-07-09 MED ORDER — IPRATROPIUM-ALBUTEROL 0.5-2.5 (3) MG/3ML IN SOLN: 3.0000 mL | Freq: Four times a day (QID) | RESPIRATORY_TRACT | Status: DC | PRN

## 2018-07-09 MED ORDER — TAMSULOSIN HCL 0.4 MG PO CAPS
0.4000 mg | ORAL_CAPSULE | Freq: Every day | ORAL | Status: DC
  Administered 2018-07-10: 0.4 mg via ORAL
  Filled 2018-07-09: qty 1

## 2018-07-09 NOTE — ED Notes (Signed)
ED Provider at bedside. 

## 2018-07-09 NOTE — Progress Notes (Addendum)
    Discussed the patient w/ dr. Ellender Hose of ED Medicine.  She has decreased Hgb and heme + stool and pulmonary emboli. No signs of GI hemorrhage at this time.  Chart review indicates she has amyloidosis and is on hospice though that is not listed in [place] I think it is accurate.  Anemia is chronic. Iron and B12 ok   It does not sound like she is having a GI hemorrhage.  Difficult situation - we will see her tomorrow but sounds like anti-coagulating and transfusing and observing makes most sense along with addressing code status., If she is in hospice she is DNR?  Address goals of care  Gatha Mayer, MD, [facility] Gastroenterology 07/09/2018 7:46 PM Pager [phone]

## 2018-07-09 NOTE — ED Provider Notes (Signed)
[place] EMERGENCY DEPARTMENT Provider Note   CSN: [id number] Arrival date & time: 07/09/18  1513    History   Chief Complaint Chief Complaint  Patient presents with  . Shortness of Breath    HPI [name] is a 73 y.o. female.     HPI   73 yo F with PMHx COPD, HTN, vascular dementia here with SOB. History limited 2/2 vascular dementia. Per review of records, pt just underwent hip repair last week, and also sustained multiple rib fx at that time. She was seen, operated on, and discharged to SNF for recovery until she could resume palliative care. Per report from EMS, pt began c/o SOB today and was noted to be hypoxic to the 80s. She was sent here. Improved w/ nasal cannula. Denies any complaints on my assessment though history limited 2/2 dementia. No known fevers.  Level 5 caveat invoked as remainder of history, ROS, and physical exam limited due to patient's dementia.   Past Medical History:  Diagnosis Date  . Carotid artery aneurysm ([place]) 03/04/2012  . COPD (chronic obstructive pulmonary disease) ([place])   . Depression   . DJD (degenerative joint disease)   . GERD (gastroesophageal reflux disease)   . Hypertension   . Stroke [facility])    was on Plavix - not now  . Vascular dementia [facility])     Patient Active Problem List   Diagnosis Date Noted  . Symptomatic anemia 07/09/2018  . Acute blood loss as cause of postoperative anemia 06/28/2018  . Displaced fracture of right femoral neck ([place]) 06/27/2018  . Multiple fractures of ribs, right side, initial encounter for closed fracture 06/26/2018  . Fracture of femoral neck, right, closed ([place]) 06/26/2018  . Hematoma of right parietal scalp 06/26/2018  . Leukocytosis 06/26/2018  . Inadequate pain control 06/26/2018  . Hip fracture ([place]) 06/26/2018  . AKI (acute kidney injury) ([place]) 04/04/2018  . Hypoxia 04/04/2018  . Probable sepsis 04/04/2018  . Vascular dementia without behavioral disturbance ([place])  06/30/2016  . Disease ruled out after examination   . Aphasia 05/15/2016  . Atherosclerosis of left carotid artery   . Cerebral embolism with cerebral infarction 02/27/2016  . Acute encephalopathy 02/26/2016  . Osteoporosis 07/04/2015  . Impaired cognition 03/30/2014  . Vitamin D deficiency 08/18/2013  . Hyperlipidemia 08/18/2013  . Prediabetes 08/18/2013  . Hospice care patient 08/18/2013  . CKD (chronic kidney disease) stage 3, GFR 30-59 ml/min ([name]) 07/01/2013  . Anemia of chronic disease 07/01/2013  . Depression, major, in remission ([place]) 06/30/2013  . Essential hypertension 02/09/2013  . COPD GOLD III 03/28/2012  . Vocal cord dysfunction 03/28/2012  . Carotid artery aneurysm ([place]) 03/04/2012  . Smoker 03/04/2012    Past Surgical History:  Procedure Laterality Date  . CATARACT EXTRACTION Bilateral   . COLONOSCOPY  2004  . HERNIA REPAIR Right 2014   inguinal  . KYPHOPLASTY N/A 10/28/2014   Procedure: KYPHOPLASTY;  Surgeon: Phylliss Bob, MD;  Location: [place];  Service: Orthopedics;  Laterality: N/A;  Lumbar 1 kyphoplasty  . MICROLARYNGOSCOPY WITH CO2 LASER AND EXCISION OF VOCAL CORD LESION N/A 06/05/2013   Procedure: MICROLARYNGOSCOPY WITH CO2 LASER AND EXCISION OF VOCAL CORD POLYPS;  Surgeon: Melida Quitter, MD;  Location: [place];  Service: ENT;  Laterality: N/A;  . TONSILLECTOMY    . TOTAL HIP ARTHROPLASTY Right 06/27/2018   Procedure: TOTAL HIP ARTHROPLASTY ANTERIOR APPROACH;  Surgeon: Rod Can, MD;  Location: [place];  Service: Orthopedics;  Laterality: Right;  .  TUBAL LIGATION  30 years  . VIDEO BRONCHOSCOPY  04/24/2012   Procedure: VIDEO BRONCHOSCOPY WITHOUT FLUORO;  Surgeon: Rigoberto Noel, MD;  Location: [place];  Service: Cardiopulmonary;  Laterality: Bilateral;     OB History   No obstetric history on file.      Home Medications    Prior to Admission medications   Medication Sig Start Date End Date Taking? Authorizing Provider  acetaminophen (TYLENOL)  500 MG tablet Take 500 mg by mouth every 4 (four) hours as needed for mild pain.    [provider]  alum & mag hydroxide-simeth ([place]) 200-200-20 MG/5ML suspension Take 30 mLs by mouth every 6 (six) hours as needed for indigestion or heartburn.    [provider]  aspirin EC 81 MG tablet Take 1 tablet (81 mg total) by mouth 2 (two) times daily. 07/03/18 07/03/19  Elodia Florence., MD  atorvastatin (LIPITOR) 20 MG tablet Take 1 tablet (20 mg total) by mouth daily at 6 PM. 02/29/16   Reyne Dumas, MD  b complex vitamins tablet Take 1 tablet by mouth daily. 01/25/16   Rolene Course, PA-C  clopidogrel (PLAVIX) 75 MG tablet Take 75 mg by mouth daily.    [provider]  gabapentin (NEURONTIN) 300 MG capsule Take 300 mg by mouth at bedtime. 06/12/16   [provider]  guaiFENesin (ROBITUSSIN) 100 MG/5ML SOLN Take 10 mLs by mouth every 6 (six) hours as needed for cough or to loosen phlegm.    [provider]  hyoscyamine (LEVSIN, ANASPAZ) 0.125 MG tablet Take 0.125 mg by mouth See admin instructions. Take 1 tablet by mouth every 2 hours as needed excessive secretions    [provider]  ipratropium-albuterol (DUONEB) 0.5-2.5 (3) MG/3ML SOLN Take 3 mLs by nebulization every 6 (six) hours as needed (wheezing/congestion).    [provider]  loperamide (IMODIUM) 2 MG capsule Take 2 mg by mouth as needed for diarrhea or loose stools.    [provider]  losartan (COZAAR) 100 MG tablet Take 100 mg by mouth daily. 06/13/16   [provider]  magnesium hydroxide (MILK OF MAGNESIA) 400 MG/5ML suspension Take 30 mLs by mouth at bedtime as needed for mild constipation.    [provider]  mirtazapine (REMERON) 30 MG tablet Take 30 mg by mouth at bedtime. 06/28/16   [provider]  OLANZapine (ZYPREXA) 7.5 MG tablet Take 7.5 mg by mouth 2 (two) times daily.     [provider]  polyethylene glycol (MIRALAX /  GLYCOLAX) packet Take 17 g by mouth daily. Patient taking differently: Take 17 g by mouth daily as needed for mild constipation.  04/08/18   Bonnielee Haff, MD  tamsulosin (FLOMAX) 0.4 MG CAPS capsule Take 1 capsule (0.4 mg total) by mouth daily after supper for 30 days. 07/03/18 08/02/18  Elodia Florence., MD  traZODone (DESYREL) 50 MG tablet Take 50 mg by mouth at bedtime as needed for sleep.    [provider]  trolamine salicylate (ASPERCREME) 10 % cream Apply 1 application topically 2 (two) times daily as needed for muscle pain.     [provider]  vitamin C (ASCORBIC ACID) 500 MG tablet Take 500 mg by mouth daily.    [provider]    Family History Family History  Problem Relation Age of Onset  . Breast cancer Mother   . Breast cancer Sister     Social History Social History   Tobacco Use  .  Smoking status: Former Smoker    Packs/day: 0.25    Years: 30.00    Pack years: 7.50    Types: Cigarettes  . Smokeless tobacco: Never Used  Substance Use Topics  . Alcohol use: No  . Drug use: No     Allergies   Amoxapine and related; Amoxicillin; Ciprocinonide [fluocinolone]; Ciprofloxacin; Doxycycline; Erythromycin; Sulfa antibiotics; and Zofran [ondansetron hcl]   Review of Systems Review of Systems  Unable to perform ROS: Dementia     Physical Exam Updated Vital Signs BP 139/65 (BP Location: Left Arm)   Pulse 99   Temp 98.5 F (36.9 C) (Oral)   Resp (!) 22   Ht 5\' 2"  (1.575 m) Comment: per chart: pt unable to tell me  Wt 46.6 kg   SpO2 97%   BMI 18.79 kg/m   Physical Exam Vitals signs and nursing note reviewed.  Constitutional:      General: She is not in acute distress.    Appearance: She is well-developed.     Comments: Chronically ill appearing, appears mildly uncomfortable  HENT:     Head: Normocephalic and atraumatic.  Eyes:     Conjunctiva/sclera: Conjunctivae normal.  Neck:     Musculoskeletal: Neck supple.   Cardiovascular:     Rate and Rhythm: Regular rhythm. Tachycardia present.     Heart sounds: Normal heart sounds. No murmur. No friction rub.  Pulmonary:     Effort: Pulmonary effort is normal. Tachypnea present. No respiratory distress.     Breath sounds: Examination of the right-lower field reveals rales. Rales present. No wheezing.  Abdominal:     General: There is no distension.     Palpations: Abdomen is soft.     Tenderness: There is no abdominal tenderness.  Musculoskeletal:     Right lower leg: No edema.     Comments: Right hip op site c/d/i, dressing in place; no unilateral leg swelling  Skin:    General: Skin is warm.     Capillary Refill: Capillary refill takes less than 2 seconds.  Neurological:     Mental Status: She is alert and oriented to person, place, and time.     Motor: No abnormal muscle tone.      ED Treatments / Results  Labs (all labs ordered are listed, but only abnormal results are displayed) Labs Reviewed  CBC WITH DIFFERENTIAL/PLATELET - Abnormal; Notable for the following components:      Result Value   WBC 25.0 (*)    RBC 2.01 (*)    Hemoglobin 5.9 (*)    HCT 20.6 (*)    MCV 102.5 (*)    MCHC 28.6 (*)    RDW 19.7 (*)    Platelets 720 (*)    nRBC 0.4 (*)    Neutro Abs 23.0 (*)    Abs Immature Granulocytes 0.50 (*)    All other components within normal limits  COMPREHENSIVE METABOLIC PANEL - Abnormal; Notable for the following components:   CO2 21 (*)    Glucose, Bld 113 (*)    BUN 25 (*)    Calcium 8.4 (*)    Total Protein 5.7 (*)    Albumin 2.6 (*)    GFR calc non Af Amer 59 (*)    All other components within normal limits  BRAIN NATRIURETIC PEPTIDE - Abnormal; Notable for the following components:   B Natriuretic Peptide 252.3 (*)    All other components within normal limits  TROPONIN I - Abnormal; Notable for the following  components:   Troponin I 0.03 (*)    All other components within normal limits  D-DIMER, QUANTITATIVE (NOT  AT [facility]) - Abnormal; Notable for the following components:   D-Dimer, Quant 9.49 (*)    All other components within normal limits  LACTIC ACID, PLASMA - Abnormal; Notable for the following components:   Lactic Acid, Venous 2.4 (*)    All other components within normal limits  HEMOGLOBIN AND HEMATOCRIT, BLOOD - Abnormal; Notable for the following components:   Hemoglobin 7.6 (*)    HCT 23.7 (*)    All other components within normal limits  POC OCCULT BLOOD, ED - Abnormal; Notable for the following components:   Fecal Occult Bld POSITIVE (*)    All other components within normal limits  MRSA PCR SCREENING  CULTURE, BLOOD (ROUTINE X 2)  CULTURE, BLOOD (ROUTINE X 2)  LACTIC ACID, PLASMA  HEMATOCRIT  HEMOGLOBIN  BASIC METABOLIC PANEL  CBC  HEMATOCRIT  HEMOGLOBIN  TYPE AND SCREEN  PREPARE RBC (CROSSMATCH)    EKG EKG Interpretation  Date/Time:  Wednesday July 09 2018 15:21:56 EDT Ventricular Rate:  106 PR Interval:    QRS Duration: 97 QT Interval:  365 QTC Calculation: 485 R Axis:   67 Text Interpretation:  Normal sinus rhythm Low voltage, extremity and precordial leads No significant change since last tracing Confirmed by Duffy Bruce [phone]) on 07/09/2018 3:32:25 PM   Radiology Ct Angio Chest Pe W And/or Wo Contrast  Result Date: 07/09/2018 CLINICAL DATA:  73 year old female with shortness of breath EXAM: CT ANGIOGRAPHY CHEST WITH CONTRAST TECHNIQUE: Multidetector CT imaging of the chest was performed using the standard protocol during bolus administration of intravenous contrast. Multiplanar CT image reconstructions and MIPs were obtained to evaluate the vascular anatomy. CONTRAST:  51mL OMNIPAQUE IOHEXOL 350 MG/ML SOLN COMPARISON:  04/04/2018, 03/17/2012 FINDINGS: Cardiovascular: Heart: Borderline cardiomegaly. No pericardial fluid/thickening. Calcifications of left anterior descending, circumflex, right coronary artery. Aorta: Unremarkable course, caliber, contour of the  thoracic aorta. No aneurysm or dissection flap. No periaortic fluid. Pulmonary arteries: There are a few central filling defects within the subsegmental vessels of right middle lobe, right lower lobe, left lower lobe. No associated lung changes/infarction. Mediastinum/Nodes: No mediastinal adenopathy. Unremarkable appearance of the thoracic esophagus. Unremarkable appearance of the thoracic inlet and thyroid. Lungs/Pleura: Advanced paraseptal and centrilobular emphysema. Platelike scarring at the bilateral lung apices, relatively unchanged dating to 03/17/2012. No confluent airspace disease, pneumothorax, or pleural effusion. No evidence of edema. No endotracheal or endobronchial debris or bronchial wall thickening. Upper Abdomen: No acute. Musculoskeletal: No acute displaced fracture. Partially healed fracture of the lower sternum. No bony canal narrowing. Changes of vertebral augmentation of L1. Review of the MIP images confirms the above findings. IMPRESSION: CT is positive for a few bilateral subsegmental pulmonary emboli, with no evidence of lung infarction. These results were discussed by telephone at the time of interpretation on 07/09/2018 at 5:49 pm with Dr. Duffy Bruce. Advanced paraseptal and centrilobular emphysema. Emphysema (ICD10-J43.9). Aortic Atherosclerosis (ICD10-I70.0). Associated coronary artery disease. Electronically Signed   By: Corrie Mckusick D.O.   On: 07/09/2018 17:52   Dg Chest Portable 1 View  Result Date: 07/09/2018 CLINICAL DATA:  Shortness of breath EXAM: PORTABLE CHEST 1 VIEW COMPARISON:  07/02/2018 FINDINGS: There is no focal consolidation. There is biapical scarring. There is no pneumothorax. There is a small right pleural effusion. The heart and mediastinal contours are unremarkable. There are multiple right posterolateral rib fractures involving the second, third, fourth, fifth, 6,  seventh and eighth ribs. IMPRESSION: Trace right pleural effusion. Multiple right rib  fractures again noted. Electronically Signed   By: Kathreen Devoid   On: 07/09/2018 15:52    Procedures .Critical Care Performed by: Duffy Bruce, MD Authorized by: Duffy Bruce, MD   Critical care provider statement:    Critical care time (minutes):  75   Critical care time was exclusive of:  Separately billable procedures and treating other patients and teaching time   Critical care was necessary to treat or prevent imminent or life-threatening deterioration of the following conditions:  Cardiac failure, circulatory failure and respiratory failure   Critical care was time spent personally by me on the following activities:  Development of treatment plan with patient or surrogate, discussions with consultants, evaluation of patient's response to treatment, examination of patient, obtaining history from patient or surrogate, ordering and performing treatments and interventions, ordering and review of laboratory studies, ordering and review of radiographic studies, pulse oximetry, re-evaluation of patient's condition and review of old charts   I assumed direction of critical care for this patient from another provider in my specialty: no     (including critical care time)  Medications Ordered in ED Medications  0.9 %  sodium chloride infusion (Manually program via Guardrails IV Fluids) (has no administration in time range)  atorvastatin (LIPITOR) tablet 20 mg (has no administration in time range)  losartan (COZAAR) tablet 100 mg (has no administration in time range)  mirtazapine (REMERON) tablet 30 mg (30 mg Oral Not Given 07/09/18 2332)  OLANZapine (ZYPREXA) tablet 7.5 mg (7.5 mg Oral Not Given 07/09/18 2332)  tamsulosin (FLOMAX) capsule 0.4 mg (has no administration in time range)  gabapentin (NEURONTIN) capsule 300 mg (300 mg Oral Not Given 07/09/18 2331)  vitamin C (ASCORBIC ACID) tablet 500 mg (has no administration in time range)  ipratropium-albuterol (DUONEB) 0.5-2.5 (3) MG/3ML  nebulizer solution 3 mL (has no administration in time range)  vancomycin (VANCOCIN) IVPB 1000 mg/200 mL premix (0 mg Intravenous Stopped 07/09/18 1820)  sodium chloride 0.9 % bolus 500 mL (0 mLs Intravenous Stopped 07/09/18 2041)  iohexol (OMNIPAQUE) 350 MG/ML injection 75 mL (75 mLs Intravenous Contrast Given 07/09/18 1732)     Initial Impression / Assessment and Plan / ED Course  I have reviewed the triage vital signs and the nursing notes.  Pertinent labs & imaging results that were available during my care of the patient were reviewed by me and considered in my medical decision making (see chart for details).  Clinical Course as of Jul 09 9  Wed Jul 09, 2018  1602 73 yo F here with SOB, hypoxia s/p recent admission for fall w/ rib fx and hip fx. Concern for PE, PNA, hemothorax. Initial CBC also with acute on chronic anemia - received transfusions during recent hospitalization. Will cover empirically given marked leukocytosis, plan to admit.   [CI]  1615 D/w daughter who agrees w/ transfusion, ABX, fluids, but o/w pt is DNR.   [CI]  [id number] Lab work shows elevated troponin, BNP, significant leukocytosis and acute on chronic likely blood loss anemia as above.  Imaging reviewed and does show small subsegmental PEs.  Suspect this is the etiology of her acute on chronic hypoxia, in addition to shortness of breath secondary to her anemia.  She will be transfused.  Will discuss with [facility] on call to discuss anticoagulation.   [CI]  1946 Discussed with [facility], who states that anticoagulation would be okay from a surgical perspective.  I also discussed with Dr. Carlean Purl of GI.  He will leave a note in the chart.  Holding on anticoagulation for transfusions at this time.  Admit to hospitalist.   [CI]    Clinical Course User Index [CI] Duffy Bruce, MD      Final Clinical Impressions(s) / ED Diagnoses   Final diagnoses:  Acute blood loss anemia  Acute pulmonary embolism without  acute cor pulmonale, unspecified pulmonary embolism type [facility])    ED Discharge Orders    None       Duffy Bruce, MD 07/10/18 0011

## 2018-07-09 NOTE — Progress Notes (Signed)
Pharmacy Antibiotic Note  [name] is a 73 y.o. female admitted on 07/09/2018 with pneumonia.  Pharmacy has been consulted for cefepime and vancomycin dosing.  SOB>[place] 4L, AF, WBC 25.    Plan: Vancomycin 1000mg  IV x 1, then 500mg  IV every 24 hours (calc AUC 529, SCr 0.95) Cefepime 2g IV 12 hours Monitor renal function, Cx/PCR and clinical progression to narrow Vancomycin levels at steady state    Temp (24hrs), Avg:98.1 F (36.7 C), Min:98.1 F (36.7 C), Max:98.1 F (36.7 C)  Recent Labs  Lab 07/03/18 0215 07/09/18 1524  WBC 15.0* 25.0*  CREATININE 0.71  --     Estimated Creatinine Clearance: 39.7 mL/min (by C-G formula based on SCr of 0.71 mg/dL).    Allergies  Allergen Reactions  . Amoxapine And Related Nausea And Vomiting  . Amoxicillin Itching    itching  . Ciprocinonide [Fluocinolone] Other (See Comments)    Pt unsure  . Ciprofloxacin Other (See Comments)    Pt unsure  . Doxycycline Nausea And Vomiting  . Erythromycin Hives  . Sulfa Antibiotics Other (See Comments)    Unknown-reaction as a child  . Zofran [Ondansetron Hcl] Nausea And Vomiting    Antimicrobials this admission: vanc 4/15>> Cefepime 4/15>>  Dose adjustments this admission: n/a  Microbiology results: 4/15 BCx: sent  Bertis Ruddy, PharmD Clinical Pharmacist Please check AMION for all [place] numbers 07/09/2018 4:13 PM

## 2018-07-09 NOTE — ED Triage Notes (Signed)
Pt here from Nursing home with c/o sob ,sats 88 %  Pt placed on 4 liters by ems , no fevers ,

## 2018-07-09 NOTE — ED Notes (Signed)
ED TO INPATIENT HANDOFF REPORT  ED Nurse Name and Phone #:  Aaron Edelman [phone]  S Name/Age/Gender [name] 73 y.o. female Room/Bed: 033C/033C  Code Status   Code Status: Prior  Home/SNF/Other Nursing Home Patient oriented to: self and place Is this baseline? Yes   Triage Complete: Triage complete  Chief Complaint cp/low hgb  Triage Note Pt here from Nursing home with c/o sob ,sats 88 %  Pt placed on 4 liters by ems , no fevers ,    Allergies Allergies  Allergen Reactions  . Amoxapine And Related Nausea And Vomiting  . Amoxicillin Itching    itching  . Ciprocinonide [Fluocinolone] Other (See Comments)    Pt unsure  . Ciprofloxacin Other (See Comments)    Pt unsure  . Doxycycline Nausea And Vomiting  . Erythromycin Hives  . Sulfa Antibiotics Other (See Comments)    Unknown-reaction as a child  . Zofran [Ondansetron Hcl] Nausea And Vomiting    Level of Care/Admitting Diagnosis ED Disposition    ED Disposition Condition Comment   Admit  Hospital Area: [place] [[id number]]  Level of Care: Progressive [102]  Diagnosis: Symptomatic anemia [[id number]]  Admitting Physician: Shirlean Mylar  Attending Physician: Dana Allan I [[id number]]  Estimated length of stay: past midnight tomorrow  Certification:: I certify this patient will need inpatient services for at least 2 midnights  Possible Covid Disease Patient Isolation: N/A  PT Class (Do Not Modify): Inpatient [101]  PT Acc Code (Do Not Modify): Private [1]       B Medical/Surgery History Past Medical History:  Diagnosis Date  . Carotid artery aneurysm ([place]) 03/04/2012  . COPD (chronic obstructive pulmonary disease) ([place])   . Depression   . DJD (degenerative joint disease)   . GERD (gastroesophageal reflux disease)   . Hypertension   . Stroke [facility])    was on Plavix - not now  . Vascular dementia [facility])    Past Surgical History:  Procedure Laterality Date  . CATARACT EXTRACTION  Bilateral   . COLONOSCOPY  2004  . HERNIA REPAIR Right 2014   inguinal  . KYPHOPLASTY N/A 10/28/2014   Procedure: KYPHOPLASTY;  Surgeon: Phylliss Bob, MD;  Location: [place];  Service: Orthopedics;  Laterality: N/A;  Lumbar 1 kyphoplasty  . MICROLARYNGOSCOPY WITH CO2 LASER AND EXCISION OF VOCAL CORD LESION N/A 06/05/2013   Procedure: MICROLARYNGOSCOPY WITH CO2 LASER AND EXCISION OF VOCAL CORD POLYPS;  Surgeon: Melida Quitter, MD;  Location: [place];  Service: ENT;  Laterality: N/A;  . TONSILLECTOMY    . TOTAL HIP ARTHROPLASTY Right 06/27/2018   Procedure: TOTAL HIP ARTHROPLASTY ANTERIOR APPROACH;  Surgeon: Rod Can, MD;  Location: [place];  Service: Orthopedics;  Laterality: Right;  . TUBAL LIGATION  30 years  . VIDEO BRONCHOSCOPY  04/24/2012   Procedure: VIDEO BRONCHOSCOPY WITHOUT FLUORO;  Surgeon: Rigoberto Noel, MD;  Location: [place];  Service: Cardiopulmonary;  Laterality: Bilateral;     A IV Location/Drains/Wounds Patient Lines/Drains/Airways Status   Active Line/Drains/Airways    Name:   Placement date:   Placement time:   Site:   Days:   Peripheral IV 07/09/18 Right Antecubital   07/09/18    1520    Antecubital   less than 1   Peripheral IV 07/09/18 Left Wrist   07/09/18    1842    Wrist   less than 1   Incision (Closed) 06/27/18 Hip Right   06/27/18    0920  12   Wound / Incision (Open or Dehisced) 06/26/18 Laceration Head Posterior    06/26/18    2130    Head   13          Intake/Output Last 24 hours  Intake/Output Summary (Last 24 hours) at 07/09/2018 2053 Last data filed at 07/09/2018 2041 Gross per 24 hour  Intake 800 ml  Output -  Net 800 ml    Labs/Imaging Results for orders placed or performed during the hospital encounter of 07/09/18 (from the past 48 hour(s))  CBC with Differential     Status: Abnormal   Collection Time: 07/09/18  3:24 PM  Result Value Ref Range   WBC 25.0 (H) 4.0 - 10.5 K/uL   RBC 2.01 (L) 3.87 - 5.11 MIL/uL   Hemoglobin 5.9 (LL) 12.0  - 15.0 g/dL    Comment: REPEATED TO VERIFY THIS CRITICAL RESULT HAS VERIFIED AND BEEN CALLED TO B Harlem Bula RN BY KIRSTENE FORSYTH ON 04 15 2020 AT 1558, AND HAS BEEN READ BACK.     HCT 20.6 (L) 36.0 - 46.0 %   MCV 102.5 (H) 80.0 - 100.0 fL   MCH 29.4 26.0 - 34.0 pg   MCHC 28.6 (L) 30.0 - 36.0 g/dL   RDW 19.7 (H) 11.5 - 15.5 %   Platelets 720 (H) 150 - 400 K/uL   nRBC 0.4 (H) 0.0 - 0.2 %   Neutrophils Relative % 92 %   Neutro Abs 23.0 (H) 1.7 - 7.7 K/uL   Lymphocytes Relative 3 %   Lymphs Abs 0.8 0.7 - 4.0 K/uL   Monocytes Relative 2 %   Monocytes Absolute 0.5 0.1 - 1.0 K/uL   Eosinophils Relative 1 %   Eosinophils Absolute 0.3 0.0 - 0.5 K/uL   Basophils Relative 0 %   Basophils Absolute 0.0 0.0 - 0.1 K/uL   nRBC 0 0 /100 WBC   Myelocytes 2 %   Abs Immature Granulocytes 0.50 (H) 0.00 - 0.07 K/uL   Polychromasia PRESENT     Comment: Performed at [facility], [address], [place], [place] [address]  Comprehensive metabolic panel     Status: Abnormal   Collection Time: 07/09/18  3:24 PM  Result Value Ref Range   Sodium 142 135 - 145 mmol/L   Potassium 3.6 3.5 - 5.1 mmol/L   Chloride 109 98 - 111 mmol/L   CO2 21 (L) 22 - 32 mmol/L   Glucose, Bld 113 (H) 70 - 99 mg/dL   BUN 25 (H) 8 - 23 mg/dL   Creatinine, Ser 0.95 0.44 - 1.00 mg/dL   Calcium 8.4 (L) 8.9 - 10.3 mg/dL   Total Protein 5.7 (L) 6.5 - 8.1 g/dL   Albumin 2.6 (L) 3.5 - 5.0 g/dL   AST 32 15 - 41 U/L   ALT 23 0 - 44 U/L   Alkaline Phosphatase 122 38 - 126 U/L   Total Bilirubin 0.9 0.3 - 1.2 mg/dL   GFR calc non Af Amer 59 (L) >60 mL/min   GFR calc Af Amer >60 >60 mL/min   Anion gap 12 5 - 15    Comment: Performed at [facility], [place] [address] [place], [place] [address]  Brain natriuretic peptide     Status: Abnormal   Collection Time: 07/09/18  3:24 PM  Result Value Ref Range   B Natriuretic Peptide 252.3 (H) 0.0 - 100.0 pg/mL    Comment: Performed at [facility]  [address],  [place], [place] [address]  Troponin I - ONCE - STAT     Status: Abnormal   Collection Time: 07/09/18  3:24 PM  Result Value Ref Range   Troponin I 0.03 (HH) <0.03 ng/mL    Comment: CRITICAL RESULT CALLED TO, READ BACK BY AND VERIFIED WITHLeane Call [phone] 07/09/2018 D BRADLEY Performed at [facility], [place] [address], [place], [place] [address]   D-dimer, quantitative (not at [facility])     Status: Abnormal   Collection Time: 07/09/18  3:24 PM  Result Value Ref Range   D-Dimer, Quant 9.49 (H) 0.00 - 0.50 ug/mL-FEU    Comment: (NOTE) At the manufacturer cut-off of 0.50 ug/mL FEU, this assay has been documented to exclude PE with a sensitivity and negative predictive value of 97 to 99%.  At this time, this assay has not been approved by the FDA to exclude DVT/VTE. Results should be correlated with clinical presentation. Performed at [facility], [place] [address], [place], [place] [address]   Type and screen [place]     Status: None (Preliminary result)   Collection Time: 07/09/18  3:25 PM  Result Value Ref Range   ABO/RH(D) O POS    Antibody Screen NEG    Sample Expiration 07/12/2018    Unit Number [id number]    Blood Component Type RBC, LR IRR    Unit division 00    Status of Unit ISSUED    Transfusion Status OK TO TRANSFUSE    Crossmatch Result      Compatible Performed at [facility], [place] [address], [place], [place] [address]   Prepare RBC     Status: None   Collection Time: 07/09/18  4:14 PM  Result Value Ref Range   Order Confirmation      ORDER PROCESSED BY BLOOD BANK Performed at [facility], [place] [address] [place], [place] [address]   MRSA PCR Screening     Status: None   Collection Time: 07/09/18  4:17 PM  Result Value Ref Range   MRSA by PCR NEGATIVE NEGATIVE    Comment:        The GeneXpert MRSA Assay (FDA approved for NASAL specimens only), is one component of a comprehensive MRSA colonization surveillance program.  It is not intended to diagnose MRSA infection nor to guide or monitor treatment for MRSA infections. Performed at [facility], [place] [address], [place], [place] [address]   Lactic acid, plasma     Status: Abnormal   Collection Time: 07/09/18  4:38 PM  Result Value Ref Range   Lactic Acid, Venous 2.4 (HH) 0.5 - 1.9 mmol/L    Comment: CRITICAL RESULT CALLED TO, READ BACK BY AND VERIFIED WITHLeane Call [phone] 07/09/2018 D BRADLEY Performed at [facility], [place] [address] [place], [place] [address]   POC occult blood, ED     Status: Abnormal   Collection Time: 07/09/18  6:32 PM  Result Value Ref Range   Fecal Occult Bld POSITIVE (A) NEGATIVE   Ct Angio Chest Pe W And/or Wo Contrast  Result Date: 07/09/2018 CLINICAL DATA:  73 year old female with shortness of breath EXAM: CT ANGIOGRAPHY CHEST WITH CONTRAST TECHNIQUE: Multidetector CT imaging of the chest was performed using the standard protocol during bolus administration of intravenous contrast. Multiplanar CT image reconstructions and MIPs were obtained to evaluate the vascular anatomy. CONTRAST:  68mL OMNIPAQUE IOHEXOL 350 MG/ML SOLN COMPARISON:  04/04/2018, 03/17/2012  FINDINGS: Cardiovascular: Heart: Borderline cardiomegaly. No pericardial fluid/thickening. Calcifications of left anterior descending, circumflex, right coronary artery. Aorta: Unremarkable course, caliber, contour of the thoracic aorta. No aneurysm or dissection flap. No periaortic fluid. Pulmonary arteries: There are a few central filling defects within the subsegmental vessels of right middle lobe, right lower lobe, left lower lobe. No associated lung changes/infarction. Mediastinum/Nodes: No mediastinal adenopathy. Unremarkable appearance of the thoracic esophagus. Unremarkable appearance of the thoracic inlet and thyroid. Lungs/Pleura: Advanced paraseptal and centrilobular emphysema. Platelike scarring at the bilateral lung apices, relatively unchanged dating to  03/17/2012. No confluent airspace disease, pneumothorax, or pleural effusion. No evidence of edema. No endotracheal or endobronchial debris or bronchial wall thickening. Upper Abdomen: No acute. Musculoskeletal: No acute displaced fracture. Partially healed fracture of the lower sternum. No bony canal narrowing. Changes of vertebral augmentation of L1. Review of the MIP images confirms the above findings. IMPRESSION: CT is positive for a few bilateral subsegmental pulmonary emboli, with no evidence of lung infarction. These results were discussed by telephone at the time of interpretation on 07/09/2018 at 5:49 pm with Dr. Duffy Bruce. Advanced paraseptal and centrilobular emphysema. Emphysema (ICD10-J43.9). Aortic Atherosclerosis (ICD10-I70.0). Associated coronary artery disease. Electronically Signed   By: Corrie Mckusick D.O.   On: 07/09/2018 17:52   Dg Chest Portable 1 View  Result Date: 07/09/2018 CLINICAL DATA:  Shortness of breath EXAM: PORTABLE CHEST 1 VIEW COMPARISON:  07/02/2018 FINDINGS: There is no focal consolidation. There is biapical scarring. There is no pneumothorax. There is a small right pleural effusion. The heart and mediastinal contours are unremarkable. There are multiple right posterolateral rib fractures involving the second, third, fourth, fifth, 6, seventh and eighth ribs. IMPRESSION: Trace right pleural effusion. Multiple right rib fractures again noted. Electronically Signed   By: Kathreen Devoid   On: 07/09/2018 15:52    Pending Labs Unresulted Labs (From admission, onward)    Start     Ordered   07/09/18 2210  Hemoglobin and hematocrit, blood  Once-Timed,   STAT     07/09/18 2011   07/09/18 1611  Blood culture (routine x 2)  BLOOD CULTURE X 2,   STAT     07/09/18 1611   07/09/18 1603  Lactic acid, plasma  Now then every 2 hours,   STAT     07/09/18 1610   Signed and Held  Hematocrit  Now then every 6 hours,   R     Signed and Held   Signed and Held  Hemoglobin  Now then  every 6 hours,   R     Signed and Held   Visual merchandiser and Held  Basic metabolic panel  Tomorrow morning,   R     Signed and Held   Signed and Held  CBC  Tomorrow morning,   R     Signed and Held          Vitals/Pain Today's Vitals   07/09/18 1930 07/09/18 1945 07/09/18 2000 07/09/18 2015  BP: 131/66 135/62 131/69 (!) 152/72  Pulse: 91 89 88 96  Resp: 17 18 18  (!) 26  Temp:      TempSrc:      SpO2: 100% 100% 100% 99%  PainSc:        Isolation Precautions No active isolations  Medications Medications  0.9 %  sodium chloride infusion (Manually program via Guardrails IV Fluids) (has no administration in time range)  ceFEPIme (MAXIPIME) 2 g in sodium chloride 0.9 % 100 mL IVPB (0 g  Intravenous Stopped 07/09/18 1722)  vancomycin (VANCOCIN) 500 mg in sodium chloride 0.9 % 100 mL IVPB (has no administration in time range)  vancomycin (VANCOCIN) IVPB 1000 mg/200 mL premix (0 mg Intravenous Stopped 07/09/18 1820)  sodium chloride 0.9 % bolus 500 mL (0 mLs Intravenous Stopped 07/09/18 2041)  iohexol (OMNIPAQUE) 350 MG/ML injection 75 mL (75 mLs Intravenous Contrast Given 07/09/18 1732)    Mobility non-ambulatory High fall risk   Focused Assessments Cardiac Assessment Handoff:    Lab Results  Component Value Date   CKTOTAL 97 07/01/2013   CKMB 4.4 (H) 03/10/2010   TROPONINI 0.03 (HH) 07/09/2018   Lab Results  Component Value Date   DDIMER 9.49 (H) 07/09/2018   Does the Patient currently have chest pain? No  , Pulmonary Assessment Handoff:  Lung sounds: Bilateral Breath Sounds: Clear, Diminished O2 Device: Room Air O2 Flow Rate (L/min): 2 L/min      R Recommendations: See Admitting Provider Note  Report given to:   Additional Notes:  Received 1 unit of blood.  Per blood back needs to have H&H redrawn at 2210 and see if she needs second one still. Abx given to cover possible PNA with elevated WBC. IVs salined locked at this time.  Elevated DVT and positive PE but  discussing with Gastro due to recent surgery. Told to hold heparin by EDP until told otherwise

## 2018-07-09 NOTE — ED Notes (Signed)
Per blood bank they want a H&H drawn 2 hrs post transfusion of 1st bag of blood.

## 2018-07-09 NOTE — ED Notes (Signed)
Patient transported to CT 

## 2018-07-09 NOTE — H&P (Signed)
History and Physical  [name] TKW:[id number] DOB: 18-Aug-1945 DOA: 07/09/2018  Referring physician: ER physician PCP: Jeanette Caprice, PA-C  Outpatient Specialists: Orthopedics consult return Patient coming from: Skilled nursing facility  Chief Complaint: Shortness of breath/hypoxia (O2 sat of 87% on room air)  HPI: Patient is a 73 year old Caucasian female with past medical history significant for recent right total hip arthroplasty on June 27, 2022 right femoral neck fracture following a fall, anemia with recent drop in hemoglobin, vascular dementia, CVA, GERD, hypertension, COPD, grade 1 diastolic dysfunction and carotid artery aneurysm.  Apparently, patient was on the day hospitalist team prior to the fall.  After right hip surgery, patient was discharged to skilled nursing facility for palliative care follow-up.  Prior to discharge to skilled nursing facility on July 03, 2018, patient was noted to be tachycardic and short of breath, but was significantly anemic.  Shortness of breath has continued with hypoxia (O2 sat of 87% on room air).  Work-up done on presentation to the ER revealed hemoglobin of 5.9 g/dL, positive stool occult blood and bilateral subsegmental pulmonary emboli.  Respiratory rate was in the 30s, with heart rate of 419 bpm and blood pressure 130/57 mmHg.  Worsening leukocytosis is noted, with WBC of 25 (up from 15 just 6 days ago), hemoglobin has also dropped from 8.3 g/dL 6 days ago to 5.9 g/dL, thrombocytosis is noted.  Lactic acid is 2.4, albumin of 2.6 with total protein of 5.7.  Due to dementing process, patient is not able to give any significant history.  ER physician, Dr. Bland Span is liaising with GI physician, Dr. Michelene Heady regarding optimal management of bilateral pulmonary emboli in the setting of significant anemia with positive stool occult blood (input is highly appreciated).  ED Course: ER team is transfusing 2 units of packed red blood cells.  ER provider  is also discussing optimal management of pulmonary emboli in setting of significant anemia with positive stool occult blood with the GI team.  Pertinent labs: Revealed WBC of 25, hemoglobin of 5.8, hematocrit of 20.6, MCV 102.5 and platelet count of 720.  Troponin is 92 with absolute neutrophil of 23.  Chemistry reveals sodium of 142, potassium of 3.6, chloride 109, CO2 21, BUN of 25 with creatinine of 0.95 blood sugar 113.  Cardiac BNP is 252.  Troponin 0 0.03.  Lactic acid is 2.4.  CTA chest reveals bilateral subsegmental pulmonary emboli.  Chest x-ray reveals trace right-sided pleural effusion with multiple right-sided rib fractures.  EKG: Independently reviewed.  Low voltage EKG.  Imaging: independently reviewed.   Review of Systems: Unobtainable due to dementing illness.  Past Medical History:  Diagnosis Date   Carotid artery aneurysm ([place]) 03/04/2012   COPD (chronic obstructive pulmonary disease) ([name])    Depression    DJD (degenerative joint disease)    GERD (gastroesophageal reflux disease)    Hypertension    Stroke [facility])    was on Plavix - not now   Vascular dementia [facility])     Past Surgical History:  Procedure Laterality Date   CATARACT EXTRACTION Bilateral    COLONOSCOPY  2004   HERNIA REPAIR Right 2014   inguinal   KYPHOPLASTY N/A 10/28/2014   Procedure: KYPHOPLASTY;  Surgeon: Phylliss Bob, MD;  Location: [place];  Service: Orthopedics;  Laterality: N/A;  Lumbar 1 kyphoplasty   MICROLARYNGOSCOPY WITH CO2 LASER AND EXCISION OF VOCAL CORD LESION N/A 06/05/2013   Procedure: MICROLARYNGOSCOPY WITH CO2 LASER AND EXCISION OF VOCAL CORD POLYPS;  Surgeon:  Melida Quitter, MD;  Location: [place];  Service: ENT;  Laterality: N/A;   TONSILLECTOMY     TOTAL HIP ARTHROPLASTY Right 06/27/2018   Procedure: TOTAL HIP ARTHROPLASTY ANTERIOR APPROACH;  Surgeon: Rod Can, MD;  Location: [place];  Service: Orthopedics;  Laterality: Right;   TUBAL LIGATION  30 years   VIDEO  BRONCHOSCOPY  04/24/2012   Procedure: VIDEO BRONCHOSCOPY WITHOUT FLUORO;  Surgeon: Rigoberto Noel, MD;  Location: [place];  Service: Cardiopulmonary;  Laterality: Bilateral;     reports that she has quit smoking. Her smoking use included cigarettes. She has a 7.50 pack-year smoking history. She has never used smokeless tobacco. She reports that she does not drink alcohol or use drugs.  Allergies  Allergen Reactions   Amoxapine And Related Nausea And Vomiting   Amoxicillin Itching    itching   Ciprocinonide [Fluocinolone] Other (See Comments)    Pt unsure   Ciprofloxacin Other (See Comments)    Pt unsure   Doxycycline Nausea And Vomiting   Erythromycin Hives   Sulfa Antibiotics Other (See Comments)    Unknown-reaction as a child   Zofran [Ondansetron Hcl] Nausea And Vomiting    Family History  Problem Relation Age of Onset   Breast cancer Mother    Breast cancer Sister      Prior to Admission medications   Medication Sig Start Date End Date Taking? Authorizing Provider  acetaminophen (TYLENOL) 500 MG tablet Take 500 mg by mouth every 4 (four) hours as needed for mild pain.    [provider]  alum & mag hydroxide-simeth ([place]) 200-200-20 MG/5ML suspension Take 30 mLs by mouth every 6 (six) hours as needed for indigestion or heartburn.    [provider]  aspirin EC 81 MG tablet Take 1 tablet (81 mg total) by mouth 2 (two) times daily. 07/03/18 07/03/19  Elodia Florence., MD  atorvastatin (LIPITOR) 20 MG tablet Take 1 tablet (20 mg total) by mouth daily at 6 PM. 02/29/16   Reyne Dumas, MD  b complex vitamins tablet Take 1 tablet by mouth daily. 01/25/16   Rolene Course, PA-C  clopidogrel (PLAVIX) 75 MG tablet Take 75 mg by mouth daily.    [provider]  gabapentin (NEURONTIN) 300 MG capsule Take 300 mg by mouth at bedtime. 06/12/16   [provider]  guaiFENesin (ROBITUSSIN) 100 MG/5ML SOLN Take 10 mLs by mouth every 6 (six)  hours as needed for cough or to loosen phlegm.    [provider]  hyoscyamine (LEVSIN, ANASPAZ) 0.125 MG tablet Take 0.125 mg by mouth See admin instructions. Take 1 tablet by mouth every 2 hours as needed excessive secretions    [provider]  ipratropium-albuterol (DUONEB) 0.5-2.5 (3) MG/3ML SOLN Take 3 mLs by nebulization every 6 (six) hours as needed (wheezing/congestion).    [provider]  loperamide (IMODIUM) 2 MG capsule Take 2 mg by mouth as needed for diarrhea or loose stools.    [provider]  losartan (COZAAR) 100 MG tablet Take 100 mg by mouth daily. 06/13/16   [provider]  magnesium hydroxide (MILK OF MAGNESIA) 400 MG/5ML suspension Take 30 mLs by mouth at bedtime as needed for mild constipation.    [provider]  mirtazapine (REMERON) 30 MG tablet Take 30 mg by mouth at bedtime. 06/28/16   [provider]  OLANZapine (ZYPREXA) 7.5 MG tablet Take 7.5 mg by mouth 2 (two) times daily.     [provider]  polyethylene glycol (MIRALAX / GLYCOLAX) packet Take 17 g by mouth daily. Patient taking differently: Take 17 g by mouth daily as needed for mild constipation.  04/08/18   Bonnielee Haff, MD  tamsulosin (FLOMAX) 0.4 MG CAPS capsule Take 1 capsule (0.4 mg total) by mouth daily after supper for 30 days. 07/03/18 08/02/18  Elodia Florence., MD  traZODone (DESYREL) 50 MG tablet Take 50 mg by mouth at bedtime as needed for sleep.    [provider]  trolamine salicylate (ASPERCREME) 10 % cream Apply 1 application topically 2 (two) times daily as needed for muscle pain.     [provider]  vitamin C (ASCORBIC ACID) 500 MG tablet Take 500 mg by mouth daily.    [provider]    Physical Exam: Vitals:   07/09/18 1930 07/09/18 1945 07/09/18 2000 07/09/18 2015  BP: 131/66 135/62 131/69 (!) 152/72  Pulse: 91 89 88 96  Resp: 17 18 18  (!) 26  Temp:      TempSrc:      SpO2: 100% 100%  100% 99%   Constitutional:   Appears calm and comfortable Eyes:   No pallor. No jaundice.  ENMT:   external ears, nose appear normal Neck:   Neck is supple. No JVD Respiratory:   CTA bilaterally, no w/r/r.   Respiratory effort normal. No retractions or accessory muscle use Cardiovascular:   S1S2  No LE extremity edema   Abdomen:   Abdomen is soft and non tender. Organs are difficult to assess. Neurologic:   Awake and alert.  Moves all limbs.  Wt Readings from Last 3 Encounters:  06/26/18 40.8 kg  04/04/18 50.4 kg  06/29/16 49.9 kg    I have personally reviewed following labs and imaging studies  Labs on Admission:  CBC: Recent Labs  Lab 07/03/18 0215 07/09/18 1524  WBC 15.0* 25.0*  NEUTROABS  --  23.0*  HGB 8.3* 5.9*  HCT 24.8* 20.6*  MCV 85.5 102.5*  PLT 353 277*   Basic Metabolic Panel: Recent Labs  Lab 07/03/18 0215 07/09/18 1524  NA 139 142  K 3.2* 3.6  CL 107 109  CO2 24 21*  GLUCOSE 110* 113*  BUN 13 25*  CREATININE 0.71 0.95  CALCIUM 8.1* 8.4*  MG 1.7  --    Liver Function Tests: Recent Labs  Lab 07/09/18 1524  AST 32  ALT 23  ALKPHOS 122  BILITOT 0.9  PROT 5.7*  ALBUMIN 2.6*   No results for input(s): LIPASE, AMYLASE in the last 168 hours. No results for input(s): AMMONIA in the last 168 hours. Coagulation Profile: No results for input(s): INR, PROTIME in the last 168 hours. Cardiac Enzymes: Recent Labs  Lab 07/09/18 1524  TROPONINI 0.03*   BNP (last 3 results) No results for input(s): PROBNP in the last 8760 hours. HbA1C: No results for input(s): HGBA1C in the last 72 hours. CBG: No results for input(s): GLUCAP in the last 168 hours. Lipid Profile: No results for input(s): CHOL, HDL, LDLCALC, TRIG, CHOLHDL, LDLDIRECT in the last 72 hours. Thyroid Function Tests: No results for input(s): TSH, T4TOTAL, FREET4, T3FREE, THYROIDAB in the last 72 hours. Anemia Panel: No results for input(s): VITAMINB12, FOLATE,  FERRITIN, TIBC, IRON, RETICCTPCT in the last 72 hours. Urine analysis:    Component Value Date/Time   COLORURINE YELLOW 06/27/2018 0139   APPEARANCEUR HAZY (A) 06/27/2018 0139   LABSPEC 1.013 06/27/2018 0139   PHURINE 5.0 06/27/2018 0139   GLUCOSEU NEGATIVE 06/27/2018 0139  HGBUR SMALL (A) 06/27/2018 0139   BILIRUBINUR NEGATIVE 06/27/2018 0139   KETONESUR 5 (A) 06/27/2018 0139   PROTEINUR NEGATIVE 06/27/2018 0139   UROBILINOGEN 0.2 10/26/2014 1503   NITRITE NEGATIVE 06/27/2018 0139   LEUKOCYTESUR TRACE (A) 06/27/2018 0139   Sepsis Labs: @LABRCNTIP (procalcitonin:4,lacticidven:4) ) Recent Results (from the past 240 hour(s))  MRSA PCR Screening     Status: None   Collection Time: 07/09/18  4:17 PM  Result Value Ref Range Status   MRSA by PCR NEGATIVE NEGATIVE Final    Comment:        The GeneXpert MRSA Assay (FDA approved for NASAL specimens only), is one component of a comprehensive MRSA colonization surveillance program. It is not intended to diagnose MRSA infection nor to guide or monitor treatment for MRSA infections. Performed at [facility], [place] [address], [place], [place] [address]       Radiological Exams on Admission: Ct Angio Chest Pe W And/or Wo Contrast  Result Date: 07/09/2018 CLINICAL DATA:  73 year old female with shortness of breath EXAM: CT ANGIOGRAPHY CHEST WITH CONTRAST TECHNIQUE: Multidetector CT imaging of the chest was performed using the standard protocol during bolus administration of intravenous contrast. Multiplanar CT image reconstructions and MIPs were obtained to evaluate the vascular anatomy. CONTRAST:  31mL OMNIPAQUE IOHEXOL 350 MG/ML SOLN COMPARISON:  04/04/2018, 03/17/2012 FINDINGS: Cardiovascular: Heart: Borderline cardiomegaly. No pericardial fluid/thickening. Calcifications of left anterior descending, circumflex, right coronary artery. Aorta: Unremarkable course, caliber, contour of the thoracic aorta. No aneurysm or dissection  flap. No periaortic fluid. Pulmonary arteries: There are a few central filling defects within the subsegmental vessels of right middle lobe, right lower lobe, left lower lobe. No associated lung changes/infarction. Mediastinum/Nodes: No mediastinal adenopathy. Unremarkable appearance of the thoracic esophagus. Unremarkable appearance of the thoracic inlet and thyroid. Lungs/Pleura: Advanced paraseptal and centrilobular emphysema. Platelike scarring at the bilateral lung apices, relatively unchanged dating to 03/17/2012. No confluent airspace disease, pneumothorax, or pleural effusion. No evidence of edema. No endotracheal or endobronchial debris or bronchial wall thickening. Upper Abdomen: No acute. Musculoskeletal: No acute displaced fracture. Partially healed fracture of the lower sternum. No bony canal narrowing. Changes of vertebral augmentation of L1. Review of the MIP images confirms the above findings. IMPRESSION: CT is positive for a few bilateral subsegmental pulmonary emboli, with no evidence of lung infarction. These results were discussed by telephone at the time of interpretation on 07/09/2018 at 5:49 pm with Dr. Duffy Bruce. Advanced paraseptal and centrilobular emphysema. Emphysema (ICD10-J43.9). Aortic Atherosclerosis (ICD10-I70.0). Associated coronary artery disease. Electronically Signed   By: Corrie Mckusick D.O.   On: 07/09/2018 17:52   Dg Chest Portable 1 View  Result Date: 07/09/2018 CLINICAL DATA:  Shortness of breath EXAM: PORTABLE CHEST 1 VIEW COMPARISON:  07/02/2018 FINDINGS: There is no focal consolidation. There is biapical scarring. There is no pneumothorax. There is a small right pleural effusion. The heart and mediastinal contours are unremarkable. There are multiple right posterolateral rib fractures involving the second, third, fourth, fifth, 6, seventh and eighth ribs. IMPRESSION: Trace right pleural effusion. Multiple right rib fractures again noted. Electronically Signed   By:  Kathreen Devoid   On: 07/09/2018 15:52    EKG: Independently reviewed.   Active Problems:   Symptomatic anemia   Assessment/Plan Shortness of breath/hypoxia: Hemoglobin is 5.9 g/dL Stool occult blood is positive CTA is positive for bilateral subsegmental emboli Patient has been transfused 2 units of packed red blood cells  Symptomatic anemia: Hemoglobin is 5.9 g/dL. Patient  is currently being transfused with 2 units of packed red blood cells.   Monitor H&H. Transfuse packed red blood cells as needed.    Acute blood loss anemia: Currently see above.  Bilateral subsegmental pulmonary emboli: Proceed with Doppler ultrasound of the lower extremities. Supplemental oxygen. Awaiting GI team input regarding management of PE in setting of anemia and positive for occult blood.   History of recent right hip fracture status post right total hip arthroplasty: Continue postop care as per orthopedic team.  Vascular dementia without behavioral problems: Manage supportively.  Leukocytosis:  Likely supportive. Continue to monitor and assess.  COPD: Stable for now. Manage expectantly.  Hypertension: Continue to optimize.  Goal of care needs to be addressed.  DVT prophylaxis: None.  Patient has GI bleed and PE.  Could also have DVT in lower extremities. Code Status: DNR Family Communication:  Disposition Plan: Depend on hospital course Consults called: GI has been consulted.  ER discussed with orthopedic team. Admission status: Inpatient.  Time spent: 65 minutes  Dana Allan, MD  [facility] Pager #: [phone] 7PM-7AM contact night coverage as above  07/09/2018, 8:39 PM

## 2018-07-10 ENCOUNTER — Inpatient Hospital Stay (HOSPITAL_COMMUNITY): Payer: Medicare Other

## 2018-07-10 ENCOUNTER — Ambulatory Visit: Payer: Self-pay

## 2018-07-10 ENCOUNTER — Other Ambulatory Visit: Payer: Self-pay

## 2018-07-10 DIAGNOSIS — D649 Anemia, unspecified: Secondary | ICD-10-CM

## 2018-07-10 DIAGNOSIS — R195 Other fecal abnormalities: Secondary | ICD-10-CM

## 2018-07-10 DIAGNOSIS — I82409 Acute embolism and thrombosis of unspecified deep veins of unspecified lower extremity: Secondary | ICD-10-CM

## 2018-07-10 DIAGNOSIS — J449 Chronic obstructive pulmonary disease, unspecified: Secondary | ICD-10-CM

## 2018-07-10 DIAGNOSIS — I2699 Other pulmonary embolism without acute cor pulmonale: Secondary | ICD-10-CM

## 2018-07-10 DIAGNOSIS — S72009A Fracture of unspecified part of neck of unspecified femur, initial encounter for closed fracture: Secondary | ICD-10-CM

## 2018-07-10 LAB — CBC
HCT: 20.9 % — ABNORMAL LOW (ref 36.0–46.0)
Hemoglobin: 7 g/dL — ABNORMAL LOW (ref 12.0–15.0)
MCH: 31.3 pg (ref 26.0–34.0)
MCHC: 33.5 g/dL (ref 30.0–36.0)
MCV: 93.3 fL (ref 80.0–100.0)
Platelets: 536 10*3/uL — ABNORMAL HIGH (ref 150–400)
RBC: 2.24 MIL/uL — ABNORMAL LOW (ref 3.87–5.11)
RDW: 17.3 % — ABNORMAL HIGH (ref 11.5–15.5)
WBC: 21.3 10*3/uL — ABNORMAL HIGH (ref 4.0–10.5)
nRBC: 0.7 % — ABNORMAL HIGH (ref 0.0–0.2)

## 2018-07-10 LAB — BASIC METABOLIC PANEL
Anion gap: 11 (ref 5–15)
BUN: 20 mg/dL (ref 8–23)
CO2: 20 mmol/L — ABNORMAL LOW (ref 22–32)
Calcium: 7.8 mg/dL — ABNORMAL LOW (ref 8.9–10.3)
Chloride: 111 mmol/L (ref 98–111)
Creatinine, Ser: 0.75 mg/dL (ref 0.44–1.00)
GFR calc Af Amer: >60 mL/min (ref >60)
GFR calc non Af Amer: >60 mL/min (ref >60)
Glucose, Bld: 90 mg/dL (ref 70–99)
Potassium: 3.1 mmol/L — ABNORMAL LOW (ref 3.5–5.1)
Sodium: 142 mmol/L (ref 135–145)

## 2018-07-10 LAB — HEMATOCRIT
HCT: 22.8 % — ABNORMAL LOW (ref 36.0–46.0)
HCT: 21.5 % — ABNORMAL LOW (ref 36.0–46.0)

## 2018-07-10 LAB — HEMOGLOBIN
Hemoglobin: 7.5 g/dL — ABNORMAL LOW (ref 12.0–15.0)
Hemoglobin: 7.2 g/dL — ABNORMAL LOW (ref 12.0–15.0)

## 2018-07-10 MED ORDER — MAGNESIUM SULFATE 2 GM/50ML IV SOLN
2.0000 g | Freq: Once | INTRAVENOUS | Status: AC
  Administered 2018-07-10: 2 g via INTRAVENOUS
  Filled 2018-07-10: qty 50

## 2018-07-10 MED ORDER — POTASSIUM CHLORIDE 10 MEQ/100ML IV SOLN
10.0000 meq | INTRAVENOUS | Status: AC
  Administered 2018-07-10 – 2018-07-11 (×4): 10 meq via INTRAVENOUS
  Filled 2018-07-10 (×4): qty 100

## 2018-07-10 MED ORDER — HEPARIN (PORCINE) 25000 UT/250ML-% IV SOLN
800.0000 [IU]/h | INTRAVENOUS | Status: DC
  Administered 2018-07-10: 650 [IU]/h via INTRAVENOUS
  Filled 2018-07-10: qty 250

## 2018-07-10 NOTE — Patient Outreach (Addendum)
[facility]) Care Management  07/10/2018  [name] Feb 08, 1946 [id number]   Transition of care  Referral date: 07/08/18 Referral source: discharged from [facility]  On 07/03/18 Insurance:  [employer]  Update received from [place], [place].  Patient was admitted to the hospital on 07/09/18.  PLAN; RNCM will notify [facility] care management hospital liaisons to follow up regarding patients discharge disposition.  No further follow up needed by this RNCM at this time.   Quinn Plowman RN,BSN,CCM [facility] Telephonic  [phone]

## 2018-07-10 NOTE — Progress Notes (Signed)
ANTICOAGULATION CONSULT NOTE - Initial Consult  Pharmacy Consult for heparin Indication: pulmonary embolus  Allergies  Allergen Reactions  . Amoxapine And Related Nausea And Vomiting  . Amoxicillin Itching    Itching Details unknown, info from [facility]  . Ciprocinonide [Fluocinolone] Other (See Comments)    Pt unsure  . Ciprofloxacin Other (See Comments)    Pt unsure  . Doxycycline Nausea And Vomiting  . Erythromycin Hives  . Sulfa Antibiotics Other (See Comments)    Unknown-reaction as a child  . Zofran [Ondansetron Hcl] Nausea And Vomiting    Patient Measurements: Height: 5\' 2"  (157.5 cm)(per chart: pt unable to tell me) Weight: 100 lb 8.5 oz (45.6 kg) IBW/kg (Calculated) : 50.1 Heparin Dosing Weight: 45kg  Vital Signs: Temp: 97.9 F (36.6 C) (04/16 1353) Temp Source: Axillary (04/16 1353) BP: 127/71 (04/16 1353) Pulse Rate: 109 (04/16 1353)  Labs: Recent Labs    07/09/18 1524  07/10/18 0359 07/10/18 0958 07/10/18 1559  HGB 5.9*   < > 7.0* 7.5* 7.2*  HCT 20.6*   < > 20.9* 22.8* 21.5*  PLT 720*  --  536*  --   --   CREATININE 0.95  --  0.75  --   --   TROPONINI 0.03*  --   --   --   --    < > = values in this interval not displayed.    Estimated Creatinine Clearance: 44.4 mL/min (by C-G formula based on SCr of 0.75 mg/dL).   Medical History: Past Medical History:  Diagnosis Date  . Carotid artery aneurysm ([place]) 03/04/2012  . COPD (chronic obstructive pulmonary disease) ([place])   . Depression   . DJD (degenerative joint disease)   . GERD (gastroesophageal reflux disease)   . Hypertension   . Stroke [facility])    was on Plavix - not now  . Vascular dementia [facility])    Assessment: 72 year old female with pulmonary embolus. Anemia noted on admission, hgb stable in 7s today. Family does not wish to have GI work up (FOBT +). New orders received to start IV heparin tonight and continue overnight with plans to transition to DOAC if stable overnight. Given FOBT +/anemia  will not bolus heparin.   Goal of Therapy:  Heparin level 0.3-0.7 units/ml Monitor platelets by anticoagulation protocol: Yes   Plan:  Start heparin infusion at 650 units/hr Check anti-Xa level in 8 hours and daily while on heparin Continue to monitor H&H and platelets  Erin Hearing PharmD., BCPS Clinical Pharmacist 07/10/2018 6:16 PM

## 2018-07-10 NOTE — Progress Notes (Signed)
PROGRESS NOTE    [name]  PPI:[id number] DOB: 05/09/1945 DOA: 07/09/2018 PCP: Jeanette Caprice, PA-C   Brief Narrative:  73 year old with history of COPD, depression, GERD, hypertension, vascular dementia who is under hospice care at [facility] home came to the hospital after sustaining a fall earlier in April with closed head injury/hematoma, right-sided rib fractures and hip fracture.  Underwent right total hip arthroplasty and eventually was discharged on aspirin 81 mg twice daily for DVT prophylaxis.  Now presents with symptomatic anemia/shortness of breath found to have hemoglobin less than 6 with bilateral pulmonary embolism.  Gastroenterology consulted.   Assessment & Plan:   Active Problems:   Symptomatic anemia  Exertional shortness of breath, multifactorial Acute blood loss anemia on chronic disease Bilateral subsegmental pulmonary embolism, no cor pulmonale - Patient is Hemoccult positive.  Gastroenterology consulted.  Very difficult to determine anticoagulation in the setting of symptomatic anemia. - Had an extensive discussion with the patient's daughter this morning regarding anticoagulation in the setting of DVT and GI bleeding.  Daughter does not want any extensive procedures such as endoscopic/colonoscopy or IVC filter placement.  She understands she has very limited option and will make that decision when she speaks with gastroenterology team today.  I have explained her all the risks and benefit regarding anticoagulation versus no anticoagulation.  She understands and is very appreciative. -Lower extremity Doppler is negative for DVT. -Status post 2 units of PRBC transfusion.  Hemoglobin stable for now.  Recent fall with right hip fracture requiring total hip arthroplasty - Postop routine care.  Needs rehab.  Vascular dementia without behavioral disturbances -Supportive care  Leukocytosis -Likely reactive  COPD with emphysema - Stable.  Bronchodilators  PRN.  Goals of care discussion-daughter confirms that patient is on hospice and does not want any aggressive medical treatment.  No procedures as well.  Patient remains DNR/DNI.  Daughter will think about it and report back to [place] if she would like to proceed with anticoagulation or not in the setting of DVT and GI bleed  DVT prophylaxis: None due to concerns of bleeding Code Status: DNR/DNI Family Communication: Spoke with the patient daughter over the phone extensively Disposition Plan: Maintain hospital stay  Consultants:   Gastroenterology  Procedures:   None  Antimicrobials:   None   Subjective:  Patient is awake laying in the bed pleasantly confused.  Denies any complaints.  Asking for water.   Review of Systems Otherwise negative except as per HPI, including: General: Denies fever, chills, night sweats or unintended weight loss. Resp: Denies cough, wheezing, shortness of breath. Cardiac: Denies chest pain, palpitations, orthopnea, paroxysmal nocturnal dyspnea. GI: Denies abdominal pain, nausea, vomiting, diarrhea or constipation GU: Denies dysuria, frequency, hesitancy or incontinence MS: Denies muscle aches, joint pain or swelling Neuro: Denies headache, neurologic deficits (focal weakness, numbness, tingling), abnormal gait Psych: Denies anxiety, depression, SI/HI/AVH Skin: Denies new rashes or lesions ID: Denies sick contacts, exotic exposures, travel  Objective: Vitals:   07/09/18 2130 07/09/18 2212 07/09/18 2344 07/10/18 0610  BP: 136/67 (!) 143/52 139/65 122/62  Pulse: (!) 102 (!) 101 99 89  Resp: (!) 27 (!) 29 (!) 22 (!) 22  Temp:  98.5 F (36.9 C) 98.5 F (36.9 C) 97.7 F (36.5 C)  TempSrc:  Oral Oral Oral  SpO2: 91% 99% 97% 99%  Weight:  46.6 kg  45.6 kg  Height:  5\' 2"  (1.575 m)      Intake/Output Summary (Last 24 hours) at 07/10/2018 1153 Last data  filed at 07/10/2018 0611 Gross per 24 hour  Intake 800 ml  Output 800 ml  Net 0 ml   Filed  Weights   07/09/18 2212 07/10/18 0610  Weight: 46.6 kg 45.6 kg    Examination:  General exam: Appears calm and comfortable, elderly frail-appearing.  2 L nasal cannula. Respiratory system: Diminished breath sounds at the bases Cardiovascular system: S1 & S2 heard, RRR. No JVD, murmurs, rubs, gallops or clicks. No pedal edema. Gastrointestinal system: Abdomen is nondistended, soft and nontender. No organomegaly or masses felt. Normal bowel sounds heard. Central nervous system: Alert awake oriented only to her name which is more or less her baseline.  No focal neuro deficits. Extremities: Symmetric 4 x 5 power. Skin: No rashes, lesions or ulcers Psychiatry: Poor insight and judgment    Data Reviewed:   CBC: Recent Labs  Lab 07/09/18 1524 07/09/18 2222 07/10/18 0359 07/10/18 0958  WBC 25.0*  --  21.3*  --   NEUTROABS 23.0*  --   --   --   HGB 5.9* 7.6* 7.0* 7.5*  HCT 20.6* 23.7* 20.9* 22.8*  MCV 102.5*  --  93.3  --   PLT 720*  --  536*  --    Basic Metabolic Panel: Recent Labs  Lab 07/09/18 1524 07/10/18 0359  NA 142 142  K 3.6 3.1*  CL 109 111  CO2 21* 20*  GLUCOSE 113* 90  BUN 25* 20  CREATININE 0.95 0.75  CALCIUM 8.4* 7.8*   GFR: Estimated Creatinine Clearance: 44.4 mL/min (by C-G formula based on SCr of 0.75 mg/dL). Liver Function Tests: Recent Labs  Lab 07/09/18 1524  AST 32  ALT 23  ALKPHOS 122  BILITOT 0.9  PROT 5.7*  ALBUMIN 2.6*   No results for input(s): LIPASE, AMYLASE in the last 168 hours. No results for input(s): AMMONIA in the last 168 hours. Coagulation Profile: No results for input(s): INR, PROTIME in the last 168 hours. Cardiac Enzymes: Recent Labs  Lab 07/09/18 1524  TROPONINI 0.03*   BNP (last 3 results) No results for input(s): PROBNP in the last 8760 hours. HbA1C: No results for input(s): HGBA1C in the last 72 hours. CBG: No results for input(s): GLUCAP in the last 168 hours. Lipid Profile: No results for input(s):  CHOL, HDL, LDLCALC, TRIG, CHOLHDL, LDLDIRECT in the last 72 hours. Thyroid Function Tests: No results for input(s): TSH, T4TOTAL, FREET4, T3FREE, THYROIDAB in the last 72 hours. Anemia Panel: No results for input(s): VITAMINB12, FOLATE, FERRITIN, TIBC, IRON, RETICCTPCT in the last 72 hours. Sepsis Labs: Recent Labs  Lab 07/09/18 1638 07/09/18 2222  LATICACIDVEN 2.4* 1.2    Recent Results (from the past 240 hour(s))  MRSA PCR Screening     Status: None   Collection Time: 07/09/18  4:17 PM  Result Value Ref Range Status   MRSA by PCR NEGATIVE NEGATIVE Final    Comment:        The GeneXpert MRSA Assay (FDA approved for NASAL specimens only), is one component of a comprehensive MRSA colonization surveillance program. It is not intended to diagnose MRSA infection nor to guide or monitor treatment for MRSA infections. Performed at [facility], [place] [address] [place], [place] [address]   Blood culture (routine x 2)     Status: None (Preliminary result)   Collection Time: 07/09/18  4:36 PM  Result Value Ref Range Status   Specimen Description BLOOD RIGHT ANTECUBITAL  Final   Special Requests   Final  BOTTLES DRAWN AEROBIC AND ANAEROBIC Blood Culture adequate volume   Culture   Final    NO GROWTH < 24 HOURS Performed at [facility], [place] [address] [place], [place] [address]    Report Status PENDING  Incomplete  Blood culture (routine x 2)     Status: None (Preliminary result)   Collection Time: 07/09/18  4:38 PM  Result Value Ref Range Status   Specimen Description BLOOD LEFT HAND  Final   Special Requests   Final    BOTTLES DRAWN AEROBIC ONLY Blood Culture results may not be optimal due to an inadequate volume of blood received in culture bottles   Culture   Final    NO GROWTH < 24 HOURS Performed at [facility], [place] [address], [place], [place] [address]    Report Status PENDING  Incomplete         Radiology Studies: Ct Angio Chest Pe W  And/or Wo Contrast  Result Date: 07/09/2018 CLINICAL DATA:  73 year old female with shortness of breath EXAM: CT ANGIOGRAPHY CHEST WITH CONTRAST TECHNIQUE: Multidetector CT imaging of the chest was performed using the standard protocol during bolus administration of intravenous contrast. Multiplanar CT image reconstructions and MIPs were obtained to evaluate the vascular anatomy. CONTRAST:  28mL OMNIPAQUE IOHEXOL 350 MG/ML SOLN COMPARISON:  04/04/2018, 03/17/2012 FINDINGS: Cardiovascular: Heart: Borderline cardiomegaly. No pericardial fluid/thickening. Calcifications of left anterior descending, circumflex, right coronary artery. Aorta: Unremarkable course, caliber, contour of the thoracic aorta. No aneurysm or dissection flap. No periaortic fluid. Pulmonary arteries: There are a few central filling defects within the subsegmental vessels of right middle lobe, right lower lobe, left lower lobe. No associated lung changes/infarction. Mediastinum/Nodes: No mediastinal adenopathy. Unremarkable appearance of the thoracic esophagus. Unremarkable appearance of the thoracic inlet and thyroid. Lungs/Pleura: Advanced paraseptal and centrilobular emphysema. Platelike scarring at the bilateral lung apices, relatively unchanged dating to 03/17/2012. No confluent airspace disease, pneumothorax, or pleural effusion. No evidence of edema. No endotracheal or endobronchial debris or bronchial wall thickening. Upper Abdomen: No acute. Musculoskeletal: No acute displaced fracture. Partially healed fracture of the lower sternum. No bony canal narrowing. Changes of vertebral augmentation of L1. Review of the MIP images confirms the above findings. IMPRESSION: CT is positive for a few bilateral subsegmental pulmonary emboli, with no evidence of lung infarction. These results were discussed by telephone at the time of interpretation on 07/09/2018 at 5:49 pm with Dr. Duffy Bruce. Advanced paraseptal and centrilobular emphysema.  Emphysema (ICD10-J43.9). Aortic Atherosclerosis (ICD10-I70.0). Associated coronary artery disease. Electronically Signed   By: Corrie Mckusick D.O.   On: 07/09/2018 17:52   Dg Chest Portable 1 View  Result Date: 07/09/2018 CLINICAL DATA:  Shortness of breath EXAM: PORTABLE CHEST 1 VIEW COMPARISON:  07/02/2018 FINDINGS: There is no focal consolidation. There is biapical scarring. There is no pneumothorax. There is a small right pleural effusion. The heart and mediastinal contours are unremarkable. There are multiple right posterolateral rib fractures involving the second, third, fourth, fifth, 6, seventh and eighth ribs. IMPRESSION: Trace right pleural effusion. Multiple right rib fractures again noted. Electronically Signed   By: Kathreen Devoid   On: 07/09/2018 15:52   Vas [place] Lower Extremity Venous (dvt)  Result Date: 07/10/2018  Lower Venous Study Indications: Pulmonary embolism.  Performing Technologist: Abram Sander RVS  Examination Guidelines: A complete evaluation includes B-mode imaging, spectral Doppler, color Doppler, and power Doppler as needed of all accessible portions of each vessel. Bilateral testing is considered an integral  part of a complete examination. Limited examinations for reoccurring indications may be performed as noted.  +---------+---------------+---------+-----------+----------+--------------+  RIGHT     Compressibility Phasicity Spontaneity Properties Summary         +---------+---------------+---------+-----------+----------+--------------+  CFV       Full            Yes       Yes                                    +---------+---------------+---------+-----------+----------+--------------+  SFJ       Full                                                             +---------+---------------+---------+-----------+----------+--------------+  FV Prox   Full                                                              +---------+---------------+---------+-----------+----------+--------------+  FV Mid    Full                                                             +---------+---------------+---------+-----------+----------+--------------+  FV Distal Full                                                             +---------+---------------+---------+-----------+----------+--------------+  PFV       Full                                                             +---------+---------------+---------+-----------+----------+--------------+  POP       Full            Yes       Yes                                    +---------+---------------+---------+-----------+----------+--------------+  PTV       Full                                                             +---------+---------------+---------+-----------+----------+--------------+  PERO  Not visualized  +---------+---------------+---------+-----------+----------+--------------+   +---------+---------------+---------+-----------+----------+--------------+  LEFT      Compressibility Phasicity Spontaneity Properties Summary         +---------+---------------+---------+-----------+----------+--------------+  CFV       Full            Yes       Yes                                    +---------+---------------+---------+-----------+----------+--------------+  SFJ       Full                                                             +---------+---------------+---------+-----------+----------+--------------+  FV Prox   Full                                                             +---------+---------------+---------+-----------+----------+--------------+  FV Mid    Full                                                             +---------+---------------+---------+-----------+----------+--------------+  FV Distal Full                                                              +---------+---------------+---------+-----------+----------+--------------+  PFV       Full                                                             +---------+---------------+---------+-----------+----------+--------------+  POP       Full            Yes       Yes                                    +---------+---------------+---------+-----------+----------+--------------+  PTV                                                        Not visualized  +---------+---------------+---------+-----------+----------+--------------+  PERO  Not visualized  +---------+---------------+---------+-----------+----------+--------------+     Summary: Right: There is no evidence of deep vein thrombosis in the lower extremity. However, portions of this examination were limited- see technologist comments above. No cystic structure found in the popliteal fossa. Left: There is no evidence of deep vein thrombosis in the lower extremity. However, portions of this examination were limited- see technologist comments above. No cystic structure found in the popliteal fossa.  *See table(s) above for measurements and observations.    Preliminary         Scheduled Meds:  sodium chloride   Intravenous Once   atorvastatin  20 mg Oral q1800   gabapentin  300 mg Oral QHS   losartan  100 mg Oral Daily   mirtazapine  30 mg Oral QHS   OLANZapine  7.5 mg Oral BID   tamsulosin  0.4 mg Oral QPC supper   vitamin C  500 mg Oral Daily   Continuous Infusions:   LOS: 1 day   Time spent= 35 mins    Deveney Bayon Arsenio Loader, MD [facility]  If 7PM-7AM, please contact night-coverage [email or web address] 07/10/2018, 11:53 AM

## 2018-07-10 NOTE — Progress Notes (Signed)
Spoke with the patients daughter. She has opted for proceeding with [facility] and monitoring her. Discussed NOAC vs Coumadin. She has opted for NOAC which more reasonable choice.  Will start heparin drip tonight. Monitor for any bleeding, if stable overnight. Will transition to NOAC in am and discharge her.   Call with questions if needed.

## 2018-07-10 NOTE — Consult Note (Addendum)
Referring Provider: [facility] Primary Care Physician:  Jeanette Caprice, PA-C Primary Gastroenterologist: unassigned     Reason for Consultation: FOBT + and need for anticoagulation.   ASSESSMENT / PLAN:    73 yo female, s/p recent femoral neck fracture repair, admitted yesterday with SOB / progressive anemia / new bilateral PE. On Plavix as outpatient. We have been asked to stratify risks of GI bleeding with initiation of anticoagulation. No none history of GIB, PUD, gastrointestinal surgeries. EGD in 2011 [facility]) was unrevealing, as was colonoscopy on same day. She takes a daily baby asa but apparently no other NSAIDS.  -After a unit of pRBC on 07/01/18 her hgb recovered some from the post-op anemia. Not clear why she has represented with a 2 gram drop now.  RP bleed is remote possibility, wouldn't explain FOBT+.  - Daughter doesn't want any GI procedures done. If risk of GI bleed is high then decision might to be to forgo anticoagulation. It isn't unreasonable to start anticoagulation and see how she does. If overt GI bleeding then can always discontinue it. I will discuss with daughter.   -If patient returning to Hospice then the other option is to forgo anti-coagulation and she how she does.   ADDENDUM 1:50 pm.   I spoke with daughter [name]. We discussed proc and cons of treating PE. Patient already on Plavix, adding another blood thinner further elevates risk of GI bleeding  Daughter wishes to proceed with anticoagulation. She understands that should overt GI bleeding occur, discontinuation of the anticoagulant may not be enough, patient could require another blood transfusion for stability.   HPI:    SAMIA KUKLA is a 73 y.o. female with advanced dementia, hx of CVA, and COPD. Patient has been in Hospice care. She recently fell and sustained a right femoral neck fracture which was repaired on 06/27/18. Patient discharge to SNF on 4/9. She had post-op anemia, transfused a  unit of PRBC on 4/7. Hgb 8.3 at time of discharge.   In the ED yesterday her hgb was 5.9, stool FOBT +. She had a CT angio of chest revealing bilateral PEs. We have been called to stratify risk of GI bleeding in patient who is FOBT+ presenting with progression of anemia.   Patient has dementia so not sure how reliable facts are but she denies any overt GI bleeding, bowel changes, abdominal pain,nausea or vomiting. No known hx of PUD. She is up to date on colon cancer screening. Patient has negative EGD and colonoscopy in 2011 by [facility] for evaluation of anemia.     Past Medical History:  Diagnosis Date   Carotid artery aneurysm ([place]) 03/04/2012   COPD (chronic obstructive pulmonary disease) ([name])    Depression    DJD (degenerative joint disease)    GERD (gastroesophageal reflux disease)    Hypertension    Stroke [facility])    was on Plavix - not now   Vascular dementia [facility])     Past Surgical History:  Procedure Laterality Date   CATARACT EXTRACTION Bilateral    COLONOSCOPY  2004   HERNIA REPAIR Right 2014   inguinal   KYPHOPLASTY N/A 10/28/2014   Procedure: KYPHOPLASTY;  Surgeon: Phylliss Bob, MD;  Location: [place];  Service: Orthopedics;  Laterality: N/A;  Lumbar 1 kyphoplasty   MICROLARYNGOSCOPY WITH CO2 LASER AND EXCISION OF VOCAL CORD LESION N/A 06/05/2013   Procedure: MICROLARYNGOSCOPY WITH CO2 LASER AND EXCISION OF VOCAL CORD POLYPS;  Surgeon: Melida Quitter, MD;  [facility];  Service: ENT;  Laterality: N/A;   TONSILLECTOMY     TOTAL HIP ARTHROPLASTY Right 06/27/2018   Procedure: TOTAL HIP ARTHROPLASTY ANTERIOR APPROACH;  Surgeon: Rod Can, MD;  Location: [place];  Service: Orthopedics;  Laterality: Right;   TUBAL LIGATION  30 years   VIDEO BRONCHOSCOPY  04/24/2012   Procedure: VIDEO BRONCHOSCOPY WITHOUT FLUORO;  Surgeon: Rigoberto Noel, MD;  Location: [place];  Service: Cardiopulmonary;  Laterality: Bilateral;    Prior to Admission medications     Medication Sig Start Date End Date Taking? Authorizing Provider  acetaminophen (TYLENOL) 500 MG tablet Take 500 mg by mouth every 4 (four) hours as needed for mild pain.    [provider]  alum & mag hydroxide-simeth ([place]) 200-200-20 MG/5ML suspension Take 30 mLs by mouth every 6 (six) hours as needed for indigestion or heartburn.    [provider]  aspirin EC 81 MG tablet Take 1 tablet (81 mg total) by mouth 2 (two) times daily. 07/03/18 07/03/19  Elodia Florence., MD  atorvastatin (LIPITOR) 20 MG tablet Take 1 tablet (20 mg total) by mouth daily at 6 PM. 02/29/16   Reyne Dumas, MD  b complex vitamins tablet Take 1 tablet by mouth daily. 01/25/16   Rolene Course, PA-C  clopidogrel (PLAVIX) 75 MG tablet Take 75 mg by mouth daily.    [provider]  gabapentin (NEURONTIN) 300 MG capsule Take 300 mg by mouth at bedtime. 06/12/16   [provider]  guaiFENesin (ROBITUSSIN) 100 MG/5ML SOLN Take 10 mLs by mouth every 6 (six) hours as needed for cough or to loosen phlegm.    [provider]  hyoscyamine (LEVSIN, ANASPAZ) 0.125 MG tablet Take 0.125 mg by mouth See admin instructions. Take 1 tablet by mouth every 2 hours as needed excessive secretions    [provider]  ipratropium-albuterol (DUONEB) 0.5-2.5 (3) MG/3ML SOLN Take 3 mLs by nebulization every 6 (six) hours as needed (wheezing/congestion).    [provider]  loperamide (IMODIUM) 2 MG capsule Take 2 mg by mouth as needed for diarrhea or loose stools.    [provider]  losartan (COZAAR) 100 MG tablet Take 100 mg by mouth daily. 06/13/16   [provider]  magnesium hydroxide (MILK OF MAGNESIA) 400 MG/5ML suspension Take 30 mLs by mouth at bedtime as needed for mild constipation.    [provider]  mirtazapine (REMERON) 30 MG tablet Take 30 mg by mouth at bedtime. 06/28/16   [provider]  OLANZapine (ZYPREXA) 7.5 MG tablet Take 7.5 mg  by mouth 2 (two) times daily.     [provider]  polyethylene glycol (MIRALAX / GLYCOLAX) packet Take 17 g by mouth daily. Patient taking differently: Take 17 g by mouth daily as needed for mild constipation.  04/08/18   Bonnielee Haff, MD  tamsulosin (FLOMAX) 0.4 MG CAPS capsule Take 1 capsule (0.4 mg total) by mouth daily after supper for 30 days. 07/03/18 08/02/18  Elodia Florence., MD  traZODone (DESYREL) 50 MG tablet Take 50 mg by mouth at bedtime as needed for sleep.    [provider]  trolamine salicylate (ASPERCREME) 10 % cream Apply 1 application topically 2 (two) times daily as needed for muscle pain.     [provider]  vitamin C (ASCORBIC ACID) 500 MG tablet Take 500 mg by mouth daily.    [provider]    Current Facility-Administered Medications  Medication Dose Route  Frequency Provider Last Rate Last Dose   0.9 %  sodium chloride infusion (Manually program via Guardrails IV Fluids)   Intravenous Once Bonnell Public, MD   Stopped at 07/10/18 0145   atorvastatin (LIPITOR) tablet 20 mg  20 mg Oral q1800 Dana Allan I, MD       gabapentin (NEURONTIN) capsule 300 mg  300 mg Oral QHS Dana Allan I, MD       ipratropium-albuterol (DUONEB) 0.5-2.5 (3) MG/3ML nebulizer solution 3 mL  3 mL Nebulization Q6H PRN Dana Allan I, MD       losartan (COZAAR) tablet 100 mg  100 mg Oral Daily Dana Allan I, MD       mirtazapine (REMERON) tablet 30 mg  30 mg Oral QHS Dana Allan I, MD       OLANZapine (ZYPREXA) tablet 7.5 mg  7.5 mg Oral BID Dana Allan I, MD       tamsulosin (FLOMAX) capsule 0.4 mg  0.4 mg Oral QPC supper Dana Allan I, MD       vitamin C (ASCORBIC ACID) tablet 500 mg  500 mg Oral Daily Dana Allan I, MD        Allergies as of 07/09/2018 - Review Complete 07/09/2018  Allergen Reaction Noted   Amoxapine and related Nausea And Vomiting 12/24/2012   Amoxicillin Itching  02/08/2013   Ciprocinonide [fluocinolone] Other (See Comments) 12/24/2012   Ciprofloxacin Other (See Comments) 02/08/2013   Doxycycline Nausea And Vomiting 12/24/2012   Erythromycin Hives 12/24/2012   Sulfa antibiotics Other (See Comments) 03/04/2012   Zofran Alvis Lemmings hcl] Nausea And Vomiting 12/24/2012    Family History  Problem Relation Age of Onset   Breast cancer Mother    Breast cancer Sister     Social History   Socioeconomic History   Marital status: Single    Spouse name: Not on file   Number of children: Not on file   Years of education: Not on file   Highest education level: Not on file  Occupational History   Occupation: retired    Fish farm manager: RETIRED  Social Needs   Financial resource strain: Not on file   Food insecurity:    Worry: Not on file    Inability: Not on file   Transportation needs:    Medical: Not on file    Non-medical: Not on file  Tobacco Use   Smoking status: Former Smoker    Packs/day: 0.25    Years: 30.00    Pack years: 7.50    Types: Cigarettes   Smokeless tobacco: Never Used  Substance and Sexual Activity   Alcohol use: No   Drug use: No   Sexual activity: Not on file  Lifestyle   Physical activity:    Days per week: Not on file    Minutes per session: Not on file   Stress: Not on file  Relationships   Social connections:    Talks on phone: Not on file    Gets together: Not on file    Attends religious service: Not on file    Active member of club or organization: Not on file    Attends meetings of clubs or organizations: Not on file    Relationship status: Not on file   Intimate partner violence:    Fear of current or ex partner: Not on file    Emotionally abused: Not on file    Physically abused: Not on file    Forced sexual activity: Not on file  Other  Topics Concern   Not on file  Social History Narrative   Not on file    Review of Systems: All systems reviewed and negative except  where noted in HPI.  Physical Exam: Vital signs in last 24 hours: Temp:  [97.7 F (36.5 C)-98.5 F (36.9 C)] 97.7 F (36.5 C) (04/16 0610) Pulse Rate:  [31-119] 89 (04/16 0610) Resp:  [17-37] 22 (04/16 0610) BP: (103-157)/(3-90) 122/62 (04/16 0610) SpO2:  [91 %-100 %] 99 % (04/16 0610) Weight:  [45.6 kg-46.6 kg] 45.6 kg (04/16 0610)   General:   Alert, well-developed, female in NAD Psych:  cooperative. Normal mood and affect. Eyes:  Pupils equal, sclera clear, no icterus.   Conjunctiva pink. Ears:  Normal auditory acuity. Nose:  No deformity, discharge,  or lesions. Neck:  Supple; no masses Lungs:  Clear throughout to auscultation but decreased breath sounds at bases.   Heart:  Regular rate and rhythm, no lower extremity edema Abdomen:  Soft, non-distended, nontender, BS active Rectal:  Deferred  Msk:  Symmetrical without gross deformities. . Neurologic:  Alert ,  grossly normal neurologically. Skin:  Intact without significant lesions or rashes.   Intake/Output from previous day: 04/15 0701 - 04/16 0700 In: 800 [IV Piggyback:800] Out: 800 [Urine:800] Intake/Output this shift: No intake/output data recorded.  Lab Results: Recent Labs    07/09/18 1524 07/09/18 2222 07/10/18 0359 07/10/18 0958  WBC 25.0*  --  21.3*  --   HGB 5.9* 7.6* 7.0* 7.5*  HCT 20.6* 23.7* 20.9* 22.8*  PLT 720*  --  536*  --    BMET Recent Labs    07/09/18 1524 07/10/18 0359  NA 142 142  K 3.6 3.1*  CL 109 111  CO2 21* 20*  GLUCOSE 113* 90  BUN 25* 20  CREATININE 0.95 0.75  CALCIUM 8.4* 7.8*   LFT Recent Labs    07/09/18 1524  PROT 5.7*  ALBUMIN 2.6*  AST 32  ALT 23  ALKPHOS 122  BILITOT 0.9   PT/INR No results for input(s): LABPROT, INR in the last 72 hours. Hepatitis Panel No results for input(s): HEPBSAG, HCVAB, HEPAIGM, HEPBIGM in the last 72 hours.    Studies/Results: Ct Angio Chest Pe W And/or Wo Contrast  Result Date: 07/09/2018 CLINICAL DATA:  73 year old  female with shortness of breath EXAM: CT ANGIOGRAPHY CHEST WITH CONTRAST TECHNIQUE: Multidetector CT imaging of the chest was performed using the standard protocol during bolus administration of intravenous contrast. Multiplanar CT image reconstructions and MIPs were obtained to evaluate the vascular anatomy. CONTRAST:  43mL OMNIPAQUE IOHEXOL 350 MG/ML SOLN COMPARISON:  04/04/2018, 03/17/2012 FINDINGS: Cardiovascular: Heart: Borderline cardiomegaly. No pericardial fluid/thickening. Calcifications of left anterior descending, circumflex, right coronary artery. Aorta: Unremarkable course, caliber, contour of the thoracic aorta. No aneurysm or dissection flap. No periaortic fluid. Pulmonary arteries: There are a few central filling defects within the subsegmental vessels of right middle lobe, right lower lobe, left lower lobe. No associated lung changes/infarction. Mediastinum/Nodes: No mediastinal adenopathy. Unremarkable appearance of the thoracic esophagus. Unremarkable appearance of the thoracic inlet and thyroid. Lungs/Pleura: Advanced paraseptal and centrilobular emphysema. Platelike scarring at the bilateral lung apices, relatively unchanged dating to 03/17/2012. No confluent airspace disease, pneumothorax, or pleural effusion. No evidence of edema. No endotracheal or endobronchial debris or bronchial wall thickening. Upper Abdomen: No acute. Musculoskeletal: No acute displaced fracture. Partially healed fracture of the lower sternum. No bony canal narrowing. Changes of vertebral augmentation of L1. Review of the MIP images confirms the above  findings. IMPRESSION: CT is positive for a few bilateral subsegmental pulmonary emboli, with no evidence of lung infarction. These results were discussed by telephone at the time of interpretation on 07/09/2018 at 5:49 pm with Dr. Duffy Bruce. Advanced paraseptal and centrilobular emphysema. Emphysema (ICD10-J43.9). Aortic Atherosclerosis (ICD10-I70.0). Associated  coronary artery disease. Electronically Signed   By: Corrie Mckusick D.O.   On: 07/09/2018 17:52   Dg Chest Portable 1 View  Result Date: 07/09/2018 CLINICAL DATA:  Shortness of breath EXAM: PORTABLE CHEST 1 VIEW COMPARISON:  07/02/2018 FINDINGS: There is no focal consolidation. There is biapical scarring. There is no pneumothorax. There is a small right pleural effusion. The heart and mediastinal contours are unremarkable. There are multiple right posterolateral rib fractures involving the second, third, fourth, fifth, 6, seventh and eighth ribs. IMPRESSION: Trace right pleural effusion. Multiple right rib fractures again noted. Electronically Signed   By: Kathreen Devoid   On: 07/09/2018 15:52      Tye Savoy, NP-C @  07/10/2018, 12:15 PM

## 2018-07-10 NOTE — Progress Notes (Signed)
Lower extremity venous has been completed.   Preliminary results in CV Proc.   Abram Sander 07/10/2018 9:00 AM

## 2018-07-10 NOTE — Plan of Care (Signed)
  Problem: Clinical Measurements: Goal: Ability to maintain clinical measurements within normal limits will improve Outcome: Progressing   Problem: Clinical Measurements: Goal: Ability to maintain clinical measurements within normal limits will improve Outcome: Progressing Goal: Will remain free from infection Outcome: Progressing Goal: Diagnostic test results will improve Outcome: Progressing Goal: Respiratory complications will improve Outcome: Progressing Goal: Cardiovascular complication will be avoided Outcome: Progressing   

## 2018-07-11 LAB — CBC
HCT: 21.2 % — ABNORMAL LOW (ref 36.0–46.0)
Hemoglobin: 6.9 g/dL — CL (ref 12.0–15.0)
MCH: 30.3 pg (ref 26.0–34.0)
MCHC: 32.5 g/dL (ref 30.0–36.0)
MCV: 93 fL (ref 80.0–100.0)
Platelets: 524 10*3/uL — ABNORMAL HIGH (ref 150–400)
RBC: 2.28 MIL/uL — ABNORMAL LOW (ref 3.87–5.11)
RDW: 19.6 % — ABNORMAL HIGH (ref 11.5–15.5)
WBC: 18.3 10*3/uL — ABNORMAL HIGH (ref 4.0–10.5)
nRBC: 0.2 % (ref 0.0–0.2)

## 2018-07-11 LAB — MAGNESIUM: Magnesium: 2.2 mg/dL (ref 1.7–2.4)

## 2018-07-11 LAB — BASIC METABOLIC PANEL
Anion gap: 6 (ref 5–15)
BUN: 13 mg/dL (ref 8–23)
CO2: 26 mmol/L (ref 22–32)
Calcium: 7.9 mg/dL — ABNORMAL LOW (ref 8.9–10.3)
Chloride: 108 mmol/L (ref 98–111)
Creatinine, Ser: 0.59 mg/dL (ref 0.44–1.00)
GFR calc Af Amer: >60 mL/min (ref >60)
GFR calc non Af Amer: >60 mL/min (ref >60)
Glucose, Bld: 94 mg/dL (ref 70–99)
Potassium: 3.6 mmol/L (ref 3.5–5.1)
Sodium: 140 mmol/L (ref 135–145)

## 2018-07-11 LAB — PREPARE RBC (CROSSMATCH)

## 2018-07-11 LAB — HEPARIN LEVEL (UNFRACTIONATED): Heparin Unfractionated: 0.18 IU/mL — ABNORMAL LOW (ref 0.30–0.70)

## 2018-07-11 MED ORDER — APIXABAN 5 MG PO TABS
10.0000 mg | ORAL_TABLET | Freq: Two times a day (BID) | ORAL | Status: DC
  Administered 2018-07-11: 10 mg via ORAL
  Filled 2018-07-11: qty 2

## 2018-07-11 MED ORDER — APIXABAN 5 MG PO TABS: 5.0000 mg | ORAL_TABLET | Freq: Two times a day (BID) | ORAL | Status: DC

## 2018-07-11 MED ORDER — APIXABAN 5 MG PO TABS: ORAL_TABLET | ORAL | 0 refills | Status: DC

## 2018-07-11 MED ORDER — SODIUM CHLORIDE 0.9% IV SOLUTION: Freq: Once | INTRAVENOUS | Status: DC

## 2018-07-11 MED FILL — ELIQUIS STARTER PACK 5 MG T: 5 | 30 days supply | Qty: 74 | Fill #0

## 2018-07-11 NOTE — Progress Notes (Signed)
[facility] called for patient transport. Patient's daughter [name] notified.    [place], [place]

## 2018-07-11 NOTE — TOC Transition Note (Signed)
Transition of Care [facility]) - CM/SW Discharge Note   Patient Details  Name: [name] MRN: [id number] Date of Birth: 07/11/1945  [facility] of [facility]) CM/SW Contact:  [name], LCSW Phone Number: 07/11/2018, 10:02 AM   Clinical Narrative:     CSW consulted with patient daughter [name] regarding discharge plan, as patient is oriented only to self. [name] reports patient has been at [facility] and would like patient to return to [facility] for short term rehab when medically stable to discharge from the hospital. CSW confirmed with [facility] they are able to take patient back at discharge. CSW will continue to update family on when patient is discharged and when [name] is called for transport.     Barriers to Discharge: No Barriers Identified   Patient Goals and CMS Choice Patient states their goals for this hospitalization and ongoing recovery are:: daughter wants patient to return to [facility] CMS Medicare.gov Compare Post Acute Care list provided to:: Patient Represenative (must comment)(daughter [name]) Choice offered to / list presented to : Adult Children  Discharge Placement                       Discharge Plan and Services In-house Referral: Clinical Social Work, [facility](ED CM noted [facility] pending status contacted [facility] CM to inform of hospitalization ) Discharge Planning Services: NA Post Acute Care Choice: [place]          DME Arranged: N/A DME Agency: NA HH Arranged: NA HH Agency: NA   Social Determinants of Health (SDOH) Interventions     Readmission Risk Interventions Readmission Risk Prevention Plan 06/27/2018  Transportation Screening Complete  PCP or Specialist Appt within 3-5 Days Complete  HRI or [place] Complete  Social Work Consult for [place] Planning/Counseling Complete  Palliative Care Screening Complete  Medication Review Press photographer) Complete  Some recent data might be hidden

## 2018-07-11 NOTE — Care Management Important Message (Signed)
Important Message  Patient Details  Name: [name] MRN: [id number] Date of Birth: [birth year]   Medicare Important Message Given:  Yes    [name] [name] 07/11/2018, 3:55 PM

## 2018-07-11 NOTE — Discharge Instructions (Signed)
Information on my medicine - ELIQUIS (apixaban)  This medication education was reviewed with me or my healthcare representative as part of my discharge preparation.   Why was Eliquis prescribed for you? Eliquis was prescribed to treat blood clots that may have been found in the veins of your legs (deep vein thrombosis) or in your lungs (pulmonary embolism) and to reduce the risk of them occurring again.  What do You need to know about Eliquis ? The starting dose is 10 mg (two 5 mg tablets) taken TWICE daily for the FIRST SEVEN (7) DAYS, then on 07/18/18  the dose is reduced to ONE 5 mg tablet taken TWICE daily.  Eliquis may be taken with or without food.   Try to take the dose about the same time in the morning and in the evening. If you have difficulty swallowing the tablet whole please discuss with your pharmacist how to take the medication safely.  Take Eliquis exactly as prescribed and DO NOT stop taking Eliquis without talking to the doctor who prescribed the medication.  Stopping may increase your risk of developing a new blood clot.  Refill your prescription before you run out.  After discharge, you should have regular check-up appointments with your healthcare provider that is prescribing your Eliquis.    What do you do if you miss a dose? If a dose of ELIQUIS is not taken at the scheduled time, take it as soon as possible on the same day and twice-daily administration should be resumed. The dose should not be doubled to make up for a missed dose.  Important Safety Information A possible side effect of Eliquis is bleeding. You should call your healthcare provider right away if you experience any of the following: ? Bleeding from an injury or your nose that does not stop. ? Unusual colored urine (red or dark brown) or unusual colored stools (red or black). ? Unusual bruising for unknown reasons. ? A serious fall or if you hit your head (even if there is no bleeding).  Some  medicines may interact with Eliquis and might increase your risk of bleeding or clotting while on Eliquis. To help avoid this, consult your healthcare provider or pharmacist prior to using any new prescription or non-prescription medications, including herbals, vitamins, non-steroidal anti-inflammatory drugs (NSAIDs) and supplements.  This website has more information on Eliquis (apixaban): http://www.eliquis.com/eliquis/home

## 2018-07-11 NOTE — Discharge Summary (Signed)
Physician Discharge Summary  [name] DGL:[id number] DOB: [birth year] DOA: 07/09/2018  PCP: Jeanette Caprice, PA-C  Admit date: 07/09/2018 Discharge date: 07/11/2018  Admitted From: [facility], on hospice Disposition: [facility], on hospice  Recommendations for Outpatient Follow-up:  1. Follow up with PCP in 1-2 weeks 2. Please obtain BMP/CBC in one week your next doctors visit.  3. Eliquis 10 mg twice daily for 7 days followed by 5 mg twice daily.   Discharge Condition: Stable CODE STATUS: DNR. Diet recommendation: As tolerated  Brief/Interim Summary: 73 year old with history of COPD, depression, GERD, hypertension, vascular dementia who is under hospice care at [facility] home came to the hospital after sustaining a fall earlier in April with closed head injury/hematoma, right-sided rib fractures and hip fracture.  Underwent right total hip arthroplasty and eventually was discharged on aspirin 81 mg twice daily for DVT prophylaxis.  Now presents with symptomatic anemia/shortness of breath found to have hemoglobin less than 6 with bilateral pulmonary embolism.  Gastroenterology consulted. After extensive discussion with gastroenterology and the patient's daughter it was determined to hold off on any procedural intervention and as patient is not having any active major GI bleed it would be beneficial to proceed with anticoagulation.  This can be stopped if she develops any bleeding in the future. Stable for discharge today.   Discharge Diagnoses:  Active Problems:   Symptomatic anemia  Exertional shortness of breath, multifactorial Acute blood loss anemia on chronic disease Bilateral subsegmental pulmonary embolism, no cor pulmonale - Patient is Hemoccult positive.  Gastroenterology consulted.    After extensive discussion with the patient's daughter it was determined not to proceed with any further procedural intervention and go ahead and anticoagulate her as  long as she does not have any active bleeding and hemoglobin remained stable.  If this happens in the future, anticoagulation will be discontinued.  Extensively discussed Coumadin versus novel oral anticoagulation options with the daughter.  She chooses Eliquis therefore it was started.  Eliquis 10 mg twice daily for 7 days followed by 5 mg twice daily.  Patient will remain on hospice at the existing living facility. -Lower extremity Doppler is negative for DVT. -Status post transfusion at the time of admission.  This morning hemoglobin 6.9 therefore will receive 1 more unit prior to discharge if patient is agreeable to this.  Recent fall with right hip fracture requiring total hip arthroplasty - Postop routine care.  Needs rehab.  Vascular dementia without behavioral disturbances -Supportive care  Leukocytosis -Likely reactive  COPD with emphysema - Stable.  Bronchodilators PRN.  Goals of care discussion-daughter confirms that patient is on hospice and does not want any aggressive medical treatment.  No procedures as well.  Patient remains DNR/DNI.  Daughter will think about it and report back to [place] if she would like to proceed with anticoagulation or not in the setting of DVT and GI bleed  Consultations:  Gastroenterology  Subjective: No overnight bleeding.  Hemoglobin 6.9 this morning but patient refusing transfusion.  I had an extensive discussion with her and the daughter and eventually patient has agreed to allow blood transfusion of 1 unit.  Discharge Exam: Vitals:   07/11/18 0648 07/11/18 0953  BP:    Pulse: 89   Resp: 19   Temp:  (!) 97.5 F (36.4 C)  SpO2: 97%    Vitals:   07/10/18 2000 07/10/18 2104 07/11/18 0648 07/11/18 0953  BP:  (!) 118/98    Pulse: 85 93 89   Resp: Marland Kitchen)  22 (!) 24 19   Temp:  98 F (36.7 C)  (!) 97.5 F (36.4 C)  TempSrc:  Oral  Oral  SpO2: 93% 98% 97%   Weight:      Height:        General: Pt is alert, awake, not in acute distress,  elderly frail-appearing.  Poor dentition. Cardiovascular: RRR, S1/S2 +, no rubs, no gallops Respiratory: CTA bilaterally, no wheezing, no rhonchi Abdominal: Soft, NT, ND, bowel sounds + Extremities: no edema, no cyanosis  Discharge Instructions   Allergies as of 07/11/2018      Reactions   Amoxapine And Related Nausea And Vomiting   Amoxicillin Itching   Itching Details unknown, info from [facility]   Ciprocinonide [fluocinolone] Other (See Comments)   Pt unsure   Ciprofloxacin Other (See Comments)   Pt unsure   Doxycycline Nausea And Vomiting   Erythromycin Hives   Sulfa Antibiotics Other (See Comments)   Unknown-reaction as a child   Zofran [ondansetron Hcl] Nausea And Vomiting      Medication List    TAKE these medications   acetaminophen 325 MG tablet Commonly known as:  TYLENOL Take 325 mg by mouth 2 (two) times daily.   acetaminophen 500 MG tablet Commonly known as:  TYLENOL Take 500 mg by mouth every 4 (four) hours as needed for mild pain.   apixaban 5 MG Tabs tablet Commonly known as:  ELIQUIS Take 2 tablets (10 mg total) by mouth 2 (two) times daily for 7 days, THEN 1 tablet (5 mg total) 2 (two) times daily for 23 days. Start taking on:  July 11, 2018   aspirin EC 81 MG tablet Take 1 tablet (81 mg total) by mouth 2 (two) times daily.   atorvastatin 20 MG tablet Commonly known as:  LIPITOR Take 1 tablet (20 mg total) by mouth daily at 6 PM.   b complex vitamins tablet Take 1 tablet by mouth daily.   clopidogrel 75 MG tablet Commonly known as:  PLAVIX Take 75 mg by mouth daily.   gabapentin 300 MG capsule Commonly known as:  NEURONTIN Take 300 mg by mouth at bedtime.   guaiFENesin 100 MG/5ML Soln Commonly known as:  ROBITUSSIN Take 10 mLs by mouth every 6 (six) hours as needed for cough or to loosen phlegm.   hyoscyamine 0.125 MG tablet Commonly known as:  LEVSIN Take 0.125 mg by mouth See admin instructions. Take 1 tablet by mouth every 2 hours as  needed excessive secretions   ipratropium-albuterol 0.5-2.5 (3) MG/3ML Soln Commonly known as:  DUONEB Take 3 mLs by nebulization every 6 (six) hours. For 7 days   loperamide 2 MG capsule Commonly known as:  IMODIUM Take 2 mg by mouth as needed for diarrhea or loose stools.   LORazepam 0.5 MG tablet Commonly known as:  ATIVAN Take 0.5 mg by mouth every 4 (four) hours as needed for anxiety.   losartan 100 MG tablet Commonly known as:  COZAAR Take 100 mg by mouth daily.   magnesium hydroxide 400 MG/5ML suspension Commonly known as:  MILK OF MAGNESIA Take 30 mLs by mouth at bedtime as needed for mild constipation.   metoprolol tartrate 25 MG tablet Commonly known as:  LOPRESSOR Take 25 mg by mouth 2 (two) times daily.   Mintox 568-616-83 MG/5ML suspension Generic drug:  alum & mag hydroxide-simeth Take 30 mLs by mouth every 6 (six) hours as needed for indigestion or heartburn.   mirtazapine 30 MG tablet Commonly known as:  REMERON Take 30 mg by mouth at bedtime.   OLANZapine 7.5 MG tablet Commonly known as:  ZYPREXA Take 7.5 mg by mouth 2 (two) times daily.   polyethylene glycol 17 g packet Commonly known as:  MIRALAX / GLYCOLAX Take 17 g by mouth daily.   tamsulosin 0.4 MG Caps capsule Commonly known as:  FLOMAX Take 1 capsule (0.4 mg total) by mouth daily after supper for 30 days.   traZODone 50 MG tablet Commonly known as:  DESYREL Take 50 mg by mouth at bedtime as needed for sleep.   trolamine salicylate 10 % cream Commonly known as:  ASPERCREME Apply 1 application topically 2 (two) times daily as needed for muscle pain.   vitamin C 500 MG tablet Commonly known as:  ASCORBIC ACID Take 500 mg by mouth daily.      Contact information for after-discharge care    Destination    [facility] Preferred SNF .   Service:  Skilled Nursing Contact information: [address] [address] [phone]              Allergies  Allergen Reactions  . Amoxapine And Related Nausea And Vomiting  . Amoxicillin Itching    Itching Details unknown, info from [facility]  . Ciprocinonide [Fluocinolone] Other (See Comments)    Pt unsure  . Ciprofloxacin Other (See Comments)    Pt unsure  . Doxycycline Nausea And Vomiting  . Erythromycin Hives  . Sulfa Antibiotics Other (See Comments)    Unknown-reaction as a child  . Zofran [Ondansetron Hcl] Nausea And Vomiting    You were cared for by a hospitalist during your hospital stay. If you have any questions about your discharge medications or the care you received while you were in the hospital after you are discharged, you can call the unit and asked to speak with the hospitalist on call if the hospitalist that took care of you is not available. Once you are discharged, your primary care physician will handle any further medical issues. Please note that no refills for any discharge medications will be authorized once you are discharged, as it is imperative that you return to your primary care physician (or establish a relationship with a primary care physician if you do not have one) for your aftercare needs so that they can reassess your need for medications and monitor your lab values.   Procedures/Studies: Dg Chest 1 View  Result Date: 06/26/2018 CLINICAL DATA:  Fall. EXAM: CHEST  1 VIEW COMPARISON:  04/03/2018 FINDINGS: The heart size and mediastinal contours are within normal limits. There is no evidence of pulmonary edema, consolidation, pneumothorax, nodule or pleural fluid. There appear to be new fractures of the right third, fourth, fifth and sixth ribs demonstrating minimal angulation/displacement. IMPRESSION: New fractures of the right third, fourth, fifth and sixth ribs demonstrating minimal angulation and displacement. No evidence of associated pneumothorax or pleural fluid. Electronically Signed   By: Aletta Edouard M.D.   On: 06/26/2018 16:57   Dg Pelvis 1-2  Views  Result Date: 06/26/2018 CLINICAL DATA:  PER EMS, Pt lives ay [place] and fell in the bathroom and hit the back of her head. Pt also has lac to back of head . Pt reports pain to RT hip . EXAM: PELVIS - 1-2 VIEW COMPARISON:  CT, 09/19/2009. FINDINGS: Fracture of the right femoral neck, subcapital to mid femoral neck, nondisplaced and non comminuted, but with valgus angulation. No other fractures. No bone lesions. Hip joints,  SI joints and symphysis pubis are normally spaced and aligned. Skeletal structures are demineralized. Soft tissues are unremarkable. IMPRESSION: 1. Nondisplaced, non comminuted, valgus angulated fracture of the right femoral neck. 2. No other fractures.  No dislocation. Electronically Signed   By: Lajean Manes M.D.   On: 06/26/2018 14:55   Ct Head Wo Contrast  Result Date: 06/26/2018 CLINICAL DATA:  Head trauma, headache. Neck pain. EXAM: CT HEAD WITHOUT CONTRAST CT CERVICAL SPINE WITHOUT CONTRAST TECHNIQUE: Multidetector CT imaging of the head and cervical spine was performed following the standard protocol without intravenous contrast. Multiplanar CT image reconstructions of the cervical spine were also generated. COMPARISON:  04/02/2018 FINDINGS: CT HEAD FINDINGS Brain: No evidence of acute infarction, hemorrhage, hydrocephalus, extra-axial collection or mass lesion/mass effect. Generalized atrophy. Hypoattenuation of white matter, likely chronic microvascular ischemic change. Chronic LEFT parietal infarct, significant volume loss. Vascular: Calcification of the cavernous internal carotid arteries consistent with cerebrovascular atherosclerotic disease. No signs of intracranial large vessel occlusion. Skull: Calvarium intact. Sinuses/Orbits: No layering sinus fluid. Negative orbits. Other: Large RIGHT parietal scalp hematoma. CT CERVICAL SPINE FINDINGS Alignment: Normal. Skull base and vertebrae: No acute fracture. No primary bone lesion or focal pathologic process. Soft  tissues and spinal canal: No prevertebral fluid or swelling. No visible canal hematoma. BILATERAL carotid bifurcation calcification. Disc levels:  No traumatic or calcified disc herniation. Upper chest: Biapical scarring and emphysematous change, greater on the RIGHT, without progression from January 2020 CT chest. Other: None. IMPRESSION: 1. No skull fracture or intracranial hemorrhage. Large RIGHT parietal scalp hematoma. 2. No cervical spine fracture or traumatic subluxation. Electronically Signed   By: Staci Righter M.D.   On: 06/26/2018 13:50   Ct Angio Chest Pe W And/or Wo Contrast  Result Date: 07/09/2018 CLINICAL DATA:  73 year old female with shortness of breath EXAM: CT ANGIOGRAPHY CHEST WITH CONTRAST TECHNIQUE: Multidetector CT imaging of the chest was performed using the standard protocol during bolus administration of intravenous contrast. Multiplanar CT image reconstructions and MIPs were obtained to evaluate the vascular anatomy. CONTRAST:  64mL OMNIPAQUE IOHEXOL 350 MG/ML SOLN COMPARISON:  04/04/2018, 03/17/2012 FINDINGS: Cardiovascular: Heart: Borderline cardiomegaly. No pericardial fluid/thickening. Calcifications of left anterior descending, circumflex, right coronary artery. Aorta: Unremarkable course, caliber, contour of the thoracic aorta. No aneurysm or dissection flap. No periaortic fluid. Pulmonary arteries: There are a few central filling defects within the subsegmental vessels of right middle lobe, right lower lobe, left lower lobe. No associated lung changes/infarction. Mediastinum/Nodes: No mediastinal adenopathy. Unremarkable appearance of the thoracic esophagus. Unremarkable appearance of the thoracic inlet and thyroid. Lungs/Pleura: Advanced paraseptal and centrilobular emphysema. Platelike scarring at the bilateral lung apices, relatively unchanged dating to 03/17/2012. No confluent airspace disease, pneumothorax, or pleural effusion. No evidence of edema. No endotracheal or  endobronchial debris or bronchial wall thickening. Upper Abdomen: No acute. Musculoskeletal: No acute displaced fracture. Partially healed fracture of the lower sternum. No bony canal narrowing. Changes of vertebral augmentation of L1. Review of the MIP images confirms the above findings. IMPRESSION: CT is positive for a few bilateral subsegmental pulmonary emboli, with no evidence of lung infarction. These results were discussed by telephone at the time of interpretation on 07/09/2018 at 5:49 pm with Dr. Duffy Bruce. Advanced paraseptal and centrilobular emphysema. Emphysema (ICD10-J43.9). Aortic Atherosclerosis (ICD10-I70.0). Associated coronary artery disease. Electronically Signed   By: Corrie Mckusick D.O.   On: 07/09/2018 17:52   Ct Cervical Spine Wo Contrast  Result Date: 06/26/2018 CLINICAL DATA:  Head trauma, headache. Neck pain.  EXAM: CT HEAD WITHOUT CONTRAST CT CERVICAL SPINE WITHOUT CONTRAST TECHNIQUE: Multidetector CT imaging of the head and cervical spine was performed following the standard protocol without intravenous contrast. Multiplanar CT image reconstructions of the cervical spine were also generated. COMPARISON:  04/02/2018 FINDINGS: CT HEAD FINDINGS Brain: No evidence of acute infarction, hemorrhage, hydrocephalus, extra-axial collection or mass lesion/mass effect. Generalized atrophy. Hypoattenuation of white matter, likely chronic microvascular ischemic change. Chronic LEFT parietal infarct, significant volume loss. Vascular: Calcification of the cavernous internal carotid arteries consistent with cerebrovascular atherosclerotic disease. No signs of intracranial large vessel occlusion. Skull: Calvarium intact. Sinuses/Orbits: No layering sinus fluid. Negative orbits. Other: Large RIGHT parietal scalp hematoma. CT CERVICAL SPINE FINDINGS Alignment: Normal. Skull base and vertebrae: No acute fracture. No primary bone lesion or focal pathologic process. Soft tissues and spinal canal: No  prevertebral fluid or swelling. No visible canal hematoma. BILATERAL carotid bifurcation calcification. Disc levels:  No traumatic or calcified disc herniation. Upper chest: Biapical scarring and emphysematous change, greater on the RIGHT, without progression from January 2020 CT chest. Other: None. IMPRESSION: 1. No skull fracture or intracranial hemorrhage. Large RIGHT parietal scalp hematoma. 2. No cervical spine fracture or traumatic subluxation. Electronically Signed   By: Staci Righter M.D.   On: 06/26/2018 13:50   Pelvis Portable  Result Date: 06/27/2018 CLINICAL DATA:  73 year old female with a history of right femoral neck fracture EXAM: PORTABLE PELVIS 1-2 VIEWS COMPARISON:  Preoperative radiographs 06/26/2018 FINDINGS: A single frontal view of the pelvis demonstrates interval surgical changes of right total hip arthroplasty. A cerclage wire encircles the intertrochanteric region of the femur. Aside from the 2 screws transfixing the acetabular cup, there is a third metallic fragment measuring 2.1 cm in length which resembles a drill bit. Expected subcutaneous emphysema in the soft tissues in the region of incision. IMPRESSION: Surgical changes of right total hip arthroplasty as above. Please see operative note for further detail. Electronically Signed   By: Jacqulynn Cadet M.D.   On: 06/27/2018 13:41   Dg Chest Portable 1 View  Result Date: 07/09/2018 CLINICAL DATA:  Shortness of breath EXAM: PORTABLE CHEST 1 VIEW COMPARISON:  07/02/2018 FINDINGS: There is no focal consolidation. There is biapical scarring. There is no pneumothorax. There is a small right pleural effusion. The heart and mediastinal contours are unremarkable. There are multiple right posterolateral rib fractures involving the second, third, fourth, fifth, 6, seventh and eighth ribs. IMPRESSION: Trace right pleural effusion. Multiple right rib fractures again noted. Electronically Signed   By: Kathreen Devoid   On: 07/09/2018 15:52    Dg Chest Port 1 View  Result Date: 07/02/2018 CLINICAL DATA:  Hypoxia. Right rib fractures. EXAM: PORTABLE CHEST 1 VIEW COMPARISON:  Chest radiograph 06/26/2018 FINDINGS: Increasing hazy opacity at the right lung base from prior exam. Overall lower lung volumes leading to bronchovascular crowding. No visualized pneumothorax. Unchanged heart size and mediastinal contours. Right rib fractures again seen. Bones are under mineralized. IMPRESSION: 1. Increasing hazy opacity at the right lung base from prior exam. This may be atelectasis, pleural fluid, pulmonary contusion or combination thereof given recent right rib fractures. No visualized pneumothorax. 2. Lower lung volumes from prior exam with bronchovascular crowding. Electronically Signed   By: Keith Rake M.D.   On: 07/02/2018 20:33   Dg Knee Right Port  Result Date: 06/26/2018 CLINICAL DATA:  Right knee pain after fall. EXAM: PORTABLE RIGHT KNEE - 1-2 VIEW COMPARISON:  None. FINDINGS: No evidence of fracture, dislocation, or joint effusion.  No evidence of arthropathy or other focal bone abnormality. Vascular calcifications are noted. IMPRESSION: Negative. Electronically Signed   By: Marijo Conception, M.D.   On: 06/26/2018 16:50   Dg C-arm 1-60 Min  Result Date: 06/27/2018 CLINICAL DATA:  73 year old female with right hip fracture EXAM: DG C-ARM 61-120 MIN; OPERATIVE LEFT HIP WITH PELVIS COMPARISON:  06/26/2010 FINDINGS: Limited intraoperative fluoroscopic spot images during right hip arthroplasty. IMPRESSION: Intraoperative fluoroscopic spot images for right hip arthroplasty. Please refer to the dictated operative report for full details of intraoperative findings and procedure. Electronically Signed   By: Corrie Mckusick D.O.   On: 06/27/2018 12:15   Dg Hip Operative Unilat W Or W/o Pelvis Left  Result Date: 06/27/2018 CLINICAL DATA:  73 year old female with right hip fracture EXAM: DG C-ARM 61-120 MIN; OPERATIVE LEFT HIP WITH PELVIS COMPARISON:   06/26/2010 FINDINGS: Limited intraoperative fluoroscopic spot images during right hip arthroplasty. IMPRESSION: Intraoperative fluoroscopic spot images for right hip arthroplasty. Please refer to the dictated operative report for full details of intraoperative findings and procedure. Electronically Signed   By: Corrie Mckusick D.O.   On: 06/27/2018 12:15   Dg Femur, Min 2 Views Right  Result Date: 06/26/2018 CLINICAL DATA:  PER EMS, Pt lives ay [place] and fell in the bathroom and hit the back of her head. Pt also has lac to back of head . Pt reports pain to RT hip . EXAM: RIGHT FEMUR 2 VIEWS COMPARISON:  None. FINDINGS: Non comminuted fracture of the right femoral neck. No significant fracture displacement. There is valgus fracture angulation. No other fractures.  Hip and knee joints are normally aligned. Soft tissues are unremarkable. IMPRESSION: 1. Nondisplaced, non comminuted, valgus angulated fracture of the right femoral neck. 2. No other fractures.  No dislocation. Electronically Signed   By: Lajean Manes M.D.   On: 06/26/2018 14:54   Vas [place] Lower Extremity Venous (dvt)  Result Date: 07/10/2018  Lower Venous Study Indications: Pulmonary embolism.  Performing Technologist: Abram Sander RVS  Examination Guidelines: A complete evaluation includes B-mode imaging, spectral Doppler, color Doppler, and power Doppler as needed of all accessible portions of each vessel. Bilateral testing is considered an integral part of a complete examination. Limited examinations for reoccurring indications may be performed as noted.  +---------+---------------+---------+-----------+----------+--------------+ RIGHT    CompressibilityPhasicitySpontaneityPropertiesSummary        +---------+---------------+---------+-----------+----------+--------------+ CFV      Full           Yes      Yes                                 +---------+---------------+---------+-----------+----------+--------------+ SFJ       Full                                                        +---------+---------------+---------+-----------+----------+--------------+ FV Prox  Full                                                        +---------+---------------+---------+-----------+----------+--------------+ FV Mid   Full                                                        +---------+---------------+---------+-----------+----------+--------------+  FV DistalFull                                                        +---------+---------------+---------+-----------+----------+--------------+ PFV      Full                                                        +---------+---------------+---------+-----------+----------+--------------+ POP      Full           Yes      Yes                                 +---------+---------------+---------+-----------+----------+--------------+ PTV      Full                                                        +---------+---------------+---------+-----------+----------+--------------+ PERO                                                  Not visualized +---------+---------------+---------+-----------+----------+--------------+   +---------+---------------+---------+-----------+----------+--------------+ LEFT     CompressibilityPhasicitySpontaneityPropertiesSummary        +---------+---------------+---------+-----------+----------+--------------+ CFV      Full           Yes      Yes                                 +---------+---------------+---------+-----------+----------+--------------+ SFJ      Full                                                        +---------+---------------+---------+-----------+----------+--------------+ FV Prox  Full                                                        +---------+---------------+---------+-----------+----------+--------------+ FV Mid   Full                                                         +---------+---------------+---------+-----------+----------+--------------+ FV DistalFull                                                        +---------+---------------+---------+-----------+----------+--------------+  PFV      Full                                                        +---------+---------------+---------+-----------+----------+--------------+ POP      Full           Yes      Yes                                 +---------+---------------+---------+-----------+----------+--------------+ PTV                                                   Not visualized +---------+---------------+---------+-----------+----------+--------------+ PERO                                                  Not visualized +---------+---------------+---------+-----------+----------+--------------+     Summary: Right: There is no evidence of deep vein thrombosis in the lower extremity. However, portions of this examination were limited- see technologist comments above. No cystic structure found in the popliteal fossa. Left: There is no evidence of deep vein thrombosis in the lower extremity. However, portions of this examination were limited- see technologist comments above. No cystic structure found in the popliteal fossa.  *See table(s) above for measurements and observations. Electronically signed by Monica Martinez MD on 07/10/2018 at 4:19:41 PM.    Final       The results of significant diagnostics from this hospitalization (including imaging, microbiology, ancillary and laboratory) are listed below for reference.     Microbiology: Recent Results (from the past 240 hour(s))  MRSA PCR Screening     Status: None   Collection Time: 07/09/18  4:17 PM  Result Value Ref Range Status   MRSA by PCR NEGATIVE NEGATIVE Final    Comment:        The GeneXpert MRSA Assay (FDA approved for NASAL specimens only), is one component of a comprehensive MRSA  colonization surveillance program. It is not intended to diagnose MRSA infection nor to guide or monitor treatment for MRSA infections. Performed at [facility], [place] [address] [place], [place] [address]   Blood culture (routine x 2)     Status: None (Preliminary result)   Collection Time: 07/09/18  4:36 PM  Result Value Ref Range Status   Specimen Description BLOOD RIGHT ANTECUBITAL  Final   Special Requests   Final    BOTTLES DRAWN AEROBIC AND ANAEROBIC Blood Culture adequate volume   Culture   Final    NO GROWTH 2 DAYS Performed at [facility], [place] [address] [place], [place] [address]    Report Status PENDING  Incomplete  Blood culture (routine x 2)     Status: None (Preliminary result)   Collection Time: 07/09/18  4:38 PM  Result Value Ref Range Status   Specimen Description BLOOD LEFT HAND  Final   Special Requests   Final    BOTTLES DRAWN AEROBIC ONLY Blood Culture results may not be optimal due  to an inadequate volume of blood received in culture bottles   Culture   Final    NO GROWTH 2 DAYS Performed at [facility], [place] [address], [place], [place] [address]    Report Status PENDING  Incomplete     Labs: BNP (last 3 results) Recent Labs    07/09/18 1524  BNP 578.4*   Basic Metabolic Panel: Recent Labs  Lab 07/09/18 1524 07/10/18 0359 07/11/18 0423  NA 142 142 140  K 3.6 3.1* 3.6  CL 109 111 108  CO2 21* 20* 26  GLUCOSE 113* 90 94  BUN 25* 20 13  CREATININE 0.95 0.75 0.59  CALCIUM 8.4* 7.8* 7.9*  MG  --   --  2.2   Liver Function Tests: Recent Labs  Lab 07/09/18 1524  AST 32  ALT 23  ALKPHOS 122  BILITOT 0.9  PROT 5.7*  ALBUMIN 2.6*   No results for input(s): LIPASE, AMYLASE in the last 168 hours. No results for input(s): AMMONIA in the last 168 hours. CBC: Recent Labs  Lab 07/09/18 1524 07/09/18 2222 07/10/18 0359 07/10/18 0958 07/10/18 1559 07/11/18 0423  WBC 25.0*  --  21.3*  --   --  18.3*  NEUTROABS  23.0*  --   --   --   --   --   HGB 5.9* 7.6* 7.0* 7.5* 7.2* 6.9*  HCT 20.6* 23.7* 20.9* 22.8* 21.5* 21.2*  MCV 102.5*  --  93.3  --   --  93.0  PLT 720*  --  536*  --   --  524*   Cardiac Enzymes: Recent Labs  Lab 07/09/18 1524  TROPONINI 0.03*   BNP: Invalid input(s): POCBNP CBG: No results for input(s): GLUCAP in the last 168 hours. D-Dimer Recent Labs    07/09/18 1524  DDIMER 9.49*   Hgb A1c No results for input(s): HGBA1C in the last 72 hours. Lipid Profile No results for input(s): CHOL, HDL, LDLCALC, TRIG, CHOLHDL, LDLDIRECT in the last 72 hours. Thyroid function studies No results for input(s): TSH, T4TOTAL, T3FREE, THYROIDAB in the last 72 hours.  Invalid input(s): FREET3 Anemia work up No results for input(s): VITAMINB12, FOLATE, FERRITIN, TIBC, IRON, RETICCTPCT in the last 72 hours. Urinalysis    Component Value Date/Time   COLORURINE YELLOW 06/27/2018 0139   APPEARANCEUR HAZY (A) 06/27/2018 0139   LABSPEC 1.013 06/27/2018 0139   PHURINE 5.0 06/27/2018 0139   GLUCOSEU NEGATIVE 06/27/2018 0139   HGBUR SMALL (A) 06/27/2018 0139   BILIRUBINUR NEGATIVE 06/27/2018 0139   KETONESUR 5 (A) 06/27/2018 0139   PROTEINUR NEGATIVE 06/27/2018 0139   UROBILINOGEN 0.2 10/26/2014 1503   NITRITE NEGATIVE 06/27/2018 0139   LEUKOCYTESUR TRACE (A) 06/27/2018 0139   Sepsis Labs Invalid input(s): PROCALCITONIN,  WBC,  LACTICIDVEN Microbiology Recent Results (from the past 240 hour(s))  MRSA PCR Screening     Status: None   Collection Time: 07/09/18  4:17 PM  Result Value Ref Range Status   MRSA by PCR NEGATIVE NEGATIVE Final    Comment:        The GeneXpert MRSA Assay (FDA approved for NASAL specimens only), is one component of a comprehensive MRSA colonization surveillance program. It is not intended to diagnose MRSA infection nor to guide or monitor treatment for MRSA infections. Performed at [facility], [place] [address], [place], [place] [address]    Blood culture (routine x 2)     Status: None (Preliminary result)   Collection Time: 07/09/18  4:36 PM  Result Value Ref Range Status   Specimen Description BLOOD RIGHT ANTECUBITAL  Final   Special Requests   Final    BOTTLES DRAWN AEROBIC AND ANAEROBIC Blood Culture adequate volume   Culture   Final    NO GROWTH 2 DAYS Performed at [facility], [address], [place], [place] [address]    Report Status PENDING  Incomplete  Blood culture (routine x 2)     Status: None (Preliminary result)   Collection Time: 07/09/18  4:38 PM  Result Value Ref Range Status   Specimen Description BLOOD LEFT HAND  Final   Special Requests   Final    BOTTLES DRAWN AEROBIC ONLY Blood Culture results may not be optimal due to an inadequate volume of blood received in culture bottles   Culture   Final    NO GROWTH 2 DAYS Performed at [facility], [place] [address], [place], [place] [address]    Report Status PENDING  Incomplete     Time coordinating discharge:  I have spent 35 minutes face to face with the patient and on the ward discussing the patients care, assessment, plan and disposition with other care givers. >50% of the time was devoted counseling the patient about the risks and benefits of treatment/Discharge disposition and coordinating care.   SIGNED:   Damita Lack, MD  [facility] 07/11/2018, 10:15 AM   If 7PM-7AM, please contact night-coverage [email or web address]

## 2018-07-11 NOTE — Progress Notes (Signed)
[place] for heparin Indication: pulmonary embolus  Allergies  Allergen Reactions  . Amoxapine And Related Nausea And Vomiting  . Amoxicillin Itching    Itching Details unknown, info from [facility]  . Ciprocinonide [Fluocinolone] Other (See Comments)    Pt unsure  . Ciprofloxacin Other (See Comments)    Pt unsure  . Doxycycline Nausea And Vomiting  . Erythromycin Hives  . Sulfa Antibiotics Other (See Comments)    Unknown-reaction as a child  . Zofran [Ondansetron Hcl] Nausea And Vomiting    Patient Measurements: Height: 5\' 2"  (157.5 cm)(per chart: pt unable to tell me) Weight: 100 lb 8.5 oz (45.6 kg) IBW/kg (Calculated) : 50.1 Heparin Dosing Weight: 45kg  Vital Signs: Temp: 98 F (36.7 C) (04/16 2104) Temp Source: Oral (04/16 2104) BP: 118/98 (04/16 2104) Pulse Rate: 93 (04/16 2104)  Labs: Recent Labs    07/09/18 1524  07/10/18 0359 07/10/18 0958 07/10/18 1559 07/11/18 0423  HGB 5.9*   < > 7.0* 7.5* 7.2* 6.9*  HCT 20.6*   < > 20.9* 22.8* 21.5* 21.2*  PLT 720*  --  536*  --   --  524*  HEPARINUNFRC  --   --   --   --   --  0.18*  CREATININE 0.95  --  0.75  --   --   --   TROPONINI 0.03*  --   --   --   --   --    < > = values in this interval not displayed.    Estimated Creatinine Clearance: 44.4 mL/min (by C-G formula based on SCr of 0.75 mg/dL).   Medical History: Past Medical History:  Diagnosis Date  . Carotid artery aneurysm ([place]) 03/04/2012  . COPD (chronic obstructive pulmonary disease) ([place])   . Depression   . DJD (degenerative joint disease)   . GERD (gastroesophageal reflux disease)   . Hypertension   . Stroke [facility])    was on Plavix - not now  . Vascular dementia [facility])    Assessment: 73 year old female with pulmonary embolus. Anemia noted on admission, hgb stable in 7s today. Family does not wish to have GI work up (FOBT +). New orders received to start IV heparin tonight and continue overnight with plans  to transition to DOAC if stable overnight. Given FOBT +/anemia will not bolus heparin.  Initial heparin level 0.18 units/ml  Hg down to 6.9 this am  Goal of Therapy:  Heparin level 0.3-0.7 units/ml Monitor platelets by anticoagulation protocol: Yes   Plan:  Increase heparin drip to 800 units/hr F/u change to NOAC  Thanks for allowing pharmacy to be a part of this patient's care.  Excell Seltzer, PharmD Clinical Pharmacist 07/11/2018 5:40 AM

## 2018-07-11 NOTE — Consult Note (Signed)
   [facility] CM Inpatient Consult   07/11/2018  [name] 1945-09-15 [id number]   Follow up:  Unplanned readmission  Patient returned to [facility] today for ongoing rehab.  Patient with [facility].  No immediate community follow up assessed.  Natividad Brood, RN BSN [facility] Liaison  [phone] business mobile phone Toll free office [phone]

## 2018-07-11 NOTE — Progress Notes (Signed)
ANTICOAGULATION CONSULT NOTE   Pharmacy Consult: transition to Eliquis  Indication: pulmonary embolus  Allergies  Allergen Reactions  . Amoxapine And Related Nausea And Vomiting  . Amoxicillin Itching    Itching Details unknown, info from [facility]  . Ciprocinonide [Fluocinolone] Other (See Comments)    Pt unsure  . Ciprofloxacin Other (See Comments)    Pt unsure  . Doxycycline Nausea And Vomiting  . Erythromycin Hives  . Sulfa Antibiotics Other (See Comments)    Unknown-reaction as a child  . Zofran [Ondansetron Hcl] Nausea And Vomiting    Patient Measurements: Height: 5\' 2"  (157.5 cm)(per chart: pt unable to tell me) Weight: (wouldn't let me remove pillows for bedweight) IBW/kg (Calculated) : 50.1 Heparin Dosing Weight: 45kg  Vital Signs: Temp: 97.5 F (36.4 C) (04/17 0953) Temp Source: Oral (04/17 0953) Pulse Rate: 89 (04/17 0648)  Labs: Recent Labs    07/09/18 1524  07/10/18 0359 07/10/18 0958 07/10/18 1559 07/11/18 0423  HGB 5.9*   < > 7.0* 7.5* 7.2* 6.9*  HCT 20.6*   < > 20.9* 22.8* 21.5* 21.2*  PLT 720*  --  536*  --   --  524*  HEPARINUNFRC  --   --   --   --   --  0.18*  CREATININE 0.95  --  0.75  --   --  0.59  TROPONINI 0.03*  --   --   --   --   --    < > = values in this interval not displayed.    Estimated Creatinine Clearance: 44.4 mL/min (by C-G formula based on SCr of 0.59 mg/dL).   Medical History: Past Medical History:  Diagnosis Date  . Carotid artery aneurysm ([place]) 03/04/2012  . COPD (chronic obstructive pulmonary disease) ([place])   . Depression   . DJD (degenerative joint disease)   . GERD (gastroesophageal reflux disease)   . Hypertension   . Stroke [facility])    was on Plavix - not now  . Vascular dementia [facility])    Assessment: 73 year old female with pulmonary embolus. Anemia noted on admission, hgb stable in 7s today. Family does not wish to have GI work up (FOBT +). New orders received last night to start IV heparin which was started.   This morning orders are to transition to  DOAC, Eliquis treament dosing for PE. Hgb 6.9.  Dr. Reesa Chew is agreeable to Eliquis treatment dose for PE ,  10 mg bid x 7 days then reduce to 5 mg BID.   Monitor platelets by anticoagulation protocol: Yes   Plan:  Discontinue IV heparin and give Eliquis 10 mg bid x 7 days then 5 mg BID. Monitor for s/sx of bleeding. MD plans to discharge back to SNF today   Thanks for allowing pharmacy to be a part of this patient's care.  Nicole Cella, RPh Clinical Pharmacist [phone] Please check AMION for all [place] phone numbers After 10:00 PM, call [place] [phone] 07/11/2018 9:58 AM

## 2018-07-11 NOTE — TOC Transition Note (Addendum)
Transition of Care [facility]) - CM/SW Discharge Note   Patient Details  Name: [name] MRN: [id number] Date of Birth: Jul 11, 1945  [facility] CM/SW Contact:  [name], [name] Phone Number: [phone] 07/11/2018, 11:17 AM   Clinical Narrative:     Patient will DC to: [place] date: 07/11/2018 Family notified: [name] Transport by: [name]  Per MD patient ready for DC to [facility] . RN, patient, patient's family, and facility notified of DC. Discharge Summary sent to facility. RN given number for report [phone] Room 118 . DC packet on chart. Ambulance transport requested for patient once nurse notified CSW to call for transport.  CSW signing off.  [place], [place]   Final next level of care: Skilled Nursing Facility Barriers to Discharge: No Barriers Identified   Patient Goals and CMS Choice Patient states their goals for this hospitalization and ongoing recovery are:: daughter wants patient to return to [facility] CMS Medicare.gov Compare Post Acute Care list provided to:: Patient Represenative (must comment)([name]) Choice offered to / list presented to : Adult Children([name] (daughter))  Discharge Placement PASRR number recieved: 07/11/18            Patient chooses bed at: [facility] Patient to be transferred to facility by: [place] Name of family member notified: [name] Patient and family notified of of transfer: 07/11/18  Discharge Plan and Services In-house Referral: Clinical Social Work, [facility](ED CM noted [facility] pending status contacted [facility] CM to inform of hospitalization ) Discharge Planning Services: NA Post Acute Care Choice: [place]          DME Arranged: N/A DME Agency: NA HH Arranged: NA [place] Agency: NA   Social Determinants of Health ([place]) Interventions     Readmission Risk Interventions Readmission Risk Prevention Plan 06/27/2018  Transportation Screening  Complete  PCP or Specialist Appt within 3-5 Days Complete  HRI or Elisabetta Mishra Complete  Social Work Consult for [place] Planning/Counseling Complete  Palliative Care Screening Complete  Medication Review [employer]) Complete  Some recent data might be hidden

## 2018-07-11 NOTE — NC FL2 (Signed)
[place] LEVEL OF CARE SCREENING TOOL     IDENTIFICATION  Patient Name: [name] Birthdate: 08-Mar-1946 Sex: female Admission Date (Current Location): 07/09/2018  [facility] and [place] Number:  Herbalist and Address:  The . [facility], [place] [address], [place], [place] [address]      Provider Number: [id number]  Attending Physician Name and Address:  Damita Lack, MD  Relative Name and Phone Number:  [name] (daughter) [phone]    Current Level of Care: Hospital Recommended Level of Care: [place] Prior Approval Number:    Date Approved/Denied:   PASRR Number: [id number]  Discharge Plan: SNF    Current Diagnoses: Patient Active Problem List   Diagnosis Date Noted  . Symptomatic anemia 07/09/2018  . Acute blood loss as cause of postoperative anemia 06/28/2018  . Displaced fracture of right femoral neck ([place]) 06/27/2018  . Multiple fractures of ribs, right side, initial encounter for closed fracture 06/26/2018  . Fracture of femoral neck, right, closed ([place]) 06/26/2018  . Hematoma of right parietal scalp 06/26/2018  . Leukocytosis 06/26/2018  . Inadequate pain control 06/26/2018  . Hip fracture ([place]) 06/26/2018  . AKI (acute kidney injury) ([place]) 04/04/2018  . Hypoxia 04/04/2018  . Probable sepsis 04/04/2018  . Vascular dementia without behavioral disturbance ([place]) 06/30/2016  . Disease ruled out after examination   . Aphasia 05/15/2016  . Atherosclerosis of left carotid artery   . Cerebral embolism with cerebral infarction 02/27/2016  . Acute encephalopathy 02/26/2016  . Osteoporosis 07/04/2015  . Impaired cognition 03/30/2014  . Vitamin D deficiency 08/18/2013  . Hyperlipidemia 08/18/2013  . Prediabetes 08/18/2013  . Hospice care patient 08/18/2013  . CKD (chronic kidney disease) stage 3, GFR 30-59 ml/min ([name]) 07/01/2013  . Anemia of chronic disease 07/01/2013  . Depression, major, in  remission ([place]) 06/30/2013  . Essential hypertension 02/09/2013  . COPD GOLD III 03/28/2012  . Vocal cord dysfunction 03/28/2012  . Carotid artery aneurysm ([place]) 03/04/2012  . Smoker 03/04/2012    Orientation RESPIRATION BLADDER Height & Weight     Self  O2(nasal cannula 2L/min) Incontinent, External catheter Weight: (wouldn't let me remove pillows for bedweight) Height:  5\' 2"  (157.5 cm)(per chart: pt unable to tell me)  BEHAVIORAL SYMPTOMS/MOOD NEUROLOGICAL BOWEL NUTRITION STATUS      Continent Diet(see discharge summary)  AMBULATORY STATUS COMMUNICATION OF NEEDS Skin   Extensive Assist Verbally Other (Comment), Skin abrasions(abrasion right head, blister right heel, ecchymosis posterior head, right hip closed surgical incision, open wound head laceration)                       Personal Care Assistance Level of Assistance  Bathing, Feeding, Dressing, Total care Bathing Assistance: Maximum assistance Feeding assistance: Maximum assistance Dressing Assistance: Maximum assistance Total Care Assistance: Maximum assistance   Functional Limitations Info  Sight, Hearing, Speech Sight Info: Adequate Hearing Info: Impaired Speech Info: Adequate    SPECIAL CARE FACTORS FREQUENCY  PT (By licensed PT), OT (By licensed OT)     PT Frequency: min 5x weekly OT Frequency: min 5x weekly            Contractures Contractures Info: Not present    Additional Factors Info  Code Status, Allergies Code Status Info: DNR Allergies Info: Amoxapine and related, Amoxicillin, Ciprocinonide (fluocinolone), Ciprofloxacin, Doxycycline, Erythromycin, Sulfa Antibiotics, Zofran (ondanestron Hcl)           Current Medications (07/11/2018):  This is  the current hospital active medication list Current Facility-Administered Medications  Medication Dose Route Frequency Provider Last Rate Last Dose  . 0.9 %  sodium chloride infusion (Manually program via Guardrails IV Fluids)   Intravenous Once  Bonnell Public, MD   Stopped at 07/10/18 0145  . 0.9 %  sodium chloride infusion (Manually program via Guardrails IV Fluids)   Intravenous Once Schorr, Rhetta Mura, NP      . apixaban (ELIQUIS) tablet 10 mg  10 mg Oral BID Amin, Jeanella Flattery, MD       Followed by  . [START ON 07/18/2018] apixaban (ELIQUIS) tablet 5 mg  5 mg Oral BID Amin, Ankit Chirag, MD      . atorvastatin (LIPITOR) tablet 20 mg  20 mg Oral q1800 Dana Allan I, MD   20 mg at 07/10/18 1901  . gabapentin (NEURONTIN) capsule 300 mg  300 mg Oral QHS Dana Allan I, MD      . ipratropium-albuterol (DUONEB) 0.5-2.5 (3) MG/3ML nebulizer solution 3 mL  3 mL Nebulization Q6H PRN Dana Allan I, MD      . losartan (COZAAR) tablet 100 mg  100 mg Oral Daily Dana Allan I, MD   100 mg at 07/10/18 1356  . mirtazapine (REMERON) tablet 30 mg  30 mg Oral QHS Dana Allan I, MD      . OLANZapine (ZYPREXA) tablet 7.5 mg  7.5 mg Oral BID Dana Allan I, MD   7.5 mg at 07/10/18 1357  . tamsulosin (FLOMAX) capsule 0.4 mg  0.4 mg Oral QPC supper Dana Allan I, MD   0.4 mg at 07/10/18 1903  . vitamin C (ASCORBIC ACID) tablet 500 mg  500 mg Oral Daily Dana Allan I, MD   500 mg at 07/10/18 1356     Discharge Medications: Please see discharge summary for a list of discharge medications.  Relevant Imaging Results:  Relevant Lab Results:   Additional Information SSN: [id number]  [name], LCSW

## 2018-07-12 LAB — TYPE AND SCREEN
ABO/RH(D): O POS
Antibody Screen: NEGATIVE
Unit division: 0
Unit division: 0

## 2018-07-12 LAB — BPAM RBC
Blood Product Expiration Date: 202004222359
Blood Product Expiration Date: 202004282359
ISSUE DATE / TIME: 202004151809
ISSUE DATE / TIME: 202004171013
Unit Type and Rh: 5100
Unit Type and Rh: 5100

## 2018-07-14 LAB — CULTURE, BLOOD (ROUTINE X 2)
Culture: NO GROWTH
Culture: NO GROWTH
Special Requests: ADEQUATE

## 2018-07-15 DIAGNOSIS — D649 Anemia, unspecified: Secondary | ICD-10-CM | POA: Diagnosis not present

## 2018-07-15 DIAGNOSIS — R1312 Dysphagia, oropharyngeal phase: Secondary | ICD-10-CM | POA: Diagnosis not present

## 2018-07-15 DIAGNOSIS — F0151 Vascular dementia with behavioral disturbance: Secondary | ICD-10-CM | POA: Diagnosis not present

## 2018-07-15 DIAGNOSIS — I10 Essential (primary) hypertension: Secondary | ICD-10-CM | POA: Diagnosis not present

## 2018-07-15 DIAGNOSIS — I2699 Other pulmonary embolism without acute cor pulmonale: Secondary | ICD-10-CM | POA: Diagnosis not present

## 2018-07-16 DIAGNOSIS — S72041D Displaced fracture of base of neck of right femur, subsequent encounter for closed fracture with routine healing: Secondary | ICD-10-CM | POA: Diagnosis not present

## 2018-07-16 DIAGNOSIS — F0150 Vascular dementia without behavioral disturbance: Secondary | ICD-10-CM | POA: Diagnosis not present

## 2018-07-16 DIAGNOSIS — D62 Acute posthemorrhagic anemia: Secondary | ICD-10-CM | POA: Diagnosis not present

## 2018-07-17 DIAGNOSIS — F332 Major depressive disorder, recurrent severe without psychotic features: Secondary | ICD-10-CM | POA: Diagnosis not present

## 2018-07-17 DIAGNOSIS — R451 Restlessness and agitation: Secondary | ICD-10-CM | POA: Diagnosis not present

## 2018-07-17 DIAGNOSIS — F419 Anxiety disorder, unspecified: Secondary | ICD-10-CM | POA: Diagnosis not present

## 2018-07-17 DIAGNOSIS — R600 Localized edema: Secondary | ICD-10-CM | POA: Diagnosis not present

## 2018-07-17 DIAGNOSIS — F0150 Vascular dementia without behavioral disturbance: Secondary | ICD-10-CM | POA: Diagnosis not present

## 2018-07-21 DIAGNOSIS — F0150 Vascular dementia without behavioral disturbance: Secondary | ICD-10-CM | POA: Diagnosis not present

## 2018-07-21 DIAGNOSIS — D649 Anemia, unspecified: Secondary | ICD-10-CM | POA: Diagnosis not present

## 2018-07-21 DIAGNOSIS — R630 Anorexia: Secondary | ICD-10-CM | POA: Diagnosis not present

## 2018-07-21 DIAGNOSIS — R627 Adult failure to thrive: Secondary | ICD-10-CM | POA: Diagnosis not present

## 2018-07-22 DIAGNOSIS — F419 Anxiety disorder, unspecified: Secondary | ICD-10-CM | POA: Diagnosis not present

## 2018-07-22 DIAGNOSIS — R630 Anorexia: Secondary | ICD-10-CM | POA: Diagnosis not present

## 2018-07-22 DIAGNOSIS — D649 Anemia, unspecified: Secondary | ICD-10-CM | POA: Diagnosis not present

## 2018-07-22 DIAGNOSIS — R451 Restlessness and agitation: Secondary | ICD-10-CM | POA: Diagnosis not present

## 2018-07-22 DIAGNOSIS — F0150 Vascular dementia without behavioral disturbance: Secondary | ICD-10-CM | POA: Diagnosis not present

## 2018-07-22 DIAGNOSIS — R627 Adult failure to thrive: Secondary | ICD-10-CM | POA: Diagnosis not present

## 2018-07-23 ENCOUNTER — Inpatient Hospital Stay (HOSPITAL_COMMUNITY)
Admission: EM | Admit: 2018-07-23 | Discharge: 2018-07-25 | DRG: 812 | Disposition: A | Discharge status: Hospice | Payer: Medicare Other | Source: Skilled Nursing Facility | Attending: Internal Medicine | Admitting: Internal Medicine

## 2018-07-23 ENCOUNTER — Other Ambulatory Visit: Payer: Self-pay

## 2018-07-23 ENCOUNTER — Encounter (HOSPITAL_COMMUNITY): Payer: Self-pay

## 2018-07-23 DIAGNOSIS — Z8673 Personal history of transient ischemic attack (TIA), and cerebral infarction without residual deficits: Secondary | ICD-10-CM

## 2018-07-23 DIAGNOSIS — F0150 Vascular dementia without behavioral disturbance: Secondary | ICD-10-CM | POA: Diagnosis present

## 2018-07-23 DIAGNOSIS — J449 Chronic obstructive pulmonary disease, unspecified: Secondary | ICD-10-CM | POA: Diagnosis present

## 2018-07-23 DIAGNOSIS — M2550 Pain in unspecified joint: Secondary | ICD-10-CM | POA: Diagnosis not present

## 2018-07-23 DIAGNOSIS — Z20828 Contact with and (suspected) exposure to other viral communicable diseases: Secondary | ICD-10-CM | POA: Diagnosis present

## 2018-07-23 DIAGNOSIS — Z96641 Presence of right artificial hip joint: Secondary | ICD-10-CM | POA: Diagnosis present

## 2018-07-23 DIAGNOSIS — N183 Chronic kidney disease, stage 3 unspecified: Secondary | ICD-10-CM | POA: Diagnosis present

## 2018-07-23 DIAGNOSIS — R627 Adult failure to thrive: Secondary | ICD-10-CM | POA: Diagnosis not present

## 2018-07-23 DIAGNOSIS — R5381 Other malaise: Secondary | ICD-10-CM | POA: Diagnosis not present

## 2018-07-23 DIAGNOSIS — K922 Gastrointestinal hemorrhage, unspecified: Secondary | ICD-10-CM | POA: Diagnosis present

## 2018-07-23 DIAGNOSIS — D500 Iron deficiency anemia secondary to blood loss (chronic): Principal | ICD-10-CM | POA: Diagnosis present

## 2018-07-23 DIAGNOSIS — Z515 Encounter for palliative care: Secondary | ICD-10-CM | POA: Diagnosis present

## 2018-07-23 DIAGNOSIS — Z66 Do not resuscitate: Secondary | ICD-10-CM | POA: Diagnosis present

## 2018-07-23 DIAGNOSIS — I2699 Other pulmonary embolism without acute cor pulmonale: Secondary | ICD-10-CM | POA: Diagnosis not present

## 2018-07-23 DIAGNOSIS — I10 Essential (primary) hypertension: Secondary | ICD-10-CM | POA: Diagnosis present

## 2018-07-23 DIAGNOSIS — R7889 Finding of other specified substances, not normally found in blood: Secondary | ICD-10-CM | POA: Diagnosis not present

## 2018-07-23 DIAGNOSIS — Z7401 Bed confinement status: Secondary | ICD-10-CM | POA: Diagnosis not present

## 2018-07-23 DIAGNOSIS — D62 Acute posthemorrhagic anemia: Secondary | ICD-10-CM | POA: Diagnosis not present

## 2018-07-23 DIAGNOSIS — Z86711 Personal history of pulmonary embolism: Secondary | ICD-10-CM | POA: Diagnosis not present

## 2018-07-23 DIAGNOSIS — D649 Anemia, unspecified: Secondary | ICD-10-CM | POA: Diagnosis present

## 2018-07-23 DIAGNOSIS — Z03818 Encounter for observation for suspected exposure to other biological agents ruled out: Secondary | ICD-10-CM | POA: Diagnosis not present

## 2018-07-23 DIAGNOSIS — R630 Anorexia: Secondary | ICD-10-CM | POA: Diagnosis not present

## 2018-07-23 DIAGNOSIS — L8990 Pressure ulcer of unspecified site, unspecified stage: Secondary | ICD-10-CM

## 2018-07-23 DIAGNOSIS — R404 Transient alteration of awareness: Secondary | ICD-10-CM | POA: Diagnosis not present

## 2018-07-23 LAB — CBC WITH DIFFERENTIAL/PLATELET
Abs Immature Granulocytes: 0.08 10*3/uL — ABNORMAL HIGH (ref 0.00–0.07)
Basophils Absolute: 0 10*3/uL (ref 0.0–0.1)
Basophils Relative: 0 %
Eosinophils Absolute: 0.2 10*3/uL (ref 0.0–0.5)
Eosinophils Relative: 2 %
HCT: 16.9 % — ABNORMAL LOW (ref 36.0–46.0)
Hemoglobin: 4.9 g/dL — CL (ref 12.0–15.0)
Immature Granulocytes: 1 %
Lymphocytes Relative: 16 %
Lymphs Abs: 1.6 10*3/uL (ref 0.7–4.0)
MCH: 30.2 pg (ref 26.0–34.0)
MCHC: 29 g/dL — ABNORMAL LOW (ref 30.0–36.0)
MCV: 104.3 fL — ABNORMAL HIGH (ref 80.0–100.0)
Monocytes Absolute: 0.8 10*3/uL (ref 0.1–1.0)
Monocytes Relative: 8 %
Neutro Abs: 6.9 10*3/uL (ref 1.7–7.7)
Neutrophils Relative %: 73 %
Platelets: 661 10*3/uL — ABNORMAL HIGH (ref 150–400)
RBC: 1.62 MIL/uL — ABNORMAL LOW (ref 3.87–5.11)
RDW: 18 % — ABNORMAL HIGH (ref 11.5–15.5)
WBC: 9.5 10*3/uL (ref 4.0–10.5)
nRBC: 0 % (ref 0.0–0.2)

## 2018-07-23 LAB — COMPREHENSIVE METABOLIC PANEL
ALT: 15 U/L (ref 0–44)
AST: 20 U/L (ref 15–41)
Albumin: 2.4 g/dL — ABNORMAL LOW (ref 3.5–5.0)
Alkaline Phosphatase: 155 U/L — ABNORMAL HIGH (ref 38–126)
Anion gap: 11 (ref 5–15)
BUN: 10 mg/dL (ref 8–23)
CO2: 23 mmol/L (ref 22–32)
Calcium: 8.2 mg/dL — ABNORMAL LOW (ref 8.9–10.3)
Chloride: 107 mmol/L (ref 98–111)
Creatinine, Ser: 0.67 mg/dL (ref 0.44–1.00)
GFR calc Af Amer: >60 mL/min (ref >60)
GFR calc non Af Amer: >60 mL/min (ref >60)
Glucose, Bld: 103 mg/dL — ABNORMAL HIGH (ref 70–99)
Potassium: 3.8 mmol/L (ref 3.5–5.1)
Sodium: 141 mmol/L (ref 135–145)
Total Bilirubin: 0.4 mg/dL (ref 0.3–1.2)
Total Protein: 5.1 g/dL — ABNORMAL LOW (ref 6.5–8.1)

## 2018-07-23 LAB — PREPARE RBC (CROSSMATCH)

## 2018-07-23 LAB — SARS CORONAVIRUS 2 BY RT PCR (HOSPITAL ORDER, PERFORMED IN ~~LOC~~ HOSPITAL LAB): SARS Coronavirus 2: NEGATIVE

## 2018-07-23 LAB — LIPASE, BLOOD: Lipase: 33 U/L (ref 11–51)

## 2018-07-23 MED ORDER — METOPROLOL TARTRATE 25 MG PO TABS
25.0000 mg | ORAL_TABLET | Freq: Two times a day (BID) | ORAL | Status: DC
  Administered 2018-07-23 – 2018-07-25 (×4): 25 mg via ORAL
  Filled 2018-07-23 (×4): qty 1

## 2018-07-23 MED ORDER — ACETAMINOPHEN 325 MG PO TABS
650.0000 mg | ORAL_TABLET | Freq: Four times a day (QID) | ORAL | Status: DC | PRN
  Administered 2018-07-25: 650 mg via ORAL
  Filled 2018-07-23: qty 2

## 2018-07-23 MED ORDER — ATORVASTATIN CALCIUM 10 MG PO TABS
20.0000 mg | ORAL_TABLET | Freq: Every day | ORAL | Status: DC
  Administered 2018-07-24: 20 mg via ORAL
  Filled 2018-07-23: qty 2

## 2018-07-23 MED ORDER — ASPIRIN EC 81 MG PO TBEC
81.0000 mg | DELAYED_RELEASE_TABLET | Freq: Two times a day (BID) | ORAL | Status: DC
  Administered 2018-07-23 – 2018-07-25 (×4): 81 mg via ORAL
  Filled 2018-07-23 (×4): qty 1

## 2018-07-23 MED ORDER — LOSARTAN POTASSIUM 50 MG PO TABS
100.0000 mg | ORAL_TABLET | Freq: Every day | ORAL | Status: DC
  Administered 2018-07-24 – 2018-07-25 (×2): 100 mg via ORAL
  Filled 2018-07-23 (×2): qty 2

## 2018-07-23 MED ORDER — PANTOPRAZOLE SODIUM 40 MG PO TBEC
40.0000 mg | DELAYED_RELEASE_TABLET | Freq: Two times a day (BID) | ORAL | Status: DC
  Administered 2018-07-23 – 2018-07-25 (×4): 40 mg via ORAL
  Filled 2018-07-23 (×4): qty 1

## 2018-07-23 MED ORDER — TRAZODONE HCL 50 MG PO TABS: 50.0000 mg | ORAL_TABLET | Freq: Every evening | ORAL | Status: DC | PRN

## 2018-07-23 MED ORDER — TAMSULOSIN HCL 0.4 MG PO CAPS
0.4000 mg | ORAL_CAPSULE | Freq: Every day | ORAL | Status: DC
  Administered 2018-07-24: 0.4 mg via ORAL
  Filled 2018-07-23: qty 1

## 2018-07-23 MED ORDER — SODIUM CHLORIDE 0.9% FLUSH
10.0000 mL | Freq: Two times a day (BID) | INTRAVENOUS | Status: DC
  Administered 2018-07-23 – 2018-07-25 (×3): 10 mL

## 2018-07-23 MED ORDER — SODIUM CHLORIDE 0.9% FLUSH
10.0000 mL | INTRAVENOUS | Status: DC | PRN
  Administered 2018-07-25: 10 mL
  Filled 2018-07-23: qty 40

## 2018-07-23 MED ORDER — ACETAMINOPHEN 650 MG RE SUPP: 650.0000 mg | Freq: Four times a day (QID) | RECTAL | Status: DC | PRN

## 2018-07-23 MED ORDER — LORAZEPAM 0.5 MG PO TABS: 0.5000 mg | ORAL_TABLET | ORAL | Status: DC | PRN

## 2018-07-23 MED ORDER — GABAPENTIN 300 MG PO CAPS
300.0000 mg | ORAL_CAPSULE | Freq: Every day | ORAL | Status: DC
  Administered 2018-07-23 – 2018-07-24 (×2): 300 mg via ORAL
  Filled 2018-07-23 (×2): qty 1

## 2018-07-23 MED ORDER — SODIUM CHLORIDE 0.9% IV SOLUTION
Freq: Once | INTRAVENOUS | Status: AC
  Administered 2018-07-23: 17:00:00 via INTRAVENOUS

## 2018-07-23 MED ORDER — OLANZAPINE 5 MG PO TABS
5.0000 mg | ORAL_TABLET | Freq: Two times a day (BID) | ORAL | Status: DC
  Administered 2018-07-23 – 2018-07-25 (×4): 5 mg via ORAL
  Filled 2018-07-23 (×4): qty 1

## 2018-07-23 MED ORDER — MIRTAZAPINE 15 MG PO TBDP
15.0000 mg | ORAL_TABLET | Freq: Every day | ORAL | Status: DC
  Administered 2018-07-23 – 2018-07-24 (×2): 15 mg via ORAL
  Filled 2018-07-23 (×3): qty 1

## 2018-07-23 NOTE — ED Notes (Signed)
Pt here from  SNF with current DNR and pt refuses any blood products.  MD to call daughter for POC and apopriate tx.   Pt restful without any distress.

## 2018-07-23 NOTE — ED Notes (Signed)
Md and PA have spoke with daughter and blood products will be given, pt also agrees.  X 3 attempts at peripheral IV without success and waiting for IV team.

## 2018-07-23 NOTE — ED Notes (Cosign Needed)
Blood started without noted concerns to PICC line.   Pt remains confused, slurred speech but calm.  No outward distress noted.  Quickly returns to sleep.

## 2018-07-23 NOTE — ED Notes (Signed)
ED TO INPATIENT HANDOFF REPORT  ED Nurse Name and Phone #: Celene Squibb RN  S Name/Age/Gender [name] 73 y.o. female Room/Bed: 019C/019C  Code Status   Code Status: DNR  Home/SNF/Other Skilled nursing facility Patient oriented to: self Is this baseline? Yes   Triage Complete: Triage complete  Chief Complaint  hgb  Triage Note Pt here from SNF for eval post blood draw results of Low Hgb.   Pt alert but with hx of dementia, able to answer health questions.  Admitted 2 weeks ago fro same diagnosis. Denies any pain or bleeding.      Allergies Allergies  Allergen Reactions  . Amoxapine And Related Nausea And Vomiting  . Amoxicillin Itching    Itching Details unknown, info from [facility]  . Ciprocinonide [Fluocinolone] Other (See Comments)    Pt unsure  . Ciprofloxacin Other (See Comments)    Pt unsure  . Doxycycline Nausea And Vomiting  . Erythromycin Hives  . Sulfa Antibiotics Other (See Comments)    Unknown-reaction as a child  . Zofran [Ondansetron Hcl] Nausea And Vomiting    Level of Care/Admitting Diagnosis ED Disposition    ED Disposition Condition Comment   Admit  Hospital Area: [facility] [[id number]]  Level of Care: Med-Surg [16]  I expect the patient will be discharged within 24 hours: Yes  LOW acuity---Tx typically complete <24 hrs---ACUTE conditions typically can be evaluated <24 hours---LABS likely to return to acceptable levels <24 hours---IS near functional baseline---EXPECTED to return to current living arrangement---NOT newly hypoxic: Meets criteria for 5C-Observation unit  Covid Evaluation: N/A  Diagnosis: Acute blood loss anemia [299242]  Admitting Physician: Karmen Bongo [[id number]]  Attending Physician: Karmen Bongo [[id number]]  PT Class (Do Not Modify): Observation [104]  PT Acc Code (Do Not Modify): Observation [10022]       B Medical/Surgery History Past Medical History:  Diagnosis Date  . Carotid artery aneurysm ([place])  03/04/2012  . COPD (chronic obstructive pulmonary disease) ([place])   . Depression   . DJD (degenerative joint disease)   . GERD (gastroesophageal reflux disease)   . Hypertension   . Stroke [facility]    was on Plavix - not now  . Vascular dementia [facility])    Past Surgical History:  Procedure Laterality Date  . CATARACT EXTRACTION Bilateral   . COLONOSCOPY  2004  . HERNIA REPAIR Right 2014   inguinal  . KYPHOPLASTY N/A 10/28/2014   Procedure: KYPHOPLASTY;  Surgeon: Phylliss Bob, MD;  Location: [facility];  Service: Orthopedics;  Laterality: N/A;  Lumbar 1 kyphoplasty  . MICROLARYNGOSCOPY WITH CO2 LASER AND EXCISION OF VOCAL CORD LESION N/A 06/05/2013   Procedure: MICROLARYNGOSCOPY WITH CO2 LASER AND EXCISION OF VOCAL CORD POLYPS;  Surgeon: Melida Quitter, MD;  Location: [facility];  Service: ENT;  Laterality: N/A;  . TONSILLECTOMY    . TOTAL HIP ARTHROPLASTY Right 06/27/2018   Procedure: TOTAL HIP ARTHROPLASTY ANTERIOR APPROACH;  Surgeon: Rod Can, MD;  Location: [place];  Service: Orthopedics;  Laterality: Right;  . TUBAL LIGATION  30 years  . VIDEO BRONCHOSCOPY  04/24/2012   Procedure: VIDEO BRONCHOSCOPY WITHOUT FLUORO;  Surgeon: Rigoberto Noel, MD;  Location: [facility];  Service: Cardiopulmonary;  Laterality: Bilateral;     A IV Location/Drains/Wounds Patient Lines/Drains/Airways Status   Active Line/Drains/Airways    Name:   Placement date:   Placement time:   Site:   Days:   Peripheral IV 07/09/18 Right Antecubital   07/09/18    1520  Antecubital   14   Peripheral IV 07/09/18 Left Wrist   07/09/18    1842    Wrist   14   Midline Single Lumen 14/43/15 Midline Basilic 10 cm 0 cm   40/08/67    6195    Basilic   less than 1   External Urinary Catheter   07/09/18    -    -   14   Incision (Closed) 06/27/18 Hip Right   06/27/18    0920     26   Wound / Incision (Open or Dehisced) 06/26/18 Laceration Head Posterior    06/26/18    2130    Head   27          Intake/Output Last 24 hours No  intake or output data in the 24 hours ending 07/23/18 1620  Labs/Imaging Results for orders placed or performed during the hospital encounter of 07/23/18 (from the past 48 hour(s))  CBC with Differential     Status: Abnormal   Collection Time: 07/23/18  1:45 PM  Result Value Ref Range   WBC 9.5 4.0 - 10.5 K/uL   RBC 1.62 (L) 3.87 - 5.11 MIL/uL   Hemoglobin 4.9 (LL) 12.0 - 15.0 g/dL    Comment: REPEATED TO VERIFY THIS CRITICAL RESULT HAS VERIFIED AND BEEN CALLED TO M Anelly Samarin RN BY ALLISON BENNETT ON 04 29 2020 AT 1434, AND HAS BEEN READ BACK.     HCT 16.9 (L) 36.0 - 46.0 %   MCV 104.3 (H) 80.0 - 100.0 fL   MCH 30.2 26.0 - 34.0 pg   MCHC 29.0 (L) 30.0 - 36.0 g/dL   RDW 18.0 (H) 11.5 - 15.5 %   Platelets 661 (H) 150 - 400 K/uL   nRBC 0.0 0.0 - 0.2 %   Neutrophils Relative % 73 %   Neutro Abs 6.9 1.7 - 7.7 K/uL   Lymphocytes Relative 16 %   Lymphs Abs 1.6 0.7 - 4.0 K/uL   Monocytes Relative 8 %   Monocytes Absolute 0.8 0.1 - 1.0 K/uL   Eosinophils Relative 2 %   Eosinophils Absolute 0.2 0.0 - 0.5 K/uL   Basophils Relative 0 %   Basophils Absolute 0.0 0.0 - 0.1 K/uL   Immature Granulocytes 1 %   Abs Immature Granulocytes 0.08 (H) 0.00 - 0.07 K/uL    Comment: Performed at [facility] [address], [place], [place] [address]  Comprehensive metabolic panel     Status: Abnormal   Collection Time: 07/23/18  1:45 PM  Result Value Ref Range   Sodium 141 135 - 145 mmol/L   Potassium 3.8 3.5 - 5.1 mmol/L   Chloride 107 98 - 111 mmol/L   CO2 23 22 - 32 mmol/L   Glucose, Bld 103 (H) 70 - 99 mg/dL   BUN 10 8 - 23 mg/dL   Creatinine, Ser 0.67 0.44 - 1.00 mg/dL   Calcium 8.2 (L) 8.9 - 10.3 mg/dL   Total Protein 5.1 (L) 6.5 - 8.1 g/dL   Albumin 2.4 (L) 3.5 - 5.0 g/dL   AST 20 15 - 41 U/L   ALT 15 0 - 44 U/L   Alkaline Phosphatase 155 (H) 38 - 126 U/L   Total Bilirubin 0.4 0.3 - 1.2 mg/dL   GFR calc non Af Amer >60 >60 mL/min   GFR calc Af Amer >60 >60 mL/min   Anion gap 11 5  - 15    Comment: Performed at [facility]  Lab, [address] [place], [place] [address]  Lipase, blood     Status: None   Collection Time: 07/23/18  1:45 PM  Result Value Ref Range   Lipase 33 11 - 51 U/L    Comment: Performed at [facility] [address], [place], [place] [address]  Type and screen [place]     Status: None (Preliminary result)   Collection Time: 07/23/18  1:45 PM  Result Value Ref Range   ABO/RH(D) O POS    Antibody Screen NEG    Sample Expiration      07/26/2018 Performed at [facility], [place] [address], [place], [place] [address]    Unit Number [id number]    Blood Component Type RED CELLS,LR    Unit division 00    Status of Unit ALLOCATED    Transfusion Status OK TO TRANSFUSE    Crossmatch Result Compatible   Prepare RBC     Status: None   Collection Time: 07/23/18  2:47 PM  Result Value Ref Range   Order Confirmation      ORDER PROCESSED BY BLOOD BANK Performed at [facility], [place] [address] [place], [place] [address]    No results found.  Pending Labs Unresulted Labs (From admission, onward)    Start     Ordered   07/24/18 5993  Basic metabolic panel  Tomorrow morning,   R     07/23/18 1616   07/24/18 0500  CBC  Tomorrow morning,   R     07/23/18 1616   07/23/18 1619  SARS Coronavirus 2 [facility] order, Performed in [place] hospital lab)  (Novel Coronavirus, [facility] Order))  Once,   R    Question:  Patient immune status  Answer:  Normal   07/23/18 1619          Vitals/Pain Today's Vitals   07/23/18 1500 07/23/18 1515 07/23/18 1530 07/23/18 1549  BP: 105/61 128/63 116/67   Pulse: 93     Resp: 16 19 17    Temp:      TempSrc:      SpO2: 99%     Weight:    45.6 kg  PainSc:        Isolation Precautions No active isolations  Medications Medications  0.9 %  sodium chloride infusion (Manually program via Guardrails IV Fluids) (has no administration in time range)  sodium  chloride flush (NS) 0.9 % injection 10-40 mL (has no administration in time range)  sodium chloride flush (NS) 0.9 % injection 10-40 mL (has no administration in time range)  aspirin EC tablet 81 mg (has no administration in time range)  atorvastatin (LIPITOR) tablet 20 mg (has no administration in time range)  losartan (COZAAR) tablet 100 mg (has no administration in time range)  metoprolol tartrate (LOPRESSOR) tablet 25 mg (has no administration in time range)  LORazepam (ATIVAN) tablet 0.5 mg (has no administration in time range)  mirtazapine (REMERON SOL-TAB) disintegrating tablet 15 mg (has no administration in time range)  OLANZapine (ZYPREXA) tablet 5 mg (has no administration in time range)  traZODone (DESYREL) tablet 50 mg (has no administration in time range)  tamsulosin (FLOMAX) capsule 0.4 mg (has no administration in time range)  gabapentin (NEURONTIN) capsule 300 mg (has no administration in time range)  acetaminophen (TYLENOL) tablet 650 mg (has no administration in time range)    Or  acetaminophen (TYLENOL) suppository 650 mg (has no administration in time range)  pantoprazole (PROTONIX) EC tablet 40 mg (has no administration in time range)    Mobility non-ambulatory High fall risk   Focused Assessments Cardiac Assessment Handoff:  Cardiac Rhythm: Normal sinus rhythm, Heart block Lab Results  Component Value Date   CKTOTAL 97 07/01/2013   CKMB 4.4 (H) 03/10/2010   TROPONINI 0.03 (HH) 07/09/2018   Lab Results  Component Value Date   DDIMER 9.49 (H) 07/09/2018   Does the Patient currently have chest pain? No     R Recommendations: See Admitting Provider Note  Report given to:   Additional Notes:

## 2018-07-23 NOTE — ED Triage Notes (Signed)
Pt here from SNF for eval post blood draw results of Low Hgb.   Pt alert but with hx of dementia, able to answer health questions.  Admitted 2 weeks ago fro same diagnosis. Denies any pain or bleeding.

## 2018-07-23 NOTE — Progress Notes (Signed)
Report received from ED nurse. Patient arrived to unit via stretcher. Alert to self. VSS. Will start on 2nd unit of blood transfusion.

## 2018-07-23 NOTE — ED Notes (Signed)
Attempted IV without success.  MD and PA made aware. Order for IV team to be placed.

## 2018-07-23 NOTE — H&P (Signed)
History and Physical    [name]:[id number] DOB: [birth year] DOA: 07/23/2018  PCP: Jeanette Caprice, PA-C Patient coming from: [facility]; NOK: [name] daughter [phone]   Chief Complaint: progressive anemia  HPI: [name] is a 73 y.o. female with medical history significant of recent right total hip arthroplasty on April 3 with right femoral neck fracture following a fall, anemia with recent drop in hemoglobin, vascular dementia, CVA, GERD, hypertension, COPD, grade 1 diastolic dysfunction and carotid artery aneurysm.  She was readmitted from 4/15-17 for ABLA with GI consultation; the decision was made to transfuse 2 units PRBC and restarted on Eliquis.  She is enrolled in Hospice at [facility].  Since transfer to the facility, she was doing fairly well physically but is not particularly cooperative with rehab.  They were concerned but decided doing something was better than nothing.  This morning, her daughter called to speak with her and the patient would not participate.  Her daughter's biggest concern is when restrictions might be lifted and if she will ever see her again.  She just wants her happy and comfortable.  She isn't really benefiting from rehab.  Her daughter wants her to go back to Memory Care at [facility].  She thinks she likes it, the people are good to her and they love her.    ED Course: Very low Hgb, recurrent.  Initially declined PRBC.  Chest pain free, no SOB.  Family has convinced patient to  accept blood products.  Review of Systems: As per HPI; otherwise review of systems reviewed and negative.   This was quite limited based on her dementia and reticence to engage  Ambulatory Status:  Minimally ambulatory  Past Medical History:  Diagnosis Date  . Carotid artery aneurysm ([place]) 03/04/2012  . COPD (chronic obstructive pulmonary disease) ([place])   . Depression   . DJD (degenerative joint disease)   . GERD  (gastroesophageal reflux disease)   . Hypertension   . Stroke [facility])    was on Plavix - not now  . Vascular dementia [facility])     Past Surgical History:  Procedure Laterality Date  . CATARACT EXTRACTION Bilateral   . COLONOSCOPY  2004  . HERNIA REPAIR Right 2014   inguinal  . KYPHOPLASTY N/A 10/28/2014   Procedure: KYPHOPLASTY;  Surgeon: Phylliss Bob, MD;  Location: [place];  Service: Orthopedics;  Laterality: N/A;  Lumbar 1 kyphoplasty  . MICROLARYNGOSCOPY WITH CO2 LASER AND EXCISION OF VOCAL CORD LESION N/A 06/05/2013   Procedure: MICROLARYNGOSCOPY WITH CO2 LASER AND EXCISION OF VOCAL CORD POLYPS;  Surgeon: Melida Quitter, MD;  Location: [place];  Service: ENT;  Laterality: N/A;  . TONSILLECTOMY    . TOTAL HIP ARTHROPLASTY Right 06/27/2018   Procedure: TOTAL HIP ARTHROPLASTY ANTERIOR APPROACH;  Surgeon: Rod Can, MD;  Location: [place];  Service: Orthopedics;  Laterality: Right;  . TUBAL LIGATION  30 years  . VIDEO BRONCHOSCOPY  04/24/2012   Procedure: VIDEO BRONCHOSCOPY WITHOUT FLUORO;  Surgeon: Rigoberto Noel, MD;  Location: [place];  Service: Cardiopulmonary;  Laterality: Bilateral;    Social History   Socioeconomic History  . Marital status: Single    Spouse name: Not on file  . Number of children: Not on file  . Years of education: Not on file  . Highest education level: Not on file  Occupational History  . Occupation: retired    Fish farm manager: RETIRED  Social Needs  . Financial resource strain: Not on file  .  Food insecurity:    Worry: Not on file    Inability: Not on file  . Transportation needs:    Medical: Not on file    Non-medical: Not on file  Tobacco Use  . Smoking status: Former Smoker    Packs/day: 0.25    Years: 30.00    Pack years: 7.50    Types: Cigarettes  . Smokeless tobacco: Never Used  Substance and Sexual Activity  . Alcohol use: No  . Drug use: No  . Sexual activity: Not on file  Lifestyle  . Physical activity:    Days per week: Not on file     Minutes per session: Not on file  . Stress: Not on file  Relationships  . Social connections:    Talks on phone: Not on file    Gets together: Not on file    Attends religious service: Not on file    Active member of club or organization: Not on file    Attends meetings of clubs or organizations: Not on file    Relationship status: Not on file  . Intimate partner violence:    Fear of current or ex partner: Not on file    Emotionally abused: Not on file    Physically abused: Not on file    Forced sexual activity: Not on file  Other Topics Concern  . Not on file  Social History Narrative  . Not on file    Allergies  Allergen Reactions  . Amoxapine And Related Nausea And Vomiting  . Amoxicillin Itching    Itching Details unknown, info from [facility]  . Ciprocinonide [Fluocinolone] Other (See Comments)    Pt unsure  . Ciprofloxacin Other (See Comments)    Pt unsure  . Doxycycline Nausea And Vomiting  . Erythromycin Hives  . Sulfa Antibiotics Other (See Comments)    Unknown-reaction as a child  . Zofran [Ondansetron Hcl] Nausea And Vomiting    Family History  Problem Relation Age of Onset  . Breast cancer Mother   . Breast cancer Sister     Prior to Admission medications   Medication Sig Start Date End Date Taking? Authorizing Provider  acetaminophen (TYLENOL) 325 MG tablet Take 325 mg by mouth 2 (two) times daily.     [provider]  acetaminophen (TYLENOL) 500 MG tablet Take 500 mg by mouth every 4 (four) hours as needed for mild pain.    [provider]  alum & mag hydroxide-simeth ([place]) 200-200-20 MG/5ML suspension Take 30 mLs by mouth every 6 (six) hours as needed for indigestion or heartburn.    [provider]  apixaban (ELIQUIS) 5 MG TABS tablet Take 2 tablets (10 mg total) by mouth 2 (two) times daily for 7 days, THEN 1 tablet (5 mg total) 2 (two) times daily for 23 days. 07/11/18 08/10/18  Damita Lack, MD  aspirin EC 81 MG tablet  Take 1 tablet (81 mg total) by mouth 2 (two) times daily. 07/03/18 07/03/19  Elodia Florence., MD  atorvastatin (LIPITOR) 20 MG tablet Take 1 tablet (20 mg total) by mouth daily at 6 PM. 02/29/16   Reyne Dumas, MD  b complex vitamins tablet Take 1 tablet by mouth daily. 01/25/16   Rolene Course, PA-C  clopidogrel (PLAVIX) 75 MG tablet Take 75 mg by mouth daily.    [provider]  gabapentin (NEURONTIN) 300 MG capsule Take 300 mg by mouth at bedtime. 06/12/16   [provider]  guaiFENesin (ROBITUSSIN) 100  MG/5ML SOLN Take 10 mLs by mouth every 6 (six) hours as needed for cough or to loosen phlegm.    [provider]  hyoscyamine (LEVSIN, ANASPAZ) 0.125 MG tablet Take 0.125 mg by mouth See admin instructions. Take 1 tablet by mouth every 2 hours as needed excessive secretions    [provider]  loperamide (IMODIUM) 2 MG capsule Take 2 mg by mouth as needed for diarrhea or loose stools.    [provider]  LORazepam (ATIVAN) 0.5 MG tablet Take 0.5 mg by mouth every 4 (four) hours as needed for anxiety.    [provider]  losartan (COZAAR) 100 MG tablet Take 100 mg by mouth daily. 06/13/16   [provider]  magnesium hydroxide (MILK OF MAGNESIA) 400 MG/5ML suspension Take 30 mLs by mouth at bedtime as needed for mild constipation.    [provider]  metoprolol tartrate (LOPRESSOR) 25 MG tablet Take 25 mg by mouth 2 (two) times daily.    [provider]  mirtazapine (REMERON) 30 MG tablet Take 30 mg by mouth at bedtime. 06/28/16   [provider]  OLANZapine (ZYPREXA) 7.5 MG tablet Take 7.5 mg by mouth 2 (two) times daily.     [provider]  polyethylene glycol (MIRALAX / GLYCOLAX) packet Take 17 g by mouth daily. 04/08/18   Bonnielee Haff, MD  tamsulosin (FLOMAX) 0.4 MG CAPS capsule Take 1 capsule (0.4 mg total) by mouth daily after supper for 30 days. 07/03/18 08/02/18  Elodia Florence., MD   traZODone (DESYREL) 50 MG tablet Take 50 mg by mouth at bedtime as needed for sleep.    [provider]  trolamine salicylate (ASPERCREME) 10 % cream Apply 1 application topically 2 (two) times daily as needed for muscle pain.     [provider]  vitamin C (ASCORBIC ACID) 500 MG tablet Take 500 mg by mouth daily.    [provider]    Physical Exam: Vitals:   07/23/18 1500 07/23/18 1515 07/23/18 1530 07/23/18 1549  BP: 105/61 128/63 116/67   Pulse: 93     Resp: 16 19 17    Temp:      TempSrc:      SpO2: 99%     Weight:    45.6 kg     . General:  Appears calm and comfortable and is NAD; she is able to answer simple yes/no questions at times . Eyes:  PERRL, EOMI, normal lids, iris . ENT:  grossly normal hearing, lips & tongue, mmm . Neck:  no LAD, masses or thyromegaly . Cardiovascular:  RRR, no m/r/g. No LE edema.  Marland Kitchen Respiratory:   CTA bilaterally with no wheezes/rales/rhonchi.  Normal respiratory effort. . Abdomen:  soft, NT, ND, NABS . Skin:  no rash or induration seen on limited exam . Musculoskeletal: atrophic RLE,  no bony abnormality . Psychiatric: somewhat disaffected mood and affect, speech sparse with apparently appropriate yes/no answers only intermittently . Neurologic:unable to perform    Radiological Exams on Admission: No results found.  EKG: Independently reviewed.  NSR with rate 88; nonspecific ST changes with no evidence of acute ischemia; low voltage   Labs on Admission: I have personally reviewed the available labs and imaging studies at the time of the admission.  Pertinent labs:   Glucose 103 AP 155 Albumin 2.4 WBC 9.5 Hgb 4.9, prior 6.9 on 4/17 with apparent baseline 7-8  Assessment/Plan Principal Problem:   Acute blood loss anemia Active Problems:  Essential hypertension   CKD (chronic kidney disease) stage 3, GFR 30-59 ml/min ([name])   Vascular dementia without behavioral disturbance ([name])   Pulmonary embolism  ([name])   H/O: CVA (cerebrovascular accident)   Acute blood loss anemia associated with presumed GI bleed -Patient with hospitalization from 4/2-9 after femoral neck fracture from a mechanical fall at her memory care facility -Her post-op course was complicated at that time by dysphagia, but she was eventually changed to a mechanical soft diet for comfort -She also had post-op anemia requiring 1 unit PRBC -She was discharged on ASA 81 mg BID for DVT prophylaxis given a TBI with hematoma and increased risk for ongoing falls -She returned from 4/15-17 for SOB thought to be related to symptomatic anemia (Hgb <6) but also found to have B PE. -The decision was made to not perform any procedural intervention -She was continued on ASA BID; Plavix; and Eliquis -At this time, the patient is in a difficult situation and the decision has to be made to either continue to aggressively treat PE at the expense of GI bleed or vice versa -After prolonged discussion with her daughter, the daughter's primary goals of care at this time are for her mother to be comfortable. -She has already been enrolled in Hospice, and the daughter prefers for her mother to "go home" to [facility] and no longer have SNF rehab -Since we are moving to comfort measures, we discussed further anticoagulation and ultimately decided to stop Plavix and Eliquis -Will continue ASA BID at this time -Will try to make contact with her home hospice (although this is NOT Hospice and [place]; the CM is attempting to determine who her hospice provider is.  It appears that we are unable to contact her prior hospice provider and so will re-consult [facility]. -Will observe and transfuse -Will test for COVID-19 - with very low suspicion for disease - so that family can come to [facility] tomorrow to visit with the patient prior to her transitioning back into hospice -No frank bleeding at this time; if brisk bleeding develops then  the patient could be considered for GI intervention but she is quite frail and so overall not a good candidate for intervention  PE -4/15 CTA positive for a few B subsegmental PE without lung infarction -She was started on Eliquis despite known GI bleed with the hope that she would be able to equilibrate -Unfortunately, she likely has continued to bleed on Eliquis; Hgb is now 4.9 -Given this new information, she is no longer a candidate for anticoagulation - despite known PE -As such, she is appropriate for Hospice - possibly even residential hospice -At this time, it appears that her family would like to visit her here in the hospital tomorrow, and would also prefer that she return to [facility] with hospice  Vascular dementia -Advanced dementia -She has not made significant improvement with rehab due to non-willingness to participate -Her daughter reports that she was not even willing to interact with her daughter today via FaceTime -The COVID-19 social distancing plans have made things very difficult for this patient/family  CKD -Appears to have stable renal function at this time  HTN   H/o CVA -Stop Plavix and Eliquis -Continue 81 mg ASA BID -Continue Lopressor and Cozaar for now -Continue Lipitor for now, although this may not be necessary assuming the plan moves forward as outlined above   DVT prophylaxis:  SCDs Code Status:  DNR - confirmed with family  Family Communication: None present; I spoke with her daughter by telephone  Disposition Plan: Anticipate d/c to residential hospice vs. ALF with hospice Consults called: Hospice  Admission status: It is my clinical opinion that referral for OBSERVATION is reasonable and necessary in this patient based on the above information provided. The aforementioned taken together are felt to place the patient at high risk for further clinical deterioration. However it is anticipated that the patient may be medically stable for  discharge from the hospital within 24 to 48 hours.    Karmen Bongo MD [facility]   How to contact the [facility] Attending or Consulting provider [place] or covering provider during after hours [place], for this patient?  1. Check the care team in [facility] and look for a) attending/consulting [facility] provider listed and b) the [facility] team listed 2. Log into [email or web address] and use [place]'s universal password to access. If you do not have the password, please contact the hospital operator. 3. Locate the [facility] provider you are looking for under [facility] and page to a number that you can be directly reached. 4. If you still have difficulty reaching the provider, please page the [facility] (Director on Call) for the Hospitalists listed on amion for assistance.   07/23/2018, 4:28 PM

## 2018-07-23 NOTE — ED Provider Notes (Addendum)
[facility] EMERGENCY DEPARTMENT Provider Note   CSN: [id number] Arrival date & time: 07/23/18  1251    History   Chief Complaint No chief complaint on file.   HPI [name] is a 73 y.o. female presenting from [place] for hemoglobin of 4.6 today.  Patient with history of dementia was admitted for anemia and pulmonary embolism on 07/09/2018.  History is limited from the patient however she states that she is feeling well and is without pain or shortness of breath.     HPI  Past Medical History:  Diagnosis Date   Carotid artery aneurysm ([place]) 03/04/2012   COPD (chronic obstructive pulmonary disease) ([name])    Depression    DJD (degenerative joint disease)    GERD (gastroesophageal reflux disease)    Hypertension    Stroke [facility])    was on Plavix - not now   Vascular dementia [facility])     Patient Active Problem List   Diagnosis Date Noted   Symptomatic anemia 07/09/2018   Acute blood loss as cause of postoperative anemia 06/28/2018   Displaced fracture of right femoral neck ([place]) 06/27/2018   Multiple fractures of ribs, right side, initial encounter for closed fracture 06/26/2018   Fracture of femoral neck, right, closed ([place]) 06/26/2018   Hematoma of right parietal scalp 06/26/2018   Leukocytosis 06/26/2018   Inadequate pain control 06/26/2018   Hip fracture ([place]) 06/26/2018   AKI (acute kidney injury) ([place]) 04/04/2018   Hypoxia 04/04/2018   Probable sepsis 04/04/2018   Vascular dementia without behavioral disturbance ([place]) 06/30/2016   Disease ruled out after examination    Aphasia 05/15/2016   Atherosclerosis of left carotid artery    Cerebral embolism with cerebral infarction 02/27/2016   Acute encephalopathy 02/26/2016   Osteoporosis 07/04/2015   Impaired cognition 03/30/2014   Vitamin D deficiency 08/18/2013   Hyperlipidemia 08/18/2013   Prediabetes 08/18/2013   Hospice care patient 08/18/2013     CKD (chronic kidney disease) stage 3, GFR 30-59 ml/min ([name]) 07/01/2013   Anemia of chronic disease 07/01/2013   Depression, major, in remission ([place]) 06/30/2013   Essential hypertension 02/09/2013   COPD GOLD III 03/28/2012   Vocal cord dysfunction 03/28/2012   Carotid artery aneurysm ([place]) 03/04/2012   Smoker 03/04/2012    Past Surgical History:  Procedure Laterality Date   CATARACT EXTRACTION Bilateral    COLONOSCOPY  2004   HERNIA REPAIR Right 2014   inguinal   KYPHOPLASTY N/A 10/28/2014   Procedure: KYPHOPLASTY;  Surgeon: Phylliss Bob, MD;  Location: [facility];  Service: Orthopedics;  Laterality: N/A;  Lumbar 1 kyphoplasty   MICROLARYNGOSCOPY WITH CO2 LASER AND EXCISION OF VOCAL CORD LESION N/A 06/05/2013   Procedure: MICROLARYNGOSCOPY WITH CO2 LASER AND EXCISION OF VOCAL CORD POLYPS;  Surgeon: Melida Quitter, MD;  Location: [facility];  Service: ENT;  Laterality: N/A;   TONSILLECTOMY     TOTAL HIP ARTHROPLASTY Right 06/27/2018   Procedure: TOTAL HIP ARTHROPLASTY ANTERIOR APPROACH;  Surgeon: Rod Can, MD;  Location: [facility];  Service: Orthopedics;  Laterality: Right;   TUBAL LIGATION  30 years   VIDEO BRONCHOSCOPY  04/24/2012   Procedure: VIDEO BRONCHOSCOPY WITHOUT FLUORO;  Surgeon: Rigoberto Noel, MD;  Location: [facility];  Service: Cardiopulmonary;  Laterality: Bilateral;     OB History   No obstetric history on file.      Home Medications    Prior to Admission medications   Medication Sig Start Date End Date Taking? Authorizing  Provider  acetaminophen (TYLENOL) 325 MG tablet Take 325 mg by mouth 2 (two) times daily.    Yes [provider]  acetaminophen (TYLENOL) 500 MG tablet Take 500 mg by mouth every 4 (four) hours as needed for mild pain.   Yes [provider]  alum & mag hydroxide-simeth ([place]) 200-200-20 MG/5ML suspension Take 30 mLs by mouth every 6 (six) hours as needed for indigestion or heartburn.   Yes [provider]   apixaban (ELIQUIS) 5 MG TABS tablet Take 2 tablets (10 mg total) by mouth 2 (two) times daily for 7 days, THEN 1 tablet (5 mg total) 2 (two) times daily for 23 days. Patient taking differently: 5mg  twice daily 07/11/18 08/10/18 Yes Amin, Jeanella Flattery, MD  aspirin EC 81 MG tablet Take 1 tablet (81 mg total) by mouth 2 (two) times daily. 07/03/18 07/03/19 Yes Elodia Florence., MD  atorvastatin (LIPITOR) 20 MG tablet Take 1 tablet (20 mg total) by mouth daily at 6 PM. 02/29/16  Yes Reyne Dumas, MD  b complex vitamins tablet Take 1 tablet by mouth daily. 01/25/16  Yes Rolene Course, PA-C  clopidogrel (PLAVIX) 75 MG tablet Take 75 mg by mouth daily.   Yes [provider]  gabapentin (NEURONTIN) 300 MG capsule Take 300 mg by mouth at bedtime. 06/12/16  Yes [provider]  guaiFENesin (ROBITUSSIN) 100 MG/5ML SOLN Take 10 mLs by mouth every 6 (six) hours as needed for cough or to loosen phlegm.   Yes [provider]  hyoscyamine (LEVSIN, ANASPAZ) 0.125 MG tablet Take 0.125 mg by mouth See admin instructions. Take 1 tablet by mouth every 2 hours as needed excessive secretions   Yes [provider]  ipratropium-albuterol (DUONEB) 0.5-2.5 (3) MG/3ML SOLN Take 3 mLs by nebulization every 6 (six) hours as needed (shortness of breath).   Yes [provider]  loperamide (IMODIUM) 2 MG capsule Take 2 mg by mouth daily as needed for diarrhea or loose stools.    Yes [provider]  LORazepam (ATIVAN) 0.5 MG tablet Take 0.5 mg by mouth every 4 (four) hours as needed for anxiety.   Yes [provider]  losartan (COZAAR) 100 MG tablet Take 100 mg by mouth daily. 06/13/16  Yes [provider]  magnesium hydroxide (MILK OF MAGNESIA) 400 MG/5ML suspension Take 30 mLs by mouth at bedtime as needed for mild constipation.   Yes [provider]  metoprolol tartrate (LOPRESSOR) 25 MG tablet Take 25 mg by mouth 2 (two) times daily.   Yes [provider]  mirtazapine (REMERON SOL-TAB) 15 MG disintegrating tablet Take 15 mg by mouth at bedtime.  06/28/16  Yes [provider]  OLANZapine (ZYPREXA) 5 MG tablet Take 5 mg by mouth 2 (two) times daily.    Yes [provider]  OXYGEN Inhale 2 L into the lungs continuous.   Yes [provider]  polyethylene glycol (MIRALAX / GLYCOLAX) packet Take 17 g by mouth daily. 04/08/18  Yes Bonnielee Haff, MD  tamsulosin (FLOMAX) 0.4 MG CAPS capsule Take 1 capsule (0.4 mg total) by mouth daily after supper for 30 days. 07/03/18 08/02/18 Yes Elodia Florence., MD  traZODone (DESYREL) 50 MG tablet Take 50 mg by mouth at bedtime as needed for sleep.   Yes [provider]  trolamine salicylate (ASPERCREME) 10 % cream Apply 1 application topically 2 (two) times daily as needed for muscle pain.    Yes [provider]  vitamin C (  ASCORBIC ACID) 500 MG tablet Take 500 mg by mouth daily.   Yes [provider]    Family History Family History  Problem Relation Age of Onset   Breast cancer Mother    Breast cancer Sister     Social History Social History   Tobacco Use   Smoking status: Former Smoker    Packs/day: 0.25    Years: 30.00    Pack years: 7.50    Types: Cigarettes   Smokeless tobacco: Never Used  Substance Use Topics   Alcohol use: No   Drug use: No     Allergies   Amoxapine and related; Amoxicillin; Ciprocinonide [fluocinolone]; Ciprofloxacin; Doxycycline; Erythromycin; Sulfa antibiotics; and Zofran [ondansetron hcl]   Review of Systems Review of Systems  Unable to perform ROS: Dementia  Respiratory: Negative for shortness of breath.   Cardiovascular: Negative for chest pain.    Physical Exam Updated Vital Signs BP 116/67    Pulse 93    Temp 98 F (36.7 C) (Oral)    Resp 17    Wt 45.6 kg    SpO2 99%    BMI 18.39 kg/m   Physical Exam Constitutional:      General: She is not in acute distress.    Appearance:  Normal appearance. She is well-developed. She is not ill-appearing or diaphoretic.  HENT:     Head: Normocephalic and atraumatic.     Right Ear: External ear normal.     Left Ear: External ear normal.     Nose: Nose normal.  Eyes:     General: Vision grossly intact. Gaze aligned appropriately.     Pupils: Pupils are equal, round, and reactive to light.  Neck:     Musculoskeletal: Normal range of motion.     Trachea: Trachea and phonation normal. No tracheal deviation.  Cardiovascular:     Rate and Rhythm: Normal rate and regular rhythm.     Pulses: Normal pulses.     Heart sounds: Normal heart sounds.  Pulmonary:     Effort: Pulmonary effort is normal. No respiratory distress.     Breath sounds: Normal breath sounds.  Abdominal:     General: There is no distension.     Palpations: Abdomen is soft.     Tenderness: There is no abdominal tenderness. There is no guarding or rebound.  Musculoskeletal: Normal range of motion.  Skin:    General: Skin is warm and dry.  Neurological:     Mental Status: She is alert.     GCS: GCS eye subscore is 4. GCS verbal subscore is 5. GCS motor subscore is 6.     Comments: Speech is clear and goal oriented, follows commands Major Cranial nerves without deficit, no facial droop Moves extremities without ataxia, coordination intact  Psychiatric:        Behavior: Behavior normal.    ED Treatments / Results  Labs (all labs ordered are listed, but only abnormal results are displayed) Labs Reviewed  CBC WITH DIFFERENTIAL/PLATELET - Abnormal; Notable for the following components:      Result Value   RBC 1.62 (*)    Hemoglobin 4.9 (*)    HCT 16.9 (*)    MCV 104.3 (*)    MCHC 29.0 (*)    RDW 18.0 (*)    Platelets 661 (*)    Abs Immature Granulocytes 0.08 (*)    All other components within normal limits  COMPREHENSIVE METABOLIC PANEL - Abnormal; Notable for the following components:   Glucose,  Bld 103 (*)    Calcium 8.2 (*)    Total Protein  5.1 (*)    Albumin 2.4 (*)    Alkaline Phosphatase 155 (*)    All other components within normal limits  LIPASE, BLOOD  URINALYSIS, ROUTINE W REFLEX MICROSCOPIC  TYPE AND SCREEN  PREPARE RBC (CROSSMATCH)    EKG EKG Interpretation  Date/Time:  Wednesday July 23 2018 12:55:48 EDT Ventricular Rate:  88 PR Interval:    QRS Duration: 94 QT Interval:  367 QTC Calculation: 444 R Axis:   58 Text Interpretation:  Sinus rhythm Low voltage, extremity leads Confirmed by Isla Pence [id number]) on 07/23/2018 12:58:19 PM   Radiology No results found.  Procedures .Critical Care Performed by: Deliah Boston, PA-C Authorized by: Deliah Boston, PA-C   Critical care provider statement:    Critical care time (minutes):  31   Critical care was necessary to treat or prevent imminent or life-threatening deterioration of the following conditions:  Circulatory failure (Anemia, hemoglobin of 4.9)   Critical care was time spent personally by me on the following activities:  Ordering and performing treatments and interventions, ordering and review of radiographic studies, ordering and review of laboratory studies, pulse oximetry, re-evaluation of patient's condition, review of old charts, obtaining history from patient or surrogate, examination of patient, evaluation of patient's response to treatment, discussions with consultants and development of treatment plan with patient or surrogate   (including critical care time)  Medications Ordered in ED Medications  0.9 %  sodium chloride infusion (Manually program via Guardrails IV Fluids) (has no administration in time range)     Initial Impression / Assessment and Plan / ED Course  I have reviewed the triage vital signs and the nursing notes.  Pertinent labs & imaging results that were available during my care of the patient were reviewed by me and considered in my medical decision making (see chart for details).  Clinical Course as of  Jul 23 1606  Wed Jul 23, 2018  1304 [name] daughter [phone]    [BM]  1440 Discussed lab results and plan of care with the patient who has accepted blood transfusion at this time.  I then discussed patient's case with her daughter [name] who is asked that we give blood transfusion at this time.  2 units packed red blood cells ordered.  Consult called for admission.   [BM]    Clinical Course User Index [BM] Deliah Boston, PA-C    Chart review of 07/11/2018 shows patient was recently discharged on Eliquis following pulmonary embolism and symptomatic anemia.  GI consulted during that visit and family did not wish to proceed with any procedural intervention.  Vital signs stable CBC with hemoglobin of 4.9 CMP nonacute Lipase normal limits Urinalysis pending Type and screen O+ EKG without acute findings reviewed with Dr. Gilford Raid - Patient seen and evaluated by Dr. Gilford Raid during this visit who guided management. - As above discussion was held with both the patient and her power of attorney daughter [name] via phone they wish to proceed with blood transfusion at this time as a lifesaving procedure.  Patient was seen and evaluated by Dr. Gilford Raid who agrees with plan of care at this time.  Consult called for admission. - Discussed case with Dr. Lorin Mercy who is seeing patient in ED.   Note: Portions of this report may have been transcribed using voice recognition software. Every effort was made to ensure accuracy; however, inadvertent computerized transcription errors  may still be present. Final Clinical Impressions(s) / ED Diagnoses   Final diagnoses:  Symptomatic anemia    ED Discharge Orders    None       Gari Crown 07/23/18 1609    Gari Crown 07/23/18 1611    Isla Pence, MD 07/29/18 1513

## 2018-07-24 DIAGNOSIS — L8990 Pressure ulcer of unspecified site, unspecified stage: Secondary | ICD-10-CM

## 2018-07-24 LAB — BPAM RBC
Blood Product Expiration Date: 202005052359
Blood Product Expiration Date: 202005052359
ISSUE DATE / TIME: 202004291658
ISSUE DATE / TIME: 202004292057
Unit Type and Rh: 5100
Unit Type and Rh: 5100

## 2018-07-24 LAB — TYPE AND SCREEN
ABO/RH(D): O POS
Antibody Screen: NEGATIVE
Unit division: 0
Unit division: 0

## 2018-07-24 LAB — BASIC METABOLIC PANEL
Anion gap: 10 (ref 5–15)
BUN: 10 mg/dL (ref 8–23)
CO2: 22 mmol/L (ref 22–32)
Calcium: 8.3 mg/dL — ABNORMAL LOW (ref 8.9–10.3)
Chloride: 106 mmol/L (ref 98–111)
Creatinine, Ser: 0.62 mg/dL (ref 0.44–1.00)
GFR calc Af Amer: >60 mL/min (ref >60)
GFR calc non Af Amer: >60 mL/min (ref >60)
Glucose, Bld: 81 mg/dL (ref 70–99)
Potassium: 4 mmol/L (ref 3.5–5.1)
Sodium: 138 mmol/L (ref 135–145)

## 2018-07-24 LAB — CBC
HCT: 30 % — ABNORMAL LOW (ref 36.0–46.0)
Hemoglobin: 9.7 g/dL — ABNORMAL LOW (ref 12.0–15.0)
MCH: 28.4 pg (ref 26.0–34.0)
MCHC: 32.3 g/dL (ref 30.0–36.0)
MCV: 88 fL (ref 80.0–100.0)
Platelets: 488 10*3/uL — ABNORMAL HIGH (ref 150–400)
RBC: 3.41 MIL/uL — ABNORMAL LOW (ref 3.87–5.11)
RDW: 23.5 % — ABNORMAL HIGH (ref 11.5–15.5)
WBC: 14.2 10*3/uL — ABNORMAL HIGH (ref 4.0–10.5)
nRBC: 0.5 % — ABNORMAL HIGH (ref 0.0–0.2)

## 2018-07-24 MED ORDER — COLLAGENASE 250 UNIT/GM EX OINT: TOPICAL_OINTMENT | Freq: Every day | CUTANEOUS | 0 refills | Status: AC

## 2018-07-24 MED ORDER — COLLAGENASE 250 UNIT/GM EX OINT
TOPICAL_OINTMENT | Freq: Every day | CUTANEOUS | Status: DC
  Administered 2018-07-24 – 2018-07-25 (×2): via TOPICAL
  Filled 2018-07-24: qty 30

## 2018-07-24 NOTE — Consult Note (Addendum)
[facility] Nurse wound consult note Reason for Consult: Consult requested for bilat heels and buttocks.  Pt has Covid-19 test results pending; performed consult remotely after review of progress notes, and description and measurements of the pressure injuries in the nursing wound flow sheet. Wound type: Right ankle is noted to have a stage 1 pressure injury Middle back is noted to have a stage 2 pressure injury; .5X.5cm Right heel is noted to have a stage 1 pressure injury Right heel is noted to have an unstageable pressure injury; 3.5X3cm, 100% dry eschar Left buttock is noted to have an unstageable pressure injury; 1X2.5cm Pressure Injury POA: Yes Dressing procedure/placement/frequency: Foam dressings to protect right ankle, left heel, middle back.  It is best practice to leave dry eschar in place to right heel wound; float to reduce pressure.  Santyl for enzymatic debridement of nonviable tissue to left buttock. Please re-consult if further assistance is needed.  Thank-you,  Julien Girt MSN, [place], [place], [place], [place]

## 2018-07-24 NOTE — Progress Notes (Addendum)
Update: CSW talked to [place] patient's daughter, after discussing patient's prognosis and consulting with Dr. Wynelle Cleveland family and treatment team believe it is best for patient to go to residential hospice as according to MD patient has 2-3 weeks to live. Daughter accepting of this and reports wanting best care for her mother.   CSW made referral to [facility] per daughter's request.       Pricilla Riffle, LCSW [phone]

## 2018-07-24 NOTE — TOC Initial Note (Signed)
Transition of Care [facility]) - Initial/Assessment Note    Patient Details  Name: [name] MRN: [id number] Date of Birth: 22-Jan-1946  Transition of Care [facility]) CM/SW Contact:    [name], LCSW Phone Number:[phone] 07/24/2018, 12:01 PM  Clinical Narrative:                  CSW consulted with patient's daughter [name] regarding dc planning. Cshe reports wanting patient to return to [facility] where she came from. CSW informed [place]hele[place] that CSW has reached out to [facility] to confirm patient can return, awaiting confirmation but anticipating no issues. [name] reports she is on the way to the hospital to see patient and requests later transport today if possible, CSW will ensure [name] has time with patient this afternoon before scheduling transport.   Expected Discharge Plan: Skilled Nursing Facility Barriers to Discharge: No Barriers Identified   Patient Goals and CMS Choice   CMS Medicare.gov Compare Post Acute Care list provided to:: Patient Represenative (must comment)(daughter [name]) Choice offered to / list presented to : Adult Children  Expected Discharge Plan and Services Expected Discharge Plan: [place] In-house Referral: NA Discharge Planning Services: NA Post Acute Care Choice: [place] Living arrangements for the past 2 months: Skilled Nursing Facility([facility]HC)                 DME Arranged: N/A DME Agency: NA       HH Arranged: NA HH Agency: NA        Prior Living Arrangements/Services Living arrangements for the past 2 months: Skilled Nursing Facility(GHC) Lives with:: Self Patient language and need for interpreter reviewed:: Yes Do you feel safe going back to the place where you live?: Yes      Need for Family Participation in Patient Care: Yes (Comment) Care giver support system in place?: Yes (comment)   Criminal Activity/Legal Involvement Pertinent to Current Situation/Hospitalization: No - Comment as  needed  Activities of Daily Living      Permission Sought/Granted Permission sought to share information with : Case Manager, Customer service manager, Family Supports Permission granted to share information with : Yes, Verbal Permission Granted  Share Information with NAME: [name]  Permission granted to share info w AGENCY: SNFs  Permission granted to share info w Relationship: daughter  Permission granted to share info w Contact Information: [phone]  Emotional Assessment Appearance:: Appears stated age Attitude/Demeanor/Rapport: Unable to Assess Affect (typically observed): Unable to Assess Orientation: : Oriented to Self Alcohol / Substance Use: Not Applicable Psych Involvement: No (comment)  Admission diagnosis:  Symptomatic anemia [D64.9] Patient Active Problem List   Diagnosis Date Noted  . Acute blood loss anemia 07/23/2018  . Pulmonary embolism ([place]) 07/23/2018  . H/O: CVA (cerebrovascular accident) 07/23/2018  . Symptomatic anemia 07/09/2018  . Acute blood loss as cause of postoperative anemia 06/28/2018  . Displaced fracture of right femoral neck ([place]) 06/27/2018  . Multiple fractures of ribs, right side, initial encounter for closed fracture 06/26/2018  . Fracture of femoral neck, right, closed ([place]) 06/26/2018  . Hematoma of right parietal scalp 06/26/2018  . Leukocytosis 06/26/2018  . Inadequate pain control 06/26/2018  . Hip fracture ([place]) 06/26/2018  . AKI (acute kidney injury) ([place]) 04/04/2018  . Hypoxia 04/04/2018  . Probable sepsis 04/04/2018  . Vascular dementia without behavioral disturbance ([place]) 06/30/2016  . Disease ruled out after examination   . Aphasia 05/15/2016  . Atherosclerosis of left carotid artery   . Cerebral embolism  with cerebral infarction 02/27/2016  . Acute encephalopathy 02/26/2016  . Osteoporosis 07/04/2015  . Impaired cognition 03/30/2014  . Vitamin D deficiency 08/18/2013  . Hyperlipidemia 08/18/2013  .  Prediabetes 08/18/2013  . Hospice care patient 08/18/2013  . CKD (chronic kidney disease) stage 3, GFR 30-59 ml/min ([name]) 07/01/2013  . Anemia of chronic disease 07/01/2013  . Depression, major, in remission ([place]) 06/30/2013  . Essential hypertension 02/09/2013  . COPD GOLD III 03/28/2012  . Vocal cord dysfunction 03/28/2012  . Carotid artery aneurysm ([place]) 03/04/2012  . Smoker 03/04/2012   PCP:  Jeanette Caprice, PA-C Pharmacy:   [place], [place] [address] [place] [address] Phone: [phone] Fax: [phone]     Social Determinants of Health ([place]) Interventions    Readmission Risk Interventions Readmission Risk Prevention Plan 06/27/2018  Transportation Screening Complete  PCP or Specialist Appt within 3-5 Days Complete  HRI or [place] Complete  Social Work Consult for [place] Planning/Counseling Complete  Palliative Care Screening Complete  Medication Review Press photographer) Complete  Some recent data might be hidden

## 2018-07-24 NOTE — Discharge Summary (Addendum)
Physician Discharge Summary  [name] NWG:[id number] DOB: [birth year] DOA: 07/23/2018  PCP: Jeanette Caprice, PA-C  Admit date: 07/23/2018 Discharge date: 07/25/2018  Admitted From: SNF Disposition:  Hospice home    Discharge Condition:  stable   CODE STATUS:  DNR   Diet recommendation:  Diet as tolerated Consultations:  none    Discharge Diagnoses:  Principal Problem:   Acute blood loss anemia likely due to occult/ chronic GI bleeding Active Problems:    Pulmonary embolism     CKD (chronic kidney disease) stage 3, GFR 30-59 ml/min ([name])   Vascular dementia without behavioral disturbance ([place])   H/O: CVA (cerebrovascular accident)  Brief Summary: [name] is a 73 y.o. female with medical history significant of ecent right total hip arthroplasty on April 3 with right femoral neck fracture, right rib fractures and right scalp hematoma following a fall, vascular dementia, CVA, GERD, hypertension, COPD, grade 1 diastolic dysfunction and carotid artery aneurysm. She was discharged on 4/9 after fracture repair on BID Aspirin 81 mg.    She was readmitted from 4/15-17 and found to have b/l PEs and was hemoccult + with Hb. Hb was noted to have dropped  from 8.3 to 5.9 in 1 wk. The decision was made to transfuse 2 units PRBC and start on Eliquis. A GI work up was not done.  She was enrolled in Hospice at [facility].  She returns to the hospital as her Hb was found to be low again and in the ED Hb is noted to be 4.9. She was admitted for a blood transfusion. Her daughter has also decided to stop Xarelto at the advise of the admitting physician and subsequently transition her mother to comfort care.  I feel that she has about 2-3 wks to live.  Hospital Course:  Anemia due to chronic GI bleed - hemoccult + on 4/15- GI work up deferred - suspected to be due to an ongoing GI bleed in setting of Xarelto use - received 2 U PRBC- Hb today is 9.7  Recent bilateral PE after  hip repair - Xarelto to be discontinued due to persistently dropping Hb - her daughter understands the she may develop further PEs  GI bleed - no obvious bleeding noted in the hospital but has been hemoccult + in the past -family does not want to pursue further investigations  Dementia - non verbal at this time on exam- no behavioral disturbances noted   Discharge Exam: Vitals:   07/24/18 2043 07/25/18 0423  BP: (!) 129/59 140/60  Pulse: 68 (!) 59  Resp: 19 20  Temp: 97.8 F (36.6 C) 97.9 F (36.6 C)  SpO2: 100% 98%   Vitals:   07/24/18 0945 07/24/18 1829 07/24/18 2043 07/25/18 0423  BP: 135/62 115/62 (!) 129/59 140/60  Pulse: 68 75 68 (!) 59  Resp: 14 16 19 20   Temp: 98.2 F (36.8 C) 97.6 F (36.4 C) 97.8 F (36.6 C) 97.9 F (36.6 C)  TempSrc: Oral Oral Oral Oral  SpO2: 100% 95% 100% 98%  Weight:   45.1 kg     General: Pt is alert, awake, not in acute distress Cardiovascular: RRR, S1/S2 +  Respiratory: CTA bilaterally, no wheezing, no rhonchi Abdominal: Soft, NT, ND, bowel sounds + Extremities: no edema, no cyanosis   Discharge Instructions  Discharge Instructions    Increase activity slowly   Complete by:  As directed      Allergies as of 07/25/2018      Reactions  Amoxapine And Related Nausea And Vomiting   Amoxicillin Itching   Itching Details unknown, info from [facility]   Ciprocinonide [fluocinolone] Other (See Comments)   Pt unsure   Ciprofloxacin Other (See Comments)   Pt unsure   Doxycycline Nausea And Vomiting   Erythromycin Hives   Sulfa Antibiotics Other (See Comments)   Unknown-reaction as a child   Zofran [ondansetron Hcl] Nausea And Vomiting      Medication List    STOP taking these medications   apixaban 5 MG Tabs tablet Commonly known as:  ELIQUIS   aspirin EC 81 MG tablet   atorvastatin 20 MG tablet Commonly known as:  LIPITOR   clopidogrel 75 MG tablet Commonly known as:  PLAVIX   losartan 100 MG tablet Commonly known as:   COZAAR     TAKE these medications   acetaminophen 325 MG tablet Commonly known as:  TYLENOL Take 325 mg by mouth 2 (two) times daily.   acetaminophen 500 MG tablet Commonly known as:  TYLENOL Take 500 mg by mouth every 4 (four) hours as needed for mild pain.   b complex vitamins tablet Take 1 tablet by mouth daily.   collagenase ointment Commonly known as:  SANTYL Apply topically daily.   gabapentin 300 MG capsule Commonly known as:  NEURONTIN Take 300 mg by mouth at bedtime.   guaiFENesin 100 MG/5ML Soln Commonly known as:  ROBITUSSIN Take 10 mLs by mouth every 6 (six) hours as needed for cough or to loosen phlegm.   hyoscyamine 0.125 MG tablet Commonly known as:  LEVSIN Take 0.125 mg by mouth See admin instructions. Take 1 tablet by mouth every 2 hours as needed excessive secretions   ipratropium-albuterol 0.5-2.5 (3) MG/3ML Soln Commonly known as:  DUONEB Take 3 mLs by nebulization every 6 (six) hours as needed (shortness of breath).   loperamide 2 MG capsule Commonly known as:  IMODIUM Take 2 mg by mouth daily as needed for diarrhea or loose stools.   LORazepam 0.5 MG tablet Commonly known as:  ATIVAN Take 0.5 mg by mouth every 4 (four) hours as needed for anxiety.   magnesium hydroxide 400 MG/5ML suspension Commonly known as:  MILK OF MAGNESIA Take 30 mLs by mouth at bedtime as needed for mild constipation.   metoprolol tartrate 25 MG tablet Commonly known as:  LOPRESSOR Take 25 mg by mouth 2 (two) times daily.   Mintox 408-144-81 MG/5ML suspension Generic drug:  alum & mag hydroxide-simeth Take 30 mLs by mouth every 6 (six) hours as needed for indigestion or heartburn.   mirtazapine 15 MG disintegrating tablet Commonly known as:  REMERON SOL-TAB Take 15 mg by mouth at bedtime.   OLANZapine 5 MG tablet Commonly known as:  ZYPREXA Take 5 mg by mouth 2 (two) times daily.   OXYGEN Inhale 2 L into the lungs continuous.   polyethylene glycol 17 g  packet Commonly known as:  MIRALAX / GLYCOLAX Take 17 g by mouth daily.   tamsulosin 0.4 MG Caps capsule Commonly known as:  FLOMAX Take 1 capsule (0.4 mg total) by mouth daily after supper for 30 days.   traZODone 50 MG tablet Commonly known as:  DESYREL Take 50 mg by mouth at bedtime as needed for sleep.   trolamine salicylate 10 % cream Commonly known as:  ASPERCREME Apply 1 application topically 2 (two) times daily as needed for muscle pain.   vitamin C 500 MG tablet Commonly known as:  ASCORBIC ACID Take 500 mg by  mouth daily.       Allergies  Allergen Reactions  . Amoxapine And Related Nausea And Vomiting  . Amoxicillin Itching    Itching Details unknown, info from [facility]  . Ciprocinonide [Fluocinolone] Other (See Comments)    Pt unsure  . Ciprofloxacin Other (See Comments)    Pt unsure  . Doxycycline Nausea And Vomiting  . Erythromycin Hives  . Sulfa Antibiotics Other (See Comments)    Unknown-reaction as a child  . Zofran [Ondansetron Hcl] Nausea And Vomiting     Procedures/Studies:    Dg Chest 1 View  Result Date: 06/26/2018 CLINICAL DATA:  Fall. EXAM: CHEST  1 VIEW COMPARISON:  04/03/2018 FINDINGS: The heart size and mediastinal contours are within normal limits. There is no evidence of pulmonary edema, consolidation, pneumothorax, nodule or pleural fluid. There appear to be new fractures of the right third, fourth, fifth and sixth ribs demonstrating minimal angulation/displacement. IMPRESSION: New fractures of the right third, fourth, fifth and sixth ribs demonstrating minimal angulation and displacement. No evidence of associated pneumothorax or pleural fluid. Electronically Signed   By: Aletta Edouard M.D.   On: 06/26/2018 16:57   Dg Pelvis 1-2 Views  Result Date: 06/26/2018 CLINICAL DATA:  PER EMS, Pt lives ay [place] and fell in the bathroom and hit the back of her head. Pt also has lac to back of head . Pt reports pain to RT hip . EXAM: PELVIS -  1-2 VIEW COMPARISON:  CT, 09/19/2009. FINDINGS: Fracture of the right femoral neck, subcapital to mid femoral neck, nondisplaced and non comminuted, but with valgus angulation. No other fractures. No bone lesions. Hip joints, SI joints and symphysis pubis are normally spaced and aligned. Skeletal structures are demineralized. Soft tissues are unremarkable. IMPRESSION: 1. Nondisplaced, non comminuted, valgus angulated fracture of the right femoral neck. 2. No other fractures.  No dislocation. Electronically Signed   By: Lajean Manes M.D.   On: 06/26/2018 14:55   Ct Head Wo Contrast  Result Date: 06/26/2018 CLINICAL DATA:  Head trauma, headache. Neck pain. EXAM: CT HEAD WITHOUT CONTRAST CT CERVICAL SPINE WITHOUT CONTRAST TECHNIQUE: Multidetector CT imaging of the head and cervical spine was performed following the standard protocol without intravenous contrast. Multiplanar CT image reconstructions of the cervical spine were also generated. COMPARISON:  04/02/2018 FINDINGS: CT HEAD FINDINGS Brain: No evidence of acute infarction, hemorrhage, hydrocephalus, extra-axial collection or mass lesion/mass effect. Generalized atrophy. Hypoattenuation of white matter, likely chronic microvascular ischemic change. Chronic LEFT parietal infarct, significant volume loss. Vascular: Calcification of the cavernous internal carotid arteries consistent with cerebrovascular atherosclerotic disease. No signs of intracranial large vessel occlusion. Skull: Calvarium intact. Sinuses/Orbits: No layering sinus fluid. Negative orbits. Other: Large RIGHT parietal scalp hematoma. CT CERVICAL SPINE FINDINGS Alignment: Normal. Skull base and vertebrae: No acute fracture. No primary bone lesion or focal pathologic process. Soft tissues and spinal canal: No prevertebral fluid or swelling. No visible canal hematoma. BILATERAL carotid bifurcation calcification. Disc levels:  No traumatic or calcified disc herniation. Upper chest: Biapical scarring  and emphysematous change, greater on the RIGHT, without progression from January 2020 CT chest. Other: None. IMPRESSION: 1. No skull fracture or intracranial hemorrhage. Large RIGHT parietal scalp hematoma. 2. No cervical spine fracture or traumatic subluxation. Electronically Signed   By: Staci Righter M.D.   On: 06/26/2018 13:50   Ct Angio Chest Pe W And/or Wo Contrast  Result Date: 07/09/2018 CLINICAL DATA:  72 year old female with shortness of breath EXAM: CT ANGIOGRAPHY CHEST WITH  CONTRAST TECHNIQUE: Multidetector CT imaging of the chest was performed using the standard protocol during bolus administration of intravenous contrast. Multiplanar CT image reconstructions and MIPs were obtained to evaluate the vascular anatomy. CONTRAST:  85mL OMNIPAQUE IOHEXOL 350 MG/ML SOLN COMPARISON:  04/04/2018, 03/17/2012 FINDINGS: Cardiovascular: Heart: Borderline cardiomegaly. No pericardial fluid/thickening. Calcifications of left anterior descending, circumflex, right coronary artery. Aorta: Unremarkable course, caliber, contour of the thoracic aorta. No aneurysm or dissection flap. No periaortic fluid. Pulmonary arteries: There are a few central filling defects within the subsegmental vessels of right middle lobe, right lower lobe, left lower lobe. No associated lung changes/infarction. Mediastinum/Nodes: No mediastinal adenopathy. Unremarkable appearance of the thoracic esophagus. Unremarkable appearance of the thoracic inlet and thyroid. Lungs/Pleura: Advanced paraseptal and centrilobular emphysema. Platelike scarring at the bilateral lung apices, relatively unchanged dating to 03/17/2012. No confluent airspace disease, pneumothorax, or pleural effusion. No evidence of edema. No endotracheal or endobronchial debris or bronchial wall thickening. Upper Abdomen: No acute. Musculoskeletal: No acute displaced fracture. Partially healed fracture of the lower sternum. No bony canal narrowing. Changes of vertebral  augmentation of L1. Review of the MIP images confirms the above findings. IMPRESSION: CT is positive for a few bilateral subsegmental pulmonary emboli, with no evidence of lung infarction. These results were discussed by telephone at the time of interpretation on 07/09/2018 at 5:49 pm with Dr. Duffy Bruce. Advanced paraseptal and centrilobular emphysema. Emphysema (ICD10-J43.9). Aortic Atherosclerosis (ICD10-I70.0). Associated coronary artery disease. Electronically Signed   By: Corrie Mckusick D.O.   On: 07/09/2018 17:52   Ct Cervical Spine Wo Contrast  Result Date: 06/26/2018 CLINICAL DATA:  Head trauma, headache. Neck pain. EXAM: CT HEAD WITHOUT CONTRAST CT CERVICAL SPINE WITHOUT CONTRAST TECHNIQUE: Multidetector CT imaging of the head and cervical spine was performed following the standard protocol without intravenous contrast. Multiplanar CT image reconstructions of the cervical spine were also generated. COMPARISON:  04/02/2018 FINDINGS: CT HEAD FINDINGS Brain: No evidence of acute infarction, hemorrhage, hydrocephalus, extra-axial collection or mass lesion/mass effect. Generalized atrophy. Hypoattenuation of white matter, likely chronic microvascular ischemic change. Chronic LEFT parietal infarct, significant volume loss. Vascular: Calcification of the cavernous internal carotid arteries consistent with cerebrovascular atherosclerotic disease. No signs of intracranial large vessel occlusion. Skull: Calvarium intact. Sinuses/Orbits: No layering sinus fluid. Negative orbits. Other: Large RIGHT parietal scalp hematoma. CT CERVICAL SPINE FINDINGS Alignment: Normal. Skull base and vertebrae: No acute fracture. No primary bone lesion or focal pathologic process. Soft tissues and spinal canal: No prevertebral fluid or swelling. No visible canal hematoma. BILATERAL carotid bifurcation calcification. Disc levels:  No traumatic or calcified disc herniation. Upper chest: Biapical scarring and emphysematous change,  greater on the RIGHT, without progression from January 2020 CT chest. Other: None. IMPRESSION: 1. No skull fracture or intracranial hemorrhage. Large RIGHT parietal scalp hematoma. 2. No cervical spine fracture or traumatic subluxation. Electronically Signed   By: Staci Righter M.D.   On: 06/26/2018 13:50   Pelvis Portable  Result Date: 06/27/2018 CLINICAL DATA:  73 year old female with a history of right femoral neck fracture EXAM: PORTABLE PELVIS 1-2 VIEWS COMPARISON:  Preoperative radiographs 06/26/2018 FINDINGS: A single frontal view of the pelvis demonstrates interval surgical changes of right total hip arthroplasty. A cerclage wire encircles the intertrochanteric region of the femur. Aside from the 2 screws transfixing the acetabular cup, there is a third metallic fragment measuring 2.1 cm in length which resembles a drill bit. Expected subcutaneous emphysema in the soft tissues in the region of incision. IMPRESSION: Surgical changes  of right total hip arthroplasty as above. Please see operative note for further detail. Electronically Signed   By: Jacqulynn Cadet M.D.   On: 06/27/2018 13:41   Dg Chest Portable 1 View  Result Date: 07/09/2018 CLINICAL DATA:  Shortness of breath EXAM: PORTABLE CHEST 1 VIEW COMPARISON:  07/02/2018 FINDINGS: There is no focal consolidation. There is biapical scarring. There is no pneumothorax. There is a small right pleural effusion. The heart and mediastinal contours are unremarkable. There are multiple right posterolateral rib fractures involving the second, third, fourth, fifth, 6, seventh and eighth ribs. IMPRESSION: Trace right pleural effusion. Multiple right rib fractures again noted. Electronically Signed   By: Kathreen Devoid   On: 07/09/2018 15:52   Dg Chest Port 1 View  Result Date: 07/02/2018 CLINICAL DATA:  Hypoxia. Right rib fractures. EXAM: PORTABLE CHEST 1 VIEW COMPARISON:  Chest radiograph 06/26/2018 FINDINGS: Increasing hazy opacity at the right lung  base from prior exam. Overall lower lung volumes leading to bronchovascular crowding. No visualized pneumothorax. Unchanged heart size and mediastinal contours. Right rib fractures again seen. Bones are under mineralized. IMPRESSION: 1. Increasing hazy opacity at the right lung base from prior exam. This may be atelectasis, pleural fluid, pulmonary contusion or combination thereof given recent right rib fractures. No visualized pneumothorax. 2. Lower lung volumes from prior exam with bronchovascular crowding. Electronically Signed   By: Keith Rake M.D.   On: 07/02/2018 20:33   Dg Knee Right Port  Result Date: 06/26/2018 CLINICAL DATA:  Right knee pain after fall. EXAM: PORTABLE RIGHT KNEE - 1-2 VIEW COMPARISON:  None. FINDINGS: No evidence of fracture, dislocation, or joint effusion. No evidence of arthropathy or other focal bone abnormality. Vascular calcifications are noted. IMPRESSION: Negative. Electronically Signed   By: Marijo Conception, M.D.   On: 06/26/2018 16:50   Dg C-arm 1-60 Min  Result Date: 06/27/2018 CLINICAL DATA:  73 year old female with right hip fracture EXAM: DG C-ARM 61-120 MIN; OPERATIVE LEFT HIP WITH PELVIS COMPARISON:  06/26/2010 FINDINGS: Limited intraoperative fluoroscopic spot images during right hip arthroplasty. IMPRESSION: Intraoperative fluoroscopic spot images for right hip arthroplasty. Please refer to the dictated operative report for full details of intraoperative findings and procedure. Electronically Signed   By: Corrie Mckusick D.O.   On: 06/27/2018 12:15   Dg Hip Operative Unilat W Or W/o Pelvis Left  Result Date: 06/27/2018 CLINICAL DATA:  73 year old female with right hip fracture EXAM: DG C-ARM 61-120 MIN; OPERATIVE LEFT HIP WITH PELVIS COMPARISON:  06/26/2010 FINDINGS: Limited intraoperative fluoroscopic spot images during right hip arthroplasty. IMPRESSION: Intraoperative fluoroscopic spot images for right hip arthroplasty. Please refer to the dictated  operative report for full details of intraoperative findings and procedure. Electronically Signed   By: Corrie Mckusick D.O.   On: 06/27/2018 12:15   Dg Femur, Min 2 Views Right  Result Date: 06/26/2018 CLINICAL DATA:  PER EMS, Pt lives ay [place] and fell in the bathroom and hit the back of her head. Pt also has lac to back of head . Pt reports pain to RT hip . EXAM: RIGHT FEMUR 2 VIEWS COMPARISON:  None. FINDINGS: Non comminuted fracture of the right femoral neck. No significant fracture displacement. There is valgus fracture angulation. No other fractures.  Hip and knee joints are normally aligned. Soft tissues are unremarkable. IMPRESSION: 1. Nondisplaced, non comminuted, valgus angulated fracture of the right femoral neck. 2. No other fractures.  No dislocation. Electronically Signed   By: Dedra Skeens.D.  On: 06/26/2018 14:54   Vas [place] Lower Extremity Venous (dvt)  Result Date: 07/10/2018  Lower Venous Study Indications: Pulmonary embolism.  Performing Technologist: Abram Sander RVS  Examination Guidelines: A complete evaluation includes B-mode imaging, spectral Doppler, color Doppler, and power Doppler as needed of all accessible portions of each vessel. Bilateral testing is considered an integral part of a complete examination. Limited examinations for reoccurring indications may be performed as noted.  +---------+---------------+---------+-----------+----------+--------------+ RIGHT    CompressibilityPhasicitySpontaneityPropertiesSummary        +---------+---------------+---------+-----------+----------+--------------+ CFV      Full           Yes      Yes                                 +---------+---------------+---------+-----------+----------+--------------+ SFJ      Full                                                        +---------+---------------+---------+-----------+----------+--------------+ FV Prox  Full                                                         +---------+---------------+---------+-----------+----------+--------------+ FV Mid   Full                                                        +---------+---------------+---------+-----------+----------+--------------+ FV DistalFull                                                        +---------+---------------+---------+-----------+----------+--------------+ PFV      Full                                                        +---------+---------------+---------+-----------+----------+--------------+ POP      Full           Yes      Yes                                 +---------+---------------+---------+-----------+----------+--------------+ PTV      Full                                                        +---------+---------------+---------+-----------+----------+--------------+ PERO  Not visualized +---------+---------------+---------+-----------+----------+--------------+   +---------+---------------+---------+-----------+----------+--------------+ LEFT     CompressibilityPhasicitySpontaneityPropertiesSummary        +---------+---------------+---------+-----------+----------+--------------+ CFV      Full           Yes      Yes                                 +---------+---------------+---------+-----------+----------+--------------+ SFJ      Full                                                        +---------+---------------+---------+-----------+----------+--------------+ FV Prox  Full                                                        +---------+---------------+---------+-----------+----------+--------------+ FV Mid   Full                                                        +---------+---------------+---------+-----------+----------+--------------+ FV DistalFull                                                         +---------+---------------+---------+-----------+----------+--------------+ PFV      Full                                                        +---------+---------------+---------+-----------+----------+--------------+ POP      Full           Yes      Yes                                 +---------+---------------+---------+-----------+----------+--------------+ PTV                                                   Not visualized +---------+---------------+---------+-----------+----------+--------------+ PERO                                                  Not visualized +---------+---------------+---------+-----------+----------+--------------+     Summary: Right: There is no evidence of deep vein thrombosis in the lower extremity. However, portions of this examination were limited- see technologist comments above. No cystic structure found in the popliteal fossa. Left: There is no evidence of deep vein thrombosis in the lower extremity. However, portions of  this examination were limited- see technologist comments above. No cystic structure found in the popliteal fossa.  *See table(s) above for measurements and observations. Electronically signed by Monica Martinez MD on 07/10/2018 at 4:19:41 PM.    Final      The results of significant diagnostics from this hospitalization (including imaging, microbiology, ancillary and laboratory) are listed below for reference.     Microbiology: Recent Results (from the past 240 hour(s))  SARS Coronavirus 2 Operating Room Services order, Performed in [facility] hospital lab)     Status: None   Collection Time: 07/23/18  5:45 PM  Result Value Ref Range Status   SARS Coronavirus 2 NEGATIVE NEGATIVE Final    Comment: (NOTE) If result is NEGATIVE SARS-CoV-2 target nucleic acids are NOT DETECTED. The SARS-CoV-2 RNA is generally detectable in upper and lower  respiratory specimens during the acute phase of infection. The lowest  concentration of  SARS-CoV-2 viral copies this assay can detect is 250  copies / mL. A negative result does not preclude SARS-CoV-2 infection  and should not be used as the sole basis for treatment or other  patient management decisions.  A negative result may occur with  improper specimen collection / handling, submission of specimen other  than nasopharyngeal swab, presence of viral mutation(s) within the  areas targeted by this assay, and inadequate number of viral copies  (<250 copies / mL). A negative result must be combined with clinical  observations, patient history, and epidemiological information. If result is POSITIVE SARS-CoV-2 target nucleic acids are DETECTED. The SARS-CoV-2 RNA is generally detectable in upper and lower  respiratory specimens dur ing the acute phase of infection.  Positive  results are indicative of active infection with SARS-CoV-2.  Clinical  correlation with patient history and other diagnostic information is  necessary to determine patient infection status.  Positive results do  not rule out bacterial infection or co-infection with other viruses. If result is PRESUMPTIVE POSTIVE SARS-CoV-2 nucleic acids MAY BE PRESENT.   A presumptive positive result was obtained on the submitted specimen  and confirmed on repeat testing.  While 2019 novel coronavirus  (SARS-CoV-2) nucleic acids may be present in the submitted sample  additional confirmatory testing may be necessary for epidemiological  and / or clinical management purposes  to differentiate between  SARS-CoV-2 and other Sarbecovirus currently known to infect humans.  If clinically indicated additional testing with an alternate test  methodology [id number]) is advised. The SARS-CoV-2 RNA is generally  detectable in upper and lower respiratory sp ecimens during the acute  phase of infection. The expected result is Negative. Fact Sheet for Patients:  StrictlyIdeas.no Fact Sheet for Healthcare  Providers: BankingDealers.co.za This test is not yet approved or cleared by the [place] FDA and has been authorized for detection and/or diagnosis of SARS-CoV-2 by FDA under an Emergency Use Authorization (EUA).  This EUA will remain in effect (meaning this test can be used) for the duration of the COVID-19 declaration under Section 564(b)(1) of the Act, 21 U.S.C. section 360bbb-3(b)(1), unless the authorization is terminated or revoked sooner. Performed at [facility], [place] [address], [place], [place] [address]      Labs: BNP (last 3 results) Recent Labs    07/09/18 1524  BNP 811.9*   Basic Metabolic Panel: Recent Labs  Lab 07/23/18 1345 07/24/18 0216  NA 141 138  K 3.8 4.0  CL 107 106  CO2 23 22  GLUCOSE 103* 81  BUN 10 10  CREATININE 0.67  0.62  CALCIUM 8.2* 8.3*   Liver Function Tests: Recent Labs  Lab 07/23/18 1345  AST 20  ALT 15  ALKPHOS 155*  BILITOT 0.4  PROT 5.1*  ALBUMIN 2.4*   Recent Labs  Lab 07/23/18 1345  LIPASE 33   No results for input(s): AMMONIA in the last 168 hours. CBC: Recent Labs  Lab 07/23/18 1345 07/24/18 0216  WBC 9.5 14.2*  NEUTROABS 6.9  --   HGB 4.9* 9.7*  HCT 16.9* 30.0*  MCV 104.3* 88.0  PLT 661* 488*   Cardiac Enzymes: No results for input(s): CKTOTAL, CKMB, CKMBINDEX, TROPONINI in the last 168 hours. BNP: Invalid input(s): POCBNP CBG: No results for input(s): GLUCAP in the last 168 hours. D-Dimer No results for input(s): DDIMER in the last 72 hours. Hgb A1c No results for input(s): HGBA1C in the last 72 hours. Lipid Profile No results for input(s): CHOL, HDL, LDLCALC, TRIG, CHOLHDL, LDLDIRECT in the last 72 hours. Thyroid function studies No results for input(s): TSH, T4TOTAL, T3FREE, THYROIDAB in the last 72 hours.  Invalid input(s): FREET3 Anemia work up No results for input(s): VITAMINB12, FOLATE, FERRITIN, TIBC, IRON, RETICCTPCT in the last 72 hours. Urinalysis     Component Value Date/Time   COLORURINE YELLOW 06/27/2018 0139   APPEARANCEUR HAZY (A) 06/27/2018 0139   LABSPEC 1.013 06/27/2018 0139   PHURINE 5.0 06/27/2018 0139   GLUCOSEU NEGATIVE 06/27/2018 0139   HGBUR SMALL (A) 06/27/2018 0139   BILIRUBINUR NEGATIVE 06/27/2018 0139   KETONESUR 5 (A) 06/27/2018 0139   PROTEINUR NEGATIVE 06/27/2018 0139   UROBILINOGEN 0.2 10/26/2014 1503   NITRITE NEGATIVE 06/27/2018 0139   LEUKOCYTESUR TRACE (A) 06/27/2018 0139   Sepsis Labs Invalid input(s): PROCALCITONIN,  WBC,  [place] [facility] Recent Results (from the past 240 hour(s))  SARS Coronavirus 2 [facility] order, Performed in [place] hospital lab)     Status: None   Collection Time: 07/23/18  5:45 PM  Result Value Ref Range Status   SARS Coronavirus 2 NEGATIVE NEGATIVE Final    Comment: (NOTE) If result is NEGATIVE SARS-CoV-2 target nucleic acids are NOT DETECTED. The SARS-CoV-2 RNA is generally detectable in upper and lower  respiratory specimens during the acute phase of infection. The lowest  concentration of SARS-CoV-2 viral copies this assay can detect is 250  copies / mL. A negative result does not preclude SARS-CoV-2 infection  and should not be used as the sole basis for treatment or other  patient management decisions.  A negative result may occur with  improper specimen collection / handling, submission of specimen other  than nasopharyngeal swab, presence of viral mutation(s) within the  areas targeted by this assay, and inadequate number of viral copies  (<250 copies / mL). A negative result must be combined with clinical  observations, patient history, and epidemiological information. If result is POSITIVE SARS-CoV-2 target nucleic acids are DETECTED. The SARS-CoV-2 RNA is generally detectable in upper and lower  respiratory specimens dur ing the acute phase of infection.  Positive  results are indicative of active infection with SARS-CoV-2.  Clinical   correlation with patient history and other diagnostic information is  necessary to determine patient infection status.  Positive results do  not rule out bacterial infection or co-infection with other viruses. If result is PRESUMPTIVE POSTIVE SARS-CoV-2 nucleic acids MAY BE PRESENT.   A presumptive positive result was obtained on the submitted specimen  and confirmed on repeat testing.  While 2019 novel coronavirus  (SARS-CoV-2) nucleic acids may be present  in the submitted sample  additional confirmatory testing may be necessary for epidemiological  and / or clinical management purposes  to differentiate between  SARS-CoV-2 and other Sarbecovirus currently known to infect humans.  If clinically indicated additional testing with an alternate test  methodology [id number]) is advised. The SARS-CoV-2 RNA is generally  detectable in upper and lower respiratory sp ecimens during the acute  phase of infection. The expected result is Negative. Fact Sheet for Patients:  StrictlyIdeas.no Fact Sheet for Healthcare Providers: BankingDealers.co.za This test is not yet approved or cleared by the [place] FDA and has been authorized for detection and/or diagnosis of SARS-CoV-2 by FDA under an Emergency Use Authorization (EUA).  This EUA will remain in effect (meaning this test can be used) for the duration of the COVID-19 declaration under Section 564(b)(1) of the Act, 21 U.S.C. section 360bbb-3(b)(1), unless the authorization is terminated or revoked sooner. Performed at [facility], [place] [address] [place], [place] [address]      Time coordinating discharge in minutes: 65  SIGNED:   Debbe Odea, MD  [facility] 07/25/2018, 10:38 AM Pager   If 7PM-7AM, please contact night-coverage [email or web address] Password [email or web address]

## 2018-07-24 NOTE — NC FL2 (Signed)
[place] LEVEL OF CARE SCREENING TOOL     IDENTIFICATION  Patient Name: [name] Birthdate: 05-Jul-1945 Sex: female Admission Date (Current Location): 07/23/2018  [facility] and [place] Number:  Herbalist and Address:  [facility] [place], [place] [address], [place], [place] [address]      Provider Number: [id number]  Attending Physician Name and Address:  Debbe Odea, MD  Relative Name and Phone Number:  [name] (daughter) [phone]    Current Level of Care: Hospital Recommended Level of Care: [place] Prior Approval Number:    Date Approved/Denied:   PASRR Number: [id number]  Discharge Plan: SNF    Current Diagnoses: Patient Active Problem List   Diagnosis Date Noted  . Acute blood loss anemia 07/23/2018  . Pulmonary embolism ([place]) 07/23/2018  . H/O: CVA (cerebrovascular accident) 07/23/2018  . Symptomatic anemia 07/09/2018  . Acute blood loss as cause of postoperative anemia 06/28/2018  . Displaced fracture of right femoral neck ([place]) 06/27/2018  . Multiple fractures of ribs, right side, initial encounter for closed fracture 06/26/2018  . Fracture of femoral neck, right, closed ([place]) 06/26/2018  . Hematoma of right parietal scalp 06/26/2018  . Leukocytosis 06/26/2018  . Inadequate pain control 06/26/2018  . Hip fracture ([place]) 06/26/2018  . AKI (acute kidney injury) ([place]) 04/04/2018  . Hypoxia 04/04/2018  . Probable sepsis 04/04/2018  . Vascular dementia without behavioral disturbance ([place]) 06/30/2016  . Disease ruled out after examination   . Aphasia 05/15/2016  . Atherosclerosis of left carotid artery   . Cerebral embolism with cerebral infarction 02/27/2016  . Acute encephalopathy 02/26/2016  . Osteoporosis 07/04/2015  . Impaired cognition 03/30/2014  . Vitamin D deficiency 08/18/2013  . Hyperlipidemia 08/18/2013  . Prediabetes 08/18/2013  . Hospice care patient 08/18/2013  . CKD (chronic  kidney disease) stage 3, GFR 30-59 ml/min ([name]) 07/01/2013  . Anemia of chronic disease 07/01/2013  . Depression, major, in remission ([place]) 06/30/2013  . Essential hypertension 02/09/2013  . COPD GOLD III 03/28/2012  . Vocal cord dysfunction 03/28/2012  . Carotid artery aneurysm ([place]) 03/04/2012  . Smoker 03/04/2012    Orientation RESPIRATION BLADDER Height & Weight     Self  O2(nasal cannula 2L/min) Incontinent, External catheter Weight: 100 lb 8.5 oz (45.6 kg) Height:  (unknown height)  BEHAVIORAL SYMPTOMS/MOOD NEUROLOGICAL BOWEL NUTRITION STATUS      Continent Diet(see discharge summary)  AMBULATORY STATUS COMMUNICATION OF NEEDS Skin   Extensive Assist Verbally Other (Comment), Skin abrasions(abrasion right head, blister right heel, ecchymosis posterior head, right hip closed surgical incision, open wound head laceration)                       Personal Care Assistance Level of Assistance  Bathing, Feeding, Dressing, Total care Bathing Assistance: Maximum assistance Feeding assistance: Maximum assistance Dressing Assistance: Maximum assistance Total Care Assistance: Maximum assistance   Functional Limitations Info  Sight, Hearing, Speech Sight Info: Adequate Hearing Info: Impaired Speech Info: Adequate    SPECIAL CARE FACTORS FREQUENCY  PT (By licensed PT), OT (By licensed OT)     PT Frequency: min 5x weekly OT Frequency: min 5x weekly            Contractures Contractures Info: Not present    Additional Factors Info  Code Status, Allergies Code Status Info: DNR Allergies Info: Amoxapine and related, Amoxicillin, Ciprocinonide (fluocinolone), Ciprofloxacin, Doxycyclin, Erythromycin, Sulfa Antibiotics, Zofran (ondanestron Hcl)  Current Medications (07/24/2018):  This is the current hospital active medication list Current Facility-Administered Medications  Medication Dose Route Frequency Provider Last Rate Last Dose  . acetaminophen (TYLENOL)  tablet 650 mg  650 mg Oral Q6H PRN Karmen Bongo, MD       Or  . acetaminophen (TYLENOL) suppository 650 mg  650 mg Rectal Q6H PRN Karmen Bongo, MD      . aspirin EC tablet 81 mg  81 mg Oral BID Karmen Bongo, MD   81 mg at 07/24/18 4917  . atorvastatin (LIPITOR) tablet 20 mg  20 mg Oral q1800 Karmen Bongo, MD      . collagenase (SANTYL) ointment   Topical Daily Debbe Odea, MD      . gabapentin (NEURONTIN) capsule 300 mg  300 mg Oral Ivery Quale, MD   300 mg at 07/23/18 2254  . LORazepam (ATIVAN) tablet 0.5 mg  0.5 mg Oral Q4H PRN Karmen Bongo, MD      . losartan (COZAAR) tablet 100 mg  100 mg Oral Daily Karmen Bongo, MD   100 mg at 07/24/18 0941  . metoprolol tartrate (LOPRESSOR) tablet 25 mg  25 mg Oral BID Karmen Bongo, MD   25 mg at 07/24/18 9150  . mirtazapine (REMERON SOL-TAB) disintegrating tablet 15 mg  15 mg Oral QHS Karmen Bongo, MD   15 mg at 07/23/18 2255  . OLANZapine (ZYPREXA) tablet 5 mg  5 mg Oral BID Karmen Bongo, MD   5 mg at 07/24/18 5697  . pantoprazole (PROTONIX) EC tablet 40 mg  40 mg Oral BID Karmen Bongo, MD   40 mg at 07/24/18 9480  . sodium chloride flush (NS) 0.9 % injection 10-40 mL  10-40 mL Intracatheter Q12H Isla Pence, MD   10 mL at 07/23/18 2255  . sodium chloride flush (NS) 0.9 % injection 10-40 mL  10-40 mL Intracatheter PRN Isla Pence, MD      . tamsulosin [facility]) capsule 0.4 mg  0.4 mg Oral QPC supper Karmen Bongo, MD      . traZODone (DESYREL) tablet 50 mg  50 mg Oral QHS PRN Karmen Bongo, MD         Discharge Medications: Please see discharge summary for a list of discharge medications.  Relevant Imaging Results:  Relevant Lab Results:   Additional Information SSN: [id number]  [name], LCSW

## 2018-07-24 NOTE — Progress Notes (Signed)
Manufacturing engineer [facility]) Hospice  Received referral from physician late yesterday inquiring about hospice services for pt.  MD was informed she was a hospice pt, but not sure with who.  Pt is with [facility] [phone]) in [place].  I spoke with them this morning to let them know their pt had been admitted to the hospital.  [facility] will follow up with hospital.  Please call with any questions/concerns. Venia Carbon RN, BSN, [facility] Liaison [phone]

## 2018-07-25 DIAGNOSIS — Z8673 Personal history of transient ischemic attack (TIA), and cerebral infarction without residual deficits: Secondary | ICD-10-CM | POA: Diagnosis not present

## 2018-07-25 DIAGNOSIS — I2699 Other pulmonary embolism without acute cor pulmonale: Secondary | ICD-10-CM

## 2018-07-25 DIAGNOSIS — D649 Anemia, unspecified: Secondary | ICD-10-CM | POA: Diagnosis present

## 2018-07-25 DIAGNOSIS — R5381 Other malaise: Secondary | ICD-10-CM | POA: Diagnosis not present

## 2018-07-25 DIAGNOSIS — D62 Acute posthemorrhagic anemia: Secondary | ICD-10-CM

## 2018-07-25 DIAGNOSIS — Z86711 Personal history of pulmonary embolism: Secondary | ICD-10-CM | POA: Diagnosis not present

## 2018-07-25 DIAGNOSIS — Z96641 Presence of right artificial hip joint: Secondary | ICD-10-CM | POA: Diagnosis present

## 2018-07-25 DIAGNOSIS — Z66 Do not resuscitate: Secondary | ICD-10-CM | POA: Diagnosis present

## 2018-07-25 DIAGNOSIS — J449 Chronic obstructive pulmonary disease, unspecified: Secondary | ICD-10-CM | POA: Diagnosis present

## 2018-07-25 DIAGNOSIS — N183 Chronic kidney disease, stage 3 (moderate): Secondary | ICD-10-CM | POA: Diagnosis not present

## 2018-07-25 DIAGNOSIS — Z7401 Bed confinement status: Secondary | ICD-10-CM | POA: Diagnosis not present

## 2018-07-25 DIAGNOSIS — D500 Iron deficiency anemia secondary to blood loss (chronic): Secondary | ICD-10-CM | POA: Diagnosis not present

## 2018-07-25 DIAGNOSIS — I10 Essential (primary) hypertension: Secondary | ICD-10-CM | POA: Diagnosis not present

## 2018-07-25 DIAGNOSIS — F0150 Vascular dementia without behavioral disturbance: Secondary | ICD-10-CM | POA: Diagnosis present

## 2018-07-25 DIAGNOSIS — K922 Gastrointestinal hemorrhage, unspecified: Secondary | ICD-10-CM | POA: Diagnosis present

## 2018-07-25 DIAGNOSIS — Z20828 Contact with and (suspected) exposure to other viral communicable diseases: Secondary | ICD-10-CM | POA: Diagnosis present

## 2018-07-25 DIAGNOSIS — Z515 Encounter for palliative care: Secondary | ICD-10-CM | POA: Diagnosis not present

## 2018-07-25 DIAGNOSIS — M2550 Pain in unspecified joint: Secondary | ICD-10-CM | POA: Diagnosis not present

## 2018-07-25 NOTE — Progress Notes (Signed)
[place] 17M-08 [facility] Horticulturist, commercial Note  Received request from [place] for family interest in [place] with request to transfer today. Chart reviewed and received report from bedside RN. Spoke with pt's daughter [name] to confirm interest and explain services. Family agreeable to transfer today. Caryl Pina, [place] aware. Registration paper work completed . Dr. Orpah Melter to assume care per family request.   Please fax discharge summary to [phone].  RN please call report to [phone].  Please arrange transport for patient to arrive as early in day as possible.  Thank you, Margaretmary Eddy, RN, BSN [facility] Liaison [phone]  [place] are on [place]

## 2018-07-25 NOTE — TOC Transition Note (Signed)
Transition of Care [facility]) - CM/SW Discharge Note   Patient Details  Name: [name] MRN: [id number] Date of Birth: 16-Oct-1945  Transition of Care [facility]) CM/SW Contact:  [name], LCSW Phone Number: 07/25/2018, 12:21 PM   Clinical Narrative:     Patient will DC to: [facility] Anticipated DC date: 07/25/2018 Family notified:Michelle (daughter) Transport [id number]  Per MD patient ready for DC to [facility] . RN, patient, patient's family, and facility notified of DC. Discharge Summary sent to facility. RN given number for report  [phone]. DC packet on chart. Ambulance transport requested for patient.  CSW signing off.  [place], [place]   Final next level of care: [place] Barriers to Discharge: No Barriers Identified   Patient Goals and CMS Choice   CMS Medicare.gov Compare Post Acute Care list provided to:: Patient Represenative (must comment)(Michelle (daughter)) Choice offered to / list presented to : Adult Children(Michelle (patient's daughter))  Discharge Placement              Patient chooses bed at: Other - please specify in the comment section below:([facility]) Patient to be transferred to facility by: Anon Raices Name of family member notified: [name] (daughter) Patient and family notified of of transfer: 07/25/18  Discharge Plan and Services In-house Referral: NA Discharge Planning Services: NA Post Acute Care Choice: [place]          DME Arranged: N/A DME Agency: NA       HH Arranged: NA HH Agency: NA        Social Determinants of Health (SDOH) Interventions     Readmission Risk Interventions Readmission Risk Prevention Plan 06/27/2018  Transportation Screening Complete  PCP or Specialist Appt within 3-5 Days Complete  HRI or [place] Complete  Social Work Consult for [place] Planning/Counseling Complete  Palliative Care Screening Complete  Medication Review Human resources officer) Complete  Some recent data might be hidden

## 2018-07-25 NOTE — Progress Notes (Signed)
DISCHARGE NOTE [facility]F [facility] to be discharged [place]  per MD order. Patient verbalized understanding.  Skin clean, dry and intact without evidence of skin break down, no evidence of skin tears noted. IV catheter discontinued intact. Site without signs and symptoms of complications. Dressing and pressure applied. Pt denies pain at the site currently. No complaints noted.  Patient free of lines, drains, and wounds.   Discharge packet assembled. An After Visit Summary (AVS) was printed and given to the EMS personnel. Patient escorted via stretcher and discharged to [facility] via ambulance. Report called to accepting facility; all questions and concerns addressed.   Arlyss Repress, RN

## 2018-07-25 NOTE — Progress Notes (Signed)
The patient did not discharge yesterday due to disposition being undetermined. She will be going to residential hospice today. She is stable for discharge. Please see my d/c summary from yesterday.   Debbe Odea, MD

## 2018-07-25 NOTE — Progress Notes (Signed)
CSW received call from [facility], currently reviewing patient's chart to see if she can come today.   [place], [place]

## 2018-08-25 DEATH — deceased

## 2020-10-17 IMAGING — DX CHEST  1 VIEW
1 series · 1 of 1 positions shown · non-contrast
Comparison: 04/03/2018

CLINICAL DATA: Fall.

EXAM:
CHEST  1 VIEW

[chest ap]
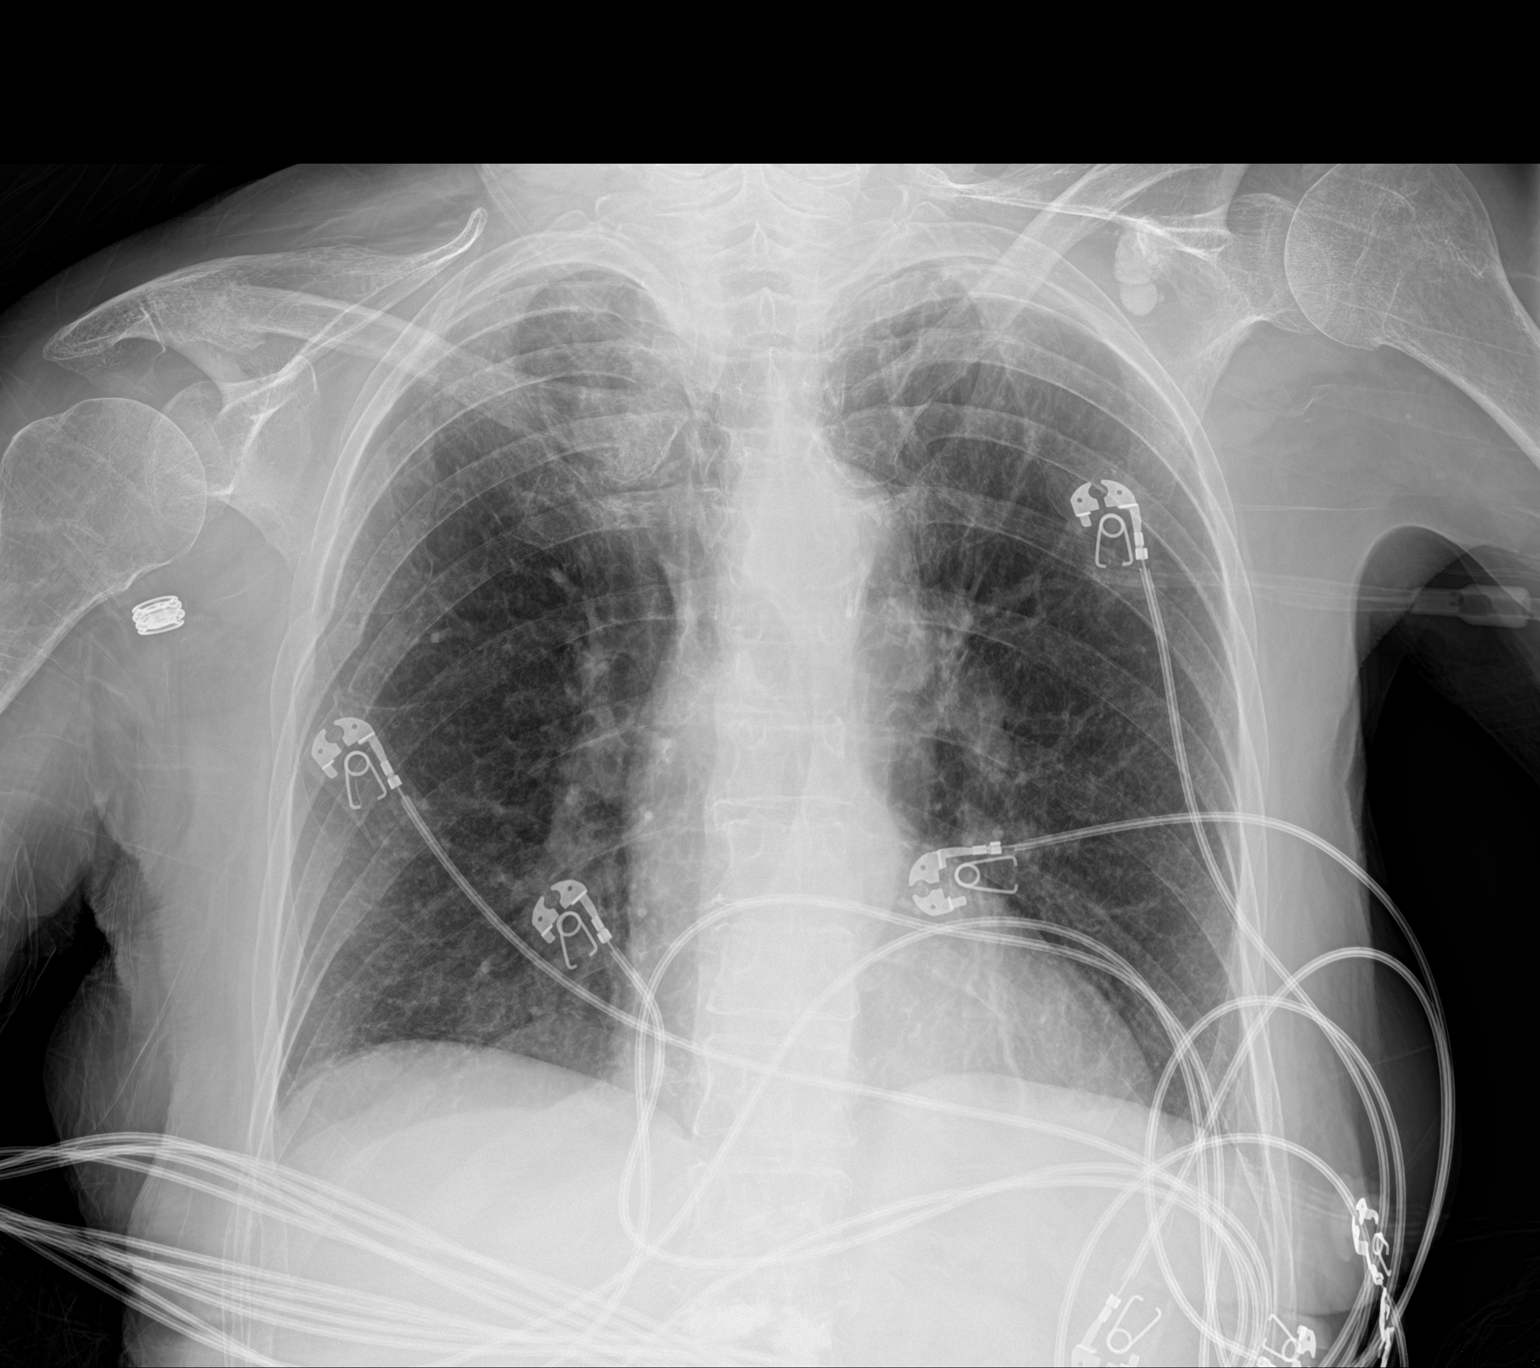

[1 of 1 positions shown; findings below may reference images not displayed]

FINDINGS: The heart size and mediastinal contours are within normal limits.
There is no evidence of pulmonary edema, consolidation,
pneumothorax, nodule or pleural fluid. There appear to be new
fractures of the right third, fourth, fifth and sixth ribs
demonstrating minimal angulation/displacement.
IMPRESSION: New fractures of the right third, fourth, fifth and sixth ribs
demonstrating minimal angulation and displacement. No evidence of
associated pneumothorax or pleural fluid.

## 2020-10-17 IMAGING — CT CT CERVICAL SPINE WITHOUT CONTRAST
3 of 4 series · 14 of 33 positions shown, 17 images · non-contrast
Comparison: 04/02/2018

CLINICAL DATA: Head trauma, headache. Neck pain.

EXAM:
CT HEAD WITHOUT CONTRAST
CT CERVICAL SPINE WITHOUT CONTRAST
TECHNIQUE: Multidetector CT imaging of the head and cervical spine was
performed following the standard protocol without intravenous
contrast. Multiplanar CT image reconstructions of the cervical spine
were also generated.

[Series 5: c_spine 2.0 st · axial · 0.31mm/px · z∈[-198,-66]mm · 6 of 94 slices shown, 8 images]
[im 14/94  soft-tissue]
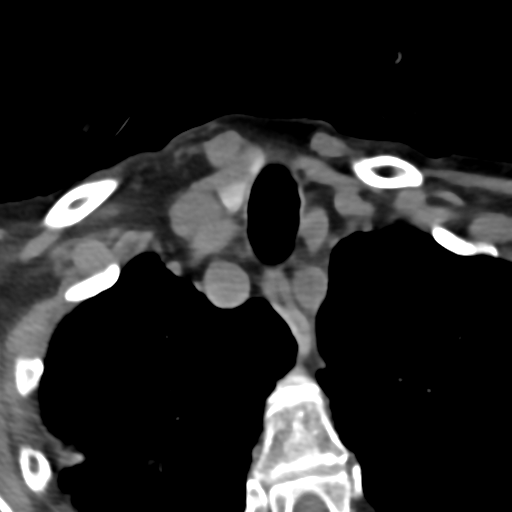
[im 14/94  bone]
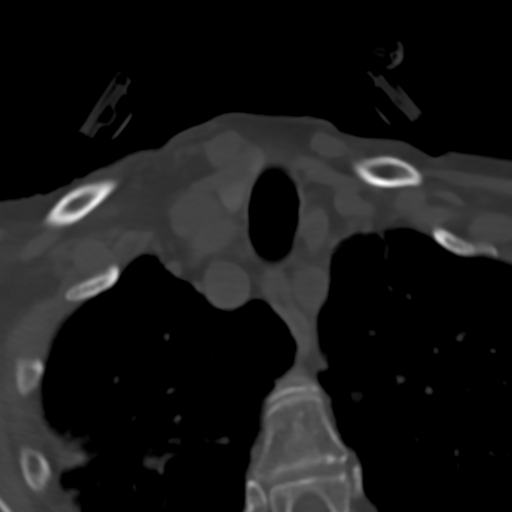
[im 27/94  bone]
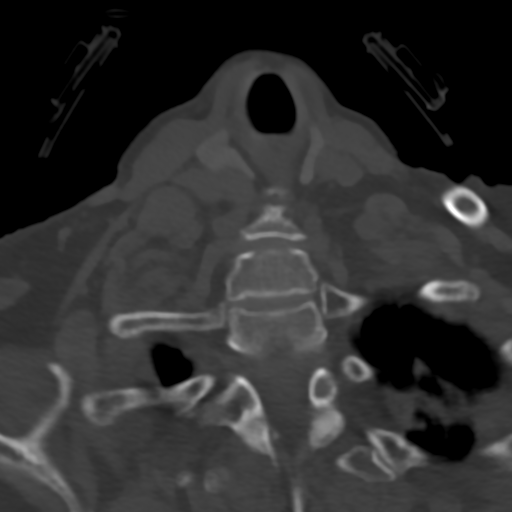
[im 40/94  bone]
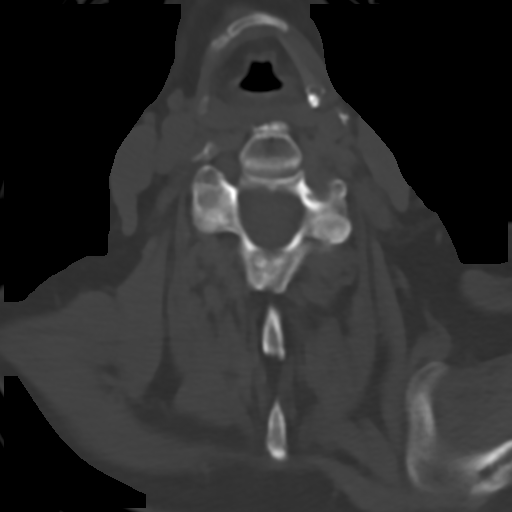
[im 54/94  bone]
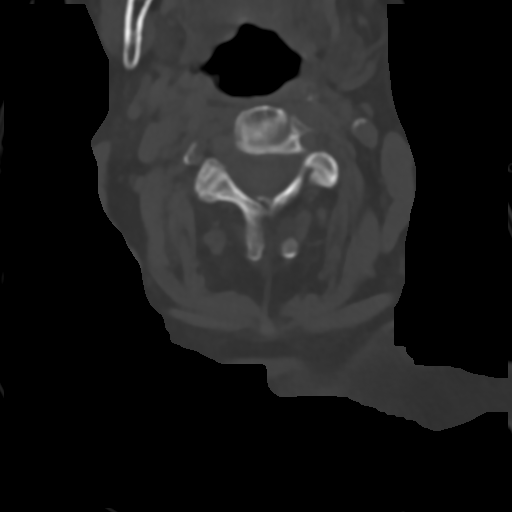
[im 67/94  soft-tissue]
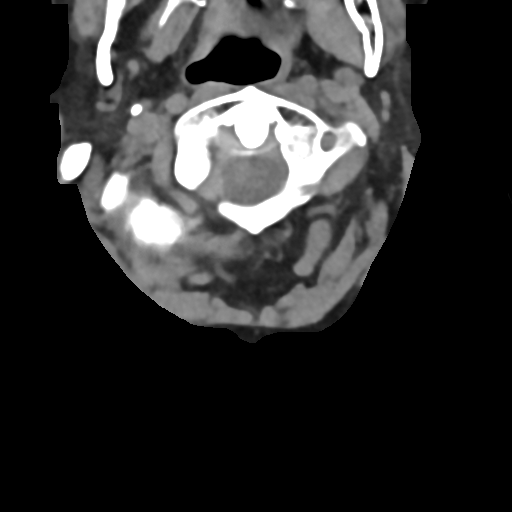
[im 67/94  bone]
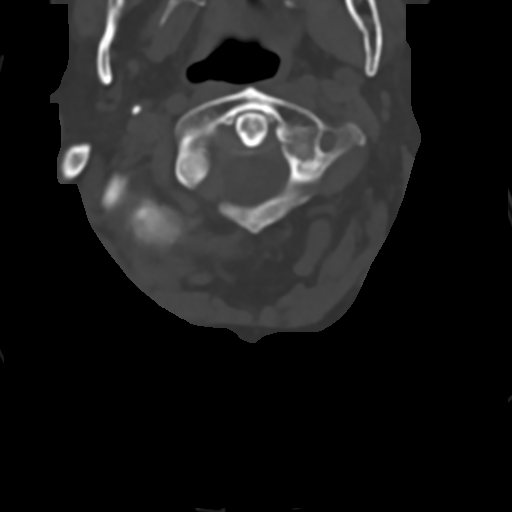
[im 80/94  bone]
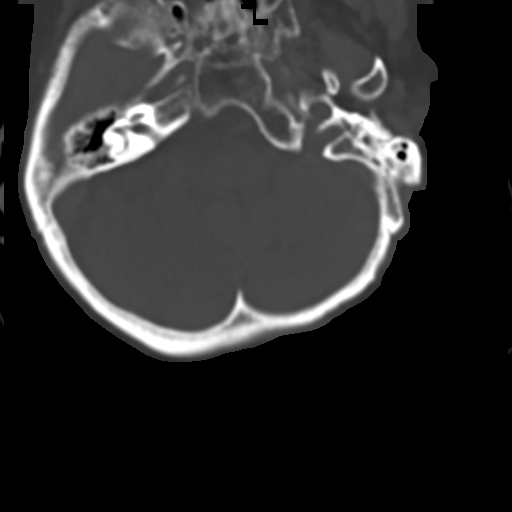

[Series 7: c_spine 2.0 sag bone · sagittal · 0.35mm/px · 5 of 61 slices shown, 6 images]
[im 21/61  bone]
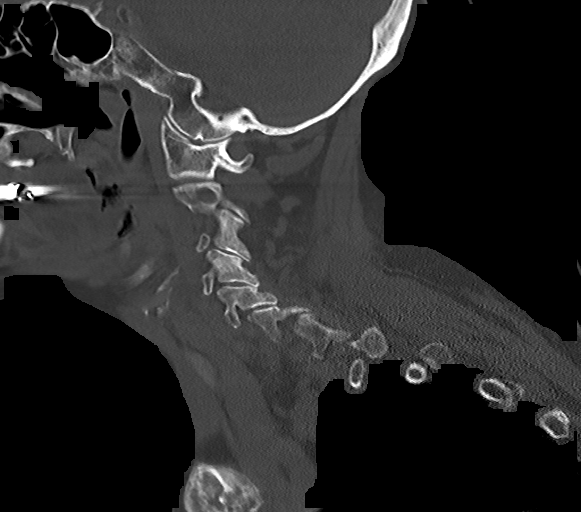
[im 26/61  bone]
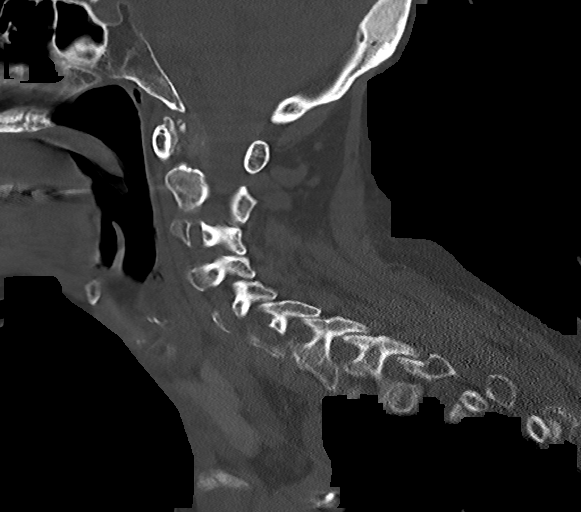
[im 31/61  soft-tissue]
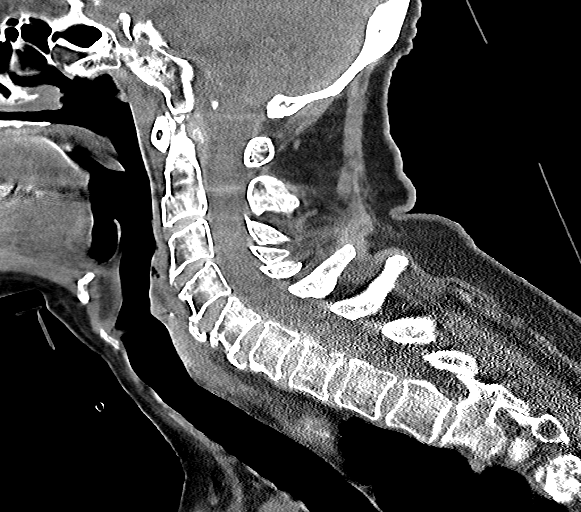
[im 31/61  bone]
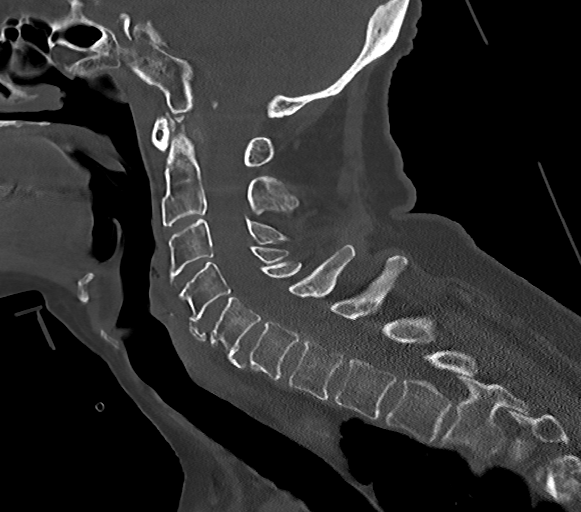
[im 36/61  bone]
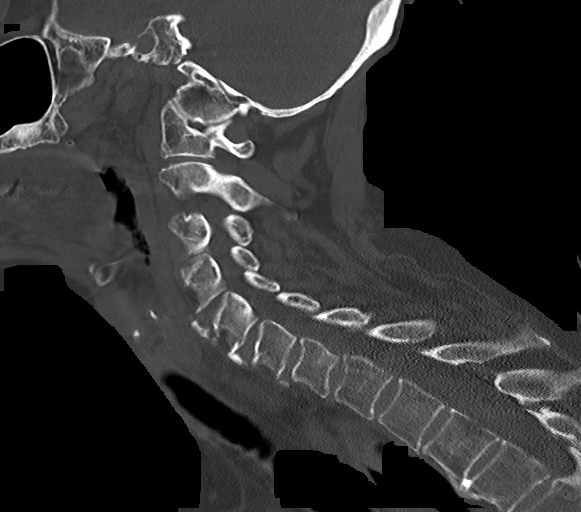
[im 41/61  bone]
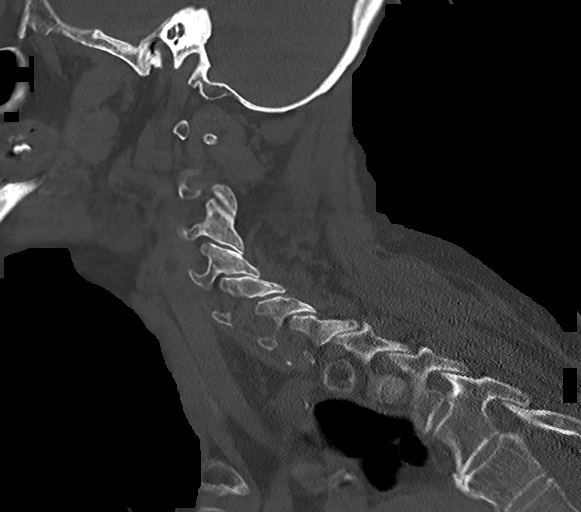

[Series 8: c_spine 2.0 cor bone · coronal · 0.32mm/px · 3 of 69 slices shown]
[im 14/69  bone]
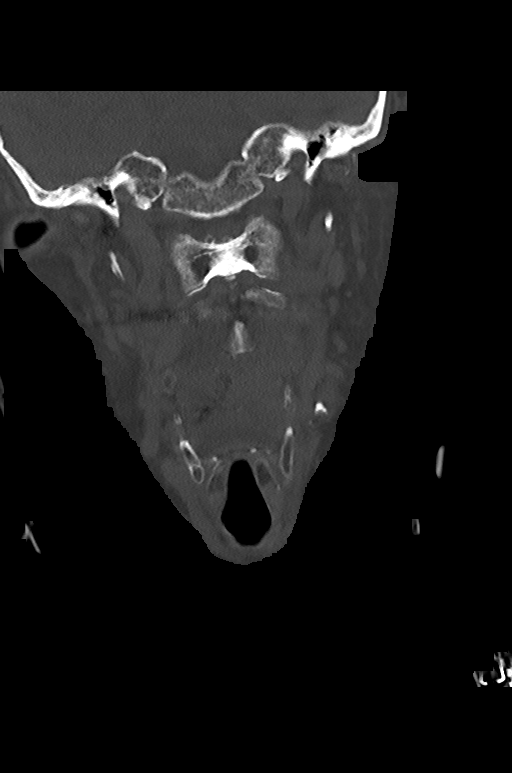
[im 28/69  bone]
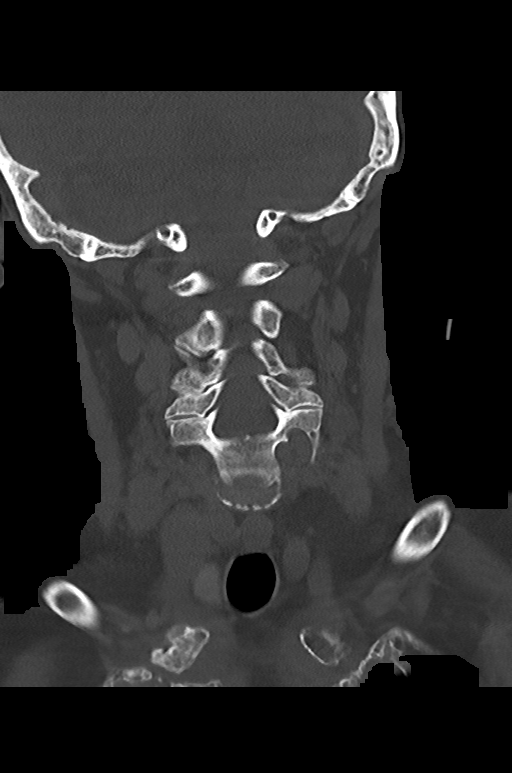
[im 41/69  bone]
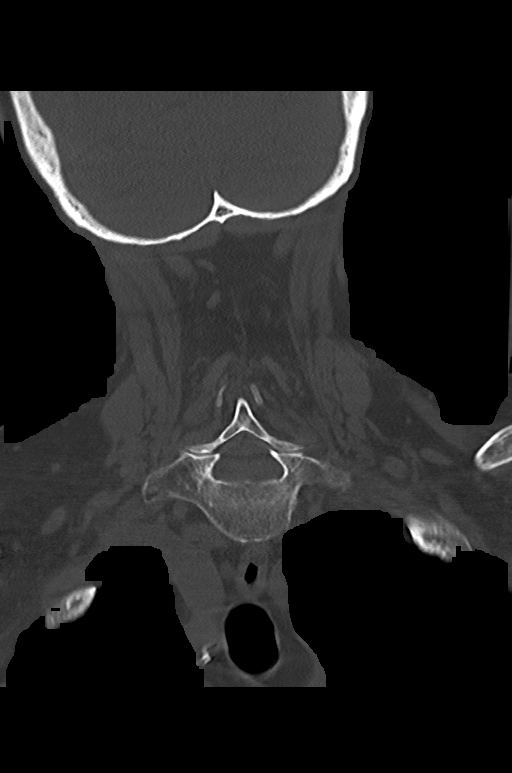

[14 of 33 positions shown; findings below may reference images not displayed]

FINDINGS: CT HEAD FINDINGS

Brain: No evidence of acute infarction, hemorrhage, hydrocephalus,
extra-axial collection or mass lesion/mass effect. Generalized
atrophy. Hypoattenuation of white matter, likely chronic
microvascular ischemic change. Chronic LEFT parietal infarct,
significant volume loss.

Vascular: Calcification of the cavernous internal carotid arteries
consistent with cerebrovascular atherosclerotic disease. No signs of
intracranial large vessel occlusion.

Skull: Calvarium intact.

Sinuses/Orbits: No layering sinus fluid. Negative orbits.

Other: Large RIGHT parietal scalp hematoma.

CT CERVICAL SPINE FINDINGS

Alignment: Normal.

Skull base and vertebrae: No acute fracture. No primary bone lesion
or focal pathologic process.

Soft tissues and spinal canal: No prevertebral fluid or swelling. No
visible canal hematoma. BILATERAL carotid bifurcation calcification.

Disc levels:  No traumatic or calcified disc herniation.

Upper chest: Biapical scarring and emphysematous change, greater on
the RIGHT, without progression from March 2018 CT chest.

Other: None.
IMPRESSION: 1. No skull fracture or intracranial hemorrhage. Large RIGHT
parietal scalp hematoma.
2. No cervical spine fracture or traumatic subluxation.

## 2020-10-17 IMAGING — CR RIGHT FEMUR 2 VIEWS
4 series · 4 of 4 positions shown · non-contrast
Comparison: None.

CLINICAL DATA: PER EMS, Pt lives [facility] [place] and fell in the
bathroom and hit the back of her head. Pt also has lac to back of
head . Pt reports pain to RT hip .

EXAM:
RIGHT FEMUR 2 VIEWS

[femur ap (1 of 2)]
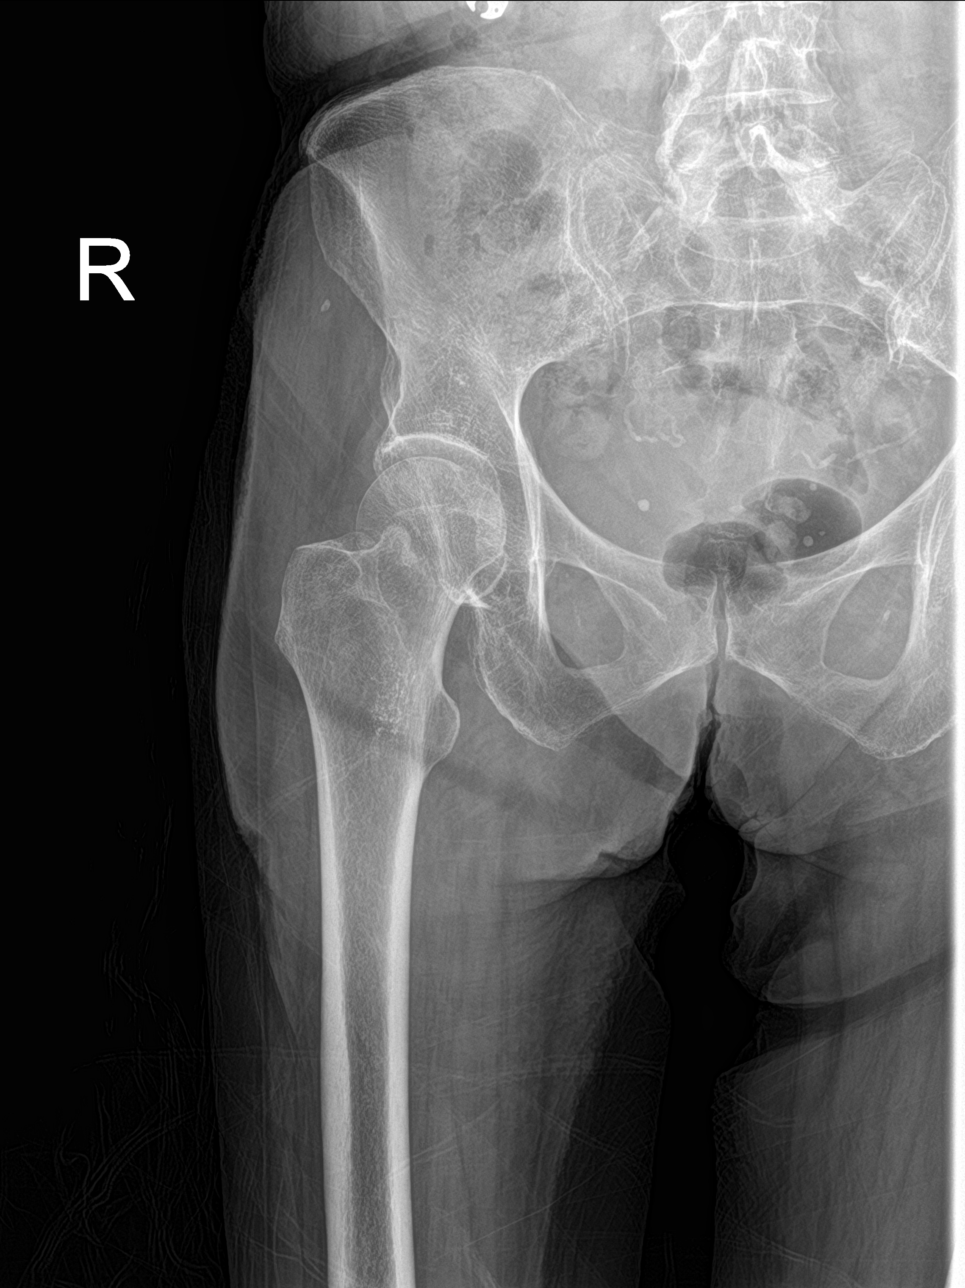

[femur ap (2 of 2)]
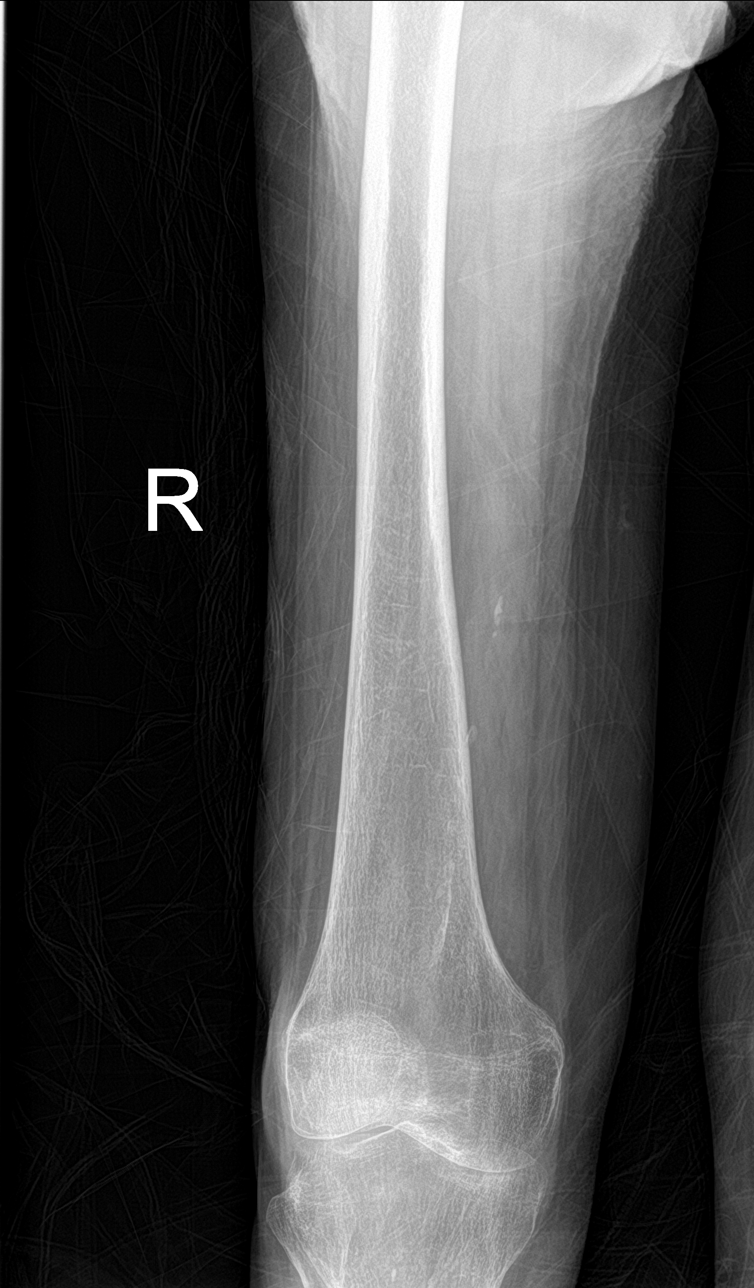

[femur lat (1 of 2)]
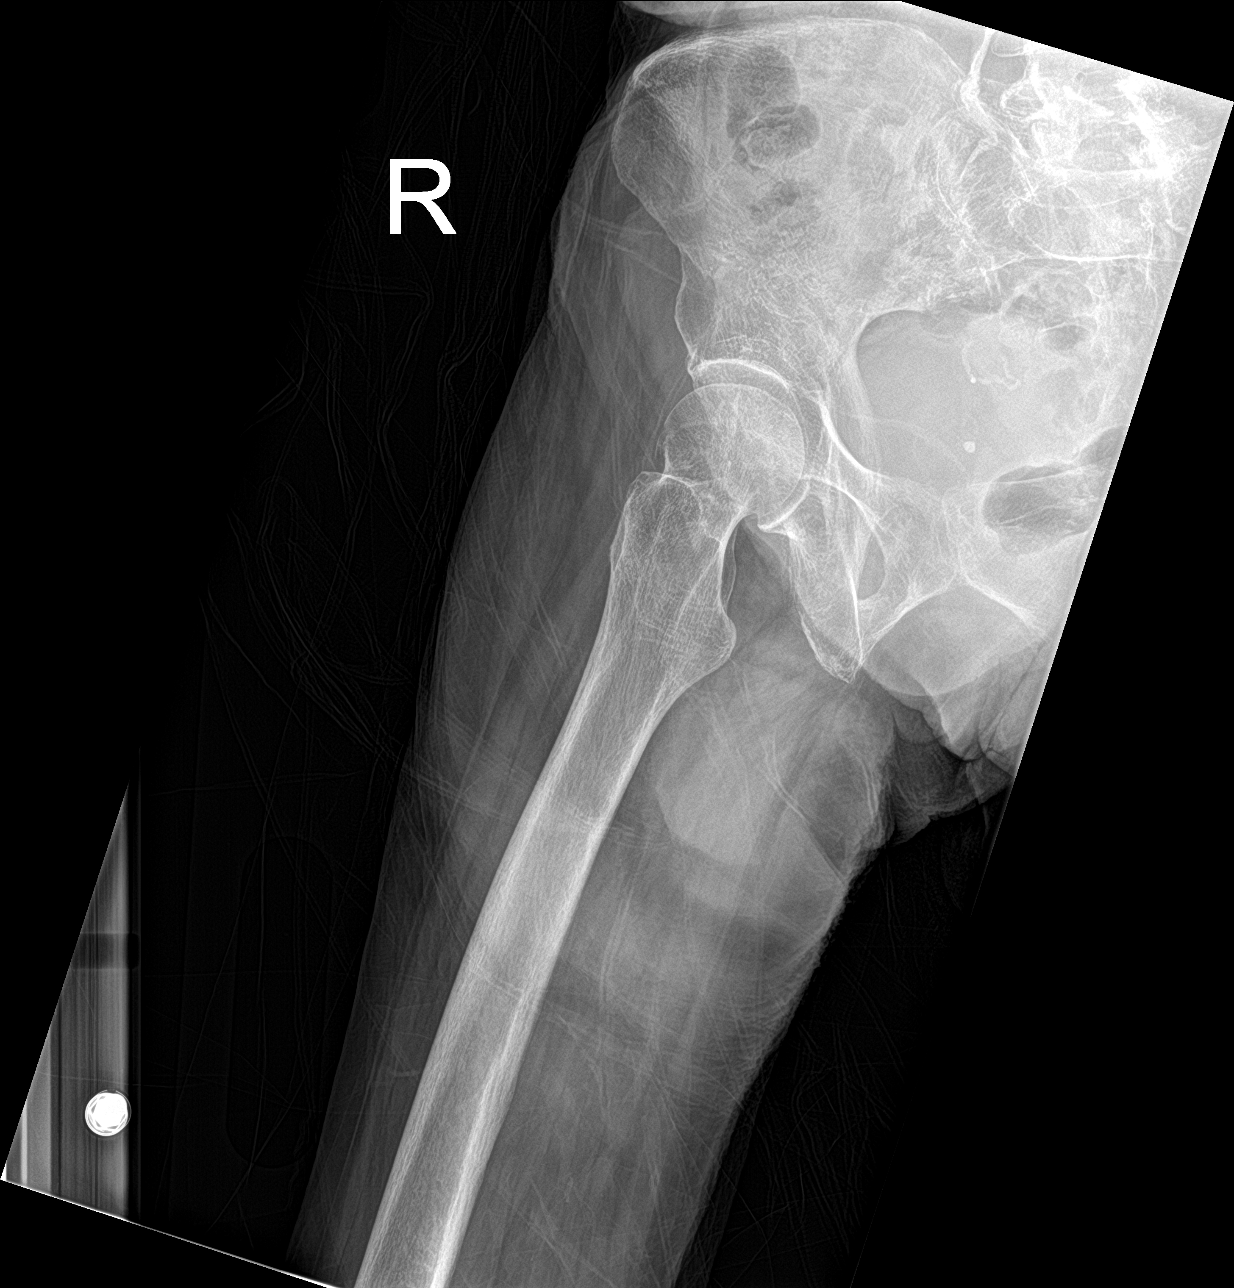

[femur lat (2 of 2)]
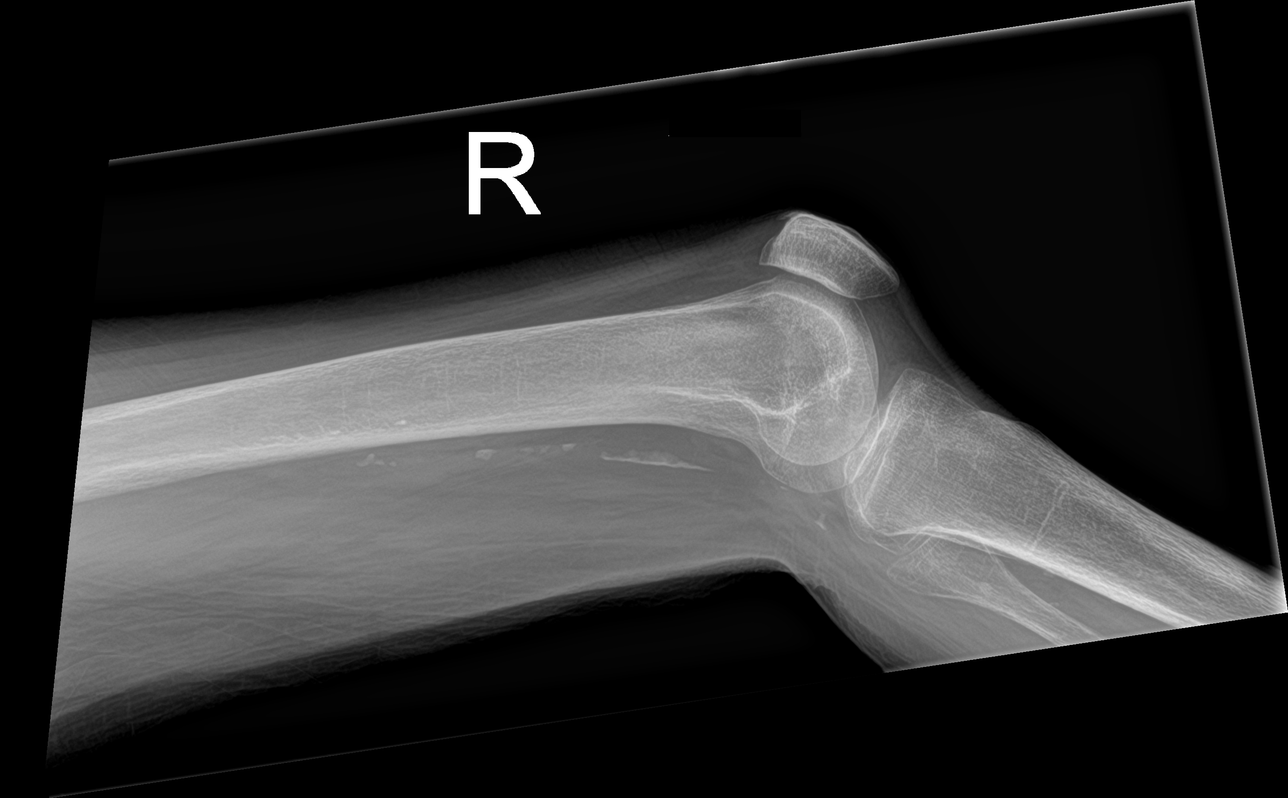

[4 of 4 positions shown; findings below may reference images not displayed]

FINDINGS: Non comminuted fracture of the right femoral neck. No significant
fracture displacement. There is valgus fracture angulation.

No other fractures.  Hip and knee joints are normally aligned.

Soft tissues are unremarkable.
IMPRESSION: 1. Nondisplaced, non comminuted, valgus angulated fracture of the
right femoral neck.
2. No other fractures.  No dislocation.
